# Patient Record
Sex: Male | Born: 1955 | Race: White | Hispanic: No | Smoking: Former smoker
Health system: Southern US, Community
[De-identification: ages and names within clinical notes are randomized; demographics above are authoritative.]

## PROBLEM LIST (undated history)

## (undated) DIAGNOSIS — F419 Anxiety disorder, unspecified: Secondary | ICD-10-CM

## (undated) DIAGNOSIS — R0600 Dyspnea, unspecified: Secondary | ICD-10-CM

## (undated) DIAGNOSIS — I10 Essential (primary) hypertension: Secondary | ICD-10-CM

## (undated) DIAGNOSIS — J449 Chronic obstructive pulmonary disease, unspecified: Secondary | ICD-10-CM

## (undated) DIAGNOSIS — F32A Depression, unspecified: Secondary | ICD-10-CM

## (undated) HISTORY — DX: Essential (primary) hypertension: I10

## (undated) HISTORY — DX: Chronic obstructive pulmonary disease, unspecified: J44.9

## (undated) HISTORY — PX: NO PAST SURGERIES: SHX2092

## (undated) HISTORY — PX: MRI: SHX5353

---

## 2021-05-08 DIAGNOSIS — F1020 Alcohol dependence, uncomplicated: Secondary | ICD-10-CM | POA: Insufficient documentation

## 2021-05-08 DIAGNOSIS — R0600 Dyspnea, unspecified: Secondary | ICD-10-CM | POA: Insufficient documentation

## 2022-01-06 ENCOUNTER — Other Ambulatory Visit: Payer: Self-pay

## 2022-01-06 ENCOUNTER — Emergency Department (HOSPITAL_COMMUNITY)
Admission: EM | Admit: 2022-01-06 | Discharge: 2022-01-06 | Disposition: A | Discharge status: Home / Self Care | Payer: Medicare Other | Attending: Emergency Medicine | Admitting: Emergency Medicine

## 2022-01-06 ENCOUNTER — Emergency Department (HOSPITAL_COMMUNITY): Payer: Medicare Other

## 2022-01-06 ENCOUNTER — Encounter: Payer: Self-pay | Admitting: *Deleted

## 2022-01-06 DIAGNOSIS — F17210 Nicotine dependence, cigarettes, uncomplicated: Secondary | ICD-10-CM | POA: Insufficient documentation

## 2022-01-06 DIAGNOSIS — Z7951 Long term (current) use of inhaled steroids: Secondary | ICD-10-CM | POA: Diagnosis not present

## 2022-01-06 DIAGNOSIS — Z20822 Contact with and (suspected) exposure to covid-19: Secondary | ICD-10-CM | POA: Insufficient documentation

## 2022-01-06 DIAGNOSIS — R0602 Shortness of breath: Secondary | ICD-10-CM | POA: Diagnosis present

## 2022-01-06 DIAGNOSIS — R911 Solitary pulmonary nodule: Secondary | ICD-10-CM | POA: Insufficient documentation

## 2022-01-06 DIAGNOSIS — J441 Chronic obstructive pulmonary disease with (acute) exacerbation: Secondary | ICD-10-CM | POA: Insufficient documentation

## 2022-01-06 LAB — CBC WITH DIFFERENTIAL/PLATELET
Abs Immature Granulocytes: 0.09 10*3/uL — ABNORMAL HIGH (ref 0.00–0.07)
Basophils Absolute: 0 10*3/uL (ref 0.0–0.1)
Basophils Relative: 0 %
Eosinophils Absolute: 0.1 10*3/uL (ref 0.0–0.5)
Eosinophils Relative: 1 %
HCT: 44.5 % (ref 39.0–52.0)
Hemoglobin: 14.3 g/dL (ref 13.0–17.0)
Immature Granulocytes: 1 %
Lymphocytes Relative: 19 %
Lymphs Abs: 2.1 10*3/uL (ref 0.7–4.0)
MCH: 29.7 pg (ref 26.0–34.0)
MCHC: 32.1 g/dL (ref 30.0–36.0)
MCV: 92.5 fL (ref 80.0–100.0)
Monocytes Absolute: 0.7 10*3/uL (ref 0.1–1.0)
Monocytes Relative: 7 %
Neutro Abs: 8.1 10*3/uL — ABNORMAL HIGH (ref 1.7–7.7)
Neutrophils Relative %: 72 %
Platelets: 252 10*3/uL (ref 150–400)
RBC: 4.81 MIL/uL (ref 4.22–5.81)
RDW: 19.5 % — ABNORMAL HIGH (ref 11.5–15.5)
WBC: 11.1 10*3/uL — ABNORMAL HIGH (ref 4.0–10.5)
nRBC: 0 % (ref 0.0–0.2)

## 2022-01-06 LAB — COMPREHENSIVE METABOLIC PANEL
ALT: 26 U/L (ref 0–44)
AST: 51 U/L — ABNORMAL HIGH (ref 15–41)
Albumin: 4 g/dL (ref 3.5–5.0)
Alkaline Phosphatase: 67 U/L (ref 38–126)
Anion gap: 9 (ref 5–15)
BUN: 12 mg/dL (ref 8–23)
CO2: 22 mmol/L (ref 22–32)
Calcium: 8.6 mg/dL — ABNORMAL LOW (ref 8.9–10.3)
Chloride: 101 mmol/L (ref 98–111)
Creatinine, Ser: 0.79 mg/dL (ref 0.61–1.24)
GFR, Estimated: >60 mL/min (ref >60)
Glucose, Bld: 178 mg/dL — ABNORMAL HIGH (ref 70–99)
Potassium: 4.6 mmol/L (ref 3.5–5.1)
Sodium: 132 mmol/L — ABNORMAL LOW (ref 135–145)
Total Bilirubin: 0.7 mg/dL (ref 0.3–1.2)
Total Protein: 7.5 g/dL (ref 6.5–8.1)

## 2022-01-06 LAB — RESP PANEL BY RT-PCR (FLU A&B, COVID) ARPGX2
Influenza A by PCR: NEGATIVE
Influenza B by PCR: NEGATIVE
SARS Coronavirus 2 by RT PCR: NEGATIVE

## 2022-01-06 LAB — D-DIMER, QUANTITATIVE: D-Dimer, Quant: 1.1 ug/mL-FEU — ABNORMAL HIGH (ref 0.00–0.50)

## 2022-01-06 MED ORDER — PREDNISONE 10 MG PO TABS: 40.0000 mg | ORAL_TABLET | Freq: Every day | ORAL | 0 refills | Status: AC

## 2022-01-06 MED ORDER — PREDNISONE 10 MG PO TABS: 50.0000 mg | ORAL_TABLET | Freq: Every day | ORAL | 0 refills | Status: DC

## 2022-01-06 MED ORDER — IOHEXOL 350 MG/ML SOLN
70.0000 mL | Freq: Once | INTRAVENOUS | Status: AC | PRN
  Administered 2022-01-06: 70 mL via INTRAVENOUS

## 2022-01-06 MED ORDER — IPRATROPIUM-ALBUTEROL 0.5-2.5 (3) MG/3ML IN SOLN: 3.0000 mL | Freq: Once | RESPIRATORY_TRACT | Status: DC

## 2022-01-06 NOTE — ED Triage Notes (Signed)
Reports SOB for past 2 hrs. No relief from home breathing treatments. Tachypnea 40-50/min with EMS. Improves with PTA treatment, neb, solumedrol, mag.

## 2022-01-06 NOTE — ED Notes (Signed)
Pt transitioned from bipap to 3L [place] successfully. Pt able to speak in full sentences and denies distress.   Pt accompanied by RN to CT and back to room without incident.

## 2022-01-06 NOTE — ED Notes (Signed)
Daughter type [place] [phone] would like an update

## 2022-01-06 NOTE — Discharge Instructions (Addendum)
I am prescribing you a strong steroid medication called prednisone.  Please only take this as prescribed.  Please take it early in the morning, as this medication can be stimulating and make it difficult to sleep at night.  We saw a new pulmonary nodule on your CT scan.  You need to follow-up with oncology regarding this for reevaluation and further testing.  Below is the contact information for Dr. Alvy Bimler with our oncology team.  Please give them a call immediately and schedule an appointment for reevaluation.  Please follow-up with your regular doctor regarding your visit today.  If you develop any new or worsening symptoms please come back to the emergency department immediately.

## 2022-01-06 NOTE — ED Provider Notes (Signed)
[facility] EMERGENCY DEPARTMENT Provider Note   CSN: [id number] Arrival date & time: 01/06/22  4496     History  Chief Complaint  Patient presents with   Shortness of Breath    Mark Yu is a 66 y.o. male.  HPI Patient is a 66 year old male with a history of COPD who presents to the emergency department due to shortness of breath.  Patient states that he has been experiencing increasing shortness of breath over the past 2 days.  Tonight it began to worsen so he called EMS.  Denies any chest pain or abdominal pain.  No cough, rhinorrhea, sore throat, nausea, vomiting, diarrhea.  States that he still smokes about 2 to 3 cigarettes/day.  Per EMS, patient was desaturating to about 90% on room air.  He was treated with multiple nebulizers, Solu-Medrol, as well as IV magnesium.  Symptoms have improved mildly.    Home Medications Prior to Admission medications   Medication Sig Start Date End Date Taking? Authorizing Provider  albuterol (VENTOLIN HFA) 108 (90 Base) MCG/ACT inhaler Inhale 2 puffs into the lungs every 4 (four) hours as needed for wheezing or shortness of breath.   Yes [provider]  amLODipine (NORVASC) 5 MG tablet Take 5 mg by mouth daily. 12/28/21  Yes [provider]  BREZTRI AEROSPHERE 160-9-4.8 MCG/ACT AERO Inhale 2 puffs into the lungs in the morning and at bedtime. 01/02/22  Yes [provider]  Camphor-Menthol-Methyl Sal (SALONPAS EX) Apply 1 patch topically as needed (rib pain).   Yes [provider]  cholecalciferol (VITAMIN D) 25 MCG (1000 UNIT) tablet Take 1,000 Units by mouth daily.   Yes [provider]  Dextromethorphan-guaiFENesin (MUCINEX DM PO) Take 1 tablet by mouth 2 (two) times daily as needed (cough).   Yes [provider]  fluticasone (FLONASE) 50 MCG/ACT nasal spray Place 2 sprays into both nostrils daily as needed for allergies or rhinitis.   Yes [provider]  folic  acid (FOLVITE) 759 MCG tablet Take 400 mcg by mouth daily.   Yes [provider]  ibuprofen (ADVIL) 200 MG tablet Take 600 mg by mouth every 6 (six) hours as needed for moderate pain or headache.   Yes [provider]  Magnesium 500 MG TABS Take 500 mg by mouth daily.   Yes [provider]  [place] (OYSTER CALCIUM) 500 MG TABS tablet Take 500 mg of elemental calcium by mouth daily.   Yes [provider]  vitamin B-12 (CYANOCOBALAMIN) 1000 MCG tablet Take 1,000 mcg by mouth daily.   Yes [provider]  predniSONE (DELTASONE) 10 MG tablet Take 4 tablets (40 mg total) by mouth daily with breakfast for 5 days. 01/06/22 01/11/22  Rayna Sexton, PA-C      Allergies    Amoxicillin    Review of Systems   Review of Systems  All other systems reviewed and are negative. Ten systems reviewed and are negative for acute change, except as noted in the HPI.   Physical Exam Updated Vital Signs BP 116/88    Pulse 93    Temp 97.8 F (36.6 C) (Oral)    Resp 14    Ht 5\' 11"  (1.803 m)    Wt 51.7 kg    SpO2 96%    BMI 15.90 kg/m  Physical Exam Vitals and nursing note reviewed.  Constitutional:      General: He is not in acute distress.    Appearance: Normal appearance. He is not  ill-appearing, toxic-appearing or diaphoretic.     Interventions: He is not intubated. HENT:     Head: Normocephalic and atraumatic.     Right Ear: External ear normal.     Left Ear: External ear normal.     Nose: Nose normal.     Mouth/Throat:     Mouth: Mucous membranes are moist.     Pharynx: Oropharynx is clear. No oropharyngeal exudate or posterior oropharyngeal erythema.  Eyes:     Extraocular Movements: Extraocular movements intact.  Cardiovascular:     Rate and Rhythm: Normal rate and regular rhythm.     Pulses: Normal pulses.     Heart sounds: Normal heart sounds. No murmur heard.   No friction rub. No gallop.  Pulmonary:     Effort: Pulmonary effort is normal.  Tachypnea present. No bradypnea, accessory muscle usage or respiratory distress. He is not intubated.     Breath sounds: No stridor. Examination of the right-lower field reveals decreased breath sounds and wheezing. Examination of the left-lower field reveals decreased breath sounds and wheezing. Decreased breath sounds and wheezing present. No rhonchi or rales.  Abdominal:     General: Abdomen is flat.     Palpations: Abdomen is soft.     Tenderness: There is no abdominal tenderness.     Comments: Abdomen is soft and nontender.  Musculoskeletal:        General: Normal range of motion.     Cervical back: Normal range of motion and neck supple. No tenderness.     Right lower leg: No tenderness. No edema.     Left lower leg: No tenderness. No edema.     Comments: No pedal edema.  Skin:    General: Skin is warm and dry.  Neurological:     General: No focal deficit present.     Mental Status: He is alert and oriented to person, place, and time.  Psychiatric:        Mood and Affect: Mood normal.        Behavior: Behavior normal.   ED Results / Procedures / Treatments   Labs (all labs ordered are listed, but only abnormal results are displayed) Labs Reviewed  COMPREHENSIVE METABOLIC PANEL - Abnormal; Notable for the following components:      Result Value   Sodium 132 (*)    Glucose, Bld 178 (*)    Calcium 8.6 (*)    AST 51 (*)    All other components within normal limits  CBC WITH DIFFERENTIAL/PLATELET - Abnormal; Notable for the following components:   WBC 11.1 (*)    RDW 19.5 (*)    Neutro Abs 8.1 (*)    Abs Immature Granulocytes 0.09 (*)    All other components within normal limits  D-DIMER, QUANTITATIVE - Abnormal; Notable for the following components:   D-Dimer, Quant 1.10 (*)    All other components within normal limits  RESP PANEL BY RT-PCR (FLU A&B, COVID) [id number]    EKG EKG Interpretation  Date/Time:  Tuesday January 06 2022 02:28:44 EST Ventricular Rate:  131 PR  Interval:  146 QRS Duration: 93 QT Interval:  349 QTC Calculation: 516 R Axis:   96 Text Interpretation: Sinus tachycardia LAE, consider biatrial enlargement Nonspecific T abnormalities, lateral leads ST elevation, consider anterior injury Prolonged QT interval No prior ECG before today in system. No STEMI Confirmed by Antony Blackbird [id number]) on 01/06/2022 3:15:00 AM  Radiology CT Angio Chest PE W and/or Wo Contrast  Result Date: 01/06/2022 CLINICAL  DATA:  66 year old male with clinical suspicion (high probability) of pulmonary embolism. EXAM: CT ANGIOGRAPHY CHEST WITH CONTRAST TECHNIQUE: Multidetector CT imaging of the chest was performed using the standard protocol during bolus administration of intravenous contrast. Multiplanar CT image reconstructions and MIPs were obtained to evaluate the vascular anatomy. CONTRAST:  80mL OMNIPAQUE IOHEXOL 350 MG/ML SOLN COMPARISON:  No priors. FINDINGS: Cardiovascular: There are no filling defects within the pulmonary arterial tree to suggest pulmonary embolism. Heart size is normal. There is no significant pericardial fluid, thickening or pericardial calcification. There is aortic atherosclerosis, as well as atherosclerosis of the great vessels of the mediastinum and the coronary arteries, including calcified atherosclerotic plaque in the left anterior descending, left circumflex and right coronary arteries. Mediastinum/Nodes: Mildly enlarged left hilar lymph node measuring up to 1.5 cm in short axis (axial image 73 of series 7). No other definite right hilar or mediastinal lymphadenopathy is noted. Esophagus is unremarkable in appearance. No axillary lymphadenopathy. Lungs/Pleura: Irregularly-shaped macrolobulated left upper lobe pulmonary nodule measuring up to 2.2 x 1.3 x 0.9 cm (axial image 72 of series 8 and coronal image 48 of series 10), concerning for neoplasm. A smoothly marginated ovoid shaped nodule in the medial aspect of the right upper lobe (axial image  52 of series 8) measuring 9 x 8 mm is also noted. No acute consolidative airspace disease. No pleural effusions. Diffuse bronchial wall thickening with moderate centrilobular and paraseptal emphysema. Upper Abdomen: Aortic atherosclerosis. Musculoskeletal: There are no aggressive appearing lytic or blastic lesions noted in the visualized portions of the skeleton. Review of the MIP images confirms the above findings. IMPRESSION: 1. No evidence of pulmonary embolism. 2. Pulmonary nodules in the lungs bilaterally, most concerning of which is a left upper lobe pulmonary nodule measuring 2.2 x 1.3 x 0.9 cm associated with left hilar lymphadenopathy. Findings are concerning for probable neoplasm. Further evaluation with nonemergent PET-CT is recommended in the near future to provide additional diagnostic and staging information. 3. Aortic atherosclerosis, in addition to three-vessel coronary artery disease. Please note that although the presence of coronary artery calcium documents the presence of coronary artery disease, the severity of this disease and any potential stenosis cannot be assessed on this non-gated CT examination. Assessment for potential risk factor modification, dietary therapy or pharmacologic therapy may be warranted, if clinically indicated. 4. Diffuse bronchial wall thickening with moderate centrilobular and paraseptal emphysema; imaging findings suggestive of underlying COPD. Aortic Atherosclerosis (ICD10-I70.0) and Emphysema (ICD10-J43.9). Electronically Signed   By: Vinnie Langton M.D.   On: 01/06/2022 05:14   DG Chest Portable 1 View  Result Date: 01/06/2022 CLINICAL DATA:  Shortness of breath. EXAM: PORTABLE CHEST 1 VIEW COMPARISON:  None. FINDINGS: The lungs are moderately emphysematous but clear. No pleural effusion is evident. Heart size and vasculature are normal apart from calcifications in the aortic arch. The mediastinum is normally outlined. Generalized osteopenia. IMPRESSION: No  evidence of acute chest disease.  COPD.  Aortic atherosclerosis. Electronically Signed   By: Telford Nab M.D.   On: 01/06/2022 03:06    Procedures Procedures   Medications Ordered in ED Medications  ipratropium-albuterol (DUONEB) 0.5-2.5 (3) MG/3ML nebulizer solution 3 mL (3 mLs Nebulization Not Given 01/06/22 0617)  iohexol (OMNIPAQUE) 350 MG/ML injection 70 mL (70 mLs Intravenous Contrast Given 01/06/22 0439)   ED Course/ Medical Decision Making/ A&P Clinical Course as of 01/06/22 1610  Tue Jan 06, 2022  0319 Patient reports improvement in his symptoms with BiPAP.  Oxygen saturations at 100%.  Wheezing appears to improved.  Patient getting better air movement.  Patient with persistent tachycardia.  Likely due to his recent breathing treatments but given his hypoxia as well as tachycardia, will obtain a D-dimer.  Discussed with the patient and he is amenable. [LJ]  [id number] Patient taken off BiPAP.  Oxygen saturations in the high 90s on 3 L via nasal cannula.  [LJ]    Clinical Course User Index [LJ] Rayna Sexton, PA-C                           Medical Decision Making Pt is a 66 y.o. male with a history of COPD presents to the emergency department due to shortness of breath.  Labs: CBC with a white count of 11.1, RDW 19.5, neutrophils of 8.1. CMP with sodium 132, glucose 178, calcium of 8.6, AST of 51. Respiratory panel is negative. D-dimer 1.1.  Imaging: CT of the chest obtained with findings showing  IMPRESSION: 1. No evidence of pulmonary embolism. 2. Pulmonary nodules in the lungs bilaterally, most concerning of which is a left upper lobe pulmonary nodule measuring 2.2 x 1.3 x 0.9 cm associated with left hilar lymphadenopathy. Findings are concerning for probable neoplasm. Further evaluation with nonemergent PET-CT is recommended in the near future to provide additional diagnostic and staging information. 3. Aortic atherosclerosis, in addition to three-vessel coronary artery  disease. Please note that although the presence of coronary artery calcium documents the presence of coronary artery disease, the severity of this disease and any potential stenosis cannot be assessed on this non-gated CT examination. Assessment for potential risk factor modification, dietary therapy or pharmacologic therapy may be warranted, if clinically indicated. 4. Diffuse bronchial wall thickening with moderate centrilobular and paraseptal emphysema; imaging findings suggestive of underlying COPD. Aortic Atherosclerosis (ICD10-I70.0) and Emphysema (ICD10-J43.9). Electronically Signed   By: Vinnie Langton M.D.   On: 01/06/2022 05:14  Chest x-ray shows no evidence of acute chest disease.  COPD.  Aortic atherosclerosis.  I, Rayna Sexton, PA-C, personally reviewed and evaluated these images and lab results as part of my medical decision-making.  Patient given IV magnesium, Solu-Medrol, as well as multiple duo nebs in route.  Patient had moderate improvement in his symptoms.  He was briefly transitioned to BiPAP with improvement in his breathing.  He was then transitioned off BiPAP back to nasal cannula and has since been taken off oxygen.  Patient persistently tachycardic so D-dimer was obtained which was elevated at 1.1.  I then ordered a CTA of the chest with findings as noted above.  This is negative for PE.  This is also concerning for multiple pulmonary nodules with a notable pulmonary nodule in the left upper lobe.  There is surrounding hilar lymphadenopathy and is concerning for a probable neoplasm.  This was discussed with the patient at length.  Although he has cut back on his smoking he is currently still smoking about 1 pack/week.  He notes a significant worsening in his shortness of breath over the past year.  Reports a 6 pound weight loss in the past 3 months.  Patient given a referral to oncology and I recommended that he reach out to them as soon as possible to schedule an appointment for  reevaluation.  He verbalized understanding.  Patient ambulated with a pulse ox and saturations remained above 95%.  He notes significant improvement in his symptoms.  Feel that he is stable for discharge at this time and he is  agreeable.  Will discharge on a prednisone burst.  He was given very strict return precautions and knows to return to the emergency department with any new or worsening symptoms.  His questions were answered and he was amicable at the time of discharge.  Note: Portions of this report may have been transcribed using voice recognition software. Every effort was made to ensure accuracy; however, inadvertent computerized transcription errors may be present.   Final Clinical Impression(s) / ED Diagnoses Final diagnoses:  COPD exacerbation ([place])  Pulmonary nodule   Rx / DC Orders ED Discharge Orders          Ordered    predniSONE (DELTASONE) 10 MG tablet  Daily with breakfast,   Status:  Discontinued        01/06/22 0618    predniSONE (DELTASONE) 10 MG tablet  Daily with breakfast        01/06/22 0619              Rayna Sexton, PA-C 01/06/22 4383    Tegeler, Gwenyth Allegra, MD 01/06/22 [phone]

## 2022-01-06 NOTE — ED Notes (Signed)
Ambulated patient down hallway with O2 sats averaging 96% during activity.

## 2022-01-07 ENCOUNTER — Telehealth: Payer: Self-pay | Admitting: Internal Medicine

## 2022-01-07 NOTE — Telephone Encounter (Signed)
Spoke to patient to confirm [facility] appointment for 1/12

## 2022-01-08 ENCOUNTER — Other Ambulatory Visit: Payer: Self-pay | Admitting: *Deleted

## 2022-01-08 ENCOUNTER — Encounter: Payer: Self-pay | Admitting: Internal Medicine

## 2022-01-08 ENCOUNTER — Inpatient Hospital Stay (HOSPITAL_BASED_OUTPATIENT_CLINIC_OR_DEPARTMENT_OTHER): Payer: Medicare Other | Admitting: Internal Medicine

## 2022-01-08 ENCOUNTER — Other Ambulatory Visit: Payer: Self-pay

## 2022-01-08 ENCOUNTER — Inpatient Hospital Stay: Payer: Medicare Other | Attending: Internal Medicine

## 2022-01-08 VITALS — BP 136/96 | HR 109 | Temp 97.5°F | Resp 19 | Ht 71.0 in | Wt 111.8 lb

## 2022-01-08 DIAGNOSIS — C3490 Malignant neoplasm of unspecified part of unspecified bronchus or lung: Secondary | ICD-10-CM

## 2022-01-08 DIAGNOSIS — Z79899 Other long term (current) drug therapy: Secondary | ICD-10-CM | POA: Diagnosis not present

## 2022-01-08 DIAGNOSIS — F17210 Nicotine dependence, cigarettes, uncomplicated: Secondary | ICD-10-CM

## 2022-01-08 DIAGNOSIS — R634 Abnormal weight loss: Secondary | ICD-10-CM

## 2022-01-08 DIAGNOSIS — Z7952 Long term (current) use of systemic steroids: Secondary | ICD-10-CM | POA: Insufficient documentation

## 2022-01-08 DIAGNOSIS — F419 Anxiety disorder, unspecified: Secondary | ICD-10-CM

## 2022-01-08 DIAGNOSIS — R918 Other nonspecific abnormal finding of lung field: Secondary | ICD-10-CM

## 2022-01-08 DIAGNOSIS — I10 Essential (primary) hypertension: Secondary | ICD-10-CM | POA: Diagnosis not present

## 2022-01-08 DIAGNOSIS — R911 Solitary pulmonary nodule: Secondary | ICD-10-CM

## 2022-01-08 DIAGNOSIS — C3411 Malignant neoplasm of upper lobe, right bronchus or lung: Secondary | ICD-10-CM | POA: Diagnosis present

## 2022-01-08 LAB — CBC WITH DIFFERENTIAL (CANCER CENTER ONLY)
Abs Immature Granulocytes: 0.02 10*3/uL (ref 0.00–0.07)
Basophils Absolute: 0 10*3/uL (ref 0.0–0.1)
Basophils Relative: 0 %
Eosinophils Absolute: 0.1 10*3/uL (ref 0.0–0.5)
Eosinophils Relative: 1 %
HCT: 38.8 % — ABNORMAL LOW (ref 39.0–52.0)
Hemoglobin: 12.8 g/dL — ABNORMAL LOW (ref 13.0–17.0)
Immature Granulocytes: 1 %
Lymphocytes Relative: 17 %
Lymphs Abs: 0.7 10*3/uL (ref 0.7–4.0)
MCH: 29 pg (ref 26.0–34.0)
MCHC: 33 g/dL (ref 30.0–36.0)
MCV: 88 fL (ref 80.0–100.0)
Monocytes Absolute: 0.3 10*3/uL (ref 0.1–1.0)
Monocytes Relative: 7 %
Neutro Abs: 2.9 10*3/uL (ref 1.7–7.7)
Neutrophils Relative %: 74 %
Platelet Count: 271 10*3/uL (ref 150–400)
RBC: 4.41 MIL/uL (ref 4.22–5.81)
RDW: 19.3 % — ABNORMAL HIGH (ref 11.5–15.5)
WBC Count: 3.9 10*3/uL — ABNORMAL LOW (ref 4.0–10.5)
nRBC: 0 % (ref 0.0–0.2)

## 2022-01-08 LAB — CMP (CANCER CENTER ONLY)
ALT: 21 U/L (ref 0–44)
AST: 27 U/L (ref 15–41)
Albumin: 4.4 g/dL (ref 3.5–5.0)
Alkaline Phosphatase: 56 U/L (ref 38–126)
Anion gap: 9 (ref 5–15)
BUN: 19 mg/dL (ref 8–23)
CO2: 27 mmol/L (ref 22–32)
Calcium: 9.2 mg/dL (ref 8.9–10.3)
Chloride: 98 mmol/L (ref 98–111)
Creatinine: 0.79 mg/dL (ref 0.61–1.24)
GFR, Estimated: >60 mL/min (ref >60)
Glucose, Bld: 99 mg/dL (ref 70–99)
Potassium: 4.7 mmol/L (ref 3.5–5.1)
Sodium: 134 mmol/L — ABNORMAL LOW (ref 135–145)
Total Bilirubin: 0.6 mg/dL (ref 0.3–1.2)
Total Protein: 7.6 g/dL (ref 6.5–8.1)

## 2022-01-08 MED ORDER — METHYLPREDNISOLONE 4 MG PO TBPK: ORAL_TABLET | ORAL | 0 refills | Status: DC

## 2022-01-08 MED ORDER — ALPRAZOLAM 0.25 MG PO TABS: 0.2500 mg | ORAL_TABLET | Freq: Every day | ORAL | 0 refills | Status: DC

## 2022-01-08 NOTE — Progress Notes (Signed)
[place] [phone]   [phone]  CONSULT NOTE  REFERRING PHYSICIAN: Rayna Sexton, PA-C  REASON FOR CONSULTATION:  66 years old white male with highly suspicious lung cancer  HPI Mark Yu is a 66 y.o. male with past medical history significant for COPD and hypertension.  The patient also has a long history of smoking.  He has been followed by primary care physician for management of his COPD and treated with different inhalers.  Over the last few weeks he has a problem with shortness of breath as well as inability to clear his thick mucus.  He presented to the emergency department on January 06, 2022 complaining of shortness of breath and chest tightness.  Had a chest x-ray followed by CT angiogram of the chest on January 06, 2022 and it showed irregularly shaped macrolobulated left upper lobe pulmonary nodule measuring 2.2 x 1.3 x 0.9 cm concerning for neoplasm.  There was also a smoothly marginated ovoid shaped nodule in the medial aspect of the right upper lobe measuring 0.9 x 0.8 cm the scan also showed mildly enlarged left hilar lymph node measuring up to 1.5 cm and no other definite right hilar or mediastinal lymphadenopathy. The patient was referred to me today for evaluation and recommendation regarding treatment of his condition.  He continues to complain of lack of appetite as well as lack of taste and few pounds of weight loss over the last 2 months.  He has cough productive of thick mucus as well as shortness of breath but no significant chest pain or hemoptysis.  He has no nausea, vomiting, diarrhea or constipation.  He has no headache or visual changes. Family history significant for mother with Alzheimer's and father with MS. The patient is single and has a significant other.  He works as Scientist, research (medical) at [employer].  He was accompanied today by the daughter of his girlfriend Mark Yu, a registered nurse at  [employer].  The patient has a history of smoking up to 1.5 pack/day for around 50 years.  He still smokes few cigarettes every day.  He also drinks around 4 beers every day.  He has history of marijuana use.  HPI  Past Medical History:  Diagnosis Date   COPD (chronic obstructive pulmonary disease) (HCC)    HTN (hypertension)     History reviewed. No pertinent surgical history.  Family History  Problem Relation Age of Onset   Alzheimer's disease Mother    Multiple sclerosis Father     Social History    Allergies  Allergen Reactions   Amoxicillin Rash    Current Outpatient Medications  Medication Sig Dispense Refill   albuterol (VENTOLIN HFA) 108 (90 Base) MCG/ACT inhaler Inhale 2 puffs into the lungs every 4 (four) hours as needed for wheezing or shortness of breath.     amLODipine (NORVASC) 5 MG tablet Take 5 mg by mouth daily.     BREZTRI AEROSPHERE 160-9-4.8 MCG/ACT AERO Inhale 2 puffs into the lungs in the morning and at bedtime.     Camphor-Menthol-Methyl Sal (SALONPAS EX) Apply 1 patch topically as needed (rib pain).     cholecalciferol (VITAMIN D) 25 MCG (1000 UNIT) tablet Take 1,000 Units by mouth daily.     Dextromethorphan-guaiFENesin (MUCINEX DM PO) Take 1 tablet by mouth 2 (two) times daily as needed (cough).     fluticasone (FLONASE) 50 MCG/ACT nasal spray Place 2 sprays into both nostrils daily  as needed for allergies or rhinitis.     folic acid (FOLVITE) 443 MCG tablet Take 400 mcg by mouth daily.     ibuprofen (ADVIL) 200 MG tablet Take 600 mg by mouth every 6 (six) hours as needed for moderate pain or headache.     Magnesium 500 MG TABS Take 500 mg by mouth daily.     Oyster Shell (OYSTER CALCIUM) 500 MG TABS tablet Take 500 mg of elemental calcium by mouth daily.     predniSONE (DELTASONE) 10 MG tablet Take 4 tablets (40 mg total) by mouth daily with breakfast for 5 days. 20 tablet 0   vitamin B-12 (CYANOCOBALAMIN) 1000 MCG tablet Take 1,000 mcg by  mouth daily.     No current facility-administered medications for this visit.    Review of Systems  Constitutional: positive for anorexia, fatigue, and weight loss Eyes: negative Ears, nose, mouth, throat, and face: negative Respiratory: positive for cough and dyspnea on exertion Cardiovascular: negative Gastrointestinal: negative Genitourinary:negative Integument/breast: negative Hematologic/lymphatic: negative Musculoskeletal:negative Neurological: positive for headaches Behavioral/Psych: positive for anxiety Endocrine: negative Allergic/Immunologic: negative  Physical Exam  XVQ:MGQQP, healthy, no distress, well nourished, well developed, and anxious SKIN: skin color, texture, turgor are normal, no rashes or significant lesions HEAD: Normocephalic, No masses, lesions, tenderness or abnormalities EYES: normal, PERRLA, Conjunctiva are pink and non-injected EARS: External ears normal, Canals clear OROPHARYNX:no exudate, no erythema, and lips, buccal mucosa, and tongue normal  NECK: supple, no adenopathy, no JVD LYMPH:  no palpable lymphadenopathy, no hepatosplenomegaly LUNGS: expiratory wheezes bilaterally HEART: regular rate & rhythm, no murmurs, and no gallops ABDOMEN:abdomen soft, non-tender, and normal bowel sounds BACK: Back symmetric, no curvature., No CVA tenderness EXTREMITIES:no joint deformities, effusion, or inflammation, no edema  NEURO: alert & oriented x 3 with fluent speech, no focal motor/sensory deficits  PERFORMANCE STATUS: ECOG 1  LABORATORY DATA: Lab Results  Component Value Date   WBC 3.9 (L) 01/08/2022   HGB 12.8 (L) 01/08/2022   HCT 38.8 (L) 01/08/2022   MCV 88.0 01/08/2022   PLT 271 01/08/2022      Chemistry      Component Value Date/Time   NA 132 (L) 01/06/2022 0243   K 4.6 01/06/2022 0243   CL 101 01/06/2022 0243   CO2 22 01/06/2022 0243   BUN 12 01/06/2022 0243   CREATININE 0.79 01/06/2022 0243      Component Value Date/Time    CALCIUM 8.6 (L) 01/06/2022 0243   ALKPHOS 67 01/06/2022 0243   AST 51 (H) 01/06/2022 0243   ALT 26 01/06/2022 0243   BILITOT 0.7 01/06/2022 0243       RADIOGRAPHIC STUDIES: CT Angio Chest PE W and/or Wo Contrast  Result Date: 01/06/2022 CLINICAL DATA:  66 year old male with clinical suspicion (high probability) of pulmonary embolism. EXAM: CT ANGIOGRAPHY CHEST WITH CONTRAST TECHNIQUE: Multidetector CT imaging of the chest was performed using the standard protocol during bolus administration of intravenous contrast. Multiplanar CT image reconstructions and MIPs were obtained to evaluate the vascular anatomy. CONTRAST:  49mL OMNIPAQUE IOHEXOL 350 MG/ML SOLN COMPARISON:  No priors. FINDINGS: Cardiovascular: There are no filling defects within the pulmonary arterial tree to suggest pulmonary embolism. Heart size is normal. There is no significant pericardial fluid, thickening or pericardial calcification. There is aortic atherosclerosis, as well as atherosclerosis of the great vessels of the mediastinum and the coronary arteries, including calcified atherosclerotic plaque in the left anterior descending, left circumflex and right coronary arteries. Mediastinum/Nodes: Mildly enlarged left hilar lymph node  measuring up to 1.5 cm in short axis (axial image 73 of series 7). No other definite right hilar or mediastinal lymphadenopathy is noted. Esophagus is unremarkable in appearance. No axillary lymphadenopathy. Lungs/Pleura: Irregularly-shaped macrolobulated left upper lobe pulmonary nodule measuring up to 2.2 x 1.3 x 0.9 cm (axial image 72 of series 8 and coronal image 48 of series 10), concerning for neoplasm. A smoothly marginated ovoid shaped nodule in the medial aspect of the right upper lobe (axial image 52 of series 8) measuring 9 x 8 mm is also noted. No acute consolidative airspace disease. No pleural effusions. Diffuse bronchial wall thickening with moderate centrilobular and paraseptal emphysema.  Upper Abdomen: Aortic atherosclerosis. Musculoskeletal: There are no aggressive appearing lytic or blastic lesions noted in the visualized portions of the skeleton. Review of the MIP images confirms the above findings. IMPRESSION: 1. No evidence of pulmonary embolism. 2. Pulmonary nodules in the lungs bilaterally, most concerning of which is a left upper lobe pulmonary nodule measuring 2.2 x 1.3 x 0.9 cm associated with left hilar lymphadenopathy. Findings are concerning for probable neoplasm. Further evaluation with nonemergent PET-CT is recommended in the near future to provide additional diagnostic and staging information. 3. Aortic atherosclerosis, in addition to three-vessel coronary artery disease. Please note that although the presence of coronary artery calcium documents the presence of coronary artery disease, the severity of this disease and any potential stenosis cannot be assessed on this non-gated CT examination. Assessment for potential risk factor modification, dietary therapy or pharmacologic therapy may be warranted, if clinically indicated. 4. Diffuse bronchial wall thickening with moderate centrilobular and paraseptal emphysema; imaging findings suggestive of underlying COPD. Aortic Atherosclerosis (ICD10-I70.0) and Emphysema (ICD10-J43.9). Electronically Signed   By: Vinnie Langton M.D.   On: 01/06/2022 05:14   DG Chest Portable 1 View  Result Date: 01/06/2022 CLINICAL DATA:  Shortness of breath. EXAM: PORTABLE CHEST 1 VIEW COMPARISON:  None. FINDINGS: The lungs are moderately emphysematous but clear. No pleural effusion is evident. Heart size and vasculature are normal apart from calcifications in the aortic arch. The mediastinum is normally outlined. Generalized osteopenia. IMPRESSION: No evidence of acute chest disease.  COPD.  Aortic atherosclerosis. Electronically Signed   By: Telford Nab M.D.   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   PLAN: I had a lengthy discussion with the patient and his family member today about his current condition and further investigation to confirm his diagnosis. I recommended for the patient to have a PET scan as well as MRI of the brain to rule out brain metastasis. I will refer the patient to pulmonary medicine for consideration of bronchoscopy with EBUS and biopsy of the left upper lobe as well as the right upper lobe lung nodules for confirmation of tissue diagnosis. I strongly encouraged the patient to quit smoking. I will see him back for follow-up visit in around 2-3 weeks for evaluation and more detailed discussion of his treatment options based on the final pathology and staging work-up. For the anxiety, I gave the patient 10 tablets of Xanax 0.  2 5 mg to be used only on as-needed basis. For the lack of appetite and weight loss, I gave him Medrol Dosepak. The patient was advised to call immediately if he has  any other concerning symptoms in the interval The patient voices understanding of current disease status and treatment options and is in agreement with the current care plan.  All questions were answered. The patient knows to call the clinic with any problems, questions or concerns. We can certainly see the patient much sooner if necessary.  Thank you so much for allowing me to participate in the care of Mark Yu. I will continue to follow up the patient with you and assist in his care.  The total time spent in the appointment was 60 minutes.  Disclaimer: This note was dictated with voice recognition software. Similar sounding words can inadvertently be transcribed and may not be corrected upon review.   Eilleen Kempf January 08, 2022, 2:53 PM

## 2022-01-08 NOTE — Progress Notes (Signed)
The proposed treatment discussed in conference is for discussion purpose only and is not a binding recommendation. The patient was not been physically examined, or presented with their treatment options. Therefore, final treatment plans cannot be decided.  

## 2022-01-08 NOTE — Patient Instructions (Signed)
Steps to Quit Smoking Smoking tobacco is the leading cause of preventable death. It can affect almost every organ in the body. Smoking puts you and people around you at risk for many serious, long-lasting (chronic) diseases. Quitting smoking can be hard, but it is one of the best things that you can do for your health. It is never too late to quit. How do I get ready to quit? When you decide to quit smoking, make a plan to help you succeed. Before you quit: Pick a date to quit. Set a date within the next 2 weeks to give you time to prepare. Write down the reasons why you are quitting. Keep this list in places where you will see it often. Tell your family, friends, and co-workers that you are quitting. Their support is important. Talk with your doctor about the choices that may help you quit. Find out if your health insurance will pay for these treatments. Know the people, places, things, and activities that make you want to smoke (triggers). Avoid them. What first steps can I take to quit smoking? Throw away all cigarettes at home, at work, and in your car. Throw away the things that you use when you smoke, such as ashtrays and lighters. Clean your car. Make sure to empty the ashtray. Clean your home, including curtains and carpets. What can I do to help me quit smoking? Talk with your doctor about taking medicines and seeing a counselor at the same time. You are more likely to succeed when you do both. If you are pregnant or breastfeeding, talk with your doctor about counseling or other ways to quit smoking. Do not take medicine to help you quit smoking unless your doctor tells you to do so. To quit smoking: Quit right away Quit smoking totally, instead of slowly cutting back on how much you smoke over a period of time. Go to counseling. You are more likely to quit if you go to counseling sessions regularly. Take medicine You may take medicines to help you quit. Some medicines need a  prescription, and some you can buy over-the-counter. Some medicines may contain a drug called nicotine to replace the nicotine in cigarettes. Medicines may: Help you to stop having the desire to smoke (cravings). Help to stop the problems that come when you stop smoking (withdrawal symptoms). Your doctor may ask you to use: Nicotine patches, gum, or lozenges. Nicotine inhalers or sprays. Non-nicotine medicine that is taken by mouth. Find resources Find resources and other ways to help you quit smoking and remain smoke-free after you quit. These resources are most helpful when you use them often. They include: Online chats with a counselor. Phone quitlines. Printed self-help materials. Support groups or group counseling. Text messaging programs. Mobile phone apps. Use apps on your mobile phone or tablet that can help you stick to your quit plan. There are many free apps for mobile phones and tablets as well as websites. Examples include Quit Guide from the CDC and smokefree.gov  What things can I do to make it easier to quit?  Talk to your family and friends. Ask them to support and encourage you. Call a phone quitline (1-800-QUIT-NOW), reach out to support groups, or work with a counselor. Ask people who smoke to not smoke around you. Avoid places that make you want to smoke, such as: Bars. Parties. Smoke-break areas at work. Spend time with people who do not smoke. Lower the stress in your life. Stress can make you want to   smoke. Try these things to help your stress: Getting regular exercise. Doing deep-breathing exercises. Doing yoga. Meditating. Doing a body scan. To do this, close your eyes, focus on one area of your body at a time from head to toe. Notice which parts of your body are tense. Try to relax the muscles in those areas. How will I feel when I quit smoking? Day 1 to 3 weeks Within the first 24 hours, you may start to have some problems that come from quitting tobacco.  These problems are very bad 2-3 days after you quit, but they do not often last for more than 2-3 weeks. You may get these symptoms: Mood swings. Feeling restless, nervous, angry, or annoyed. Trouble concentrating. Dizziness. Strong desire for high-sugar foods and nicotine. Weight gain. Trouble pooping (constipation). Feeling like you may vomit (nausea). Coughing or a sore throat. Changes in how the medicines that you take for other issues work in your body. Depression. Trouble sleeping (insomnia). Week 3 and afterward After the first 2-3 weeks of quitting, you may start to notice more positive results, such as: Better sense of smell and taste. Less coughing and sore throat. Slower heart rate. Lower blood pressure. Clearer skin. Better breathing. Fewer sick days. Quitting smoking can be hard. Do not give up if you fail the first time. Some people need to try a few times before they succeed. Do your best to stick to your quit plan, and talk with your doctor if you have any questions or concerns. Summary Smoking tobacco is the leading cause of preventable death. Quitting smoking can be hard, but it is one of the best things that you can do for your health. When you decide to quit smoking, make a plan to help you succeed. Quit smoking right away, not slowly over a period of time. When you start quitting, seek help from your doctor, family, or friends. This information is not intended to replace advice given to you by your health care provider. Make sure you discuss any questions you have with your health care provider. Document Revised: 08/22/2021 Document Reviewed: 03/04/2019 Elsevier Patient Education  2022 Elsevier Inc.  

## 2022-01-09 ENCOUNTER — Encounter: Payer: Self-pay | Admitting: *Deleted

## 2022-01-09 NOTE — Progress Notes (Signed)
Oncology Nurse Navigator Documentation  Oncology Nurse Navigator Flowsheets 01/09/2022 01/06/2022  Abnormal Finding Date - 01/05/2022  Diagnosis Status - Additional Work Up  Navigator Follow Up Date: 01/12/2022 01/08/2022  Navigator Follow Up Reason: Appointment Review New Patient Appointment  Navigator Location [place]  Referral Date to RadOnc/MedOnc - 01/06/2022  Navigator Encounter Type Other: Telephone  Telephone - [place] Call  [facility] Date 01/08/2022 -  [facility] Type Thoracic -  Patient Visit Type Other Initial  Treatment Phase Abnormal Scans Abnormal Scans  Barriers/Navigation Needs Coordination of Care/I followed up on  Mark Yu's insurance authorization for his scans. They are still pending at this time. I contacted authorization team to help expedite.  Coordination of Care;Education  Education - Other  Interventions Coordination of Care Coordination of Care;Education;Psycho-Social Support  Acuity Level 2-Minimal Needs (1-2 Barriers Identified) Level 2-Minimal Needs (1-2 Barriers Identified)  Coordination of Care Other Appts  Education Method - Verbal  Time Spent with Patient 15 30

## 2022-01-13 ENCOUNTER — Telehealth: Payer: Self-pay | Admitting: Pulmonary Disease

## 2022-01-13 ENCOUNTER — Encounter: Payer: Self-pay | Admitting: *Deleted

## 2022-01-13 NOTE — Progress Notes (Signed)
Oncology Nurse Navigator Documentation  Oncology Nurse Navigator Flowsheets 01/13/2022 01/09/2022 01/06/2022  Abnormal Finding Date - - 01/05/2022  Diagnosis Status - - Additional Work Up  Navigator Follow Up Date: 01/16/2022 01/12/2022 01/08/2022  Navigator Follow Up Reason: Appointment Review Appointment Review New Patient Appointment  Navigator Location [place]  Referral Date to RadOnc/MedOnc - - 01/06/2022  Navigator Encounter Type Other: Other: Telephone  Telephone - - Outgoing Call  [facility] Date - 01/08/2022 -  [facility] Type - Thoracic -  Patient Visit Type - Other Initial  Treatment Phase Abnormal Scans Abnormal Scans Abnormal Scans  Barriers/Navigation Needs Coordination of Care/I followed up on patient appt with pulmonary and did not see an appt. I reached out to Dr. Valeta Harms to get an update. He was great to respond so quickly and will get patient in this week. I am so thankful for his help.  Coordination of Care Coordination of Care;Education  Education - - Other  Interventions Coordination of Care Coordination of Care Coordination of Care;Education;Psycho-Social Support  Acuity Level 2-Minimal Needs (1-2 Barriers Identified) Level 2-Minimal Needs (1-2 Barriers Identified) Level 2-Minimal Needs (1-2 Barriers Identified)  Coordination of Care Other Other Appts  Education Method - - Verbal  Time Spent with Patient 30 15 30

## 2022-01-13 NOTE — Telephone Encounter (Signed)
Left voicemail for patient to call back to schedule with Dr. Valeta Harms on 01/16/2022 at [facility] by Dr. Valeta Harms to double-book.

## 2022-01-13 NOTE — Telephone Encounter (Signed)
-----   Message from Garner Nash, DO sent at 01/13/2022 12:01 PM EST ----- Regarding: RE: new referral  Mel Almond,   Can you please find this referral and get on my schedule or robs? [place] So, please stick him on my schedule for this Friday if the patient can come in on Friday. Ok to double book my 9AM slot   Thanks  BLI     ----- Message ----- From: Valrie Hart, RN Sent: 01/13/2022  11:53 AM EST To: Garner Nash, DO Subject: new referral                                   Hey Dr. Valeta Harms- So sorry to bother you.  Dr. Julien Nordmann made a referral last week and patient has not heard any updates. Is there someone in the office I can reach out to?  Thanks Hinton Dyer

## 2022-01-14 ENCOUNTER — Encounter: Payer: Self-pay | Admitting: *Deleted

## 2022-01-14 NOTE — Progress Notes (Signed)
Oncology Nurse Navigator Documentation  Oncology Nurse Navigator Flowsheets 01/14/2022 01/13/2022 01/09/2022 01/06/2022  Abnormal Finding Date - - - 01/05/2022  Diagnosis Status - - - Additional Work Up  Navigator Follow Up Date: 01/23/2022 01/16/2022 01/12/2022 01/08/2022  Navigator Follow Up Reason: Radiology Appointment Review Appointment Review New Patient Appointment  Navigator Location [place]  Referral Date to RadOnc/MedOnc - - - 01/06/2022  Navigator Encounter Type Other: Other: Other: Telephone  Telephone - - - [place] Call  [facility] Date - - 01/08/2022 -  [facility] Type - - Thoracic -  Patient Visit Type - - Other Initial  Treatment Phase Abnormal Scans Abnormal Scans Abnormal Scans Abnormal Scans  Barriers/Navigation Needs Coordination of Care/I followed up on Mark Yu's schedule for his treatment plan. He is set up at this time.  Coordination of Care Coordination of Care Coordination of Care;Education  Education - - - Other  Interventions Coordination of Care Coordination of Care Coordination of Care Coordination of Care;Education;Psycho-Social Support  Acuity Level 2-Minimal Needs (1-2 Barriers Identified) Level 2-Minimal Needs (1-2 Barriers Identified) Level 2-Minimal Needs (1-2 Barriers Identified) Level 2-Minimal Needs (1-2 Barriers Identified)  Coordination of Care Other Other Other Appts  Education Method - - - Verbal  Time Spent with Patient 15 30 15  30

## 2022-01-14 NOTE — Telephone Encounter (Signed)
Patient scheduled with Dr. Lamonte Sakai on 01/22/2022.

## 2022-01-20 ENCOUNTER — Ambulatory Visit (HOSPITAL_COMMUNITY)
Admission: RE | Admit: 2022-01-20 | Discharge: 2022-01-20 | Disposition: A | Discharge status: Home / Self Care | Payer: Medicare Other | Source: Ambulatory Visit | Attending: Internal Medicine | Admitting: Internal Medicine

## 2022-01-20 ENCOUNTER — Other Ambulatory Visit: Payer: Self-pay

## 2022-01-20 DIAGNOSIS — C3490 Malignant neoplasm of unspecified part of unspecified bronchus or lung: Secondary | ICD-10-CM | POA: Insufficient documentation

## 2022-01-20 DIAGNOSIS — J432 Centrilobular emphysema: Secondary | ICD-10-CM | POA: Insufficient documentation

## 2022-01-20 LAB — GLUCOSE, CAPILLARY: Glucose-Capillary: 84 mg/dL (ref 70–99)

## 2022-01-20 MED ORDER — FLUDEOXYGLUCOSE F - 18 (FDG) INJECTION
5.5000 | Freq: Once | INTRAVENOUS | Status: AC | PRN
  Administered 2022-01-20: 14:00:00 6.37 via INTRAVENOUS

## 2022-01-20 MED ORDER — GADOBUTROL 1 MMOL/ML IV SOLN
5.0000 mL | Freq: Once | INTRAVENOUS | Status: AC | PRN
  Administered 2022-01-20: 16:00:00 5 mL via INTRAVENOUS

## 2022-01-22 ENCOUNTER — Encounter: Payer: Self-pay | Admitting: Emergency Medicine

## 2022-01-22 ENCOUNTER — Ambulatory Visit: Payer: Medicare Other | Admitting: Emergency Medicine

## 2022-01-22 ENCOUNTER — Other Ambulatory Visit: Payer: Self-pay

## 2022-01-22 ENCOUNTER — Telehealth: Payer: Self-pay | Admitting: Emergency Medicine

## 2022-01-22 VITALS — BP 142/86 | HR 117 | Temp 98.2°F | Ht 71.0 in | Wt 112.0 lb

## 2022-01-22 DIAGNOSIS — F419 Anxiety disorder, unspecified: Secondary | ICD-10-CM | POA: Diagnosis not present

## 2022-01-22 DIAGNOSIS — Z720 Tobacco use: Secondary | ICD-10-CM

## 2022-01-22 DIAGNOSIS — J449 Chronic obstructive pulmonary disease, unspecified: Secondary | ICD-10-CM | POA: Insufficient documentation

## 2022-01-22 DIAGNOSIS — J441 Chronic obstructive pulmonary disease with (acute) exacerbation: Secondary | ICD-10-CM | POA: Insufficient documentation

## 2022-01-22 DIAGNOSIS — R918 Other nonspecific abnormal finding of lung field: Secondary | ICD-10-CM | POA: Diagnosis not present

## 2022-01-22 MED ORDER — ALPRAZOLAM 1 MG PO TABS: 1.0000 mg | ORAL_TABLET | Freq: Every evening | ORAL | 0 refills | Status: DC | PRN

## 2022-01-22 NOTE — Assessment & Plan Note (Addendum)
Continue your Breztri 2 puffs twice a day.  Rinse and gargle after using. Keep your albuterol available to use 2 puffs when you needed for shortness of breath, chest tightness, wheezing. Follow with Dr Lamonte Sakai in 1 month

## 2022-01-22 NOTE — Assessment & Plan Note (Signed)
Work hard on decreasing your cigarettes.  Ultimate goal will be to stop altogether.

## 2022-01-22 NOTE — Patient Instructions (Signed)
We will work on arranging bronchoscopy to evaluate your pulmonary nodules and your enlarged hilar lymph node.  This will be done under general anesthesia as an outpatient at [facility] endoscopy.  You will need a designated driver.  We will try to get this scheduled for 02/02/2022. Continue your Breztri 2 puffs twice a day.  Rinse and gargle after using. Keep your albuterol available to use 2 puffs when you needed for shortness of breath, chest tightness, wheezing. Work hard on decreasing your cigarettes.  Ultimate goal will be to stop altogether. Single prescription of Xanax written for situational anxiety.  You can use 1 mg up to every 12 hours if needed for anxiety. Follow with Dr Lamonte Sakai in 1 month

## 2022-01-22 NOTE — Progress Notes (Signed)
Subjective:    Patient ID: Mark Yu, male    DOB: [birth year], 66 y.o.   MRN: [id number]  HPI 66 year old gentleman with a history of tobacco use (50 pack years), currently smokes 1 pack daily.  He has a history of COPD, hypertension, anxiety, allergic rhinitis.  He has been experiencing progressive dyspnea and difficulty clearing thick sputum, prompting evaluation in the emergency department 01/06/2022.  Chest imaging as below.  He saw Dr. Julien Nordmann on 01/08/2022 and underwent PET, MRI brain yesterday  Managed on [place].  Uses albuterol frequently >> usually for SOB, even when still, worse when exerting Has am mucous, clear. No hemoptysis. No real dyspnea at rest.   CT-PA 01/06/2022 reviewed by me, shows left hilar lymphadenopathy, irregular macrolobulated left upper lobe pulmonary nodule 2.2 x 1.3 cm.  There is a smoothly marginated ovoid nodule in the medial right upper lobe 9 x 8 mm  PET scan 01/20/2022 reviewed by me shows intense hypermetabolism in the elongated lobular left upper lobe pulmonary nodule, intense hypermetabolism in the left hilar node, faint hypermetabolic activity in the rounded right upper lobe nodule   Review of Systems As per HPI  Past Medical History:  Diagnosis Date   COPD (chronic obstructive pulmonary disease) ([place])    HTN (hypertension)      Family History  Problem Relation Age of Onset   Alzheimer's disease Mother    Multiple sclerosis Father      Social History   Socioeconomic History   Marital status: Unknown    Spouse name: Not on file   Number of children: Not on file   Years of education: Not on file   Highest education level: Not on file  Occupational History   Not on file  Tobacco Use   Smoking status: Every Day    Packs/day: 1.00    Years: 50.00    Pack years: 50.00    Types: Cigarettes   Smokeless tobacco: Not on file   Tobacco comments:    3 -4 cigarettes smoked daily ARJ 01/22/22  Substance and Sexual Activity   Alcohol  use: Yes    Alcohol/week: 30.0 standard drinks    Types: 30 Cans of beer per week   Drug use: Yes    Types: Marijuana   Sexual activity: Not on file  Other Topics Concern   Not on file  Social History Narrative   Not on file   Social Determinants of Health   Financial Resource Strain: Not on file  Food Insecurity: Not on file  Transportation Needs: Not on file  Physical Activity: Not on file  Stress: Not on file  Social Connections: Not on file  Intimate Partner Violence: Not on file     Allergies  Allergen Reactions   Amoxicillin Rash     Outpatient Medications Prior to Visit  Medication Sig Dispense Refill   albuterol (VENTOLIN HFA) 108 (90 Base) MCG/ACT inhaler Inhale 2 puffs into the lungs every 4 (four) hours as needed for wheezing or shortness of breath.     amLODipine (NORVASC) 5 MG tablet Take 5 mg by mouth daily.     BREZTRI AEROSPHERE 160-9-4.8 MCG/ACT AERO Inhale 2 puffs into the lungs in the morning and at bedtime.     Camphor-Menthol-Methyl Sal (SALONPAS EX) Apply 1 patch topically as needed (rib pain).     cholecalciferol (VITAMIN D) 25 MCG (1000 UNIT) tablet Take 1,000 Units by mouth daily.     Dextromethorphan-guaiFENesin (MUCINEX DM PO) Take 1 tablet by  mouth 2 (two) times daily as needed (cough).     fluticasone (FLONASE) 50 MCG/ACT nasal spray Place 2 sprays into both nostrils daily as needed for allergies or rhinitis.     folic acid (FOLVITE) 809 MCG tablet Take 400 mcg by mouth daily.     ibuprofen (ADVIL) 200 MG tablet Take 600 mg by mouth every 6 (six) hours as needed for moderate pain or headache.     Magnesium 500 MG TABS Take 500 mg by mouth daily.     methylPREDNISolone (MEDROL DOSEPAK) 4 MG TBPK tablet Use as instructed. 21 tablet 0   Oyster Shell (OYSTER CALCIUM) 500 MG TABS tablet Take 500 mg of elemental calcium by mouth daily.     vitamin B-12 (CYANOCOBALAMIN) 1000 MCG tablet Take 1,000 mcg by mouth daily.     ALPRAZolam (XANAX) 0.25 MG tablet  Take 1 tablet (0.25 mg total) by mouth at bedtime. 10 tablet 0   No facility-administered medications prior to visit.         Objective:   Physical Exam Vitals:   01/22/22 1351  BP: (!) 142/86  Pulse: (!) 117  Temp: 98.2 F (36.8 C)  TempSrc: Oral  SpO2: 98%  Weight: 112 lb (50.8 kg)  Height: 5\' 11"  (1.803 m)   Gen: Pleasant, thin, in no distress,  normal affect  ENT: No lesions,  mouth clear,  oropharynx clear, no postnasal drip  Neck: No JVD, no stridor  Lungs: No use of accessory muscles, distant, no crackles or wheezing on normal respiration, no wheeze on forced expiration  Cardiovascular: RRR, heart sounds normal, no murmur or gallops, no peripheral edema  Musculoskeletal: No deformities, no cyanosis or clubbing  Neuro: alert, awake, non focal  Skin: Warm, no lesions or rash      Assessment & Plan:  COPD (chronic obstructive pulmonary disease) (HCC) Continue your Breztri 2 puffs twice a day.  Rinse and gargle after using. Keep your albuterol available to use 2 puffs when you needed for shortness of breath, chest tightness, wheezing. Follow with Dr Lamonte Sakai in 1 month  Tobacco use Work hard on decreasing your cigarettes.  Ultimate goal will be to stop altogether.  Pulmonary nodules We will work on arranging bronchoscopy to evaluate your pulmonary nodules and your enlarged hilar lymph node.  This will be done under general anesthesia as an outpatient at [facility] endoscopy.  You will need a designated driver.  We will try to get this scheduled for 02/02/2022. Follow with Dr Lamonte Sakai in 1 month  Anxiety Single prescription of Xanax written for situational anxiety.  You can use 1 mg up to every 12 hours if needed for anxiety.   Baltazar Apo, MD, PhD 01/22/2022, 2:28 PM [facility] [phone] or if no answer before 7:00PM call [phone] For any issues after 7:00PM please call eLink [phone]

## 2022-01-22 NOTE — Assessment & Plan Note (Signed)
We will work on arranging bronchoscopy to evaluate your pulmonary nodules and your enlarged hilar lymph node.  This will be done under general anesthesia as an outpatient at [facility] endoscopy.  You will need a designated driver.  We will try to get this scheduled for 02/02/2022. Follow with Dr Lamonte Sakai in 1 month

## 2022-01-22 NOTE — Telephone Encounter (Signed)
I scheduled pt for 2/6 at 11:30 on 2/6 at [facility] Endo.  Pt will go for covid test on 2/3.  Gave appt info to pt with letter.  Spoke to [place] at [facility] CT and she will have disc sent to [facility] Endo.

## 2022-01-22 NOTE — H&P (View-Only) (Signed)
Subjective:    Patient ID: Mark Yu, male    DOB: 10/05/1956, 65 y.o.   MRN: [id number]  HPI 66 year old gentleman with a history of tobacco use (50 pack years), currently smokes 1 pack daily.  He has a history of COPD, hypertension, anxiety, allergic rhinitis.  He has been experiencing progressive dyspnea and difficulty clearing thick sputum, prompting evaluation in the emergency department 01/06/2022.  Chest imaging as below.  He saw Dr. Julien Nordmann on 01/08/2022 and underwent PET, MRI brain yesterday  Managed on [place].  Uses albuterol frequently >> usually for SOB, even when still, worse when exerting Has am mucous, clear. No hemoptysis. No real dyspnea at rest.   CT-PA 01/06/2022 reviewed by me, shows left hilar lymphadenopathy, irregular macrolobulated left upper lobe pulmonary nodule 2.2 x 1.3 cm.  There is a smoothly marginated ovoid nodule in the medial right upper lobe 9 x 8 mm  PET scan 01/20/2022 reviewed by me shows intense hypermetabolism in the elongated lobular left upper lobe pulmonary nodule, intense hypermetabolism in the left hilar node, faint hypermetabolic activity in the rounded right upper lobe nodule   Review of Systems As per HPI  Past Medical History:  Diagnosis Date   COPD (chronic obstructive pulmonary disease) ([place])    HTN (hypertension)      Family History  Problem Relation Age of Onset   Alzheimer's disease Mother    Multiple sclerosis Father      Social History   Socioeconomic History   Marital status: Unknown    Spouse name: Not on file   Number of children: Not on file   Years of education: Not on file   Highest education level: Not on file  Occupational History   Not on file  Tobacco Use   Smoking status: Every Day    Packs/day: 1.00    Years: 50.00    Pack years: 50.00    Types: Cigarettes   Smokeless tobacco: Not on file   Tobacco comments:    3 -4 cigarettes smoked daily ARJ 01/22/22  Substance and Sexual Activity   Alcohol  use: Yes    Alcohol/week: 30.0 standard drinks    Types: 30 Cans of beer per week   Drug use: Yes    Types: Marijuana   Sexual activity: Not on file  Other Topics Concern   Not on file  Social History Narrative   Not on file   Social Determinants of Health   Financial Resource Strain: Not on file  Food Insecurity: Not on file  Transportation Needs: Not on file  Physical Activity: Not on file  Stress: Not on file  Social Connections: Not on file  Intimate Partner Violence: Not on file     Allergies  Allergen Reactions   Amoxicillin Rash     Outpatient Medications Prior to Visit  Medication Sig Dispense Refill   albuterol (VENTOLIN HFA) 108 (90 Base) MCG/ACT inhaler Inhale 2 puffs into the lungs every 4 (four) hours as needed for wheezing or shortness of breath.     amLODipine (NORVASC) 5 MG tablet Take 5 mg by mouth daily.     BREZTRI AEROSPHERE 160-9-4.8 MCG/ACT AERO Inhale 2 puffs into the lungs in the morning and at bedtime.     Camphor-Menthol-Methyl Sal (SALONPAS EX) Apply 1 patch topically as needed (rib pain).     cholecalciferol (VITAMIN D) 25 MCG (1000 UNIT) tablet Take 1,000 Units by mouth daily.     Dextromethorphan-guaiFENesin (MUCINEX DM PO) Take 1 tablet by  mouth 2 (two) times daily as needed (cough).     fluticasone (FLONASE) 50 MCG/ACT nasal spray Place 2 sprays into both nostrils daily as needed for allergies or rhinitis.     folic acid (FOLVITE) 177 MCG tablet Take 400 mcg by mouth daily.     ibuprofen (ADVIL) 200 MG tablet Take 600 mg by mouth every 6 (six) hours as needed for moderate pain or headache.     Magnesium 500 MG TABS Take 500 mg by mouth daily.     methylPREDNISolone (MEDROL DOSEPAK) 4 MG TBPK tablet Use as instructed. 21 tablet 0   Oyster Shell (OYSTER CALCIUM) 500 MG TABS tablet Take 500 mg of elemental calcium by mouth daily.     vitamin B-12 (CYANOCOBALAMIN) 1000 MCG tablet Take 1,000 mcg by mouth daily.     ALPRAZolam (XANAX) 0.25 MG tablet  Take 1 tablet (0.25 mg total) by mouth at bedtime. 10 tablet 0   No facility-administered medications prior to visit.         Objective:   Physical Exam Vitals:   01/22/22 1351  BP: (!) 142/86  Pulse: (!) 117  Temp: 98.2 F (36.8 C)  TempSrc: Oral  SpO2: 98%  Weight: 112 lb (50.8 kg)  Height: 5\' 11"  (1.803 m)   Gen: Pleasant, thin, in no distress,  normal affect  ENT: No lesions,  mouth clear,  oropharynx clear, no postnasal drip  Neck: No JVD, no stridor  Lungs: No use of accessory muscles, distant, no crackles or wheezing on normal respiration, no wheeze on forced expiration  Cardiovascular: RRR, heart sounds normal, no murmur or gallops, no peripheral edema  Musculoskeletal: No deformities, no cyanosis or clubbing  Neuro: alert, awake, non focal  Skin: Warm, no lesions or rash      Assessment & Plan:  COPD (chronic obstructive pulmonary disease) (HCC) Continue your Breztri 2 puffs twice a day.  Rinse and gargle after using. Keep your albuterol available to use 2 puffs when you needed for shortness of breath, chest tightness, wheezing. Follow with Dr Lamonte Sakai in 1 month  Tobacco use Work hard on decreasing your cigarettes.  Ultimate goal will be to stop altogether.  Pulmonary nodules We will work on arranging bronchoscopy to evaluate your pulmonary nodules and your enlarged hilar lymph node.  This will be done under general anesthesia as an outpatient at [facility] endoscopy.  You will need a designated driver.  We will try to get this scheduled for 02/02/2022. Follow with Dr Lamonte Sakai in 1 month  Anxiety Single prescription of Xanax written for situational anxiety.  You can use 1 mg up to every 12 hours if needed for anxiety.   Baltazar Apo, MD, PhD 01/22/2022, 2:28 PM [place] [facility] [phone] or if no answer before 7:00PM call [phone] For any issues after 7:00PM please call eLink [phone]

## 2022-01-22 NOTE — Assessment & Plan Note (Signed)
Single prescription of Xanax written for situational anxiety.  You can use 1 mg up to every 12 hours if needed for anxiety.

## 2022-01-29 ENCOUNTER — Institutional Professional Consult (permissible substitution): Payer: Medicare Other | Admitting: Internal Medicine

## 2022-01-30 ENCOUNTER — Encounter (HOSPITAL_COMMUNITY): Payer: Self-pay | Admitting: Emergency Medicine

## 2022-01-30 ENCOUNTER — Other Ambulatory Visit: Payer: Self-pay

## 2022-01-30 ENCOUNTER — Other Ambulatory Visit (HOSPITAL_COMMUNITY)
Admission: RE | Admit: 2022-01-30 | Discharge: 2022-01-30 | Disposition: A | Discharge status: Home / Self Care | Payer: Medicare Other | Source: Ambulatory Visit | Attending: Emergency Medicine | Admitting: Emergency Medicine

## 2022-01-30 DIAGNOSIS — F32A Depression, unspecified: Secondary | ICD-10-CM | POA: Diagnosis not present

## 2022-01-30 DIAGNOSIS — Z20822 Contact with and (suspected) exposure to covid-19: Secondary | ICD-10-CM | POA: Insufficient documentation

## 2022-01-30 DIAGNOSIS — I10 Essential (primary) hypertension: Secondary | ICD-10-CM | POA: Diagnosis not present

## 2022-01-30 DIAGNOSIS — Z01812 Encounter for preprocedural laboratory examination: Secondary | ICD-10-CM | POA: Insufficient documentation

## 2022-01-30 DIAGNOSIS — F17210 Nicotine dependence, cigarettes, uncomplicated: Secondary | ICD-10-CM | POA: Diagnosis not present

## 2022-01-30 DIAGNOSIS — R846 Abnormal cytological findings in specimens from respiratory organs and thorax: Secondary | ICD-10-CM | POA: Diagnosis not present

## 2022-01-30 DIAGNOSIS — Z01818 Encounter for other preprocedural examination: Secondary | ICD-10-CM

## 2022-01-30 DIAGNOSIS — R918 Other nonspecific abnormal finding of lung field: Secondary | ICD-10-CM | POA: Diagnosis present

## 2022-01-30 DIAGNOSIS — R590 Localized enlarged lymph nodes: Secondary | ICD-10-CM | POA: Diagnosis not present

## 2022-01-30 DIAGNOSIS — F419 Anxiety disorder, unspecified: Secondary | ICD-10-CM | POA: Diagnosis not present

## 2022-01-30 DIAGNOSIS — J449 Chronic obstructive pulmonary disease, unspecified: Secondary | ICD-10-CM | POA: Diagnosis not present

## 2022-01-30 LAB — SARS CORONAVIRUS 2 (TAT 6-24 HRS): SARS Coronavirus 2: NEGATIVE

## 2022-01-30 NOTE — Progress Notes (Signed)
DUE TO COVID-19 ONLY ONE VISITOR IS ALLOWED TO COME WITH YOU AND STAY IN THE WAITING ROOM ONLY DURING PRE OP AND PROCEDURE DAY OF SURGERY.   PCP - Dr Leota Sauers Cardiologist - n/a  CT Chest x-ray - 01/06/22 (1V) EKG - 01/06/22 Stress Test - n/a ECHO - n/a Cardiac Cath - n/a  ICD Pacemaker/Loop - n/a  Sleep Study -  n/a CPAP - none  STOP now taking any Aspirin (unless otherwise instructed by your surgeon), Aleve, Naproxen, Ibuprofen, Motrin, Advil, Goody's, BC's, all herbal medications, fish oil, and all vitamins.   Coronavirus Screening Covid test is scheduled on 01/30/22 Do you have any of the following symptoms:  Cough yes/no: No Fever (>100.42F)  yes/no: No Runny nose yes/no: No Sore throat yes/no: No Difficulty breathing/shortness of breath  yes/no: No  Have you traveled in the last 14 days and where? yes/no: No  Patient verbalized understanding of instructions that were given via phone.

## 2022-01-30 NOTE — Anesthesia Preprocedure Evaluation (Addendum)
Anesthesia Evaluation  Patient identified by MRN, date of birth, ID band Patient awake    Reviewed: Allergy & Precautions, NPO status , Patient's Chart, lab work & pertinent test results, reviewed documented beta blocker date and time   Airway Mallampati: I  TM Distance: >3 FB Neck ROM: Full    Dental  (+) Poor Dentition, Upper Dentures, Dental Advisory Given   Pulmonary shortness of breath and with exertion, COPD,  COPD inhaler, Current SmokerPatient did not abstain from smoking.,    Pulmonary exam normal breath sounds clear to auscultation       Cardiovascular hypertension, Pt. on medications Normal cardiovascular exam Rhythm:Regular Rate:Normal     Neuro/Psych PSYCHIATRIC DISORDERS Anxiety Depression    GI/Hepatic negative GI ROS, Neg liver ROS,   Endo/Other  negative endocrine ROS  Renal/GU negative Renal ROS  negative genitourinary   Musculoskeletal negative musculoskeletal ROS (+)   Abdominal   Peds  Hematology negative hematology ROS (+)   Anesthesia Other Findings   Reproductive/Obstetrics                            Anesthesia Physical Anesthesia Plan  ASA: 3  Anesthesia Plan: General   Post-op Pain Management:    Induction: Intravenous  PONV Risk Score and Plan: 1 and Treatment may vary due to age or medical condition and Ondansetron  Airway Management Planned: Oral ETT  Additional Equipment:   Intra-op Plan:   Post-operative Plan: Extubation in OR  Informed Consent: I have reviewed the patients History and Physical, chart, labs and discussed the procedure including the risks, benefits and alternatives for the proposed anesthesia with the patient or authorized representative who has indicated his/her understanding and acceptance.     Dental advisory given  Plan Discussed with: CRNA and Anesthesiologist  Anesthesia Plan Comments: (PAT note written 01/30/2022 by  Myra Gianotti, PA-C. )       Anesthesia Quick Evaluation

## 2022-01-30 NOTE — Progress Notes (Signed)
Anesthesia Chart Review: Mark Yu    Case: [id number] Date/Time: 02/02/22 1100   Procedures:      ROBOTIC ASSISTED NAVIGATIONAL BRONCHOSCOPY     VIDEO BRONCHOSCOPY WITH ENDOBRONCHIAL ULTRASOUND   Anesthesia type: General   Pre-op diagnosis: lung mass   Location: [facility] CARDIOLOG[facility] ROOM 3 / [facility]   Surgeons: Collene Gobble, MD       DISCUSSION: Patient is a 66 year old male scheduled for the above procedure.  History includes smoking, COPD, HTN, exertional dyspnea, lung nodules (01/06/22).  Coronary calcifications on 01/06/22 CTA.   [place] ED visit 01/06/22 after he developed progressive SOB over days but worse 2 hour prior and with no improvement after home breathing treatment. Tachypneic at 40-50/min with EMS, improved after O2, nebulizer, solumedrol, and magnesium. Initially placed on NRB, then BiPAP to 3L/ but ultimately weaned to RA. Negative for COVID and flu. D-dimer 1.10. CTA negative for PE for concerning for possible lung cancer. EKG showed ST, non-specific T wave abnormality, cannot rule out anterolateral infarct, no comparison tracing prior to that date. Symptoms though likely from COPD exacerbation and discharged on steroid taper and out-patient oncology follow-up.   [name] was evaluated by oncologist Dr Mark Yu on 01/08/22. He thought patient may have synchronous primary lung cancers.  He reported lack of taste and a few pounds of weight loss over the past couple months.  Positive productive cough with thick mucus, as well as shortness of breath but denies significant chest pain or hemoptysis.  He works as a Production manager.  His significant other is reportedly an Therapist, sports at [employer]. Smoked cigarettes up to 1.5 PPD x 50 days, now smoking a few cigs/day. Drinks ~ 4 beers/day.  + Marijuana use.  - Dr. Julien Yu ordered a PET scan, MRI Brain, and referred him to pulmonology for consideration of bronchoscopy with EBUS and biopsy of the left upper lobe as well as  the right upper lobe lung nodules for confirmation of tissue diagnosis.  Smoking cessation also encouraged.  He was evaluated by Dr. Lamonte Sakai on 01/22/22. 01/20/22 PET scan showed hypermetabolic LUL nodule with concern for left hilar metastasis. RUL nodule did not have any significant metabolic activity.  There was a segment of metabolic activity in the proximal sigmoid colon which could represent chronic inflammation however colonoscopy recommended if not current for screening.  Reviewed case with anesthesiologist Hoy Morn, MD. No reported cardiac history, although evidence of coronary atherosclerosis on recent CTA. Exertional dyspnea and productive cough seem primary symptoms per pulmonary evaluations. No hemoptysis and "no real dyspnea at rest". No chest pain. He has suspected lung cancer and needs procedure for tissue diagnosis to guide management. Staging and PFTs may also guide treatment recommendations. Should he be referred for lung resection in the future then he may need to be considered for preoperative cardiology evaluation--I sent communication to Dr. Lamonte Sakai regarding this. Anesthesia team to evaluate on the day of surgery to assess for any acute changes prior to proceeding with bronchoscopy. His last labs are within 30 days.    01/30/22 presurgical COVID-19 test is in process.    VS: Ht 5\' 11"  (1.803 m)    Wt 50.8 kg    BMI 15.62 kg/m  BP Readings from Last 3 Encounters:  01/22/22 (!) 142/86  01/08/22 (!) 136/96  01/06/22 120/89   Pulse Readings from Last 3 Encounters:  01/22/22 (!) 117  01/08/22 (!) 109  01/06/22 (!) 110  PROVIDERS: Chow, Bebe Shaggy, MD is PCP ([facility]) Curt Bears, MD is HEM-ONC   LABS: Most recent labs results include: Lab Results  Component Value Date   WBC 3.9 (L) 01/08/2022   HGB 12.8 (L) 01/08/2022   HCT 38.8 (L) 01/08/2022   PLT 271 01/08/2022   GLUCOSE 99 01/08/2022   ALT 21 01/08/2022   AST 27 01/08/2022   NA 134 (L)  01/08/2022   K 4.7 01/08/2022   CL 98 01/08/2022   CREATININE 0.79 01/08/2022   BUN 19 01/08/2022   CO2 27 01/08/2022     IMAGES: PET Scan 01/20/22: IMPRESSION: 1. 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 2. ipsilateral LEFT hilar nodal metastasis. 3. No mediastinal nodal metastasis or supraclavicular nodal metastasis. 4. No distant metastatic disease 5. Nodule in the RIGHT upper lobe does not have significant metabolic activity. Recommend attention on follow-up. 6. Centrilobular emphysema in the upper lobes. 7. A segment of intense metabolic activity associated the proximal sigmoid colon. 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 Recommend colonoscopy if patient is not current for screening.   MRI Brain 01/20/22: IMPRESSION: No acute intracranial process. No evidence of metastatic disease in the brain.   CTA Chest 01/06/22: IMPRESSION: 1. No evidence of pulmonary embolism. 2. Pulmonary nodules in the lungs bilaterally, most concerning of which is a left upper lobe pulmonary nodule measuring 2.2 x 1.3 x 0.9 cm associated with left hilar lymphadenopathy. Findings are concerning for probable neoplasm. Further evaluation with nonemergent PET-CT is recommended in the near future to provide additional diagnostic and staging information. 3. Aortic atherosclerosis, in addition to three-vessel coronary artery disease. Please note that although the presence of coronary artery calcium documents the presence of coronary artery disease, the severity of this disease and any potential stenosis cannot be assessed on this non-gated CT examination. Assessment for potential risk factor modification, dietary therapy or pharmacologic therapy may be warranted, if clinically indicated. 4. Diffuse bronchial wall thickening with moderate centrilobular and paraseptal emphysema; imaging findings suggestive of  underlying COPD.   EKG: 01/06/22: Sinus tachycardia LAE, consider biatrial enlargement Nonspecific T abnormalities, lateral leads ST elevation, consider anterior injury Prolonged QT interval [QT 349 ms/QTc 516 ms] No prior ECG before today in system. No STEMI   CV: N/A   Past Medical History:  Diagnosis Date   Anxiety    tx xanax   COPD (chronic obstructive pulmonary disease) (HCC)    tx inhalers   Depression    Dyspnea    with exertion   HTN (hypertension)    tx amlodipine    Past Surgical History:  Procedure Laterality Date   MRI     NO PAST SURGERIES      MEDICATIONS: No current facility-administered medications for this encounter.    acetaminophen (TYLENOL) 500 MG tablet   albuterol (VENTOLIN HFA) 108 (90 Base) MCG/ACT inhaler   ALPRAZolam (XANAX) 1 MG tablet   amLODipine (NORVASC) 5 MG tablet   BREZTRI AEROSPHERE 160-9-4.8 MCG/ACT AERO   Calcium Carb-Cholecalciferol 500-10 MG-MCG TABS   cholecalciferol (VITAMIN D) 25 MCG (1000 UNIT) tablet   fluticasone (FLONASE) 50 MCG/ACT nasal spray   folic acid (FOLVITE) 053 MCG tablet   guaiFENesin (MUCINEX) 600 MG 12 hr tablet   magnesium oxide (MAG-OX) 400 MG tablet   vitamin B-12 (CYANOCOBALAMIN) 1000 MCG tablet    Myra Gianotti, PA-C Surgical Short Stay/Anesthesiology [facility] Phone [phone] [facility] Phone [phone] 01/30/2022 3:22 PM

## 2022-02-02 ENCOUNTER — Encounter (HOSPITAL_COMMUNITY)
Admission: RE | Disposition: A | Discharge status: Home / Self Care | Payer: Self-pay | Source: Home / Self Care | Attending: Emergency Medicine

## 2022-02-02 ENCOUNTER — Ambulatory Visit (HOSPITAL_COMMUNITY): Payer: Medicare Other | Admitting: Vascular Surgery

## 2022-02-02 ENCOUNTER — Ambulatory Visit (HOSPITAL_COMMUNITY): Payer: Medicare Other

## 2022-02-02 ENCOUNTER — Ambulatory Visit (HOSPITAL_COMMUNITY)
Admission: RE | Admit: 2022-02-02 | Discharge: 2022-02-02 | Disposition: A | Discharge status: Home / Self Care | Payer: Medicare Other | Attending: Emergency Medicine | Admitting: Emergency Medicine

## 2022-02-02 ENCOUNTER — Other Ambulatory Visit: Payer: Self-pay

## 2022-02-02 ENCOUNTER — Encounter (HOSPITAL_COMMUNITY): Payer: Self-pay | Admitting: Emergency Medicine

## 2022-02-02 ENCOUNTER — Inpatient Hospital Stay: Payer: Medicare Other | Admitting: Internal Medicine

## 2022-02-02 DIAGNOSIS — R846 Abnormal cytological findings in specimens from respiratory organs and thorax: Secondary | ICD-10-CM | POA: Insufficient documentation

## 2022-02-02 DIAGNOSIS — F32A Depression, unspecified: Secondary | ICD-10-CM | POA: Insufficient documentation

## 2022-02-02 DIAGNOSIS — R918 Other nonspecific abnormal finding of lung field: Secondary | ICD-10-CM | POA: Diagnosis present

## 2022-02-02 DIAGNOSIS — Z20822 Contact with and (suspected) exposure to covid-19: Secondary | ICD-10-CM | POA: Diagnosis not present

## 2022-02-02 DIAGNOSIS — Z01818 Encounter for other preprocedural examination: Secondary | ICD-10-CM

## 2022-02-02 DIAGNOSIS — Z9889 Other specified postprocedural states: Secondary | ICD-10-CM

## 2022-02-02 DIAGNOSIS — R590 Localized enlarged lymph nodes: Secondary | ICD-10-CM | POA: Diagnosis not present

## 2022-02-02 DIAGNOSIS — I10 Essential (primary) hypertension: Secondary | ICD-10-CM | POA: Insufficient documentation

## 2022-02-02 DIAGNOSIS — J449 Chronic obstructive pulmonary disease, unspecified: Secondary | ICD-10-CM | POA: Insufficient documentation

## 2022-02-02 DIAGNOSIS — F17210 Nicotine dependence, cigarettes, uncomplicated: Secondary | ICD-10-CM | POA: Insufficient documentation

## 2022-02-02 DIAGNOSIS — F419 Anxiety disorder, unspecified: Secondary | ICD-10-CM | POA: Insufficient documentation

## 2022-02-02 HISTORY — PX: BRONCHIAL BIOPSY: SHX5109

## 2022-02-02 HISTORY — DX: Depression, unspecified: F32.A

## 2022-02-02 HISTORY — PX: BRONCHIAL NEEDLE ASPIRATION BIOPSY: SHX5106

## 2022-02-02 HISTORY — DX: Anxiety disorder, unspecified: F41.9

## 2022-02-02 HISTORY — PX: BRONCHIAL BRUSHINGS: SHX5108

## 2022-02-02 HISTORY — PX: VIDEO BRONCHOSCOPY WITH ENDOBRONCHIAL ULTRASOUND: SHX6177

## 2022-02-02 HISTORY — PX: VIDEO BRONCHOSCOPY WITH RADIAL ENDOBRONCHIAL ULTRASOUND: SHX6849

## 2022-02-02 HISTORY — DX: Dyspnea, unspecified: R06.00

## 2022-02-02 SURGERY — BRONCHOSCOPY, WITH BIOPSY USING ELECTROMAGNETIC NAVIGATION
Anesthesia: General

## 2022-02-02 MED ORDER — PHENYLEPHRINE 40 MCG/ML (10ML) SYRINGE FOR IV PUSH (FOR BLOOD PRESSURE SUPPORT)
PREFILLED_SYRINGE | INTRAVENOUS | Status: DC | PRN
  Administered 2022-02-02 (×9): 80 ug via INTRAVENOUS

## 2022-02-02 MED ORDER — ROCURONIUM BROMIDE 10 MG/ML (PF) SYRINGE
PREFILLED_SYRINGE | INTRAVENOUS | Status: DC | PRN
  Administered 2022-02-02: 20 mg via INTRAVENOUS
  Administered 2022-02-02: 60 mg via INTRAVENOUS
  Administered 2022-02-02: 40 mg via INTRAVENOUS

## 2022-02-02 MED ORDER — PROPOFOL 10 MG/ML IV BOLUS
INTRAVENOUS | Status: DC | PRN
  Administered 2022-02-02: 20 mg via INTRAVENOUS
  Administered 2022-02-02: 150 mg via INTRAVENOUS

## 2022-02-02 MED ORDER — FENTANYL CITRATE (PF) 100 MCG/2ML IJ SOLN
INTRAMUSCULAR | Status: AC
  Filled 2022-02-02: qty 2

## 2022-02-02 MED ORDER — DEXAMETHASONE SODIUM PHOSPHATE 4 MG/ML IJ SOLN
INTRAMUSCULAR | Status: DC | PRN
  Administered 2022-02-02: 10 mg via INTRAVENOUS

## 2022-02-02 MED ORDER — ALPRAZOLAM 1 MG PO TABS: 0.5000 mg | ORAL_TABLET | Freq: Two times a day (BID) | ORAL | Status: DC | PRN

## 2022-02-02 MED ORDER — SUGAMMADEX SODIUM 200 MG/2ML IV SOLN
INTRAVENOUS | Status: DC | PRN
  Administered 2022-02-02: 200 mg via INTRAVENOUS

## 2022-02-02 MED ORDER — ONDANSETRON HCL 4 MG/2ML IJ SOLN
INTRAMUSCULAR | Status: DC | PRN
  Administered 2022-02-02: 4 mg via INTRAVENOUS

## 2022-02-02 MED ORDER — LACTATED RINGERS IV SOLN: INTRAVENOUS | Status: DC

## 2022-02-02 MED ORDER — PHENYLEPHRINE HCL (PRESSORS) 10 MG/ML IV SOLN: INTRAVENOUS | Status: DC | PRN

## 2022-02-02 MED ORDER — CHLORHEXIDINE GLUCONATE 0.12 % MT SOLN
15.0000 mL | Freq: Once | OROMUCOSAL | Status: AC
  Administered 2022-02-02: 15 mL via OROMUCOSAL
  Filled 2022-02-02 (×2): qty 15

## 2022-02-02 MED ORDER — LIDOCAINE 2% (20 MG/ML) 5 ML SYRINGE
INTRAMUSCULAR | Status: DC | PRN
Start: 2022-02-02 — End: 2022-02-02
  Administered 2022-02-02: 10 mg via INTRAVENOUS
  Administered 2022-02-02: 60 mg via INTRAVENOUS

## 2022-02-02 MED ORDER — FENTANYL CITRATE (PF) 100 MCG/2ML IJ SOLN
INTRAMUSCULAR | Status: DC | PRN
  Administered 2022-02-02: 50 ug via INTRAVENOUS

## 2022-02-02 NOTE — Transfer of Care (Signed)
Immediate Anesthesia Transfer of Care Note  Patient: Mark Yu  Procedure(s) Performed: ROBOTIC ASSISTED NAVIGATIONAL BRONCHOSCOPY VIDEO BRONCHOSCOPY WITH ENDOBRONCHIAL ULTRASOUND BRONCHIAL NEEDLE ASPIRATION BIOPSIES BRONCHIAL BRUSHINGS BRONCHIAL BIOPSIES VIDEO BRONCHOSCOPY WITH RADIAL ENDOBRONCHIAL ULTRASOUND  Patient Location: PACU  Anesthesia Type:General  Level of Consciousness: awake, alert , oriented and patient cooperative  Airway & Oxygen Therapy: Patient Spontanous Breathing and Patient connected to face mask oxygen  Post-op Assessment: Report given to RN, Post -op Vital signs reviewed and stable and Patient moving all extremities X 4  Post vital signs: Reviewed and stable  Last Vitals:  Vitals Value Taken Time  BP 153/92 02/02/22 1316  Temp 36.8 C 02/02/22 1315  Pulse 100 02/02/22 1328  Resp 18 02/02/22 1328  SpO2 96 % 02/02/22 1328  Vitals shown include unvalidated device data.  Last Pain:  Vitals:   02/02/22 1315  TempSrc:   PainSc: 0-No pain         Complications: No notable events documented.

## 2022-02-02 NOTE — Interval H&P Note (Signed)
History and Physical Interval Note:  02/02/2022 9:07 AM  Mark Yu  has presented today for surgery, with the diagnosis of lung mass.  The various methods of treatment have been discussed with the patient and family. After consideration of risks, benefits and other options for treatment, the patient has consented to  Procedure(s): ROBOTIC ASSISTED NAVIGATIONAL BRONCHOSCOPY (N/A) VIDEO BRONCHOSCOPY WITH ENDOBRONCHIAL ULTRASOUND (N/A) as a surgical intervention.  The patient's history has been reviewed, patient examined, no change in status, stable for surgery.  I have reviewed the patient's chart and labs.  Questions were answered to the patient's satisfaction.     Collene Gobble

## 2022-02-02 NOTE — Anesthesia Procedure Notes (Signed)
Procedure Name: Intubation Date/Time: 02/02/2022 10:51 AM Performed by: Reeves Dam, CRNA Pre-anesthesia Checklist: Patient identified, Patient being monitored, Timeout performed, Emergency Drugs available and Suction available Patient Re-evaluated:Patient Re-evaluated prior to induction Oxygen Delivery Method: Circle system utilized Preoxygenation: Pre-oxygenation with 100% oxygen Induction Type: IV induction Ventilation: Mask ventilation without difficulty Laryngoscope Size: 3 and Miller Grade View: Grade I Tube type: Oral Tube size: 8.5 mm Number of attempts: 1 Airway Equipment and Method: Stylet Placement Confirmation: ETT inserted through vocal cords under direct vision, positive ETCO2 and breath sounds checked- equal and bilateral Secured at: 22 cm Tube secured with: Tape Dental Injury: Teeth and Oropharynx as per pre-operative assessment

## 2022-02-02 NOTE — Anesthesia Postprocedure Evaluation (Signed)
Anesthesia Post Note  Patient: Mark Yu  Procedure(s) Performed: ROBOTIC ASSISTED NAVIGATIONAL BRONCHOSCOPY VIDEO BRONCHOSCOPY WITH ENDOBRONCHIAL ULTRASOUND BRONCHIAL NEEDLE ASPIRATION BIOPSIES BRONCHIAL BRUSHINGS BRONCHIAL BIOPSIES VIDEO BRONCHOSCOPY WITH RADIAL ENDOBRONCHIAL ULTRASOUND     Patient location during evaluation: PACU Anesthesia Type: General Level of consciousness: awake and alert and oriented Pain management: pain level controlled Vital Signs Assessment: post-procedure vital signs reviewed and stable Respiratory status: spontaneous breathing, nonlabored ventilation and respiratory function stable Cardiovascular status: blood pressure returned to baseline and stable Postop Assessment: no apparent nausea or vomiting Anesthetic complications: no   No notable events documented.  Last Vitals:  Vitals:   02/02/22 1315 02/02/22 1345  BP: (!) 153/92 129/84  Pulse: 100 93  Resp: (!) 23 15  Temp: 36.8 C 36.8 C  SpO2: 96% 96%    Last Pain:  Vitals:   02/02/22 1315  TempSrc:   PainSc: 0-No pain                 Mattheo Swindle A.

## 2022-02-02 NOTE — Op Note (Signed)
Video Bronchoscopy with Robotic Assisted Bronchoscopic Navigation and Endobronchial Ultrasound Procedure Note  Date of Operation: 02/02/2022   Pre-op Diagnosis: Bilateral upper lobe nodules, left mediastinal adenopathy  Post-op Diagnosis: Same  Surgeon: Baltazar Apo  Assistants: None  Anesthesia: General endotracheal anesthesia  Operation: Flexible video fiberoptic bronchoscopy with robotic assistance and biopsies.  Estimated Blood Loss: Minimal  Complications: None  Indications and History: Fateh Kindle is a 66 y.o. male with history of tobacco use.  He was found to have a spiculated left upper lobe pulmonary nodule with associated left mediastinal adenopathy that was hypermetabolic on PET scan.  He also had a non-hypermetabolic rounded right upper lobe pulmonary nodule.  Recommendation was made to achieve a tissue diagnosis via robotic assisted navigational bronchoscopy, endobronchial ultrasound and biopsies. The risks, benefits, complications, treatment options and expected outcomes were discussed with the patient.  The possibilities of pneumothorax, pneumonia, reaction to medication, pulmonary aspiration, perforation of a viscus, bleeding, failure to diagnose a condition and creating a complication requiring transfusion or operation were discussed with the patient who freely signed the consent.    Description of Procedure: The patient was seen in the Preoperative Area, was examined and was deemed appropriate to proceed.  The patient was taken to [facility] endoscopy room 3, identified as Faustino Congress and the procedure verified as Flexible Video Fiberoptic Bronchoscopy.  A Time Out was held and the above information confirmed.   Prior to the date of the procedure a high-resolution CT scan of the chest was performed. Utilizing ION software program a virtual tracheobronchial tree was generated to allow the creation of distinct navigation pathways to the patient's parenchymal abnormalities.  After being taken to the operating room general anesthesia was initiated and the patient  was orally intubated. The video fiberoptic bronchoscope was introduced via the endotracheal tube and a general inspection was performed which showed normal right and left lung anatomy, aspiration of the bilateral mainstems was completed to remove any remaining secretions. Robotic catheter inserted into patient's endotracheal tube.   Target #1 left upper lobe pulmonary nodule: The distinct navigation pathways prepared prior to this procedure were then utilized to navigate to patient's lesion identified on CT scan. The robotic catheter was secured into place and the vision probe was withdrawn.  Lesion location was approximated using fluoroscopy and radial endobronchial ultrasound for peripheral targeting. Under fluoroscopic guidance transbronchial needle brushings, transbronchial needle biopsies, and transbronchial forceps biopsies were performed to be sent for cytology and pathology.   Target #2 right upper lobe pulmonary nodule: The distinct navigation pathways prepared prior to this procedure were then utilized to navigate to patient's lesion identified on CT scan.  A solid lobulated white nodule was seen using the vision probe in the airway.  The robotic catheter was secured into place and the vision probe was withdrawn.  Lesion location was approximated using fluoroscopy and radial endobronchial ultrasound for peripheral targeting.  Local registration and navigation targeting was performed using Cios three-dimensional imaging.  Under fluoroscopic guidance transbronchial needle brushings, transbronchial needle biopsies, and transbronchial forceps biopsies were performed to be sent for cytology and pathology.   The Robotic scope was then withdrawn and the endobronchial ultrasound was used to identify and characterize the peritracheal, hilar and bronchial lymph nodes. Inspection showed an enlarged 10 L node.   Unfortunately the biopsy needle could not be adequately positioned to achieve biopsies of this location.  No samples were taken. The patient tolerated the procedure well without apparent complications.    At the  end of the procedure a general airway inspection was performed and there was no evidence of active bleeding. The bronchoscope was removed.  The patient tolerated the procedure well. There was no significant blood loss and there were no obvious complications. A post-procedural chest x-ray is pending.  Samples Target #1: 1. Transbronchial needle brushings from left upper lobe pulmonary nodule 2. Transbronchial Wang needle biopsies from left upper lobe pulmonary nodule 3. Transbronchial forceps biopsies from left upper lobe pulmonary nodule  Samples Target #2: 1. Transbronchial needle brushings from right upper lobe pulmonary nodule 2. Transbronchial Wang needle biopsies from right upper lobe pulmonary nodule 3. Transbronchial forceps biopsies from right upper lobe pulmonary nodule   EBUS Samples: None   Plans:  The patient will be discharged from the PACU to home when recovered from anesthesia and after chest x-ray is reviewed. We will review the cytology, pathology and microbiology results with the patient when they become available. Outpatient followup will be with Dr Lamonte Sakai and Dr Julien Nordmann.   Baltazar Apo, MD, PhD 02/02/2022, 12:57 PM [facility] [phone] or if no answer before 7:00PM call [phone] For any issues after 7:00PM please call eLink [phone]

## 2022-02-04 ENCOUNTER — Encounter: Payer: Self-pay | Admitting: *Deleted

## 2022-02-04 ENCOUNTER — Telehealth: Payer: Self-pay | Admitting: Emergency Medicine

## 2022-02-04 LAB — CYTOLOGY - NON PAP

## 2022-02-04 NOTE — Telephone Encounter (Signed)
Discussed bronchoscopy results with the patient today.  Both the left upper lobe and right upper lobe nodules were positive for small cell lung cancer.  He has an appointment to see Dr. Julien Nordmann on 02/09/2022.  This looks like limited disease only in the chest.  I will follow-up with him regarding his shortness of breath and tobacco use going forward.

## 2022-02-04 NOTE — Progress Notes (Signed)
Oncology Nurse Navigator Documentation  Oncology Nurse Navigator Flowsheets 02/04/2022 01/14/2022 01/13/2022 01/09/2022 01/06/2022  Abnormal Finding Date - - - - 01/05/2022  Confirmed Diagnosis Date 02/02/2022 - - - -  Diagnosis Status Pathology Pending - - - Additional Work Up  Navigator Follow Up Date: 02/09/2022 01/23/2022 01/16/2022 01/12/2022 01/08/2022  Navigator Follow Up Reason: Follow-up After Biopsy Radiology Appointment Review Appointment Review New Patient Appointment  Navigator Location [place]  Referral Date to RadOnc/MedOnc - - - - 01/06/2022  Navigator Encounter Type Telephone Other: Other: Other: Telephone  Telephone Outgoing Call - - - [place] Call  [facility] Date - - - 01/08/2022 -  [place] [facility] Type - - - Thoracic -  Patient Visit Type Other - - Other Initial  Treatment Phase Pre-Tx/Tx Discussion Abnormal Scans Abnormal Scans Abnormal Scans Abnormal Scans  Barriers/Navigation Needs Coordination of Care;Education/I followed up on Mark Yu's pathology.  Results are still pending and I called him to move his appt to an earlier time.  He verbalized understanding of new appt.  Coordination of Care Coordination of Care Coordination of Care Coordination of Care;Education  Education Other - - - Other  Interventions Coordination of Care;Education;Psycho-Social Support Coordination of Care Coordination of Care Coordination of Care Coordination of Care;Education;Psycho-Social Support  Acuity Level 2-Minimal Needs (1-2 Barriers Identified) Level 2-Minimal Needs (1-2 Barriers Identified) Level 2-Minimal Needs (1-2 Barriers Identified) Level 2-Minimal Needs (1-2 Barriers Identified) Level 2-Minimal Needs (1-2 Barriers Identified)  Coordination of Care Appts Other Other Other Appts  Education Method Verbal - - - Verbal  Time Spent with Patient 30 15 30 15  30

## 2022-02-05 ENCOUNTER — Encounter (HOSPITAL_COMMUNITY): Payer: Self-pay | Admitting: Emergency Medicine

## 2022-02-06 ENCOUNTER — Other Ambulatory Visit: Payer: Self-pay

## 2022-02-06 DIAGNOSIS — C3490 Malignant neoplasm of unspecified part of unspecified bronchus or lung: Secondary | ICD-10-CM

## 2022-02-09 ENCOUNTER — Inpatient Hospital Stay: Payer: Medicare Other | Attending: Internal Medicine | Admitting: Internal Medicine

## 2022-02-09 ENCOUNTER — Inpatient Hospital Stay: Payer: Medicare Other

## 2022-02-09 ENCOUNTER — Encounter: Payer: Self-pay | Admitting: *Deleted

## 2022-02-09 ENCOUNTER — Other Ambulatory Visit: Payer: Self-pay

## 2022-02-09 DIAGNOSIS — C3411 Malignant neoplasm of upper lobe, right bronchus or lung: Secondary | ICD-10-CM | POA: Diagnosis not present

## 2022-02-09 DIAGNOSIS — Z7952 Long term (current) use of systemic steroids: Secondary | ICD-10-CM | POA: Insufficient documentation

## 2022-02-09 DIAGNOSIS — F17210 Nicotine dependence, cigarettes, uncomplicated: Secondary | ICD-10-CM | POA: Diagnosis not present

## 2022-02-09 DIAGNOSIS — C3412 Malignant neoplasm of upper lobe, left bronchus or lung: Secondary | ICD-10-CM | POA: Insufficient documentation

## 2022-02-09 DIAGNOSIS — I10 Essential (primary) hypertension: Secondary | ICD-10-CM | POA: Diagnosis not present

## 2022-02-09 DIAGNOSIS — F419 Anxiety disorder, unspecified: Secondary | ICD-10-CM | POA: Diagnosis not present

## 2022-02-09 DIAGNOSIS — Z5111 Encounter for antineoplastic chemotherapy: Secondary | ICD-10-CM | POA: Diagnosis present

## 2022-02-09 DIAGNOSIS — Z79899 Other long term (current) drug therapy: Secondary | ICD-10-CM | POA: Diagnosis not present

## 2022-02-09 DIAGNOSIS — R634 Abnormal weight loss: Secondary | ICD-10-CM | POA: Diagnosis not present

## 2022-02-09 DIAGNOSIS — R630 Anorexia: Secondary | ICD-10-CM | POA: Insufficient documentation

## 2022-02-09 DIAGNOSIS — C3490 Malignant neoplasm of unspecified part of unspecified bronchus or lung: Secondary | ICD-10-CM

## 2022-02-09 LAB — CMP (CANCER CENTER ONLY)
ALT: 12 U/L (ref 0–44)
AST: 16 U/L (ref 15–41)
Albumin: 4.3 g/dL (ref 3.5–5.0)
Alkaline Phosphatase: 55 U/L (ref 38–126)
Anion gap: 6 (ref 5–15)
BUN: 10 mg/dL (ref 8–23)
CO2: 32 mmol/L (ref 22–32)
Calcium: 9.8 mg/dL (ref 8.9–10.3)
Chloride: 103 mmol/L (ref 98–111)
Creatinine: 0.63 mg/dL (ref 0.61–1.24)
GFR, Estimated: >60 mL/min (ref >60)
Glucose, Bld: 104 mg/dL — ABNORMAL HIGH (ref 70–99)
Potassium: 4 mmol/L (ref 3.5–5.1)
Sodium: 141 mmol/L (ref 135–145)
Total Bilirubin: 0.5 mg/dL (ref 0.3–1.2)
Total Protein: 7.6 g/dL (ref 6.5–8.1)

## 2022-02-09 LAB — CBC WITH DIFFERENTIAL (CANCER CENTER ONLY)
Abs Immature Granulocytes: 0.01 10*3/uL (ref 0.00–0.07)
Basophils Absolute: 0 10*3/uL (ref 0.0–0.1)
Basophils Relative: 0 %
Eosinophils Absolute: 0 10*3/uL (ref 0.0–0.5)
Eosinophils Relative: 1 %
HCT: 38.3 % — ABNORMAL LOW (ref 39.0–52.0)
Hemoglobin: 12.8 g/dL — ABNORMAL LOW (ref 13.0–17.0)
Immature Granulocytes: 0 %
Lymphocytes Relative: 26 %
Lymphs Abs: 1.4 10*3/uL (ref 0.7–4.0)
MCH: 30.1 pg (ref 26.0–34.0)
MCHC: 33.4 g/dL (ref 30.0–36.0)
MCV: 90.1 fL (ref 80.0–100.0)
Monocytes Absolute: 0.6 10*3/uL (ref 0.1–1.0)
Monocytes Relative: 12 %
Neutro Abs: 3.3 10*3/uL (ref 1.7–7.7)
Neutrophils Relative %: 61 %
Platelet Count: 262 10*3/uL (ref 150–400)
RBC: 4.25 MIL/uL (ref 4.22–5.81)
RDW: 19.2 % — ABNORMAL HIGH (ref 11.5–15.5)
WBC Count: 5.4 10*3/uL (ref 4.0–10.5)
nRBC: 0 % (ref 0.0–0.2)

## 2022-02-09 MED ORDER — LIDOCAINE-PRILOCAINE 2.5-2.5 % EX CREA: TOPICAL_CREAM | CUTANEOUS | 0 refills | Status: AC

## 2022-02-09 MED ORDER — METHYLPREDNISOLONE 4 MG PO TBPK: ORAL_TABLET | ORAL | 0 refills | Status: DC

## 2022-02-09 MED ORDER — PROCHLORPERAZINE MALEATE 10 MG PO TABS: 10.0000 mg | ORAL_TABLET | Freq: Four times a day (QID) | ORAL | 0 refills | Status: DC | PRN

## 2022-02-09 NOTE — Progress Notes (Signed)
[place] [phone]   Fax:([phone]) [phone]  OFFICE PROGRESS NOTE  Chow, Bebe Shaggy, MD [place] [address]  DIAGNOSIS: Limited stage (T1c, N1, M1a) small cell lung cancer presented with left upper lobe lung nodule as well as left hilar lymphadenopathy as well as right upper lobe lung nodule diagnosed in February 2023  PRIOR THERAPY: None  CURRENT THERAPY: Systemic chemotherapy with cisplatin 80 Mg/M2 on day 1 and 2 etoposide 100 Mg/M2 on days 1, 2 and 3 concurrent with radiotherapy.  First dose February 17, 2022.  INTERVAL HISTORY: Mark Yu 66 y.o. male returns to the clinic today for follow-up visit accompanied by his girlfriend.  The patient is feeling fine today with no concerning complaints except for the mild cough and shortness of breath with exertion.  He also still have a lack of appetite and requested refill of the Medrol Dosepak.  He had several studies since his previous visit including a PET scan as well as MRI of the brain and bronchoscopy under the care of Dr. Lamonte Sakai.  The biopsy from the right upper lobe as well as the left upper lobe and left hilar lymphadenopathy were all consistent with small cell lung cancer.  The patient has no current nausea, vomiting, diarrhea or constipation.  He has no headache or visual changes.  He has no significant fever or chills.  He has no chest pain or hemoptysis.  He is here today for evaluation and discussion of his treatment options based on the recent studies.  MEDICAL HISTORY: Past Medical History:  Diagnosis Date   Anxiety    tx xanax   COPD (chronic obstructive pulmonary disease) ([place])    tx inhalers   Depression    Dyspnea    with exertion   HTN (hypertension)    tx amlodipine    ALLERGIES:  is allergic to amoxicillin.  MEDICATIONS:  Current Outpatient Medications  Medication Sig Dispense Refill   acetaminophen (TYLENOL) 500 MG tablet Take 500 mg by mouth every 8  (eight) hours as needed for moderate pain or headache.     albuterol (VENTOLIN HFA) 108 (90 Base) MCG/ACT inhaler Inhale 2 puffs into the lungs every 4 (four) hours as needed for wheezing or shortness of breath.     ALPRAZolam (XANAX) 1 MG tablet Take 0.5-1 tablets (0.5-1 mg total) by mouth 2 (two) times daily as needed for anxiety.     amLODipine (NORVASC) 5 MG tablet Take 5 mg by mouth daily.     BREZTRI AEROSPHERE 160-9-4.8 MCG/ACT AERO Inhale 2 puffs into the lungs in the morning and at bedtime.     Calcium Carb-Cholecalciferol 500-10 MG-MCG TABS Take 1 tablet by mouth daily.     cholecalciferol (VITAMIN D) 25 MCG (1000 UNIT) tablet Take 1,000 Units by mouth daily.     fluticasone (FLONASE) 50 MCG/ACT nasal spray Place 2 sprays into both nostrils daily as needed for allergies or rhinitis.     folic acid (FOLVITE) 532 MCG tablet Take 400 mcg by mouth daily.     guaiFENesin (MUCINEX) 600 MG 12 hr tablet Take 600 mg by mouth every evening.     magnesium oxide (MAG-OX) 400 MG tablet Take 400 mg by mouth daily.     vitamin B-12 (CYANOCOBALAMIN) 1000 MCG tablet Take 1,000 mcg by mouth daily.     No current facility-administered medications for this visit.    SURGICAL HISTORY:  Past Surgical History:  Procedure Laterality Date  BRONCHIAL BIOPSY  02/02/2022   Procedure: BRONCHIAL BIOPSIES;  Surgeon: Collene Gobble, MD;  Location: [facility] ENDOSCOPY;  Service: Pulmonary;;   BRONCHIAL BRUSHINGS  02/02/2022   Procedure: BRONCHIAL BRUSHINGS;  Surgeon: Collene Gobble, MD;  Location[facility] ENDOSCOPY;  Service: Pulmonary;;   BRONCHIAL NEEDLE ASPIRATION BIOPSY  02/02/2022   Procedure: BRONCHIAL NEEDLE ASPIRATION BIOPSIES;  Surgeon: Collene Gobble, MD;  Location: [facility] ENDOSCOPY;  Service: Pulmonary;;   MRI     NO PAST SURGERIES     VIDEO BRONCHOSCOPY WITH ENDOBRONCHIAL ULTRASOUND N/A 02/02/2022   Procedure: VIDEO BRONCHOSCOPY WITH ENDOBRONCHIAL ULTRASOUND;  Surgeon: Collene Gobble, MD;  Location: [facility] ENDOSCOPY;   Service: Pulmonary;  Laterality: N/A;   VIDEO BRONCHOSCOPY WITH RADIAL ENDOBRONCHIAL ULTRASOUND  02/02/2022   Procedure: VIDEO BRONCHOSCOPY WITH RADIAL ENDOBRONCHIAL ULTRASOUND;  Surgeon: Collene Gobble, MD;  Location: [facility] ENDOSCOPY;  Service: Pulmonary;;    REVIEW OF SYSTEMS:  Constitutional: positive for anorexia, fatigue, and weight loss Eyes: negative Ears, nose, mouth, throat, and face: negative Respiratory: positive for cough and dyspnea on exertion Cardiovascular: negative Gastrointestinal: negative Genitourinary:negative Integument/breast: negative Hematologic/lymphatic: negative Musculoskeletal:negative Neurological: negative Behavioral/Psych: negative Endocrine: negative Allergic/Immunologic: negative   PHYSICAL EXAMINATION: General appearance: alert, cooperative, appears older than stated age, fatigued, and no distress Head: Normocephalic, without obvious abnormality, atraumatic Neck: no adenopathy, no JVD, supple, symmetrical, trachea midline, and thyroid not enlarged, symmetric, no tenderness/mass/nodules Lymph nodes: Cervical, supraclavicular, and axillary nodes normal. Resp: clear to auscultation bilaterally Back: symmetric, no curvature. ROM normal. No CVA tenderness. Cardio: regular rate and rhythm, S1, S2 normal, no murmur, click, rub or gallop GI: soft, non-tender; bowel sounds normal; no masses,  no organomegaly Extremities: extremities normal, atraumatic, no cyanosis or edema Neurologic: Alert and oriented X 3, normal strength and tone. Normal symmetric reflexes. Normal coordination and gait  ECOG PERFORMANCE STATUS: 1 - Symptomatic but completely ambulatory  Blood pressure (!) 160/99, pulse (!) 104, temperature (!) 97.1 F (36.2 C), temperature source Tympanic, resp. rate 20, height 5\' 11"  (1.803 m), weight 109 lb 8 oz (49.7 kg), SpO2 96 %.  LABORATORY DATA: Lab Results  Component Value Date   WBC 5.4 02/09/2022   HGB 12.8 (L) 02/09/2022   HCT 38.3 (L)  02/09/2022   MCV 90.1 02/09/2022   PLT 262 02/09/2022      Chemistry      Component Value Date/Time   NA 134 (L) 01/08/2022 1437   K 4.7 01/08/2022 1437   CL 98 01/08/2022 1437   CO2 27 01/08/2022 1437   BUN 19 01/08/2022 1437   CREATININE 0.79 01/08/2022 1437      Component Value Date/Time   CALCIUM 9.2 01/08/2022 1437   ALKPHOS 56 01/08/2022 1437   AST 27 01/08/2022 1437   ALT 21 01/08/2022 1437   BILITOT 0.6 01/08/2022 1437       RADIOGRAPHIC STUDIES: MR BRAIN W WO CONTRAST  Result Date: 01/21/2022 CLINICAL DATA:  Non-small cell lung cancer, staging EXAM: MRI HEAD WITHOUT AND WITH CONTRAST TECHNIQUE: Multiplanar, multiecho pulse sequences of the brain and surrounding structures were obtained without and with intravenous contrast. CONTRAST:  16mL GADAVIST GADOBUTROL 1 MMOL/ML IV SOLN COMPARISON:  None. FINDINGS: Brain: No abnormal enhancement or cerebral edema. No restricted diffusion to suggest acute or subacute infarct. No acute hemorrhage, mass mass effect, or midline shift. No hydrocephalus or extra-axial collection. T2 hyperintense signal in the periventricular white matter and pons, likely the sequela of chronic small vessel ischemic disease. Remote lacunar infarcts in  the bilateral lentiform nuclei/external capsules and left caudate head, which are associated with areas of hemosiderin deposition, likely perilesional hemorrhage. Focus of hemosiderin deposition in the posterior lentiform nucleus, likely sequela of prior hypertensive microhemorrhage. Vascular: Normal flow voids. Skull and upper cervical spine: Normal marrow signal. Sinuses/Orbits: Negative. Other: The mastoids are well aerated. IMPRESSION: No acute intracranial process. No evidence of metastatic disease in the brain. Electronically Signed   By: Merilyn Baba M.D.   On: 01/21/2022 23:17   NM PET Image Initial (PI) Skull Base To Thigh (F-18 FDG)  Result Date: 01/21/2022 CLINICAL DATA:  Initial treatment strategy  for suspicious pulmonary nodule. EXAM: NUCLEAR MEDICINE PET SKULL BASE TO THIGH TECHNIQUE: 6.4 mCi F-18 FDG was injected intravenously. Full-ring PET imaging was performed from the skull base to thigh after the radiotracer. CT data was obtained and used for attenuation correction and anatomic localization. Fasting blood glucose: 84 mg/dl COMPARISON:  CT 01/06/2022 FINDINGS: Mediastinal blood pool activity: SUV max 1.38 Liver activity: SUV max NA NECK: No hypermetabolic lymph nodes in the neck. Incidental CT findings: none CHEST: Elongated lobular nodule in the LEFT upper lobe measures 1.7 x 1.1 cm (image 87/series 4) and has intense metabolic activity SUV max equal 10.1. this is the nodule of concern comparison CT. Enlarged intensely hypermetabolic LEFT hilar lymph node measures 15 mm with SUV max equal 11.1. No hypermetabolic mediastinal nodes or supraclavicular nodes Rounded nodule in the RIGHT upper lobe measures 9 mm with faint metabolic activity (SUV max equal 0.7.). Incidental CT findings: none ABDOMEN/PELVIS: No hypermetabolic activity the adrenal glands. No abnormal activity in liver. No abdominopelvic lymph nodes There is intense metabolic activity associated with a 4 cm segment of proximal sigmoid colon with SUV max equal 9.3 (image 177). There is bowel wall thickening through this region without clear mass lesion. There multiple diverticula through this region. No bowel obstruction. Incidental CT findings: none SKELETON: No focal activity to suggest skeletal metastasis. No lytic or sclerotic lesions. Incidental CT findings: none IMPRESSION: 1. 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 2. ipsilateral LEFT hilar nodal metastasis. 3. No mediastinal nodal metastasis or supraclavicular nodal metastasis. 4. No distant metastatic disease 5. Nodule in the RIGHT upper lobe does not have significant metabolic activity. Recommend attention on follow-up. 6. Centrilobular  emphysema in the upper lobes. 7. A segment of intense metabolic activity associated the proximal sigmoid colon. 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 Recommend colonoscopy if patient is not current for screening. Electronically Signed   By: Suzy Bouchard M.D.   On: 01/21/2022 14:47   DG Chest Port 1 View  Result Date: 02/02/2022 CLINICAL DATA:  Status post bronchoscopy. EXAM: PORTABLE CHEST 1 VIEW COMPARISON:  January 06, 2022. FINDINGS: The heart size and mediastinal contours are within normal limits. Hyperexpansion of the lungs is noted. No pneumothorax or pleural effusion is noted. Bilateral pulmonary nodules are again noted. The visualized skeletal structures are unremarkable. IMPRESSION: No pneumothorax is noted. Hyperexpansion of the lungs is noted. Bilateral pulmonary nodules are again noted. Electronically Signed   By: Marijo Conception M.D.   On: 02/02/2022 13:35   DG C-Arm 1-60 Min-No Report  Result Date: 02/02/2022 Fluoroscopy was utilized by the requesting physician.  No radiographic interpretation.   DG C-ARM BRONCHOSCOPY  Result Date: 02/02/2022 C-ARM BRONCHOSCOPY: Fluoroscopy was utilized by the requesting physician.  No radiographic interpretation.    ASSESSMENT AND PLAN: This is a very pleasant 66 years old white male recently  diagnosed with small cell lung cancer likely extensive stage (T1c, N1, M1 a) disease diagnosed and February 2023 with the bilateral nodules in the right upper lobe as well as the left upper lobe but this could be also treated as limited stage disease because of the proximity of the lesions. I had a lengthy discussion with the patient today about his current disease stage, prognosis and treatment options. I recommended for the patient a course of systemic chemotherapy with cisplatin 80 Mg/M2 on day 1 and etoposide 100 Mg/M2 on days 1, 2 and 3 every 3 weeks for 4 cycles.  This will be concurrent with radiotherapy. I explained to  the patient the adverse effect of this treatment including but not limited to alopecia, myelosuppression, nausea and vomiting, peripheral neuropathy, liver or renal dysfunction as well as a hearing deficit. I will refer the patient to radiation oncology for discussion of the radiotherapy option. The patient will have a chemotherapy education class before the first dose of his treatment. I will also arrange for the patient to have a Port-A-Cath placed by interventional radiology. He is expected to start the first cycle of this treatment on February 17, 2022. I will see the patient back for follow-up visit 1 week after the treatment for management of any adverse effect of his treatment. I will call his pharmacy with prescription for Compazine 10 mg p.o. every 6 hours as needed for nausea in addition to Emla cream to be applied to the Port-A-Cath site before treatment. For the lack of appetite and weight loss, I will give the patient another Medrol Dosepak. The patient was advised to call immediately if he has any other concerning symptoms in the interval. The patient voices understanding of current disease status and treatment options and is in agreement with the current care plan.  All questions were answered. The patient knows to call the clinic with any problems, questions or concerns. We can certainly see the patient much sooner if necessary.  The total time spent in the appointment was 40 minutes.  Disclaimer: This note was dictated with voice recognition software. Similar sounding words can inadvertently be transcribed and may not be corrected upon review.

## 2022-02-09 NOTE — Patient Instructions (Addendum)
Carboplatin injection What is this medication? CARBOPLATIN (KAR boe pla tin) is a chemotherapy drug. It targets fast dividing cells, like cancer cells, and causes these cells to die. This medicine is used to treat ovarian cancer and many other cancers. This medicine may be used for other purposes; ask your health care provider or pharmacist if you have questions. COMMON BRAND NAME(S): Paraplatin What should I tell my care team before I take this medication? They need to know if you have any of these conditions: blood disorders hearing problems kidney disease recent or ongoing radiation therapy an unusual or allergic reaction to carboplatin, cisplatin, other chemotherapy, other medicines, foods, dyes, or preservatives pregnant or trying to get pregnant breast-feeding How should I use this medication? This drug is usually given as an infusion into a vein. It is administered in a hospital or clinic by a specially trained health care professional. Talk to your pediatrician regarding the use of this medicine in children. Special care may be needed. Overdosage: If you think you have taken too much of this medicine contact a poison control center or emergency room at once. NOTE: This medicine is only for you. Do not share this medicine with others. What if I miss a dose? It is important not to miss a dose. Call your doctor or health care professional if you are unable to keep an appointment. What may interact with this medication? medicines for seizures medicines to increase blood counts like filgrastim, pegfilgrastim, sargramostim some antibiotics like amikacin, gentamicin, neomycin, streptomycin, tobramycin vaccines Talk to your doctor or health care professional before taking any of these medicines: acetaminophen aspirin ibuprofen ketoprofen naproxen This list may not describe all possible interactions. Give your health care provider a list of all the medicines, herbs, non-prescription  drugs, or dietary supplements you use. Also tell them if you smoke, drink alcohol, or use illegal drugs. Some items may interact with your medicine. What should I watch for while using this medication? Your condition will be monitored carefully while you are receiving this medicine. You will need important blood work done while you are taking this medicine. This drug may make you feel generally unwell. This is not uncommon, as chemotherapy can affect healthy cells as well as cancer cells. Report any side effects. Continue your course of treatment even though you feel ill unless your doctor tells you to stop. In some cases, you may be given additional medicines to help with side effects. Follow all directions for their use. Call your doctor or health care professional for advice if you get a fever, chills or sore throat, or other symptoms of a cold or flu. Do not treat yourself. This drug decreases your body's ability to fight infections. Try to avoid being around people who are sick. This medicine may increase your risk to bruise or bleed. Call your doctor or health care professional if you notice any unusual bleeding. Be careful brushing and flossing your teeth or using a toothpick because you may get an infection or bleed more easily. If you have any dental work done, tell your dentist you are receiving this medicine. Avoid taking products that contain aspirin, acetaminophen, ibuprofen, naproxen, or ketoprofen unless instructed by your doctor. These medicines may hide a fever. Do not become pregnant while taking this medicine. Women should inform their doctor if they wish to become pregnant or think they might be pregnant. There is a potential for serious side effects to an unborn child. Talk to your health care professional or pharmacist  for more information. Do not breast-feed an infant while taking this medicine. What side effects may I notice from receiving this medication? Side effects that you  should report to your doctor or health care professional as soon as possible: allergic reactions like skin rash, itching or hives, swelling of the face, lips, or tongue signs of infection - fever or chills, cough, sore throat, pain or difficulty passing urine signs of decreased platelets or bleeding - bruising, pinpoint red spots on the skin, black, tarry stools, nosebleeds signs of decreased red blood cells - unusually weak or tired, fainting spells, lightheadedness breathing problems changes in hearing changes in vision chest pain high blood pressure low blood counts - This drug may decrease the number of white blood cells, red blood cells and platelets. You may be at increased risk for infections and bleeding. nausea and vomiting pain, swelling, redness or irritation at the injection site pain, tingling, numbness in the hands or feet problems with balance, talking, walking trouble passing urine or change in the amount of urine Side effects that usually do not require medical attention (report to your doctor or health care professional if they continue or are bothersome): hair loss loss of appetite metallic taste in the mouth or changes in taste This list may not describe all possible side effects. Call your doctor for medical advice about side effects. You may report side effects to FDA at 1-800-FDA-1088. Where should I keep my medication? This drug is given in a hospital or clinic and will not be stored at home. NOTE: This sheet is a summary. It may not cover all possible information. If you have questions about this medicine, talk to your doctor, pharmacist, or health care provider.  2022 Elsevier/Gold Standard (2008-05-23 00:00:00) Etoposide, VP-16 injection What is this medication? ETOPOSIDE, VP-16 (e toe POE side) is a chemotherapy drug. It is used to treat testicular cancer, lung cancer, and other cancers. This medicine may be used for other purposes; ask your health care  provider or pharmacist if you have questions. COMMON BRAND NAME(S): Etopophos, Toposar, VePesid What should I tell my care team before I take this medication? They need to know if you have any of these conditions: infection kidney disease liver disease low blood counts, like low white cell, platelet, or red cell counts an unusual or allergic reaction to etoposide, other medicines, foods, dyes, or preservatives pregnant or trying to get pregnant breast-feeding How should I use this medication? This medicine is for infusion into a vein. It is administered in a hospital or clinic by a specially trained health care professional. Talk to your pediatrician regarding the use of this medicine in children. Special care may be needed. Overdosage: If you think you have taken too much of this medicine contact a poison control center or emergency room at once. NOTE: This medicine is only for you. Do not share this medicine with others. What if I miss a dose? It is important not to miss your dose. Call your doctor or health care professional if you are unable to keep an appointment. What may interact with this medication? This medicine may interact with the following medications: warfarin This list may not describe all possible interactions. Give your health care provider a list of all the medicines, herbs, non-prescription drugs, or dietary supplements you use. Also tell them if you smoke, drink alcohol, or use illegal drugs. Some items may interact with your medicine. What should I watch for while using this medication? Visit your doctor for  checks on your progress. This drug may make you feel generally unwell. This is not uncommon, as chemotherapy can affect healthy cells as well as cancer cells. Report any side effects. Continue your course of treatment even though you feel ill unless your doctor tells you to stop. In some cases, you may be given additional medicines to help with side effects. Follow  all directions for their use. Call your doctor or health care professional for advice if you get a fever, chills or sore throat, or other symptoms of a cold or flu. Do not treat yourself. This drug decreases your body's ability to fight infections. Try to avoid being around people who are sick. This medicine may increase your risk to bruise or bleed. Call your doctor or health care professional if you notice any unusual bleeding. Talk to your doctor about your risk of cancer. You may be more at risk for certain types of cancers if you take this medicine. Do not become pregnant while taking this medicine or for at least 6 months after stopping it. Women should inform their doctor if they wish to become pregnant or think they might be pregnant. Women of child-bearing potential will need to have a negative pregnancy test before starting this medicine. There is a potential for serious side effects to an unborn child. Talk to your health care professional or pharmacist for more information. Do not breast-feed an infant while taking this medicine. Men must use a latex condom during sexual contact with a woman while taking this medicine and for at least 4 months after stopping it. A latex condom is needed even if you have had a vasectomy. Contact your doctor right away if your partner becomes pregnant. Do not donate sperm while taking this medicine and for at least 4 months after you stop taking this medicine. Men should inform their doctors if they wish to father a child. This medicine may lower sperm counts. What side effects may I notice from receiving this medication? Side effects that you should report to your doctor or health care professional as soon as possible: allergic reactions like skin rash, itching or hives, swelling of the face, lips, or tongue low blood counts - this medicine may decrease the number of white blood cells, red blood cells, and platelets. You may be at increased risk for infections  and bleeding nausea, vomiting redness, blistering, peeling or loosening of the skin, including inside the mouth signs and symptoms of infection like fever; chills; cough; sore throat; pain or trouble passing urine signs and symptoms of low red blood cells or anemia such as unusually weak or tired; feeling faint or lightheaded; falls; breathing problems unusual bruising or bleeding Side effects that usually do not require medical attention (report to your doctor or health care professional if they continue or are bothersome): changes in taste diarrhea hair loss loss of appetite mouth sores This list may not describe all possible side effects. Call your doctor for medical advice about side effects. You may report side effects to FDA at 1-800-FDA-1088. Where should I keep my medication? This drug is given in a hospital or clinic and will not be stored at home. NOTE: This sheet is a summary. It may not cover all possible information. If you have questions about this medicine, talk to your doctor, pharmacist, or health care provider.  2022 Elsevier/Gold Standard (2021-09-02 00:00:00) Chemotherapy Chemotherapy is a cancer treatment. It uses medicines to slow down or stop the growth of cancer. You may have  chemotherapy to: Cure your cancer. Prevent the cancer from growing or spreading (metastasizing). Ease symptoms and improve your quality of life (palliative care). Improve the effects of radiation treatment. Shrink a tumor before surgery. Rid the body of cancer cells that remain after having a tumor surgically removed. The length of chemotherapy treatment depends on many factors, including: The type and stage of your cancer. How you respond to the chemotherapy. Your side effects. What are the risks? Generally, this is a safe treatment. However, problems may occur, including: Infection. Bleeding at the IV site. Allergic reactions to medicines. You may have side effects from chemotherapy.  What side effects you have depends on a variety of factors, including: The type of chemotherapy medicine used. Your dosage. How long the medicine is used for. Your overall health. What happens before treatment? You will meet with your cancer care team to discuss: How your chemotherapy medicine will be given. Common side effects and how to manage them. Your treatment schedule. You may have blood tests. You may be given medicine to help prevent common side effects. What happens during treatment? Chemotherapy may be given continuously over time, or it may be given in cycles. Some common ways chemotherapy may be given include: As a pill or capsule. As an injection. As a skin (topical) cream. As a special wafer that is put in your body where the cancer is. As an injection into the cerebrospinal fluid (CSF) in the brain or spinal cord (intraventricular or intrathecal chemotherapy). Through a small, thin tube (catheter). There are different kinds of catheters. You might have one that: Goes into a vein (intravenous catheter). Connects to a device (port) that is inserted under the skin of your chest (port catheter). A port catheter connects the port to a large vein in your chest or upper arm. The port may stay in place for many weeks or months. Goes into a vein near your elbow (PICC line). This may be used for weeks or months. Goes into a vein in your neck that leads to your heart (non-tunneled catheter). This catheter has a risk of infection, so it is used for only a short time. Goes through the skin of your chest and into a large vein that leads to your heart (tunneled catheter). This catheter can stay in your body for months or years. While you are receiving your medicine, your cancer care team will monitor your blood pressure, heart rate, breathing rate, and blood oxygen level (vital signs) and watch for any problems. Some types of chemotherapy medicine are given only one time. Others are given  for months, years, or for life. What can I expect after treatment? After chemotherapy, you may have side effects, such as: Nausea and vomiting. Appetite loss. Constipation or diarrhea. Fatigue. Increased risk of infections, bruising, or bleeding. Hair loss. Mouth or throat sores. Tingling, pain, or numbness in the hands and feet. Dry, sensitive, itchy, or sore skin. Memory changes. Follow these instructions at home: General instructions  If you get chemotherapy through an IV, PICC line, or port, check the site every day for signs of infection. Check for redness, swelling, pain, fluid, or warmth. Wash your hands frequently with soap and water. If soap and water are not available, use hand sanitizer. Have other members of your household wash their hands often. Chemotherapy medicines leave the body through urine or stool (feces), but they can also be present in other body fluids including vomit, tears, vaginal fluids, and semen. You must carefully follow some safety  precautions to prevent harm to others while you are taking these medicines: Wash any clothes, towels, and linens that may have your bodily fluids on them twice in a washing machine. Use a condom during vaginal, anal, and oral sex while you are taking chemotherapy medicines and for 48 hours after your last dose. Practice good bathroom hygiene: Always sit when using the toilet. Close the toilet seat lid before you flush. Wash your hands thoroughly with soap and water after each time you use the toilet. Keep all follow-up visits as told by your cancer care team. This is important. Eating and drinking Talk with a dietitian about what you should eat and drink during cancer treatment. Always wash fresh fruits and vegetables well before eating them. Drink enough fluid to keep urine pale yellow. Medicines Take over-the-counter and prescription medicines only as told by your health care provider. Talk with your health care provider  before taking vitamins and supplements. Some can interfere with chemotherapy. Activity Get plenty of rest. Get regular exercise such as walking, gentle yoga, or tai chi. Return to your normal activities as told by your health care provider. Ask your health care provider what activities are safe for you. Contact a health care provider if: You have: A skin rash that does not go away. A headache. A stiff neck. A cough. Cold or flu symptoms. A burning feeling when urinating. Urine that smells bad. Diarrhea. Nausea. Vomiting. Blood in your urine or stool. You bleed or bruise often. You urinate more frequently than usual. You cannot eat because of mouth or throat pain. Get help right away if: You have: A fever. Redness, swelling, pain, fluid, or warmth near your IV site. Bleeding that does not stop. A seizure. Chest pain. Difficulty breathing. You cannot swallow. Summary Chemotherapy is a way to treat cancer. It uses medicines to slow down or stop the growth of cancer. Before treatment, you and your cancer care team will discuss common side effects and how to manage them. The way that you will get chemotherapy medicines depends on your condition. Take over-the-counter and prescription medicines only as told by your health care provider. This information is not intended to replace advice given to you by your health care provider. Make sure you discuss any questions you have with your health care provider. Document Revised: 05/22/2020 Document Reviewed: 07/05/2020 Elsevier Patient Education  2022 [place] Lung cancer is an abnormal growth of cancerous cells that forms a mass (malignant tumor) in a lung. There are several types of lung cancer. The types are based on the appearance of the tumor cells. The two most common types are: Non-small cell lung cancer. This type of lung cancer is the most common type. Non-small cell lung cancers include squamous cell carcinoma,  adenocarcinoma, and large cell carcinoma. Small cell lung cancer. In this type of lung cancer, abnormal cells are smaller than those of non-small cell lung cancer. Small cell lung cancer gets worse (progresses) faster than non-small cell lung cancer. What are the causes? The most common cause of lung cancer is smoking tobacco. The second most common cause is exposure to a chemical called radon. What increases the risk? You are more likely to develop this condition if: You smoke tobacco. You have been exposed to: Secondhand tobacco smoke. Radon gas. Uranium. Asbestos. Arsenic in drinking water. Air pollution and diesel exhaust. You have a family or personal history of lung cancer. You have had lung radiation therapy in the past. You are older  than age 40. What are the signs or symptoms? In the early stages, you may not have any symptoms. As the cancer progresses, symptoms may include: A lasting cough, possibly with blood. Fatigue. Unexplained weight loss. Shortness of breath. High-pitched whistling sounds when you breathe, most often when you breathe out (wheezing). Chest pain. Loss of appetite. Symptoms of advanced lung cancer include: Hoarseness. Bone or joint pain. Weakness. Change in the structure of the fingernails (clubbing), so that the nail looks like an upside-down spoon. Swelling of the face or arms. Inability to move the face (paralysis). Drooping eyelids. How is this diagnosed? This condition may be diagnosed based on: Your symptoms and medical history. A physical exam. A chest X-ray. A CT scan. Blood tests. Sputum tests. Removal of a sample of lung tissue (lung biopsy) for testing. Your cancer will be assessed (staged) to determine how severe it is and how much it has spread (metastasized). How is this treated? Treatment depends on the type and stage of your cancer. Treatment may include one or more of the following: Surgery to remove as much of the cancer as  possible. Lymph nodes in the area may be removed and tested for cancer as well. Medicines that kill cancer cells (chemotherapy). High-energy rays that kill cancer cells (radiation therapy). Targeted therapy. This targets specific parts of cancer cells and the area around them to block the growth and spread of the cancer. Targeted therapy can help limit the damage to healthy cells. Immunotherapy. This treatment uses a person's own immune system to fight cancer by either boosting the immune system or changing how the immune system works. Follow these instructions at home:  Do not use any products that contain nicotine or tobacco. These products include cigarettes, chewing tobacco, and vaping devices, such as e-cigarettes. If you need help quitting, ask your health care provider. Do not drink alcohol. If you are admitted to the hospital, make sure your cancer specialist (oncologist) is aware. Your cancer may affect your treatment for other conditions. Take over-the-counter and prescription medicines only as told by your health care provider. Work with your health care provider to manage any side effects of treatment. Keep all follow-up visits. This is important. Where to find support Consider joining a local support group for people who have been diagnosed with lung cancer. Where to find more information American Cancer Society: www.cancer.[place]): www.cancer.gov Contact a health care provider if you: Lose weight without trying. Have a persistent cough and wheezing. Feel short of breath. Get tired easily. Have bone or joint pain. Have difficulty swallowing. Notice that your voice is changing or getting hoarse. Have pain that does not get better with medicine. Get help right away if you: Cough up blood. Have chest pain or new breathing problems. Have a fever. Have swelling in an ankle, leg, or arm, or the face or neck. Have paralysis in your face. Are very  confused. Have a drooping eyelid. These symptoms may represent a serious problem that is an emergency. Do not wait to see if the symptoms will go away. Get medical help right away. Call your local emergency services (911 in the [place] Do not drive yourself to the hospital. Summary Lung cancer is an abnormal growth of cancerous cells that forms a mass (malignant tumor) in a lung. There are several types of lung cancer. The types are based on the appearance of the tumor cells. The two most common types are non-small cell and small cell. The most common  cause of lung cancer is smoking tobacco. Early symptoms include a lasting cough, possibly with blood, and fatigue, unexplained weight loss, and shortness of breath. After diagnosis, treatment depends on the type and stage of your cancer. This information is not intended to replace advice given to you by your health care provider. Make sure you discuss any questions you have with your health care provider. Document Revised: 06/04/2021 Document Reviewed: 06/04/2021 Elsevier Patient Education  [place]. Cisplatin injection What is this medication? CISPLATIN (SIS pla tin) is a chemotherapy drug. It targets fast dividing cells, like cancer cells, and causes these cells to die. This medicine is used to treat many types of cancer like bladder, ovarian, and testicular cancers. This medicine may be used for other purposes; ask your health care provider or pharmacist if you have questions. COMMON BRAND NAME(S): Platinol, Platinol -AQ What should I tell my care team before I take this medication? They need to know if you have any of these conditions: eye disease, vision problems hearing problems kidney disease low blood counts, like white cells, platelets, or red blood cells tingling of the fingers or toes, or other nerve disorder an unusual or allergic reaction to cisplatin, carboplatin, oxaliplatin, other medicines, foods, dyes, or  preservatives pregnant or trying to get pregnant breast-feeding How should I use this medication? This drug is given as an infusion into a vein. It is administered in a hospital or clinic by a specially trained health care professional. Talk to your pediatrician regarding the use of this medicine in children. Special care may be needed. Overdosage: If you think you have taken too much of this medicine contact a poison control center or emergency room at once. NOTE: This medicine is only for you. Do not share this medicine with others. What if I miss a dose? It is important not to miss a dose. Call your doctor or health care professional if you are unable to keep an appointment. What may interact with this medication? This medicine may interact with the following medications: foscarnet certain antibiotics like amikacin, gentamicin, neomycin, polymyxin B, streptomycin, tobramycin, vancomycin This list may not describe all possible interactions. Give your health care provider a list of all the medicines, herbs, non-prescription drugs, or dietary supplements you use. Also tell them if you smoke, drink alcohol, or use illegal drugs. Some items may interact with your medicine. What should I watch for while using this medication? Your condition will be monitored carefully while you are receiving this medicine. You will need important blood work done while you are taking this medicine. This drug may make you feel generally unwell. This is not uncommon, as chemotherapy can affect healthy cells as well as cancer cells. Report any side effects. Continue your course of treatment even though you feel ill unless your doctor tells you to stop. This medicine may increase your risk of getting an infection. Call your healthcare professional for advice if you get a fever, chills, or sore throat, or other symptoms of a cold or flu. Do not treat yourself. Try to avoid being around people who are sick. Avoid taking  medicines that contain aspirin, acetaminophen, ibuprofen, naproxen, or ketoprofen unless instructed by your healthcare professional. These medicines may hide a fever. This medicine may increase your risk to bruise or bleed. Call your doctor or health care professional if you notice any unusual bleeding. Be careful brushing and flossing your teeth or using a toothpick because you may get an infection or bleed more easily.  If you have any dental work done, tell your dentist you are receiving this medicine. Do not become pregnant while taking this medicine or for 14 months after stopping it. Women should inform their healthcare professional if they wish to become pregnant or think they might be pregnant. Men should not father a child while taking this medicine and for 11 months after stopping it. There is potential for serious side effects to an unborn child. Talk to your healthcare professional for more information. Do not breast-feed an infant while taking this medicine. This medicine has caused ovarian failure in some women. This medicine may make it more difficult to get pregnant. Talk to your healthcare professional if you are concerned about your fertility. This medicine has caused decreased sperm counts in some men. This may make it more difficult to father a child. Talk to your healthcare professional if you are concerned about your fertility. Drink fluids as directed while you are taking this medicine. This will help protect your kidneys. Call your doctor or health care professional if you get diarrhea. Do not treat yourself. What side effects may I notice from receiving this medication? Side effects that you should report to your doctor or health care professional as soon as possible: allergic reactions like skin rash, itching or hives, swelling of the face, lips, or tongue blurred vision changes in vision decreased hearing or ringing of the ears nausea, vomiting pain, redness, or irritation  at site where injected pain, tingling, numbness in the hands or feet signs and symptoms of bleeding such as bloody or black, tarry stools; red or dark brown urine; spitting up blood or brown material that looks like coffee grounds; red spots on the skin; unusual bruising or bleeding from the eyes, gums, or nose signs and symptoms of infection like fever; chills; cough; sore throat; pain or trouble passing urine signs and symptoms of kidney injury like trouble passing urine or change in the amount of urine signs and symptoms of low red blood cells or anemia such as unusually weak or tired; feeling faint or lightheaded; falls; breathing problems Side effects that usually do not require medical attention (report to your doctor or health care professional if they continue or are bothersome): loss of appetite mouth sores muscle cramps This list may not describe all possible side effects. Call your doctor for medical advice about side effects. You may report side effects to FDA at 1-800-FDA-1088. Where should I keep my medication? This drug is given in a hospital or clinic and will not be stored at home. NOTE: This sheet is a summary. It may not cover all possible information. If you have questions about this medicine, talk to your doctor, pharmacist, or health care provider.  2022 [employer] Standard (2021-09-02 00:00:00)

## 2022-02-09 NOTE — Progress Notes (Signed)
Oncology Nurse Navigator Documentation  Oncology Nurse Navigator Flowsheets 02/09/2022 02/04/2022 01/14/2022 01/13/2022 01/09/2022 01/06/2022  Abnormal Finding Date - - - - - 01/05/2022  Confirmed Diagnosis Date - 02/02/2022 - - - -  Diagnosis Status Confirmed Diagnosis Complete Pathology Pending - - - Additional Work Up  Navigator Follow Up Date: 02/12/2022 02/09/2022 01/23/2022 01/16/2022 01/12/2022 01/08/2022  Navigator Follow Up Reason: Appointment Review Follow-up After Biopsy Radiology Appointment Review Appointment Review New Patient Appointment  Navigator Location [facility]  Referral Date to RadOnc/MedOnc - - - - - 01/06/2022  Navigator Encounter Type Clinic/MDC Telephone Other: Other: Other: Telephone  Telephone - Outgoing Call - - - Outgoing Call  [facility] Date - - - - 01/08/2022 -  [facility] Type - - - - Thoracic -  Patient Visit Type MedOnc Other - - Other Initial  Treatment Phase Pre-Tx/Tx Discussion Pre-Tx/Tx Discussion Abnormal Scans Abnormal Scans Abnormal Scans Abnormal Scans  Barriers/Navigation Needs Education/I spoke with patient today at his follow up appt to see Dr. Julien Nordmann. He is newly dx small cell lung cancer. His plan of care is concurrent chemo rad. I helped educate on treatment and medications he will receive. I also added information about medication and dx on AVS.  Coordination of Care;Education Coordination of Care Coordination of Care Coordination of Care Coordination of Care;Education  Education Newly Diagnosed Cancer Education;Other Other - - - Other  Interventions Coordination of Care;Psycho-Social Support Coordination of Care;Education;Psycho-Social Support Coordination of Care Coordination of Care Coordination of Care Coordination of Care;Education;Psycho-Social Support  Acuity Level 3-Moderate Needs (3-4 Barriers Identified) Level 2-Minimal Needs (1-2 Barriers  Identified) Level 2-Minimal Needs (1-2 Barriers Identified) Level 2-Minimal Needs (1-2 Barriers Identified) Level 2-Minimal Needs (1-2 Barriers Identified) Level 2-Minimal Needs (1-2 Barriers Identified)  Coordination of Care - Appts Other Other Other Appts  Education Method Verbal;Other Verbal - - - Verbal  Time Spent with Patient 30 30 15 30 15  30

## 2022-02-09 NOTE — Progress Notes (Signed)
START ON PATHWAY REGIMEN - Small Cell Lung     A cycle is every 21 days:     Etoposide      Cisplatin   **Always confirm dose/schedule in your pharmacy ordering system**  Patient Characteristics: Newly Diagnosed, Preoperative or Nonsurgical Candidate (Clinical Staging), First Line, Limited Stage, Nonsurgical Candidate Therapeutic Status: Newly Diagnosed, Preoperative or Nonsurgical Candidate (Clinical Staging) AJCC T Category: cT1c AJCC N Category: cN1 AJCC M Category: cM1a AJCC 8 Stage Grouping: IVA Stage Classification: Limited Surgical Candidacy: Nonsurgical Candidate Intent of Therapy: Curative Intent, Discussed with Patient

## 2022-02-10 NOTE — Progress Notes (Signed)
Thoracic Location of Tumor / Histology: Left Upper Lobe Lung, Left Hilar lymphadenopathy, Right Upper Lobe Lung- All Small Cell   PET 07/16/9469: Hypermetabolic LEFT upper lobe pulmonary nodule consistent with bronchogenic carcinoma.  ipsilateral LEFT hilar nodal metastasis.  No mediastinal nodal metastasis or supraclavicular nodal metastasis.  No distant metastatic disease.  Nodule in the RIGHT upper lobe does not have significant metabolic activity. Recommend attention on follow-up  MRI Brain 01/20/2022: No acute intracranial process.  No evidence of metastatic disease in the brain.  CTA Chest 01/06/2022: Pulmonary nodules in the lungs bilaterally, most concerning of which is a left upper lobe pulmonary nodule measuring 2.2 x 1.3 x 0.9 cm associated with left hilar lymphadenopathy. Findings are concerning for probable neoplasm.     Biopsies of RUL and LUL 02/02/2022     Tobacco/Marijuana/Snuff/ETOH use: Current Smoker, more or less 5 cigarettes per day.  Past/Anticipated interventions by cardiothoracic surgery, if any:   Past/Anticipated interventions by medical oncology, if any:  Dr. Julien Nordmann 02/09/2022 -I recommended for the patient a course of systemic chemotherapy with cisplatin 80 Mg/M2 on day 1 and etoposide 100 Mg/M2 on days 1, 2 and 3 every 3 weeks for 4 cycles.  This will be concurrent with radiotherapy. -I will refer the patient to radiation oncology for discussion of the radiotherapy option. -I will also arrange for the patient to have a Port-A-Cath placed by interventional radiology. -He is expected to start the first cycle of this treatment on February 17, 2022.   Signs/Symptoms Weight changes, if any: Has lost about 20 pounds in the last 6 months.  Supplementing with Boost shakes daily. Respiratory complaints, if any: SOB and labored breathing with exertion.   Resting often.  Using Albuterol inhaler. Hemoptysis, if any: Mild cough, he states he has some trouble getting the  mucus out.  Clear in color.  Denies hemoptysis. Pain issues, if any:  No  SAFETY ISSUES: Prior radiation? No Pacemaker/ICD? No  Possible current pregnancy? N/a Is the patient on methotrexate? No  Current Complaints / other details:   Port insertion

## 2022-02-11 ENCOUNTER — Other Ambulatory Visit: Payer: Self-pay

## 2022-02-11 ENCOUNTER — Ambulatory Visit
Admission: RE | Admit: 2022-02-11 | Discharge: 2022-02-11 | Disposition: A | Discharge status: Home / Self Care | Payer: Medicare Other | Source: Ambulatory Visit | Attending: Radiation Oncology | Admitting: Radiation Oncology

## 2022-02-11 ENCOUNTER — Encounter: Payer: Self-pay | Admitting: Radiation Oncology

## 2022-02-11 VITALS — Ht 71.0 in | Wt 109.0 lb

## 2022-02-11 DIAGNOSIS — C3412 Malignant neoplasm of upper lobe, left bronchus or lung: Secondary | ICD-10-CM

## 2022-02-11 DIAGNOSIS — C3411 Malignant neoplasm of upper lobe, right bronchus or lung: Secondary | ICD-10-CM

## 2022-02-11 NOTE — Progress Notes (Signed)
Radiation Oncology         [phone] ________________________________  Initial Outpatient Consultation - Conducted via telephone due to current COVID-19 concerns for limiting patient exposure  I spoke with the patient to conduct this consult visit via telephone to spare the patient unnecessary potential exposure in the healthcare setting during the current COVID-19 pandemic. The patient was notified in advance and was offered a [place] meeting to allow for face to face communication but unfortunately reported that they did not have the appropriate resources/technology to support such a visit and instead preferred to proceed with a telephone consult.   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 A diagnosis of Small cell carcinoma of upper lobe of right lung [facility]) was also pertinent to this visit.   HISTORY OF PRESENT ILLNESS: Mark Yu is a 66 y.o. male seen at the request of Dr. Julien Nordmann for a new diagnosis of small cell lung cancer.  The patient was recently found by CT in January 2023 to have pulmonary nodules in the lungs bilaterally with a dominant left upper lobe nodule measuring up to 2.2 cm with left hilar adenopathy.  Right upper lobe nodule was also noted measuring 9 mm in the right upper lobe.  He underwent a PET scan on 01/20/2022 that showed hypermetabolic changes in the left upper lobe and left hilar lymph node, SUVs ranged from 10.1-11.1.  He also had an SUV of 3.7 in the right upper lobe nodule, there was intense hypermetabolic activity with a 4 cm segment of proximal sigmoid colon with an SUV of 9.3.  The patient has never had a colonoscopy.  He underwent MRI of the brain on 01/20/2022 which was negative for metastatic disease.   Bronchoscopy on 02/02/2022 showed small cell carcinoma in the left upper lobe fine-needle aspirate and brushing and small cell carcinoma in the right upper lobe fine-needle aspirate and brushings.  Despite extensive stage disease, the patient does not have any bulky disease in the chest and is seen to consider more aggressive chemoradiation.  He met with Dr. Julien Nordmann and plans to start systemic chemotherapy on 02/17/2022, and is seen to discuss radiotherapy.    PREVIOUS RADIATION THERAPY: No   PAST MEDICAL HISTORY:  Past Medical History:  Diagnosis Date   Anxiety    tx xanax   COPD (chronic obstructive pulmonary disease) (HCC)    tx inhalers   Depression    Dyspnea    with exertion   HTN (hypertension)    tx amlodipine       PAST SURGICAL HISTORY: Past Surgical History:  Procedure Laterality Date   BRONCHIAL BIOPSY  02/02/2022   Procedure: BRONCHIAL BIOPSIES;  Surgeon: Collene Gobble, MD;  Location: [facility] ENDOSCOPY;  Service: Pulmonary;;   BRONCHIAL BRUSHINGS  02/02/2022   Procedure: BRONCHIAL BRUSHINGS;  Surgeon: Collene Gobble, MD;  Location: [facility] ENDOSCOPY;  Service: Pulmonary;;   BRONCHIAL NEEDLE ASPIRATION BIOPSY  02/02/2022   Procedure: BRONCHIAL NEEDLE ASPIRATION BIOPSIES;  Surgeon: Collene Gobble, MD;  Location: [facility] ENDOSCOPY;  Service: Pulmonary;;   MRI     NO PAST SURGERIES     VIDEO BRONCHOSCOPY WITH ENDOBRONCHIAL ULTRASOUND N/A 02/02/2022   Procedure: VIDEO BRONCHOSCOPY WITH ENDOBRONCHIAL ULTRASOUND;  Surgeon: Collene Gobble, MD;  Location: [facility] ENDOSCOPY;  Service: Pulmonary;  Laterality: N/A;   VIDEO BRONCHOSCOPY WITH RADIAL ENDOBRONCHIAL ULTRASOUND  02/02/2022   Procedure: VIDEO BRONCHOSCOPY WITH RADIAL ENDOBRONCHIAL ULTRASOUND;  Surgeon: Collene Gobble, MD;  Location: [facility] ENDOSCOPY;  Service: Pulmonary;;     FAMILY HISTORY:  Family History  Problem Relation Age of Onset   Alzheimer's disease Mother    Multiple sclerosis Father      SOCIAL HISTORY:  reports that he has  been smoking cigarettes. He has a 50.00 pack-year smoking history. He does not have any smokeless tobacco history on file. He reports current alcohol use of about 30.0 standard drinks per week. He reports current drug use. Drug: Marijuana.  The patient is single but in a relationship with his girlfriend of many years.  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  He lives in [place].   ALLERGIES: Amoxicillin   MEDICATIONS:  Current Outpatient Medications  Medication Sig Dispense Refill   acetaminophen (TYLENOL) 500 MG tablet Take 500 mg by mouth every 8 (eight) hours as needed for moderate pain or headache.     albuterol (VENTOLIN HFA) 108 (90 Base) MCG/ACT inhaler Inhale 2 puffs into the lungs every 4 (four) hours as needed for wheezing or shortness of breath.     ALPRAZolam (XANAX) 1 MG tablet Take 0.5-1 tablets (0.5-1 mg total) by mouth 2 (two) times daily as needed for anxiety.     amLODipine (NORVASC) 5 MG tablet Take 5 mg by mouth daily.     BREZTRI AEROSPHERE 160-9-4.8 MCG/ACT AERO Inhale 2 puffs into the lungs in the morning and at bedtime.     Calcium Carb-Cholecalciferol 500-10 MG-MCG TABS Take 1 tablet by mouth daily.     cholecalciferol (VITAMIN D) 25 MCG (1000 UNIT) tablet Take 1,000 Units by mouth daily.     fluticasone (FLONASE) 50 MCG/ACT nasal spray Place 2 sprays into both nostrils daily as needed for allergies or rhinitis.     folic acid (FOLVITE) 264 MCG tablet Take 400 mcg by mouth daily.     guaiFENesin (MUCINEX) 600 MG 12 hr tablet Take 600 mg by mouth every evening.     lidocaine-prilocaine (EMLA) cream Apply to the Port-A-Cath site 30-60 minutes before chemotherapy 30 g 0   magnesium oxide (MAG-OX) 400 MG tablet Take 400 mg by mouth daily.     methylPREDNISolone (MEDROL DOSEPAK) 4 MG TBPK tablet Use as instructed 21 tablet 0   prochlorperazine (COMPAZINE) 10 MG tablet Take 1 tablet (10 mg total) by mouth every 6 (six) hours as needed for nausea or vomiting.  30 tablet 0   vitamin B-12 (CYANOCOBALAMIN) 1000 MCG tablet Take 1,000 mcg by mouth daily.     No current facility-administered medications for this encounter.     REVIEW OF SYSTEMS: On review of systems, the patient reports he has recently been noticing orthopnea and shortness of breath with exertion. He has lost 20 pounds in the last 6 months unintentionally and is starting to supplement with daily boost shakes.  He has had a productive cough that is clear but sometimes has a hard time clearing his mucus.  He denies any symptoms of hemoptysis.  PHYSICAL EXAM:  Unable to assess given encounter type.  ECOG = 1  0 - Asymptomatic (Fully active, able to carry on all predisease activities without restriction)  1 - Symptomatic but completely ambulatory (Restricted in physically strenuous activity but ambulatory and able to carry  out work of a light or sedentary nature. For example, light housework, office work)  2 - Symptomatic, <50% in bed during the day (Ambulatory and capable of all self care but unable to carry out any work activities. Up and about more than 50% of waking hours)  3 - Symptomatic, >50% in bed, but not bedbound (Capable of only limited self-care, confined to bed or chair 50% or more of waking hours)  4 - Bedbound (Completely disabled. Cannot carry on any self-care. Totally confined to bed or chair)  5 - Death   Eustace Pen MM, Creech RH, Tormey DC, et al. [phone]). "Toxicity and response criteria of the [facility]". [place] Oncol. 5 (6): 649-55    LABORATORY DATA:  Lab Results  Component Value Date   WBC 5.4 02/09/2022   HGB 12.8 (L) 02/09/2022   HCT 38.3 (L) 02/09/2022   MCV 90.1 02/09/2022   PLT 262 02/09/2022   Lab Results  Component Value Date   NA 141 02/09/2022   K 4.0 02/09/2022   CL 103 02/09/2022   CO2 32 02/09/2022   Lab Results  Component Value Date   ALT 12 02/09/2022   AST 16 02/09/2022   ALKPHOS 55 02/09/2022   BILITOT  0.5 02/09/2022      RADIOGRAPHY: MR BRAIN W WO CONTRAST  Result Date: 01/21/2022 CLINICAL DATA:  Non-small cell lung cancer, staging EXAM: MRI HEAD WITHOUT AND WITH CONTRAST TECHNIQUE: Multiplanar, multiecho pulse sequences of the brain and surrounding structures were obtained without and with intravenous contrast. CONTRAST:  52m GADAVIST GADOBUTROL 1 MMOL/ML IV SOLN COMPARISON:  None. FINDINGS: Brain: No abnormal enhancement or cerebral edema. No restricted diffusion to suggest acute or subacute infarct. No acute hemorrhage, mass mass effect, or midline shift. No hydrocephalus or extra-axial collection. T2 hyperintense signal in the periventricular white matter and pons, likely the sequela of chronic small vessel ischemic disease. Remote lacunar infarcts in the bilateral lentiform nuclei/external capsules and left caudate head, which are associated with areas of hemosiderin deposition, likely perilesional hemorrhage. Focus of hemosiderin deposition in the posterior lentiform nucleus, likely sequela of prior hypertensive microhemorrhage. Vascular: Normal flow voids. Skull and upper cervical spine: Normal marrow signal. Sinuses/Orbits: Negative. Other: The mastoids are well aerated. IMPRESSION: No acute intracranial process. No evidence of metastatic disease in the brain. Electronically Signed   By: AMerilyn BabaM.D.   On: 01/21/2022 23:17   NM PET Image Initial (PI) Skull Base To Thigh (F-18 FDG)  Result Date: 01/21/2022 CLINICAL DATA:  Initial treatment strategy for suspicious pulmonary nodule. EXAM: NUCLEAR MEDICINE PET SKULL BASE TO THIGH TECHNIQUE: 6.4 mCi F-18 FDG was injected intravenously. Full-ring PET imaging was performed from the skull base to thigh after the radiotracer. CT data was obtained and used for attenuation correction and anatomic localization. Fasting blood glucose: 84 mg/dl COMPARISON:  CT 01/06/2022 FINDINGS: Mediastinal blood pool activity: SUV max 1.38 Liver activity: SUV max NA  NECK: No hypermetabolic lymph nodes in the neck. Incidental CT findings: none CHEST: Elongated lobular nodule in the LEFT upper lobe measures 1.7 x 1.1 cm (image 87/series 4) and has intense metabolic activity SUV max equal 10.1. this is the nodule of concern comparison CT. Enlarged intensely hypermetabolic LEFT hilar lymph node measures 15 mm with SUV max equal 11.1. No hypermetabolic mediastinal nodes or supraclavicular nodes Rounded nodule in the RIGHT upper lobe measures 9 mm with faint metabolic activity (SUV max equal 0.7.). Incidental CT findings: none ABDOMEN/PELVIS: No hypermetabolic  activity the adrenal glands. No abnormal activity in liver. No abdominopelvic lymph nodes There is intense metabolic activity associated with a 4 cm segment of proximal sigmoid colon with SUV max equal 9.3 (image 177). There is bowel wall thickening through this region without clear mass lesion. There multiple diverticula through this region. No bowel obstruction. Incidental CT findings: none SKELETON: No focal activity to suggest skeletal metastasis. No lytic or sclerotic lesions. Incidental CT findings: none IMPRESSION: 1. 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 2. ipsilateral LEFT hilar nodal metastasis. 3. No mediastinal nodal metastasis or supraclavicular nodal metastasis. 4. No distant metastatic disease 5. Nodule in the RIGHT upper lobe does not have significant metabolic activity. Recommend attention on follow-up. 6. Centrilobular emphysema in the upper lobes. 7. A segment of intense metabolic activity associated the proximal sigmoid colon. 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 Recommend colonoscopy if patient is not current for screening. Electronically Signed   By: Suzy Bouchard M.D.   On: 01/21/2022 14:47   DG Chest Port 1 View  Result Date: 02/02/2022 CLINICAL DATA:  Status post bronchoscopy. EXAM: PORTABLE CHEST 1 VIEW  COMPARISON:  January 06, 2022. FINDINGS: The heart size and mediastinal contours are within normal limits. Hyperexpansion of the lungs is noted. No pneumothorax or pleural effusion is noted. Bilateral pulmonary nodules are again noted. The visualized skeletal structures are unremarkable. IMPRESSION: No pneumothorax is noted. Hyperexpansion of the lungs is noted. Bilateral pulmonary nodules are again noted. Electronically Signed   By: Marijo Conception M.D.   On: 02/02/2022 13:35   DG C-Arm 1-60 Min-No Report  Result Date: 02/02/2022 Fluoroscopy was utilized by the requesting physician.  No radiographic interpretation.   DG C-ARM BRONCHOSCOPY  Result Date: 02/02/2022 C-ARM BRONCHOSCOPY: Fluoroscopy was utilized by the requesting physician.  No radiographic interpretation.       IMPRESSION/PLAN: 1. 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  Dr. Lisbeth Renshaw discusses the findings from his PET scan and work-up thus far.  While he has extensive stage disease, Dr. Lisbeth Renshaw and Dr. Julien Nordmann are both in favor of being aggressive locally for him given the fact that his disease is not bulky.  Dr. Lisbeth Renshaw offers a course of conventional radiotherapy along with his plans for chemosensitization for 6-1/2 weeks to include both lung targets and the regional lymph nodes of the mediastinum.  We discussed the risks, benefits, short and long-term effects of radiotherapy, and the patient is interested in proceeding.  We will coordinate with our simulation department for planning of his therapy as the start date of his treatment is closely approaching.  The patient is in agreement and will sign written consent to proceed when he comes for simulation. 2.    PCI.  I discussed with the patient today the role for prophylactic cranial irradiation.  This would be at the conclusion of systemic chemotherapy.  We briefly discussed the rationale for this and the logistics of the  style of treatment.  The patient is in agreement and we will revisit this at the appropriate time. 3. PET positive finding in the sigmoid colon.  The patient reports he has never had a colonoscopy.  I encouraged him to consider meeting with gastroenterology.  At this time he desires to hold off on this at this time so he can pursue treatment for #1.   Given current concerns for patient exposure during the COVID-19 pandemic, this encounter  was conducted via telephone.  The patient has provided two factor identification and has given verbal consent for this type of encounter and has been advised to only accept a meeting of this type in a secure network environment. The time spent during this encounter was 60 minutes including preparation, discussion, and coordination of the patient's care. The attendants for this meeting include Blenda Nicely, RN, Dr. Lisbeth Renshaw, Hayden Pedro  and Faustino Congress.  During the encounter,  Blenda Nicely, RN, Dr. Lisbeth Renshaw, and Hayden Pedro were located at [facility] Radiation Oncology Department.  Mark Yu was located at home.      Carola Rhine, [facility]   **Disclaimer: This note was dictated with voice recognition software. Similar sounding words can inadvertently be transcribed and this note may contain transcription errors which may not have been corrected upon publication of note.**

## 2022-02-12 ENCOUNTER — Other Ambulatory Visit: Payer: Self-pay

## 2022-02-12 ENCOUNTER — Ambulatory Visit
Admission: RE | Admit: 2022-02-12 | Discharge: 2022-02-12 | Disposition: A | Discharge status: Home / Self Care | Payer: Medicare Other | Source: Ambulatory Visit | Attending: Radiation Oncology | Admitting: Radiation Oncology

## 2022-02-12 DIAGNOSIS — Z510 Encounter for antineoplastic radiation therapy: Secondary | ICD-10-CM | POA: Insufficient documentation

## 2022-02-12 DIAGNOSIS — C3412 Malignant neoplasm of upper lobe, left bronchus or lung: Secondary | ICD-10-CM | POA: Diagnosis present

## 2022-02-13 ENCOUNTER — Other Ambulatory Visit: Payer: Self-pay

## 2022-02-13 DIAGNOSIS — C3412 Malignant neoplasm of upper lobe, left bronchus or lung: Secondary | ICD-10-CM

## 2022-02-15 DIAGNOSIS — Z510 Encounter for antineoplastic radiation therapy: Secondary | ICD-10-CM | POA: Diagnosis not present

## 2022-02-16 ENCOUNTER — Other Ambulatory Visit: Payer: Self-pay

## 2022-02-16 ENCOUNTER — Other Ambulatory Visit: Payer: Self-pay | Admitting: Physician Assistant

## 2022-02-16 ENCOUNTER — Inpatient Hospital Stay: Payer: Medicare Other

## 2022-02-16 DIAGNOSIS — C3412 Malignant neoplasm of upper lobe, left bronchus or lung: Secondary | ICD-10-CM

## 2022-02-16 DIAGNOSIS — Z5111 Encounter for antineoplastic chemotherapy: Secondary | ICD-10-CM | POA: Diagnosis not present

## 2022-02-16 LAB — CBC WITH DIFFERENTIAL (CANCER CENTER ONLY)
Abs Immature Granulocytes: 0.05 10*3/uL (ref 0.00–0.07)
Basophils Absolute: 0 10*3/uL (ref 0.0–0.1)
Basophils Relative: 0 %
Eosinophils Absolute: 0 10*3/uL (ref 0.0–0.5)
Eosinophils Relative: 0 %
HCT: 37.6 % — ABNORMAL LOW (ref 39.0–52.0)
Hemoglobin: 12.4 g/dL — ABNORMAL LOW (ref 13.0–17.0)
Immature Granulocytes: 1 %
Lymphocytes Relative: 28 %
Lymphs Abs: 2.5 10*3/uL (ref 0.7–4.0)
MCH: 29.7 pg (ref 26.0–34.0)
MCHC: 33 g/dL (ref 30.0–36.0)
MCV: 90 fL (ref 80.0–100.0)
Monocytes Absolute: 1 10*3/uL (ref 0.1–1.0)
Monocytes Relative: 11 %
Neutro Abs: 5.3 10*3/uL (ref 1.7–7.7)
Neutrophils Relative %: 60 %
Platelet Count: 262 10*3/uL (ref 150–400)
RBC: 4.18 MIL/uL — ABNORMAL LOW (ref 4.22–5.81)
RDW: 19.1 % — ABNORMAL HIGH (ref 11.5–15.5)
WBC Count: 8.9 10*3/uL (ref 4.0–10.5)
nRBC: 0 % (ref 0.0–0.2)

## 2022-02-16 LAB — CMP (CANCER CENTER ONLY)
ALT: 11 U/L (ref 0–44)
AST: 15 U/L (ref 15–41)
Albumin: 4.3 g/dL (ref 3.5–5.0)
Alkaline Phosphatase: 60 U/L (ref 38–126)
Anion gap: 8 (ref 5–15)
BUN: 18 mg/dL (ref 8–23)
CO2: 30 mmol/L (ref 22–32)
Calcium: 9.6 mg/dL (ref 8.9–10.3)
Chloride: 102 mmol/L (ref 98–111)
Creatinine: 0.93 mg/dL (ref 0.61–1.24)
GFR, Estimated: >60 mL/min (ref >60)
Glucose, Bld: 102 mg/dL — ABNORMAL HIGH (ref 70–99)
Potassium: 3.7 mmol/L (ref 3.5–5.1)
Sodium: 140 mmol/L (ref 135–145)
Total Bilirubin: 0.3 mg/dL (ref 0.3–1.2)
Total Protein: 7.2 g/dL (ref 6.5–8.1)

## 2022-02-16 MED FILL — Dexamethasone Sodium Phosphate Inj 100 MG/10ML: INTRAMUSCULAR | Qty: 1 | Status: AC

## 2022-02-16 MED FILL — Fosaprepitant Dimeglumine For IV Infusion 150 MG (Base Eq): INTRAVENOUS | Qty: 5 | Status: AC

## 2022-02-16 NOTE — Progress Notes (Signed)
I was asked to see the patient in the lobby. He was reporting shortness of breath and anxiety. He stated being in the clinic reminded him he has cancer. He is scheduled for patient education today and is expected to start his first treatment tomorrow. He was placed on oxygen. By the time I evaluated him, he was feeling better. He took 0.5 mg of xanax prior to his appointment today. He denies chest pain or leg swelling. He reports baseline dyspnea on exertion and recovers with resting. He states he is interested in getting a nebulizer at home. For his COPD, he is prescribed breztri and albuterol. He was supposed to see pulmonology tomorrow but is unable to make it due to conflict with his first chemotherapy. This has been rescheduled for 03/05/22. Discussed we will reach out to them to see if his visit can be moved up. (Update, we have rescheduled his appointment for 02/20/22). Discussed it is ok to bring his xanax to the clinic tomorrow if needed for anxiety. Offered to give him another 0.5 mg while in the clinic today. He declined. All questions answered.

## 2022-02-17 ENCOUNTER — Ambulatory Visit: Payer: Medicare Other | Admitting: Internal Medicine

## 2022-02-17 ENCOUNTER — Ambulatory Visit
Admission: RE | Admit: 2022-02-17 | Discharge: 2022-02-17 | Disposition: A | Discharge status: Home / Self Care | Payer: Medicare Other | Source: Ambulatory Visit | Attending: Radiation Oncology | Admitting: Radiation Oncology

## 2022-02-17 ENCOUNTER — Inpatient Hospital Stay: Payer: Medicare Other

## 2022-02-17 VITALS — BP 142/85 | HR 83 | Temp 98.2°F | Resp 18 | Wt 108.5 lb

## 2022-02-17 DIAGNOSIS — C3412 Malignant neoplasm of upper lobe, left bronchus or lung: Secondary | ICD-10-CM

## 2022-02-17 DIAGNOSIS — Z510 Encounter for antineoplastic radiation therapy: Secondary | ICD-10-CM | POA: Diagnosis not present

## 2022-02-17 DIAGNOSIS — Z5111 Encounter for antineoplastic chemotherapy: Secondary | ICD-10-CM | POA: Diagnosis not present

## 2022-02-17 MED ORDER — POTASSIUM CHLORIDE IN NACL 20-0.9 MEQ/L-% IV SOLN
Freq: Once | INTRAVENOUS | Status: AC
  Filled 2022-02-17: qty 1000

## 2022-02-17 MED ORDER — SODIUM CHLORIDE 0.9 % IV SOLN
100.0000 mg/m2 | Freq: Once | INTRAVENOUS | Status: AC
  Administered 2022-02-17: 160 mg via INTRAVENOUS
  Filled 2022-02-17: qty 8

## 2022-02-17 MED ORDER — SODIUM CHLORIDE 0.9 % IV SOLN
80.0000 mg/m2 | Freq: Once | INTRAVENOUS | Status: AC
  Administered 2022-02-17: 126 mg via INTRAVENOUS
  Filled 2022-02-17: qty 126

## 2022-02-17 MED ORDER — MAGNESIUM SULFATE 2 GM/50ML IV SOLN
2.0000 g | Freq: Once | INTRAVENOUS | Status: AC
  Administered 2022-02-17: 2 g via INTRAVENOUS
  Filled 2022-02-17: qty 50

## 2022-02-17 MED ORDER — PALONOSETRON HCL INJECTION 0.25 MG/5ML
0.2500 mg | Freq: Once | INTRAVENOUS | Status: AC
  Administered 2022-02-17: 0.25 mg via INTRAVENOUS
  Filled 2022-02-17: qty 5

## 2022-02-17 MED ORDER — SODIUM CHLORIDE 0.9 % IV SOLN
10.0000 mg | Freq: Once | INTRAVENOUS | Status: AC
  Administered 2022-02-17: 10 mg via INTRAVENOUS
  Filled 2022-02-17: qty 10

## 2022-02-17 MED ORDER — SODIUM CHLORIDE 0.9 % IV SOLN
150.0000 mg | Freq: Once | INTRAVENOUS | Status: AC
  Administered 2022-02-17: 150 mg via INTRAVENOUS
  Filled 2022-02-17: qty 150

## 2022-02-17 MED ORDER — SODIUM CHLORIDE 0.9 % IV SOLN: Freq: Once | INTRAVENOUS | Status: AC

## 2022-02-17 MED FILL — Dexamethasone Sodium Phosphate Inj 100 MG/10ML: INTRAMUSCULAR | Qty: 1 | Status: AC

## 2022-02-17 NOTE — Patient Instructions (Signed)
[facility]  Discharge Instructions: Thank you for choosing [facility] to provide your oncology and hematology care.   If you have a lab appointment with the [facility], please go directly to the [facility] and check in at the registration area.   Wear comfortable clothing and clothing appropriate for easy access to any Portacath or PICC line.   We strive to give you quality time with your provider. You may need to reschedule your appointment if you arrive late (15 or more minutes).  Arriving late affects you and other patients whose appointments are after yours.  Also, if you miss three or more appointments without notifying the office, you may be dismissed from the clinic at the providers discretion.      For prescription refill requests, have your pharmacy contact our office and allow 72 hours for refills to be completed.    Today you received the following chemotherapy and/or immunotherapy agents: cisplatin, etoposide      To help prevent nausea and vomiting after your treatment, we encourage you to take your nausea medication as directed.  BELOW ARE SYMPTOMS THAT SHOULD BE REPORTED IMMEDIATELY: *FEVER GREATER THAN 100.4 F (38 C) OR HIGHER *CHILLS OR SWEATING *NAUSEA AND VOMITING THAT IS NOT CONTROLLED WITH YOUR NAUSEA MEDICATION *UNUSUAL SHORTNESS OF BREATH *UNUSUAL BRUISING OR BLEEDING *URINARY PROBLEMS (pain or burning when urinating, or frequent urination) *BOWEL PROBLEMS (unusual diarrhea, constipation, pain near the anus) TENDERNESS IN MOUTH AND THROAT WITH OR WITHOUT PRESENCE OF ULCERS (sore throat, sores in mouth, or a toothache) UNUSUAL RASH, SWELLING OR PAIN  UNUSUAL VAGINAL DISCHARGE OR ITCHING   Items with * indicate a potential emergency and should be followed up as soon as possible or go to the Emergency Department if any problems should occur.  Please show the CHEMOTHERAPY ALERT CARD or IMMUNOTHERAPY ALERT CARD at  check-in to the Emergency Department and triage nurse.  Should you have questions after your visit or need to cancel or reschedule your appointment, please contact [facility]  Dept: [phone]  and follow the prompts.  Office hours are 8:00 a.m. to 4:30 p.m. Monday - Friday. Please note that voicemails left after 4:00 p.m. may not be returned until the following business day.  We are closed weekends and major holidays. You have access to a nurse at all times for urgent questions. Please call the main number to the clinic Dept: [phone] and follow the prompts.   For any non-urgent questions, you may also contact your provider using MyChart. We now offer e-Visits for anyone 71 and older to request care online for non-urgent symptoms. For details visit [email or web address].   Also download the MyChart app! Go to the app store, search "MyChart", open the app, select [facility], and log in with your MyChart username and password.  Due to Covid, a mask is required upon entering the hospital/clinic. If you do not have a mask, one will be given to you upon arrival. For doctor visits, patients may have 1 support person aged 47 or older with them. For treatment visits, patients cannot have anyone with them due to current Covid guidelines and our immunocompromised population.   Cisplatin injection What is this medication? CISPLATIN (SIS pla tin) is a chemotherapy drug. It targets fast dividing cells, like cancer cells, and causes these cells to die. This medicine is used to treat many types of cancer like bladder, ovarian, and testicular cancers. This medicine may be  used for other purposes; ask your health care provider or pharmacist if you have questions. COMMON BRAND NAME(S): Platinol, Platinol -AQ What should I tell my care team before I take this medication? They need to know if you have any of these conditions: eye disease, vision problems hearing  problems kidney disease low blood counts, like white cells, platelets, or red blood cells tingling of the fingers or toes, or other nerve disorder an unusual or allergic reaction to cisplatin, carboplatin, oxaliplatin, other medicines, foods, dyes, or preservatives pregnant or trying to get pregnant breast-feeding How should I use this medication? This drug is given as an infusion into a vein. It is administered in a hospital or clinic by a specially trained health care professional. Talk to your pediatrician regarding the use of this medicine in children. Special care may be needed. Overdosage: If you think you have taken too much of this medicine contact a poison control center or emergency room at once. NOTE: This medicine is only for you. Do not share this medicine with others. What if I miss a dose? It is important not to miss a dose. Call your doctor or health care professional if you are unable to keep an appointment. What may interact with this medication? This medicine may interact with the following medications: foscarnet certain antibiotics like amikacin, gentamicin, neomycin, polymyxin B, streptomycin, tobramycin, vancomycin This list may not describe all possible interactions. Give your health care provider a list of all the medicines, herbs, non-prescription drugs, or dietary supplements you use. Also tell them if you smoke, drink alcohol, or use illegal drugs. Some items may interact with your medicine. What should I watch for while using this medication? Your condition will be monitored carefully while you are receiving this medicine. You will need important blood work done while you are taking this medicine. This drug may make you feel generally unwell. This is not uncommon, as chemotherapy can affect healthy cells as well as cancer cells. Report any side effects. Continue your course of treatment even though you feel ill unless your doctor tells you to stop. This medicine may  increase your risk of getting an infection. Call your healthcare professional for advice if you get a fever, chills, or sore throat, or other symptoms of a cold or flu. Do not treat yourself. Try to avoid being around people who are sick. Avoid taking medicines that contain aspirin, acetaminophen, ibuprofen, naproxen, or ketoprofen unless instructed by your healthcare professional. These medicines may hide a fever. This medicine may increase your risk to bruise or bleed. Call your doctor or health care professional if you notice any unusual bleeding. Be careful brushing and flossing your teeth or using a toothpick because you may get an infection or bleed more easily. If you have any dental work done, tell your dentist you are receiving this medicine. Do not become pregnant while taking this medicine or for 14 months after stopping it. Women should inform their healthcare professional if they wish to become pregnant or think they might be pregnant. Men should not father a child while taking this medicine and for 11 months after stopping it. There is potential for serious side effects to an unborn child. Talk to your healthcare professional for more information. Do not breast-feed an infant while taking this medicine. This medicine has caused ovarian failure in some women. This medicine may make it more difficult to get pregnant. Talk to your healthcare professional if you are concerned about your fertility. This medicine has  caused decreased sperm counts in some men. This may make it more difficult to father a child. Talk to your healthcare professional if you are concerned about your fertility. Drink fluids as directed while you are taking this medicine. This will help protect your kidneys. Call your doctor or health care professional if you get diarrhea. Do not treat yourself. What side effects may I notice from receiving this medication? Side effects that you should report to your doctor or health care  professional as soon as possible: allergic reactions like skin rash, itching or hives, swelling of the face, lips, or tongue blurred vision changes in vision decreased hearing or ringing of the ears nausea, vomiting pain, redness, or irritation at site where injected pain, tingling, numbness in the hands or feet signs and symptoms of bleeding such as bloody or black, tarry stools; red or dark brown urine; spitting up blood or brown material that looks like coffee grounds; red spots on the skin; unusual bruising or bleeding from the eyes, gums, or nose signs and symptoms of infection like fever; chills; cough; sore throat; pain or trouble passing urine signs and symptoms of kidney injury like trouble passing urine or change in the amount of urine signs and symptoms of low red blood cells or anemia such as unusually weak or tired; feeling faint or lightheaded; falls; breathing problems Side effects that usually do not require medical attention (report to your doctor or health care professional if they continue or are bothersome): loss of appetite mouth sores muscle cramps This list may not describe all possible side effects. Call your doctor for medical advice about side effects. You may report side effects to FDA at 1-800-FDA-1088. Where should I keep my medication? This drug is given in a hospital or clinic and will not be stored at home. NOTE: This sheet is a summary. It may not cover all possible information. If you have questions about this medicine, talk to your doctor, pharmacist, or health care provider.  2022 Elsevier/Gold Standard (2021-09-02 00:00:00)  Etoposide, VP-16 injection What is this medication? ETOPOSIDE, VP-16 (e toe POE side) is a chemotherapy drug. It is used to treat testicular cancer, lung cancer, and other cancers. This medicine may be used for other purposes; ask your health care provider or pharmacist if you have questions. COMMON BRAND NAME(S): Etopophos,  Toposar, VePesid What should I tell my care team before I take this medication? They need to know if you have any of these conditions: infection kidney disease liver disease low blood counts, like low white cell, platelet, or red cell counts an unusual or allergic reaction to etoposide, other medicines, foods, dyes, or preservatives pregnant or trying to get pregnant breast-feeding How should I use this medication? This medicine is for infusion into a vein. It is administered in a hospital or clinic by a specially trained health care professional. Talk to your pediatrician regarding the use of this medicine in children. Special care may be needed. Overdosage: If you think you have taken too much of this medicine contact a poison control center or emergency room at once. NOTE: This medicine is only for you. Do not share this medicine with others. What if I miss a dose? It is important not to miss your dose. Call your doctor or health care professional if you are unable to keep an appointment. What may interact with this medication? This medicine may interact with the following medications: warfarin This list may not describe all possible interactions. Give your health care  provider a list of all the medicines, herbs, non-prescription drugs, or dietary supplements you use. Also tell them if you smoke, drink alcohol, or use illegal drugs. Some items may interact with your medicine. What should I watch for while using this medication? Visit your doctor for checks on your progress. This drug may make you feel generally unwell. This is not uncommon, as chemotherapy can affect healthy cells as well as cancer cells. Report any side effects. Continue your course of treatment even though you feel ill unless your doctor tells you to stop. In some cases, you may be given additional medicines to help with side effects. Follow all directions for their use. Call your doctor or health care professional for  advice if you get a fever, chills or sore throat, or other symptoms of a cold or flu. Do not treat yourself. This drug decreases your body's ability to fight infections. Try to avoid being around people who are sick. This medicine may increase your risk to bruise or bleed. Call your doctor or health care professional if you notice any unusual bleeding. Talk to your doctor about your risk of cancer. You may be more at risk for certain types of cancers if you take this medicine. Do not become pregnant while taking this medicine or for at least 6 months after stopping it. Women should inform their doctor if they wish to become pregnant or think they might be pregnant. Women of child-bearing potential will need to have a negative pregnancy test before starting this medicine. There is a potential for serious side effects to an unborn child. Talk to your health care professional or pharmacist for more information. Do not breast-feed an infant while taking this medicine. Men must use a latex condom during sexual contact with a woman while taking this medicine and for at least 4 months after stopping it. A latex condom is needed even if you have had a vasectomy. Contact your doctor right away if your partner becomes pregnant. Do not donate sperm while taking this medicine and for at least 4 months after you stop taking this medicine. Men should inform their doctors if they wish to father a child. This medicine may lower sperm counts. What side effects may I notice from receiving this medication? Side effects that you should report to your doctor or health care professional as soon as possible: allergic reactions like skin rash, itching or hives, swelling of the face, lips, or tongue low blood counts - this medicine may decrease the number of white blood cells, red blood cells, and platelets. You may be at increased risk for infections and bleeding nausea, vomiting redness, blistering, peeling or loosening of the  skin, including inside the mouth signs and symptoms of infection like fever; chills; cough; sore throat; pain or trouble passing urine signs and symptoms of low red blood cells or anemia such as unusually weak or tired; feeling faint or lightheaded; falls; breathing problems unusual bruising or bleeding Side effects that usually do not require medical attention (report to your doctor or health care professional if they continue or are bothersome): changes in taste diarrhea hair loss loss of appetite mouth sores This list may not describe all possible side effects. Call your doctor for medical advice about side effects. You may report side effects to FDA at 1-800-FDA-1088. Where should I keep my medication? This drug is given in a hospital or clinic and will not be stored at home. NOTE: This sheet is a summary. It may not  cover all possible information. If you have questions about this medicine, talk to your doctor, pharmacist, or health care provider.  2022 Elsevier/Gold Standard (2021-09-02 00:00:00)

## 2022-02-18 ENCOUNTER — Inpatient Hospital Stay: Payer: Medicare Other

## 2022-02-18 ENCOUNTER — Other Ambulatory Visit: Payer: Self-pay

## 2022-02-18 ENCOUNTER — Ambulatory Visit
Admission: RE | Admit: 2022-02-18 | Discharge: 2022-02-18 | Disposition: A | Discharge status: Home / Self Care | Payer: Medicare Other | Source: Ambulatory Visit | Attending: Radiation Oncology | Admitting: Radiation Oncology

## 2022-02-18 VITALS — BP 145/90 | HR 90 | Temp 98.6°F | Resp 18

## 2022-02-18 DIAGNOSIS — Z510 Encounter for antineoplastic radiation therapy: Secondary | ICD-10-CM | POA: Diagnosis not present

## 2022-02-18 DIAGNOSIS — C3412 Malignant neoplasm of upper lobe, left bronchus or lung: Secondary | ICD-10-CM

## 2022-02-18 DIAGNOSIS — Z5111 Encounter for antineoplastic chemotherapy: Secondary | ICD-10-CM | POA: Diagnosis not present

## 2022-02-18 MED ORDER — SODIUM CHLORIDE 0.9 % IV SOLN
100.0000 mg/m2 | Freq: Once | INTRAVENOUS | Status: AC
  Administered 2022-02-18: 160 mg via INTRAVENOUS
  Filled 2022-02-18: qty 8

## 2022-02-18 MED ORDER — SODIUM CHLORIDE 0.9 % IV SOLN: Freq: Once | INTRAVENOUS | Status: AC

## 2022-02-18 MED ORDER — SODIUM CHLORIDE 0.9 % IV SOLN
10.0000 mg | Freq: Once | INTRAVENOUS | Status: AC
  Administered 2022-02-18: 10 mg via INTRAVENOUS
  Filled 2022-02-18: qty 10

## 2022-02-18 MED FILL — Dexamethasone Sodium Phosphate Inj 100 MG/10ML: INTRAMUSCULAR | Qty: 1 | Status: AC

## 2022-02-18 NOTE — Patient Instructions (Signed)
[facility]  Discharge Instructions: Thank you for choosing [facility] to provide your oncology and hematology care.   If you have a lab appointment with the Cancer Center, please go directly to the Cancer Center and check in at the registration area.   Wear comfortable clothing and clothing appropriate for easy access to any Portacath or PICC line.   We strive to give you quality time with your provider. You may need to reschedule your appointment if you arrive late (15 or more minutes).  Arriving late affects you and other patients whose appointments are after yours.  Also, if you miss three or more appointments without notifying the office, you may be dismissed from the clinic at the provider's discretion.      For prescription refill requests, have your pharmacy contact our office and allow 72 hours for refills to be completed.    Today you received the following chemotherapy and/or immunotherapy agents: etoposide.     To help prevent nausea and vomiting after your treatment, we encourage you to take your nausea medication as directed.  BELOW ARE SYMPTOMS THAT SHOULD BE REPORTED IMMEDIATELY: . *FEVER GREATER THAN 100.4 F (38 C) OR HIGHER . *CHILLS OR SWEATING . *NAUSEA AND VOMITING THAT IS NOT CONTROLLED WITH YOUR NAUSEA MEDICATION . *UNUSUAL SHORTNESS OF BREATH . *UNUSUAL BRUISING OR BLEEDING . *URINARY PROBLEMS (pain or burning when urinating, or frequent urination) . *BOWEL PROBLEMS (unusual diarrhea, constipation, pain near the anus) . TENDERNESS IN MOUTH AND THROAT WITH OR WITHOUT PRESENCE OF ULCERS (sore throat, sores in mouth, or a toothache) . UNUSUAL RASH, SWELLING OR PAIN  . UNUSUAL VAGINAL DISCHARGE OR ITCHING   Items with * indicate a potential emergency and should be followed up as soon as possible or go to the Emergency Department if any problems should occur.  Please show the CHEMOTHERAPY ALERT CARD or IMMUNOTHERAPY ALERT  CARD at check-in to the Emergency Department and triage nurse.  Should you have questions after your visit or need to cancel or reschedule your appointment, please contact [facility]  Dept: [phone]  and follow the prompts.  Office hours are 8:00 a.m. to 4:30 p.m. Monday - Friday. Please note that voicemails left after 4:00 p.m. may not be returned until the following business day.  We are closed weekends and major holidays. You have access to a nurse at all times for urgent questions. Please call the main number to the clinic Dept: [phone] and follow the prompts.   For any non-urgent questions, you may also contact your provider using MyChart. We now offer e-Visits for anyone 18 and older to request care online for non-urgent symptoms. For details visit [email or web address].   Also download the MyChart app! Go to the app store, search "MyChart", open the app, select , and log in with your MyChart username and password.  Due to Covid, a mask is required upon entering the hospital/clinic. If you do not have a mask, one will be given to you upon arrival. For doctor visits, patients may have 1 support person aged 18 or older with them. For treatment visits, patients cannot have anyone with them due to current Covid guidelines and our immunocompromised population.   

## 2022-02-19 ENCOUNTER — Inpatient Hospital Stay: Payer: Medicare Other | Admitting: Dietician

## 2022-02-19 ENCOUNTER — Ambulatory Visit
Admission: RE | Admit: 2022-02-19 | Discharge: 2022-02-19 | Disposition: A | Discharge status: Home / Self Care | Payer: Medicare Other | Source: Ambulatory Visit | Attending: Radiation Oncology | Admitting: Radiation Oncology

## 2022-02-19 ENCOUNTER — Inpatient Hospital Stay: Payer: Medicare Other

## 2022-02-19 VITALS — BP 134/87 | HR 92 | Temp 98.0°F | Resp 18

## 2022-02-19 DIAGNOSIS — Z510 Encounter for antineoplastic radiation therapy: Secondary | ICD-10-CM | POA: Diagnosis not present

## 2022-02-19 DIAGNOSIS — Z5111 Encounter for antineoplastic chemotherapy: Secondary | ICD-10-CM | POA: Diagnosis not present

## 2022-02-19 DIAGNOSIS — C3412 Malignant neoplasm of upper lobe, left bronchus or lung: Secondary | ICD-10-CM

## 2022-02-19 MED ORDER — SODIUM CHLORIDE 0.9 % IV SOLN
100.0000 mg/m2 | Freq: Once | INTRAVENOUS | Status: AC
  Administered 2022-02-19: 160 mg via INTRAVENOUS
  Filled 2022-02-19: qty 8

## 2022-02-19 MED ORDER — SODIUM CHLORIDE 0.9 % IV SOLN
10.0000 mg | Freq: Once | INTRAVENOUS | Status: AC
  Administered 2022-02-19: 10 mg via INTRAVENOUS
  Filled 2022-02-19: qty 10

## 2022-02-19 MED ORDER — SODIUM CHLORIDE 0.9 % IV SOLN: Freq: Once | INTRAVENOUS | Status: AC

## 2022-02-19 NOTE — Progress Notes (Signed)
Nutrition Assessment  Reason for Assessment: MST (+wt loss)   ASSESSMENT: 66 year old male with newly diagnosed small cell lung cancer, likely extensive stage. He is receiving concurrent chemoradiation with weekly cisplatin + etoposide. Patient is under the care of Dr. Julien Nordmann.   Past medical history includes COPD, HTN, anxiety.   Met with patient during infusion. He reports nervous energy today with first treatment despite taking xanax. 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 Patient is no longer drinking alcohol and working to quit smoking cigarettes. Patient reports new diagnosis of COPD last year. He is chronically short of breath. Typically inhalers work well for him. Patient does not use supplemental oxygen at this time. He has a follow-up with pulmonologist soon and plans to discuss this with him as he feels worsening baseline dyspnea. Patient reports his girlfriend is a [employer] officer a variety of foods. Patient typically eats 1-2 meals/day. Patient has never cared much for breakfast. He drinks coffee on his way to work at 4 AM. Patient states he "might get in 1500 calories on a good day." Patient has been drinking 1-2 Boost Original. He denies nausea, vomiting, diarrhea, constipation.   Nutrition Focused Physical Exam Severe fat depletion - orbital, buccal Severe muscle depletion - temple, clavicle, clavicle and acromion, dorsal hand  Labs: 2/13 - Glucose 104  Anthropometrics: Patient is underweight and 23% (~32 lbs) under reported usual weight; significant  Height: 5'11" Weight: 108 lb 8 oz  UBW: 135-145 lb (last summer per pt prior to COPD diagnosis) BMI: 15.13  Estimated Energy Needs: Based on Ideal Wt (78.2 kg) to support weight gain  Kcals: 6270-3500 Protein: 94-109 Fluid: 2 L  MALNUTRITION DIAGNOSIS: Patient meets criteria for severe malnutrition in the context of chronic illness related to small cell lung cancer,  COPD as evidenced by moderate/severe fat and muscle depletion on exam, intake meeting less than 75% of estimated needs per pt recall of ~1500 kcal/day, 23% decrease from reported  usual weight in the last year per   INTERVENTION:  Educated on small frequent meals and snacks with adequate calories and protein - snack ideas and shake recipes provided Discussed soft moist high protein foods for ease of intake secondary to increased work of breathing- handout with ideas provided Educated on ways to alter texture of foods for easy to chew and swallow foods Continue drinking Boost, suggested pt switch to Boost Plus/equivalent for added calories and recommend drinking 3 daily - coupons provided as well as sample of Ensure Plus given for pt to try during infusion Contact information given  MONITORING, EVALUATION, GOAL: Patient will tolerate increased calories and protein to promote weight gain  Next Visit: Monday March 13 during infusion with Mark Yu

## 2022-02-19 NOTE — Patient Instructions (Signed)
[facility]  Discharge Instructions: Thank you for choosing [facility] to provide your oncology and hematology care.   If you have a lab appointment with the Cancer Center, please go directly to the Cancer Center and check in at the registration area.   Wear comfortable clothing and clothing appropriate for easy access to any Portacath or PICC line.   We strive to give you quality time with your provider. You may need to reschedule your appointment if you arrive late (15 or more minutes).  Arriving late affects you and other patients whose appointments are after yours.  Also, if you miss three or more appointments without notifying the office, you may be dismissed from the clinic at the provider's discretion.      For prescription refill requests, have your pharmacy contact our office and allow 72 hours for refills to be completed.    Today you received the following chemotherapy and/or immunotherapy agents: etoposide.     To help prevent nausea and vomiting after your treatment, we encourage you to take your nausea medication as directed.  BELOW ARE SYMPTOMS THAT SHOULD BE REPORTED IMMEDIATELY: . *FEVER GREATER THAN 100.4 F (38 C) OR HIGHER . *CHILLS OR SWEATING . *NAUSEA AND VOMITING THAT IS NOT CONTROLLED WITH YOUR NAUSEA MEDICATION . *UNUSUAL SHORTNESS OF BREATH . *UNUSUAL BRUISING OR BLEEDING . *URINARY PROBLEMS (pain or burning when urinating, or frequent urination) . *BOWEL PROBLEMS (unusual diarrhea, constipation, pain near the anus) . TENDERNESS IN MOUTH AND THROAT WITH OR WITHOUT PRESENCE OF ULCERS (sore throat, sores in mouth, or a toothache) . UNUSUAL RASH, SWELLING OR PAIN  . UNUSUAL VAGINAL DISCHARGE OR ITCHING   Items with * indicate a potential emergency and should be followed up as soon as possible or go to the Emergency Department if any problems should occur.  Please show the CHEMOTHERAPY ALERT CARD or IMMUNOTHERAPY ALERT  CARD at check-in to the Emergency Department and triage nurse.  Should you have questions after your visit or need to cancel or reschedule your appointment, please contact [facility]  Dept: [phone]  and follow the prompts.  Office hours are 8:00 a.m. to 4:30 p.m. Monday - Friday. Please note that voicemails left after 4:00 p.m. may not be returned until the following business day.  We are closed weekends and major holidays. You have access to a nurse at all times for urgent questions. Please call the main number to the clinic Dept: [phone] and follow the prompts.   For any non-urgent questions, you may also contact your provider using MyChart. We now offer e-Visits for anyone 18 and older to request care online for non-urgent symptoms. For details visit [email or web address].   Also download the MyChart app! Go to the app store, search "MyChart", open the app, select [facility], and log in with your MyChart username and password.  Due to Covid, a mask is required upon entering the hospital/clinic. If you do not have a mask, one will be given to you upon arrival. For doctor visits, patients may have 1 support person aged 18 or older with them. For treatment visits, patients cannot have anyone with them due to current Covid guidelines and our immunocompromised population.   

## 2022-02-20 ENCOUNTER — Ambulatory Visit: Payer: Medicare Other | Admitting: Primary Care

## 2022-02-20 ENCOUNTER — Other Ambulatory Visit: Payer: Self-pay

## 2022-02-20 ENCOUNTER — Encounter: Payer: Self-pay | Admitting: Primary Care

## 2022-02-20 ENCOUNTER — Ambulatory Visit
Admission: RE | Admit: 2022-02-20 | Discharge: 2022-02-20 | Disposition: A | Discharge status: Home / Self Care | Payer: Medicare Other | Source: Ambulatory Visit | Attending: Radiation Oncology | Admitting: Radiation Oncology

## 2022-02-20 VITALS — BP 140/80 | HR 105 | Temp 97.7°F | Ht 71.0 in | Wt 108.0 lb

## 2022-02-20 DIAGNOSIS — C3412 Malignant neoplasm of upper lobe, left bronchus or lung: Secondary | ICD-10-CM | POA: Diagnosis not present

## 2022-02-20 DIAGNOSIS — F17210 Nicotine dependence, cigarettes, uncomplicated: Secondary | ICD-10-CM

## 2022-02-20 DIAGNOSIS — J449 Chronic obstructive pulmonary disease, unspecified: Secondary | ICD-10-CM | POA: Diagnosis not present

## 2022-02-20 DIAGNOSIS — Z720 Tobacco use: Secondary | ICD-10-CM

## 2022-02-20 DIAGNOSIS — Z510 Encounter for antineoplastic radiation therapy: Secondary | ICD-10-CM | POA: Diagnosis not present

## 2022-02-20 MED ORDER — ALPRAZOLAM 1 MG PO TABS: 0.5000 mg | ORAL_TABLET | Freq: Two times a day (BID) | ORAL | Status: DC | PRN

## 2022-02-20 MED ORDER — ALPRAZOLAM 1 MG PO TABS: 0.5000 mg | ORAL_TABLET | Freq: Two times a day (BID) | ORAL | 0 refills | Status: DC | PRN

## 2022-02-20 MED ORDER — SONAFINE EX EMUL
1.0000 "application " | Freq: Once | CUTANEOUS | Status: AC
  Administered 2022-02-20: 1 via TOPICAL

## 2022-02-20 MED ORDER — ALBUTEROL SULFATE (2.5 MG/3ML) 0.083% IN NEBU: 2.5000 mg | INHALATION_SOLUTION | Freq: Four times a day (QID) | RESPIRATORY_TRACT | 3 refills | Status: DC | PRN

## 2022-02-20 NOTE — Patient Instructions (Addendum)
Recommendations: - Continues Breztri two puffs morning and evening  (Caution with Ocular disease: Use with caution in patients with increased intraocular pressure, cataracts, and/or glaucoma) - Take mucinex twice a day   Orders: - New nebulizer machine - Flutter valve   Rx: - Albuterol every 6 hours for breakthrough shortness of breath/chest tightness  - Refill xanax   Follow-up: - 3 month with Dr. Lamonte Yu

## 2022-02-20 NOTE — Assessment & Plan Note (Signed)
-   Actively trying to quit. He is down to 1 cigarette/day. Complete smoking cessation encouraged, advised he pick quit date when ready.

## 2022-02-20 NOTE — Assessment & Plan Note (Signed)
-   Patient has noticed improve in breathing with addition of [employer]. He still experiences dyspnea symptoms with exertion. Using Albuterol HFA without much relief. He has a chronic cough, difficulty expectoranting mucus. Recommend starting mucinex twice daily and using flutter valve TID. Sending in Albuterol nebulizer to use every 6 hours for sob/wheezing.

## 2022-02-20 NOTE — Assessment & Plan Note (Addendum)
-   Current receiving concurrent chemo and radiation  - Following with Dr. Julien Nordmann and Dr. Lisbeth Renshaw

## 2022-02-20 NOTE — Progress Notes (Signed)
Pt here for patient teaching.  Pt given Radiation and You booklet, skin care instructions, and Sonafine.  Reviewed areas of pertinence such as fatigue, hair loss, skin changes, throat changes, cough, and shortness of breath . Pt able to give teach back of to pat skin and use unscented/gentle soap,apply Sonafine bid and avoid applying anything to skin within 4 hours of treatment. Pt verbalizes understanding of information given and will contact nursing with any questions or concerns.    Gloriajean Dell. Leonie Green, BSN

## 2022-02-20 NOTE — Progress Notes (Signed)
@Patient  ID: Mark Yu, male    DOB: 02/25/1956, 66 y.o.   MRN: [id number]  Chief Complaint  Patient presents with   Follow-up    Referring provider: [name], MD  HPI: 66 year old male, current every day smoker. 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   02/20/2022 Patient had robotic EBUS bronchoscopy with Dr. Lamonte Sakai on 02/02/22. Dr. Lamonte Sakai called patient and reviewed bronchoscopy results with patient by phone on 02/04/22. Both left upper and right upper lobes were positive for small cell lung cancer. Patient saw Dr. Julien Nordmann on 02/09/22, recommended patient start systemic chemotherapy with concurrent radiotherapy. He saw Dr. Lisbeth Renshaw with rad/onc on 02/11/22 for consult. He started chemo this week receiving T,W,Th and radiation M-F. He still gets winded with activity. Using [place] twice daily as prescribed. Feels this is working well. He does not get much relief from albuterol rescue inhaler. He is actively cutting back on his smoking, smoking <1 cigarette/day. He has been taking xanax as needed for anxiety, needing refill. He takes xanax before chemo in the morning.    PET 5/64/3329: Hypermetabolic LEFT upper lobe pulmonary nodule consistent with bronchogenic carcinoma.  ipsilateral LEFT hilar nodal metastasis.  No mediastinal nodal metastasis or supraclavicular nodal metastasis.  No distant metastatic disease.  Nodule in the RIGHT upper lobe does not have significant metabolic activity. Recommend attention on follow-up  MRI Brain 01/20/2022: No acute intracranial process.  No evidence of metastatic disease in the brain.   CTA Chest 01/06/2022: Pulmonary nodules in the lungs bilaterally, most concerning of which is a left upper lobe pulmonary nodule measuring 2.2 x 1.3 x 0.9 cm associated with left hilar lymphadenopathy. Findings are concerning for probable neoplasm.     Biopsies of RUL and LUL 02/02/2022    Allergies  Allergen Reactions    Amoxicillin Rash     There is no immunization history on file for this patient.  Past Medical History:  Diagnosis Date   Anxiety    tx xanax   COPD (chronic obstructive pulmonary disease) (HCC)    tx inhalers   Depression    Dyspnea    with exertion   HTN (hypertension)    tx amlodipine    Tobacco History: Social History   Tobacco Use  Smoking Status Every Day   Packs/day: 1.00   Years: 50.00   Pack years: 50.00   Types: Cigarettes  Smokeless Tobacco Not on file  Tobacco Comments   3 cigarettes from Sunday to Wednesday.  1 whole cigarettes today 02/20/2022 hfb   Ready to quit: Not Answered Counseling given: Not Answered Tobacco comments: 3 cigarettes from Sunday to Wednesday.  1 whole cigarettes today 02/20/2022 hfb   Outpatient Medications Prior to Visit  Medication Sig Dispense Refill   acetaminophen (TYLENOL) 500 MG tablet Take 500 mg by mouth every 8 (eight) hours as needed for moderate pain or headache.     albuterol (VENTOLIN HFA) 108 (90 Base) MCG/ACT inhaler Inhale 2 puffs into the lungs every 4 (four) hours as needed for wheezing or shortness of breath.     ALPRAZolam (XANAX) 1 MG tablet Take 0.5-1 tablets (0.5-1 mg total) by mouth 2 (two) times daily as needed for anxiety.     amLODipine (NORVASC) 5 MG tablet Take 5 mg by mouth daily.     BREZTRI AEROSPHERE 160-9-4.8 MCG/ACT AERO Inhale 2 puffs into the lungs in the morning and at bedtime.     Calcium Carb-Cholecalciferol  500-10 MG-MCG TABS Take 1 tablet by mouth daily.     cholecalciferol (VITAMIN D) 25 MCG (1000 UNIT) tablet Take 1,000 Units by mouth daily.     fluticasone (FLONASE) 50 MCG/ACT nasal spray Place 2 sprays into both nostrils daily as needed for allergies or rhinitis.     folic acid (FOLVITE) 147 MCG tablet Take 400 mcg by mouth daily.     guaiFENesin (MUCINEX) 600 MG 12 hr tablet Take 600 mg by mouth every evening.     lidocaine-prilocaine (EMLA) cream Apply to the Port-A-Cath site 30-60  minutes before chemotherapy 30 g 0   magnesium oxide (MAG-OX) 400 MG tablet Take 400 mg by mouth daily.     prochlorperazine (COMPAZINE) 10 MG tablet Take 1 tablet (10 mg total) by mouth every 6 (six) hours as needed for nausea or vomiting. 30 tablet 0   vitamin B-12 (CYANOCOBALAMIN) 1000 MCG tablet Take 1,000 mcg by mouth daily.     methylPREDNISolone (MEDROL DOSEPAK) 4 MG TBPK tablet Use as instructed (Patient not taking: Reported on 02/20/2022) 21 tablet 0   No facility-administered medications prior to visit.    Review of Systems  Review of Systems  Constitutional: Negative.   HENT: Negative.    Respiratory:  Positive for cough and shortness of breath. Negative for chest tightness and wheezing.     Physical Exam  BP 140/80 (BP Location: Left Arm, Patient Position: Sitting, Cuff Size: Normal)    Pulse (!) 105    Temp 97.7 F (36.5 C) (Oral)    Ht 5\' 11"  (1.803 m)    Wt 108 lb (49 kg)    SpO2 100%    BMI 15.06 kg/m  Physical Exam Constitutional:      General: He is not in acute distress.    Appearance: Normal appearance.     Comments: Underweight  Cardiovascular:     Rate and Rhythm: Normal rate and regular rhythm.  Pulmonary:     Effort: Pulmonary effort is normal.     Breath sounds: Normal breath sounds. No wheezing, rhonchi or rales.     Comments: Diminished  Skin:    General: Skin is warm and dry.  Neurological:     General: No focal deficit present.     Mental Status: He is alert and oriented to person, place, and time. Mental status is at baseline.  Psychiatric:        Mood and Affect: Mood normal.        Behavior: Behavior normal.        Thought Content: Thought content normal.        Judgment: Judgment normal.     Lab Results:  CBC    Component Value Date/Time   WBC 8.9 02/16/2022 1534   WBC 11.1 (H) 01/06/2022 0243   RBC 4.18 (L) 02/16/2022 1534   HGB 12.4 (L) 02/16/2022 1534   HCT 37.6 (L) 02/16/2022 1534   PLT 262 02/16/2022 1534   MCV 90.0  02/16/2022 1534   MCH 29.7 02/16/2022 1534   MCHC 33.0 02/16/2022 1534   RDW 19.1 (H) 02/16/2022 1534   LYMPHSABS 2.5 02/16/2022 1534   MONOABS 1.0 02/16/2022 1534   EOSABS 0.0 02/16/2022 1534   BASOSABS 0.0 02/16/2022 1534    BMET    Component Value Date/Time   NA 140 02/16/2022 1534   K 3.7 02/16/2022 1534   CL 102 02/16/2022 1534   CO2 30 02/16/2022 1534   GLUCOSE 102 (H) 02/16/2022 1534   BUN 18  02/16/2022 1534   CREATININE 0.93 02/16/2022 1534   CALCIUM 9.6 02/16/2022 1534   GFRNONAA >60 02/16/2022 1534    BNP No results found for: BNP  ProBNP No results found for: PROBNP  Imaging: DG Chest Port 1 View  Result Date: 02/02/2022 CLINICAL DATA:  Status post bronchoscopy. EXAM: PORTABLE CHEST 1 VIEW COMPARISON:  January 06, 2022. FINDINGS: The heart size and mediastinal contours are within normal limits. Hyperexpansion of the lungs is noted. No pneumothorax or pleural effusion is noted. Bilateral pulmonary nodules are again noted. The visualized skeletal structures are unremarkable. IMPRESSION: No pneumothorax is noted. Hyperexpansion of the lungs is noted. Bilateral pulmonary nodules are again noted. Electronically Signed   By: Marijo Conception M.D.   On: 02/02/2022 13:35   DG C-Arm 1-60 Min-No Report  Result Date: 02/02/2022 Fluoroscopy was utilized by the requesting physician.  No radiographic interpretation.   DG C-ARM BRONCHOSCOPY  Result Date: 02/02/2022 C-ARM BRONCHOSCOPY: Fluoroscopy was utilized by the requesting physician.  No radiographic interpretation.     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 Julien Nordmann and Dr. Lisbeth Renshaw   COPD (chronic obstructive pulmonary disease) [facility]) - Patient has noticed improve in breathing with addition of Breztri Aerosphere. He still experiences dyspnea symptoms with exertion. Using Albuterol HFA without much relief. He has a  chronic cough, difficulty expectoranting mucus. Recommend starting mucinex twice daily and using flutter valve TID. Sending in Albuterol nebulizer to use every 6 hours for sob/wheezing.   Tobacco use - Actively trying to quit. He is down to 1 cigarette/day. Complete smoking cessation encouraged, advised he pick quit date when ready.   40 mins spent on case: > 50% face to face  Martyn Ehrich, NP 02/20/2022

## 2022-02-23 ENCOUNTER — Ambulatory Visit
Admission: RE | Admit: 2022-02-23 | Discharge: 2022-02-23 | Disposition: A | Discharge status: Home / Self Care | Payer: Medicare Other | Source: Ambulatory Visit | Attending: Radiation Oncology | Admitting: Radiation Oncology

## 2022-02-23 ENCOUNTER — Other Ambulatory Visit: Payer: Self-pay | Admitting: Radiology

## 2022-02-23 ENCOUNTER — Other Ambulatory Visit: Payer: Self-pay

## 2022-02-23 DIAGNOSIS — Z510 Encounter for antineoplastic radiation therapy: Secondary | ICD-10-CM | POA: Diagnosis not present

## 2022-02-24 ENCOUNTER — Ambulatory Visit (HOSPITAL_COMMUNITY)
Admission: RE | Admit: 2022-02-24 | Discharge: 2022-02-24 | Disposition: A | Discharge status: Home / Self Care | Payer: Medicare Other | Source: Ambulatory Visit | Attending: Internal Medicine | Admitting: Internal Medicine

## 2022-02-24 ENCOUNTER — Ambulatory Visit: Payer: Medicare Other | Admitting: Emergency Medicine

## 2022-02-24 ENCOUNTER — Other Ambulatory Visit: Payer: Self-pay | Admitting: Internal Medicine

## 2022-02-24 ENCOUNTER — Encounter (HOSPITAL_COMMUNITY): Payer: Self-pay

## 2022-02-24 ENCOUNTER — Ambulatory Visit
Admission: RE | Admit: 2022-02-24 | Discharge: 2022-02-24 | Disposition: A | Discharge status: Home / Self Care | Payer: Medicare Other | Source: Ambulatory Visit | Attending: Radiation Oncology | Admitting: Radiation Oncology

## 2022-02-24 ENCOUNTER — Telehealth: Payer: Self-pay

## 2022-02-24 DIAGNOSIS — F17210 Nicotine dependence, cigarettes, uncomplicated: Secondary | ICD-10-CM | POA: Insufficient documentation

## 2022-02-24 DIAGNOSIS — J449 Chronic obstructive pulmonary disease, unspecified: Secondary | ICD-10-CM | POA: Insufficient documentation

## 2022-02-24 DIAGNOSIS — F32A Depression, unspecified: Secondary | ICD-10-CM | POA: Insufficient documentation

## 2022-02-24 DIAGNOSIS — F419 Anxiety disorder, unspecified: Secondary | ICD-10-CM | POA: Insufficient documentation

## 2022-02-24 DIAGNOSIS — C3412 Malignant neoplasm of upper lobe, left bronchus or lung: Secondary | ICD-10-CM

## 2022-02-24 DIAGNOSIS — I10 Essential (primary) hypertension: Secondary | ICD-10-CM | POA: Insufficient documentation

## 2022-02-24 DIAGNOSIS — Z510 Encounter for antineoplastic radiation therapy: Secondary | ICD-10-CM | POA: Diagnosis not present

## 2022-02-24 HISTORY — PX: IR IMAGING GUIDED PORT INSERTION: IMG5740

## 2022-02-24 MED ORDER — LIDOCAINE-EPINEPHRINE 1 %-1:100000 IJ SOLN
INTRAMUSCULAR | Status: AC | PRN
  Administered 2022-02-24: 20 mL

## 2022-02-24 MED ORDER — FENTANYL CITRATE (PF) 100 MCG/2ML IJ SOLN
INTRAMUSCULAR | Status: AC
  Filled 2022-02-24: qty 2

## 2022-02-24 MED ORDER — LIDOCAINE-EPINEPHRINE 1 %-1:100000 IJ SOLN
INTRAMUSCULAR | Status: AC
  Filled 2022-02-24: qty 1

## 2022-02-24 MED ORDER — FENTANYL CITRATE (PF) 100 MCG/2ML IJ SOLN
INTRAMUSCULAR | Status: AC | PRN
  Administered 2022-02-24 (×2): 50 ug via INTRAVENOUS

## 2022-02-24 MED ORDER — SODIUM CHLORIDE 0.9 % IV SOLN: INTRAVENOUS | Status: DC

## 2022-02-24 MED ORDER — MIDAZOLAM HCL 2 MG/2ML IJ SOLN
INTRAMUSCULAR | Status: AC
  Filled 2022-02-24: qty 2

## 2022-02-24 MED ORDER — MIDAZOLAM HCL 2 MG/2ML IJ SOLN
INTRAMUSCULAR | Status: AC | PRN
Start: 2022-02-24 — End: 2022-02-24
  Administered 2022-02-24 (×2): 1 mg via INTRAVENOUS

## 2022-02-24 MED ORDER — HEPARIN SOD (PORK) LOCK FLUSH 100 UNIT/ML IV SOLN
INTRAVENOUS | Status: AC
  Filled 2022-02-24: qty 5

## 2022-02-24 NOTE — Telephone Encounter (Signed)
Patient notified of prior authorization approval for Lidocaine-Prilocaine 2.5% Cream (EMLA). Medication is approved from 02/24/22 through 02/24/23

## 2022-02-24 NOTE — Procedures (Signed)
Vascular and Interventional Radiology Procedure Note  Patient: Mark Yu DOB: [birth year] Medical Record Number: [id number] Note Date/Time: 02/24/22 4:13 PM   Performing Physician: Michaelle Birks, MD Assistant(s): None  Diagnosis: Lung cancer  Procedure: PORT PLACEMENT  Anesthesia: Conscious Sedation Complications: None Estimated Blood Loss: Minimal  Findings:  Successful right-sided port placement, with the tip of the catheter in the proximal right atrium.  Plan: Catheter ready for use.  See detailed procedure note with images in PACS. The patient tolerated the procedure well without incident or complication and was returned to Recovery in stable condition.    Michaelle Birks, MD [facility]   Pager. [place]

## 2022-02-24 NOTE — Consult Note (Signed)
Chief Complaint: Patient was seen in consultation today for Port-A-Cath placement  Referring Physician(s): Mohamed,Mohamed  Supervising Physician: Michaelle Birks  Patient Status: [facility] - Out-pt  History of Present Illness: Mark Yu is a 66 y.o. male smoker with past medical history of anxiety, depression, COPD, hypertension and recently diagnosed small cell lung cancer.  He is currently undergoing chemoradiation.  He presents today for Port-A-Cath placement to assist with treatment.  Past Medical History:  Diagnosis Date   Anxiety    tx xanax   COPD (chronic obstructive pulmonary disease) (HCC)    tx inhalers   Depression    Dyspnea    with exertion   HTN (hypertension)    tx amlodipine    Past Surgical History:  Procedure Laterality Date   BRONCHIAL BIOPSY  02/02/2022   Procedure: BRONCHIAL BIOPSIES;  Surgeon: Collene Gobble, MD;  Location: [facility] ENDOSCOPY;  Service: Pulmonary;;   BRONCHIAL BRUSHINGS  02/02/2022   Procedure: BRONCHIAL BRUSHINGS;  Surgeon: Collene Gobble, MD;  Location: [facility] ENDOSCOPY;  Service: Pulmonary;;   BRONCHIAL NEEDLE ASPIRATION BIOPSY  02/02/2022   Procedure: BRONCHIAL NEEDLE ASPIRATION BIOPSIES;  Surgeon: Collene Gobble, MD;  Location: [facility] ENDOSCOPY;  Service: Pulmonary;;   MRI     NO PAST SURGERIES     VIDEO BRONCHOSCOPY WITH ENDOBRONCHIAL ULTRASOUND N/A 02/02/2022   Procedure: VIDEO BRONCHOSCOPY WITH ENDOBRONCHIAL ULTRASOUND;  Surgeon: Collene Gobble, MD;  Location: [facility] ENDOSCOPY;  Service: Pulmonary;  Laterality: N/A;   VIDEO BRONCHOSCOPY WITH RADIAL ENDOBRONCHIAL ULTRASOUND  02/02/2022   Procedure: VIDEO BRONCHOSCOPY WITH RADIAL ENDOBRONCHIAL ULTRASOUND;  Surgeon: Collene Gobble, MD;  Location: [facility] ENDOSCOPY;  Service: Pulmonary;;    Allergies: Amoxicillin  Medications: Prior to Admission medications   Medication Sig Start Date End Date Taking? Authorizing Provider  acetaminophen (TYLENOL) 500 MG tablet Take 500 mg by mouth every 8 (eight)  hours as needed for moderate pain or headache.    [provider]  albuterol (PROVENTIL) (2.5 MG/3ML) 0.083% nebulizer solution Take 3 mLs (2.5 mg total) by nebulization every 6 (six) hours as needed for wheezing or shortness of breath. 02/20/22   Martyn Ehrich, NP  albuterol (VENTOLIN HFA) 108 (90 Base) MCG/ACT inhaler Inhale 2 puffs into the lungs every 4 (four) hours as needed for wheezing or shortness of breath.    [provider]  ALPRAZolam Duanne Moron) 1 MG tablet Take 0.5-1 tablets (0.5-1 mg total) by mouth 2 (two) times daily as needed for anxiety. 02/20/22   Martyn Ehrich, NP  amLODipine (NORVASC) 5 MG tablet Take 5 mg by mouth daily. 12/28/21   [provider]  BREZTRI AEROSPHERE 160-9-4.8 MCG/ACT AERO Inhale 2 puffs into the lungs in the morning and at bedtime. 01/02/22   [provider]  Calcium Carb-Cholecalciferol 500-10 MG-MCG TABS Take 1 tablet by mouth daily.    [provider]  cholecalciferol (VITAMIN D) 25 MCG (1000 UNIT) tablet Take 1,000 Units by mouth daily.    [provider]  fluticasone (FLONASE) 50 MCG/ACT nasal spray Place 2 sprays into both nostrils daily as needed for allergies or rhinitis.    [provider]  folic acid (FOLVITE) 132 MCG tablet Take 400 mcg by mouth daily.    [provider]  guaiFENesin (MUCINEX) 600 MG 12 hr tablet Take 600 mg by mouth every evening.    [provider]  lidocaine-prilocaine (EMLA) cream Apply to the Port-A-Cath site 30-60 minutes before chemotherapy 02/09/22   Curt Bears, MD  magnesium oxide (MAG-OX) 400 MG tablet Take 400 mg by mouth daily.    [provider]  prochlorperazine (COMPAZINE) 10 MG tablet Take 1 tablet (10 mg total) by mouth every 6 (six) hours as needed for nausea or vomiting. 02/09/22   Curt Bears, MD  vitamin B-12 (CYANOCOBALAMIN) 1000 MCG tablet Take 1,000 mcg by mouth daily.    [provider]     Family  History  Problem Relation Age of Onset   Alzheimer's disease Mother    Multiple sclerosis Father     Social History   Socioeconomic History   Marital status: Significant Other    Spouse name: Not on file   Number of children: Not on file   Years of education: Not on file   Highest education level: Not on file  Occupational History   Not on file  Tobacco Use   Smoking status: Every Day    Packs/day: 1.00    Years: 50.00    Pack years: 50.00    Types: Cigarettes   Smokeless tobacco: Not on file   Tobacco comments:    3 cigarettes from Sunday to Wednesday.  1 whole cigarettes today 02/20/2022 hfb  Vaping Use   Vaping Use: Never used  Substance and Sexual Activity   Alcohol use: Yes    Alcohol/week: 30.0 standard drinks    Types: 30 Cans of beer per week   Drug use: Yes    Types: Marijuana    Comment: Last use in 03/2021   Sexual activity: Yes  Other Topics Concern   Not on file  Social History Narrative   Not on file   Social Determinants of Health   Financial Resource Strain: Not on file  Food Insecurity: Not on file  Transportation Needs: Not on file  Physical Activity: Not on file  Stress: Not on file  Social Connections: Not on file       Review of Systems denies fever, chest pain, abdominal pain, back pain, nausea, vomiting or bleeding.  Does have occasional headaches, dyspnea with exertion, occasional cough, weight loss, and easy bruising.  Vital Signs: BP 132/81    Pulse 73    Temp 97.7 F (36.5 C) (Oral)    Resp 18    SpO2 98%   Physical Exam awake, alert.  Thin white male in no acute distress.  Chest with few scattered inspiratory wheezes /distant breath sounds.  Heart with regular rate and rhythm.  Abdomen soft, positive bowel sounds, nontender.  No lower extremity edema.  Imaging: DG Chest Port 1 View  Result Date: 02/02/2022 CLINICAL DATA:  Status post bronchoscopy. EXAM: PORTABLE CHEST 1 VIEW COMPARISON:  January 06, 2022. FINDINGS: The heart  size and mediastinal contours are within normal limits. Hyperexpansion of the lungs is noted. No pneumothorax or pleural effusion is noted. Bilateral pulmonary nodules are again noted. The visualized skeletal structures are unremarkable. IMPRESSION: No pneumothorax is noted. Hyperexpansion of the lungs is noted. Bilateral pulmonary nodules are again noted. Electronically Signed   By: Marijo Conception M.D.   On: 02/02/2022 13:35   DG C-Arm 1-60 Min-No Report  Result Date: 02/02/2022 Fluoroscopy was utilized by the requesting physician.  No radiographic interpretation.   DG C-ARM BRONCHOSCOPY  Result Date: 02/02/2022 C-ARM BRONCHOSCOPY: Fluoroscopy was utilized by the requesting physician.  No radiographic interpretation.    Labs:  CBC: Recent Labs    01/06/22 0243 01/08/22 1437 02/09/22 1504 02/16/22 1534  WBC 11.1* 3.9* 5.4 8.9  HGB 14.3  12.8* 12.8* 12.4*  HCT 44.5 38.8* 38.3* 37.6*  PLT 252 271 262 262    COAGS: No results for input(s): INR, APTT in the last 8760 hours.  BMP: Recent Labs    01/06/22 0243 01/08/22 1437 02/09/22 1504 02/16/22 1534  NA 132* 134* 141 140  K 4.6 4.7 4.0 3.7  CL 101 98 103 102  CO2 22 27 32 30  GLUCOSE 178* 99 104* 102*  BUN 12 19 10 18   CALCIUM 8.6* 9.2 9.8 9.6  CREATININE 0.79 0.79 0.63 0.93  GFRNONAA >60 >60 >60 >60    LIVER FUNCTION TESTS: Recent Labs    01/06/22 0243 01/08/22 1437 02/09/22 1504 02/16/22 1534  BILITOT 0.7 0.6 0.5 0.3  AST 51* 27 16 15   ALT 26 21 12 11   ALKPHOS 67 56 55 60  PROT 7.5 7.6 7.6 7.2  ALBUMIN 4.0 4.4 4.3 4.3    TUMOR MARKERS: No results for input(s): AFPTM, CEA, CA199, CHROMGRNA in the last 8760 hours.  Assessment and Plan: 66 y.o. male smoker with past medical history of anxiety, depression, COPD, hypertension and recently diagnosed small cell lung cancer.  He is currently undergoing chemoradiation.  He presents today for Port-A-Cath placement to assist with treatment.Risks and benefits of image  guided port-a-catheter placement was discussed with the patient including, but not limited to bleeding, infection, pneumothorax, or fibrin sheath development and need for additional procedures.  All of the patient's questions were answered, patient is agreeable to proceed. Consent signed and in chart.    Thank you for this interesting consult.  I greatly enjoyed meeting Mark Yu and look forward to participating in their care.  A copy of this report was sent to the requesting provider on this date.  Electronically Signed: D. Rowe Robert, PA-C 02/24/2022, 1:11 PM   I spent a total of  25 minutes   in face to face in clinical consultation, greater than 50% of which was counseling/coordinating care for Port-A-Cath placement

## 2022-02-25 ENCOUNTER — Ambulatory Visit
Admission: RE | Admit: 2022-02-25 | Discharge: 2022-02-25 | Disposition: A | Discharge status: Home / Self Care | Payer: Medicare Other | Source: Ambulatory Visit | Attending: Radiation Oncology | Admitting: Radiation Oncology

## 2022-02-25 ENCOUNTER — Other Ambulatory Visit: Payer: Self-pay

## 2022-02-25 ENCOUNTER — Inpatient Hospital Stay (HOSPITAL_BASED_OUTPATIENT_CLINIC_OR_DEPARTMENT_OTHER): Payer: Medicare Other | Admitting: Internal Medicine

## 2022-02-25 ENCOUNTER — Encounter: Payer: Self-pay | Admitting: Internal Medicine

## 2022-02-25 ENCOUNTER — Inpatient Hospital Stay: Payer: Medicare Other | Attending: Internal Medicine

## 2022-02-25 VITALS — BP 132/76 | HR 102 | Temp 97.9°F | Resp 19 | Ht 71.0 in | Wt 108.3 lb

## 2022-02-25 DIAGNOSIS — C3411 Malignant neoplasm of upper lobe, right bronchus or lung: Secondary | ICD-10-CM | POA: Insufficient documentation

## 2022-02-25 DIAGNOSIS — F419 Anxiety disorder, unspecified: Secondary | ICD-10-CM | POA: Insufficient documentation

## 2022-02-25 DIAGNOSIS — Z510 Encounter for antineoplastic radiation therapy: Secondary | ICD-10-CM | POA: Diagnosis present

## 2022-02-25 DIAGNOSIS — Z7952 Long term (current) use of systemic steroids: Secondary | ICD-10-CM | POA: Insufficient documentation

## 2022-02-25 DIAGNOSIS — C3412 Malignant neoplasm of upper lobe, left bronchus or lung: Secondary | ICD-10-CM

## 2022-02-25 DIAGNOSIS — F17210 Nicotine dependence, cigarettes, uncomplicated: Secondary | ICD-10-CM | POA: Diagnosis not present

## 2022-02-25 DIAGNOSIS — Z95828 Presence of other vascular implants and grafts: Secondary | ICD-10-CM

## 2022-02-25 DIAGNOSIS — I10 Essential (primary) hypertension: Secondary | ICD-10-CM | POA: Diagnosis not present

## 2022-02-25 DIAGNOSIS — R634 Abnormal weight loss: Secondary | ICD-10-CM

## 2022-02-25 DIAGNOSIS — R630 Anorexia: Secondary | ICD-10-CM | POA: Insufficient documentation

## 2022-02-25 DIAGNOSIS — Z79899 Other long term (current) drug therapy: Secondary | ICD-10-CM | POA: Diagnosis not present

## 2022-02-25 DIAGNOSIS — Z5111 Encounter for antineoplastic chemotherapy: Secondary | ICD-10-CM

## 2022-02-25 LAB — CMP (CANCER CENTER ONLY)
ALT: 15 U/L (ref 0–44)
AST: 17 U/L (ref 15–41)
Albumin: 3.9 g/dL (ref 3.5–5.0)
Alkaline Phosphatase: 53 U/L (ref 38–126)
Anion gap: 4 — ABNORMAL LOW (ref 5–15)
BUN: 13 mg/dL (ref 8–23)
CO2: 30 mmol/L (ref 22–32)
Calcium: 9.3 mg/dL (ref 8.9–10.3)
Chloride: 102 mmol/L (ref 98–111)
Creatinine: 0.53 mg/dL — ABNORMAL LOW (ref 0.61–1.24)
GFR, Estimated: >60 mL/min (ref >60)
Glucose, Bld: 118 mg/dL — ABNORMAL HIGH (ref 70–99)
Potassium: 3.8 mmol/L (ref 3.5–5.1)
Sodium: 136 mmol/L (ref 135–145)
Total Bilirubin: 0.5 mg/dL (ref 0.3–1.2)
Total Protein: 6.6 g/dL (ref 6.5–8.1)

## 2022-02-25 LAB — CBC WITH DIFFERENTIAL (CANCER CENTER ONLY)
Abs Immature Granulocytes: 0.05 10*3/uL (ref 0.00–0.07)
Basophils Absolute: 0 10*3/uL (ref 0.0–0.1)
Basophils Relative: 0 %
Eosinophils Absolute: 0 10*3/uL (ref 0.0–0.5)
Eosinophils Relative: 1 %
HCT: 29.2 % — ABNORMAL LOW (ref 39.0–52.0)
Hemoglobin: 9.9 g/dL — ABNORMAL LOW (ref 13.0–17.0)
Immature Granulocytes: 1 %
Lymphocytes Relative: 15 %
Lymphs Abs: 0.6 10*3/uL — ABNORMAL LOW (ref 0.7–4.0)
MCH: 30.3 pg (ref 26.0–34.0)
MCHC: 33.9 g/dL (ref 30.0–36.0)
MCV: 89.3 fL (ref 80.0–100.0)
Monocytes Absolute: 0.1 10*3/uL (ref 0.1–1.0)
Monocytes Relative: 2 %
Neutro Abs: 3.3 10*3/uL (ref 1.7–7.7)
Neutrophils Relative %: 81 %
Platelet Count: 96 10*3/uL — ABNORMAL LOW (ref 150–400)
RBC: 3.27 MIL/uL — ABNORMAL LOW (ref 4.22–5.81)
RDW: 17.9 % — ABNORMAL HIGH (ref 11.5–15.5)
WBC Count: 4.2 10*3/uL (ref 4.0–10.5)
nRBC: 0 % (ref 0.0–0.2)

## 2022-02-25 MED ORDER — SODIUM CHLORIDE 0.9% FLUSH
10.0000 mL | Freq: Once | INTRAVENOUS | Status: AC
  Administered 2022-02-25: 10 mL

## 2022-02-25 MED ORDER — METHYLPREDNISOLONE 4 MG PO TBPK: ORAL_TABLET | ORAL | 0 refills | Status: DC

## 2022-02-25 MED ORDER — HEPARIN SOD (PORK) LOCK FLUSH 100 UNIT/ML IV SOLN
500.0000 [IU] | Freq: Once | INTRAVENOUS | Status: AC
  Administered 2022-02-25: 500 [IU]

## 2022-02-25 NOTE — Patient Instructions (Signed)
Steps to Quit Smoking Smoking tobacco is the leading cause of preventable death. It can affect almost every organ in the body. Smoking puts you and people around you at risk for many serious, long-lasting (chronic) diseases. Quitting smoking can be hard, but it is one of the best things that you can do for your health. It is never too late to quit. How do I get ready to quit? When you decide to quit smoking, make a plan to help you succeed. Before you quit: Pick a date to quit. Set a date within the next 2 weeks to give you time to prepare. Write down the reasons why you are quitting. Keep this list in places where you will see it often. Tell your family, friends, and co-workers that you are quitting. Their support is important. Talk with your doctor about the choices that may help you quit. Find out if your health insurance will pay for these treatments. Know the people, places, things, and activities that make you want to smoke (triggers). Avoid them. What first steps can I take to quit smoking? Throw away all cigarettes at home, at work, and in your car. Throw away the things that you use when you smoke, such as ashtrays and lighters. Clean your car. Make sure to empty the ashtray. Clean your home, including curtains and carpets. What can I do to help me quit smoking? Talk with your doctor about taking medicines and seeing a counselor at the same time. You are more likely to succeed when you do both. If you are pregnant or breastfeeding, talk with your doctor about counseling or other ways to quit smoking. Do not take medicine to help you quit smoking unless your doctor tells you to do so. To quit smoking: Quit right away Quit smoking totally, instead of slowly cutting back on how much you smoke over a period of time. Go to counseling. You are more likely to quit if you go to counseling sessions regularly. Take medicine You may take medicines to help you quit. Some medicines need a  prescription, and some you can buy over-the-counter. Some medicines may contain a drug called nicotine to replace the nicotine in cigarettes. Medicines may: Help you to stop having the desire to smoke (cravings). Help to stop the problems that come when you stop smoking (withdrawal symptoms). Your doctor may ask you to use: Nicotine patches, gum, or lozenges. Nicotine inhalers or sprays. Non-nicotine medicine that is taken by mouth. Find resources Find resources and other ways to help you quit smoking and remain smoke-free after you quit. These resources are most helpful when you use them often. They include: Online chats with a counselor. Phone quitlines. Printed self-help materials. Support groups or group counseling. Text messaging programs. Mobile phone apps. Use apps on your mobile phone or tablet that can help you stick to your quit plan. There are many free apps for mobile phones and tablets as well as websites. Examples include Quit Guide from the CDC and smokefree.gov  What things can I do to make it easier to quit?  Talk to your family and friends. Ask them to support and encourage you. Call a phone quitline (1-800-QUIT-NOW), reach out to support groups, or work with a counselor. Ask people who smoke to not smoke around you. Avoid places that make you want to smoke, such as: Bars. Parties. Smoke-break areas at work. Spend time with people who do not smoke. Lower the stress in your life. Stress can make you want to   smoke. Try these things to help your stress: Getting regular exercise. Doing deep-breathing exercises. Doing yoga. Meditating. Doing a body scan. To do this, close your eyes, focus on one area of your body at a time from head to toe. Notice which parts of your body are tense. Try to relax the muscles in those areas. How will I feel when I quit smoking? Day 1 to 3 weeks Within the first 24 hours, you may start to have some problems that come from quitting tobacco.  These problems are very bad 2-3 days after you quit, but they do not often last for more than 2-3 weeks. You may get these symptoms: Mood swings. Feeling restless, nervous, angry, or annoyed. Trouble concentrating. Dizziness. Strong desire for high-sugar foods and nicotine. Weight gain. Trouble pooping (constipation). Feeling like you may vomit (nausea). Coughing or a sore throat. Changes in how the medicines that you take for other issues work in your body. Depression. Trouble sleeping (insomnia). Week 3 and afterward After the first 2-3 weeks of quitting, you may start to notice more positive results, such as: Better sense of smell and taste. Less coughing and sore throat. Slower heart rate. Lower blood pressure. Clearer skin. Better breathing. Fewer sick days. Quitting smoking can be hard. Do not give up if you fail the first time. Some people need to try a few times before they succeed. Do your best to stick to your quit plan, and talk with your doctor if you have any questions or concerns. Summary Smoking tobacco is the leading cause of preventable death. Quitting smoking can be hard, but it is one of the best things that you can do for your health. When you decide to quit smoking, make a plan to help you succeed. Quit smoking right away, not slowly over a period of time. When you start quitting, seek help from your doctor, family, or friends. This information is not intended to replace advice given to you by your health care provider. Make sure you discuss any questions you have with your health care provider. Document Revised: 08/22/2021 Document Reviewed: 03/04/2019 Elsevier Patient Education  2022 Elsevier Inc.  

## 2022-02-25 NOTE — Progress Notes (Signed)
[place] Telephone:([phone]) [phone]   Fax:([phone]) [phone]  OFFICE PROGRESS NOTE  Chow, Bebe Shaggy, MD [place] [address]  DIAGNOSIS: Limited stage (T1c, N1, M1a) small cell lung cancer presented with left upper lobe lung nodule as well as left hilar lymphadenopathy as well as right upper lobe lung nodule diagnosed in February 2023  PRIOR THERAPY: None  CURRENT THERAPY: Systemic chemotherapy with cisplatin 80 Mg/M2 on day 1 and 2 etoposide 100 Mg/M2 on days 1, 2 and 3 concurrent with radiotherapy.  First dose February 17, 2022.  Status post 1 cycle.   INTERVAL HISTORY: Mark Yu 66 y.o. male returns to the clinic today for follow-up visit.  The patient is feeling fine today with no concerning complaints.  He tolerated the first week of his treatment fairly well with no concerning adverse effects.  He denied having any current nausea, vomiting, diarrhea or constipation.  He has no chest pain but continues to have mild shortness of breath and cough with no hemoptysis.  He denied having any fever or chills.  He has no headache or visual changes.  He has some improvement of his appetite with a Medrol Dosepak and he would like to have another prescription.  He is here today for evaluation and repeat blood work.   MEDICAL HISTORY: Past Medical History:  Diagnosis Date   Anxiety    tx xanax   COPD (chronic obstructive pulmonary disease) ([place])    tx inhalers   Depression    Dyspnea    with exertion   HTN (hypertension)    tx amlodipine    ALLERGIES:  is allergic to amoxicillin.  MEDICATIONS:  Current Outpatient Medications  Medication Sig Dispense Refill   acetaminophen (TYLENOL) 500 MG tablet Take 500 mg by mouth every 8 (eight) hours as needed for moderate pain or headache.     albuterol (PROVENTIL) (2.5 MG/3ML) 0.083% nebulizer solution Take 3 mLs (2.5 mg total) by nebulization every 6 (six) hours as needed for wheezing or shortness of  breath. 150 mL 3   albuterol (VENTOLIN HFA) 108 (90 Base) MCG/ACT inhaler Inhale 2 puffs into the lungs every 4 (four) hours as needed for wheezing or shortness of breath.     ALPRAZolam (XANAX) 1 MG tablet Take 0.5-1 tablets (0.5-1 mg total) by mouth 2 (two) times daily as needed for anxiety. 30 tablet 0   amLODipine (NORVASC) 5 MG tablet Take 5 mg by mouth daily.     BREZTRI AEROSPHERE 160-9-4.8 MCG/ACT AERO Inhale 2 puffs into the lungs in the morning and at bedtime.     Calcium Carb-Cholecalciferol 500-10 MG-MCG TABS Take 1 tablet by mouth daily.     cholecalciferol (VITAMIN D) 25 MCG (1000 UNIT) tablet Take 1,000 Units by mouth daily.     fluticasone (FLONASE) 50 MCG/ACT nasal spray Place 2 sprays into both nostrils daily as needed for allergies or rhinitis.     folic acid (FOLVITE) 572 MCG tablet Take 400 mcg by mouth daily.     guaiFENesin (MUCINEX) 600 MG 12 hr tablet Take 600 mg by mouth every evening.     lidocaine-prilocaine (EMLA) cream Apply to the Port-A-Cath site 30-60 minutes before chemotherapy 30 g 0   magnesium oxide (MAG-OX) 400 MG tablet Take 400 mg by mouth daily.     prochlorperazine (COMPAZINE) 10 MG tablet Take 1 tablet (10 mg total) by mouth every 6 (six) hours as needed for nausea or vomiting. 30 tablet  0   vitamin B-12 (CYANOCOBALAMIN) 1000 MCG tablet Take 1,000 mcg by mouth daily.     No current facility-administered medications for this visit.    SURGICAL HISTORY:  Past Surgical History:  Procedure Laterality Date   BRONCHIAL BIOPSY  02/02/2022   Procedure: BRONCHIAL BIOPSIES;  Surgeon: Collene Gobble, MD;  Location: [facility] ENDOSCOPY;  Service: Pulmonary;;   BRONCHIAL BRUSHINGS  02/02/2022   Procedure: BRONCHIAL BRUSHINGS;  Surgeon: Collene Gobble, MD;  Location: [facility] ENDOSCOPY;  Service: Pulmonary;;   BRONCHIAL NEEDLE ASPIRATION BIOPSY  02/02/2022   Procedure: BRONCHIAL NEEDLE ASPIRATION BIOPSIES;  Surgeon: Collene Gobble, MD;  Location: [facility] ENDOSCOPY;  Service:  Pulmonary;;   IR IMAGING GUIDED PORT INSERTION  02/24/2022   MRI     NO PAST SURGERIES     VIDEO BRONCHOSCOPY WITH ENDOBRONCHIAL ULTRASOUND N/A 02/02/2022   Procedure: VIDEO BRONCHOSCOPY WITH ENDOBRONCHIAL ULTRASOUND;  Surgeon: Collene Gobble, MD;  Location: [facility] ENDOSCOPY;  Service: Pulmonary;  Laterality: N/A;   VIDEO BRONCHOSCOPY WITH RADIAL ENDOBRONCHIAL ULTRASOUND  02/02/2022   Procedure: VIDEO BRONCHOSCOPY WITH RADIAL ENDOBRONCHIAL ULTRASOUND;  Surgeon: Collene Gobble, MD;  Location: [facility] ENDOSCOPY;  Service: Pulmonary;;    REVIEW OF SYSTEMS:  A comprehensive review of systems was negative except for: Constitutional: positive for fatigue Respiratory: positive for cough and dyspnea on exertion   PHYSICAL EXAMINATION: General appearance: alert, cooperative, appears older than stated age, fatigued, and no distress Head: Normocephalic, without obvious abnormality, atraumatic Neck: no adenopathy, no JVD, supple, symmetrical, trachea midline, and thyroid not enlarged, symmetric, no tenderness/mass/nodules Lymph nodes: Cervical, supraclavicular, and axillary nodes normal. Resp: clear to auscultation bilaterally Back: symmetric, no curvature. ROM normal. No CVA tenderness. Cardio: regular rate and rhythm, S1, S2 normal, no murmur, click, rub or gallop GI: soft, non-tender; bowel sounds normal; no masses,  no organomegaly Extremities: extremities normal, atraumatic, no cyanosis or edema  ECOG PERFORMANCE STATUS: 1 - Symptomatic but completely ambulatory  Blood pressure 132/76, pulse (!) 102, temperature 97.9 F (36.6 C), temperature source Tympanic, resp. rate 19, height _0  (1.803 m), weight 108 lb 4.8 oz (49.1 kg), SpO2 100 %.  LABORATORY DATA: Lab Results  Component Value Date   WBC 4.2 02/25/2022   HGB 9.9 (L) 02/25/2022   HCT 29.2 (L) 02/25/2022   MCV 89.3 02/25/2022   PLT 96 (L) 02/25/2022      Chemistry      Component Value Date/Time   NA 140 02/16/2022 1534   K 3.7  02/16/2022 1534   CL 102 02/16/2022 1534   CO2 30 02/16/2022 1534   BUN 18 02/16/2022 1534   CREATININE 0.93 02/16/2022 1534      Component Value Date/Time   CALCIUM 9.6 02/16/2022 1534   ALKPHOS 60 02/16/2022 1534   AST 15 02/16/2022 1534   ALT 11 02/16/2022 1534   BILITOT 0.3 02/16/2022 1534       RADIOGRAPHIC STUDIES: DG Chest Port 1 View  Result Date: 02/02/2022 CLINICAL DATA:  Status post bronchoscopy. EXAM: PORTABLE CHEST 1 VIEW COMPARISON:  January 06, 2022. FINDINGS: The heart size and mediastinal contours are within normal limits. Hyperexpansion of the lungs is noted. No pneumothorax or pleural effusion is noted. Bilateral pulmonary nodules are again noted. The visualized skeletal structures are unremarkable. IMPRESSION: No pneumothorax is noted. Hyperexpansion of the lungs is noted. Bilateral pulmonary nodules are again noted. Electronically Signed   By: Marijo Conception M.D.   On: 02/02/2022 13:35   DG C-Arm 1-60 Min-No  Report  Result Date: 02/02/2022 Fluoroscopy was utilized by the requesting physician.  No radiographic interpretation.   IR IMAGING GUIDED PORT INSERTION  Result Date: 02/24/2022 INDICATION: LEFT lung cancer. EXAM: IMPLANTED PORT A CATH PLACEMENT WITH ULTRASOUND AND FLUOROSCOPIC GUIDANCE MEDICATIONS: None ANESTHESIA/SEDATION: Moderate (conscious) sedation was employed during this procedure. A total of Versed 2 mg and Fentanyl 100 mcg was administered intravenously. Moderate Sedation Time: 28 minutes. The patient's level of consciousness and vital signs were monitored continuously by radiology nursing throughout the procedure under my direct supervision. FLUOROSCOPY TIME:  0 minutes, 3 seconds (0 mGy) COMPLICATIONS: None immediate. PROCEDURE: The procedure, risks, benefits, and alternatives were explained to the patient. Questions regarding the procedure were encouraged and answered. The patient understands and consents to the procedure. The RIGHT neck and chest were  prepped with chlorhexidine in a sterile fashion, and a sterile drape was applied covering the operative field. Maximum barrier sterile technique with sterile gowns and gloves were used for the procedure. A timeout was performed prior to the initiation of the procedure. Local anesthesia was provided with 1% lidocaine with epinephrine. After creating a small venotomy incision, a micropuncture kit was utilized to access the internal jugular vein under direct, real-time ultrasound guidance. Ultrasound image documentation was performed. The microwire was kinked to measure appropriate catheter length. A subcutaneous port pocket was then created along the upper chest wall utilizing a combination of sharp and blunt dissection. The pocket was irrigated with sterile saline. A single lumen Non-ISP/slim power injectable port was chosen for placement. The 8 Fr catheter was tunneled from the port pocket site to the venotomy incision. The port was placed in the pocket. The external catheter was trimmed to appropriate length. At the venotomy, an 8 Fr peel-away sheath was placed over a guidewire under fluoroscopic guidance. The catheter was then placed through the sheath and the sheath was removed. Final catheter positioning was confirmed and documented with a fluoroscopic spot radiograph. The port was accessed with a Huber needle, aspirated and flushed with heparinized saline. The port pocket incision was closed with interrupted 3-0 Vicryl suture then Dermabond was applied, including at the venotomy incision. Dressings were placed. The patient tolerated the procedure well without immediate post procedural complication. IMPRESSION: Successful placement of a RIGHT internal jugular approach power injectable Port-A-Cath. The tip of the catheter is positioned within the superior cavoatrial junction. The catheter is ready for immediate use. Michaelle Birks, MD [facility]  Electronically Signed   By: Michaelle Birks M.D.   On: 02/24/2022 21:53   DG C-ARM BRONCHOSCOPY  Result Date: 02/02/2022 C-ARM BRONCHOSCOPY: Fluoroscopy was utilized by the requesting physician.  No radiographic interpretation.    ASSESSMENT AND PLAN: This is a very pleasant 66 years old white male recently diagnosed with small cell lung cancer likely extensive stage (T1c, N1, M1 a) disease diagnosed and February 2023 with the bilateral nodules in the right upper lobe as well as the left upper lobe but this could be also treated as limited stage disease because of the proximity of the lesions. The patient is currently undergoing a course of systemic chemotherapy with cisplatin 80 Mg/M2 on day 1 and etoposide 100 Mg/M2 on days 1, 2 and 3 every 3.  Status post 1 cycle.  He tolerated the first week of his treatment fairly well.  This will be concurrent with radiotherapy. I recommended for him to continue his treatment as planned and he is expected to start cycle #2 in  2 weeks. For the lack of appetite and weight loss, I will give him another prescription for Medrol Dosepak.  I discussed with him the option of treatment with [facility] but he would like to hold on it for now. He was advised to call immediately if he has any other concerning symptoms in the interval.  The patient voices understanding of current disease status and treatment options and is in agreement with the current care plan.  All questions were answered. The patient knows to call the clinic with any problems, questions or concerns. We can certainly see the patient much sooner if necessary.   Disclaimer: This note was dictated with voice recognition software. Similar sounding words can inadvertently be transcribed and may not be corrected upon review.

## 2022-02-26 ENCOUNTER — Ambulatory Visit
Admission: RE | Admit: 2022-02-26 | Discharge: 2022-02-26 | Disposition: A | Discharge status: Home / Self Care | Payer: Medicare Other | Source: Ambulatory Visit | Attending: Radiation Oncology | Admitting: Radiation Oncology

## 2022-02-26 DIAGNOSIS — Z510 Encounter for antineoplastic radiation therapy: Secondary | ICD-10-CM | POA: Diagnosis not present

## 2022-02-27 ENCOUNTER — Encounter: Payer: Self-pay | Admitting: Internal Medicine

## 2022-02-27 ENCOUNTER — Ambulatory Visit
Admission: RE | Admit: 2022-02-27 | Discharge: 2022-02-27 | Disposition: A | Discharge status: Home / Self Care | Payer: Medicare Other | Source: Ambulatory Visit | Attending: Radiation Oncology | Admitting: Radiation Oncology

## 2022-02-27 ENCOUNTER — Other Ambulatory Visit: Payer: Self-pay

## 2022-02-27 DIAGNOSIS — Z510 Encounter for antineoplastic radiation therapy: Secondary | ICD-10-CM | POA: Diagnosis not present

## 2022-03-02 ENCOUNTER — Inpatient Hospital Stay: Payer: Medicare Other

## 2022-03-02 ENCOUNTER — Ambulatory Visit
Admission: RE | Admit: 2022-03-02 | Discharge: 2022-03-02 | Disposition: A | Discharge status: Home / Self Care | Payer: Medicare Other | Source: Ambulatory Visit | Attending: Radiation Oncology | Admitting: Radiation Oncology

## 2022-03-02 ENCOUNTER — Other Ambulatory Visit: Payer: Self-pay

## 2022-03-02 DIAGNOSIS — Z95828 Presence of other vascular implants and grafts: Secondary | ICD-10-CM

## 2022-03-02 DIAGNOSIS — C3412 Malignant neoplasm of upper lobe, left bronchus or lung: Secondary | ICD-10-CM

## 2022-03-02 DIAGNOSIS — Z510 Encounter for antineoplastic radiation therapy: Secondary | ICD-10-CM | POA: Diagnosis not present

## 2022-03-02 LAB — CMP (CANCER CENTER ONLY)
ALT: 13 U/L (ref 0–44)
AST: 12 U/L — ABNORMAL LOW (ref 15–41)
Albumin: 4.1 g/dL (ref 3.5–5.0)
Alkaline Phosphatase: 41 U/L (ref 38–126)
Anion gap: 8 (ref 5–15)
BUN: 9 mg/dL (ref 8–23)
CO2: 28 mmol/L (ref 22–32)
Calcium: 9.1 mg/dL (ref 8.9–10.3)
Chloride: 103 mmol/L (ref 98–111)
Creatinine: 0.53 mg/dL — ABNORMAL LOW (ref 0.61–1.24)
GFR, Estimated: >60 mL/min (ref >60)
Glucose, Bld: 87 mg/dL (ref 70–99)
Potassium: 3.5 mmol/L (ref 3.5–5.1)
Sodium: 139 mmol/L (ref 135–145)
Total Bilirubin: 0.4 mg/dL (ref 0.3–1.2)
Total Protein: 6.6 g/dL (ref 6.5–8.1)

## 2022-03-02 LAB — CBC WITH DIFFERENTIAL (CANCER CENTER ONLY)
Abs Immature Granulocytes: 0.01 10*3/uL (ref 0.00–0.07)
Basophils Absolute: 0 10*3/uL (ref 0.0–0.1)
Basophils Relative: 1 %
Eosinophils Absolute: 0 10*3/uL (ref 0.0–0.5)
Eosinophils Relative: 1 %
HCT: 26.9 % — ABNORMAL LOW (ref 39.0–52.0)
Hemoglobin: 9.2 g/dL — ABNORMAL LOW (ref 13.0–17.0)
Immature Granulocytes: 1 %
Lymphocytes Relative: 39 %
Lymphs Abs: 0.6 10*3/uL — ABNORMAL LOW (ref 0.7–4.0)
MCH: 30.5 pg (ref 26.0–34.0)
MCHC: 34.2 g/dL (ref 30.0–36.0)
MCV: 89.1 fL (ref 80.0–100.0)
Monocytes Absolute: 0.2 10*3/uL (ref 0.1–1.0)
Monocytes Relative: 13 %
Neutro Abs: 0.7 10*3/uL — ABNORMAL LOW (ref 1.7–7.7)
Neutrophils Relative %: 45 %
Platelet Count: 59 10*3/uL — ABNORMAL LOW (ref 150–400)
RBC: 3.02 MIL/uL — ABNORMAL LOW (ref 4.22–5.81)
RDW: 17.6 % — ABNORMAL HIGH (ref 11.5–15.5)
WBC Count: 1.6 10*3/uL — ABNORMAL LOW (ref 4.0–10.5)
nRBC: 0 % (ref 0.0–0.2)

## 2022-03-02 LAB — MAGNESIUM: Magnesium: 2 mg/dL (ref 1.7–2.4)

## 2022-03-02 MED ORDER — HEPARIN SOD (PORK) LOCK FLUSH 100 UNIT/ML IV SOLN
500.0000 [IU] | Freq: Once | INTRAVENOUS | Status: AC
  Administered 2022-03-02: 500 [IU]

## 2022-03-02 MED ORDER — SODIUM CHLORIDE 0.9% FLUSH
10.0000 mL | Freq: Once | INTRAVENOUS | Status: AC
  Administered 2022-03-02: 10 mL

## 2022-03-03 ENCOUNTER — Ambulatory Visit
Admission: RE | Admit: 2022-03-03 | Discharge: 2022-03-03 | Disposition: A | Discharge status: Home / Self Care | Payer: Medicare Other | Source: Ambulatory Visit | Attending: Radiation Oncology | Admitting: Radiation Oncology

## 2022-03-03 DIAGNOSIS — Z510 Encounter for antineoplastic radiation therapy: Secondary | ICD-10-CM | POA: Diagnosis not present

## 2022-03-04 ENCOUNTER — Other Ambulatory Visit: Payer: Self-pay

## 2022-03-04 ENCOUNTER — Ambulatory Visit
Admission: RE | Admit: 2022-03-04 | Discharge: 2022-03-04 | Disposition: A | Discharge status: Home / Self Care | Payer: Medicare Other | Source: Ambulatory Visit | Attending: Radiation Oncology | Admitting: Radiation Oncology

## 2022-03-04 DIAGNOSIS — Z510 Encounter for antineoplastic radiation therapy: Secondary | ICD-10-CM | POA: Diagnosis not present

## 2022-03-04 NOTE — Progress Notes (Unsigned)
[facility] OFFICE PROGRESS NOTE  Chow, Bebe Shaggy, MD [place] [address]  DIAGNOSIS: Limited stage (T1c, N1, M1a) small cell lung cancer presented with left upper lobe lung nodule as well as left hilar lymphadenopathy as well as right upper lobe lung nodule diagnosed in February 2023  PRIOR THERAPY: None  CURRENT THERAPY: Systemic chemotherapy with cisplatin 80 Mg/M2 on day 1 and 2 etoposide 100 Mg/M2 on days 1, 2 and 3 concurrent with radiotherapy.  First dose February 17, 2022.  Status post 1 cycle.   INTERVAL HISTORY: Jiovani Mccammon 66 y.o. male returns  to the clinic today for follow-up visit. Returns to the clinic for a follow up visit. The patient is feeling well today without any concerning complaints. The patient continues to tolerate treatment  well without any adverse effects except he had some neutropenia which resolved without any intervention. His last day is radiation is scheduled for 04/02/22.  Denies any fever, chills, night sweats, or weight loss. He has stable mild dyspnea on exertion and mild cough. Denies any chest pain or hemoptysis. Denies any nausea, vomiting, diarrhea, or constipation. Denies any headache or visual changes. Denies any rashes or skin changes. The patient is here today for evaluation prior to starting cycle # 2  MEDICAL HISTORY: Past Medical History:  Diagnosis Date   Anxiety    tx xanax   COPD (chronic obstructive pulmonary disease) ([facility])    tx inhalers   Depression    Dyspnea    with exertion   HTN (hypertension)    tx amlodipine    ALLERGIES:  is allergic to amoxicillin.  MEDICATIONS:  Current Outpatient Medications  Medication Sig Dispense Refill   acetaminophen (TYLENOL) 500 MG tablet Take 500 mg by mouth every 8 (eight) hours as needed for moderate pain or headache.     albuterol (PROVENTIL) (2.5 MG/3ML) 0.083% nebulizer solution Take 3 mLs (2.5 mg total) by nebulization every 6 (six) hours as needed  for wheezing or shortness of breath. 150 mL 3   albuterol (VENTOLIN HFA) 108 (90 Base) MCG/ACT inhaler Inhale 2 puffs into the lungs every 4 (four) hours as needed for wheezing or shortness of breath.     ALPRAZolam (XANAX) 1 MG tablet Take 0.5-1 tablets (0.5-1 mg total) by mouth 2 (two) times daily as needed for anxiety. 30 tablet 0   amLODipine (NORVASC) 5 MG tablet Take 5 mg by mouth daily.     BREZTRI AEROSPHERE 160-9-4.8 MCG/ACT AERO Inhale 2 puffs into the lungs in the morning and at bedtime.     Calcium Carb-Cholecalciferol 500-10 MG-MCG TABS Take 1 tablet by mouth daily.     cholecalciferol (VITAMIN D) 25 MCG (1000 UNIT) tablet Take 1,000 Units by mouth daily.     fluticasone (FLONASE) 50 MCG/ACT nasal spray Place 2 sprays into both nostrils daily as needed for allergies or rhinitis.     folic acid (FOLVITE) 149 MCG tablet Take 400 mcg by mouth daily.     guaiFENesin (MUCINEX) 600 MG 12 hr tablet Take 600 mg by mouth every evening.     lidocaine-prilocaine (EMLA) cream Apply to the Port-A-Cath site 30-60 minutes before chemotherapy 30 g 0   magnesium oxide (MAG-OX) 400 MG tablet Take 400 mg by mouth daily.     methylPREDNISolone (MEDROL DOSEPAK) 4 MG TBPK tablet Use as instructed. 21 tablet 0   prochlorperazine (COMPAZINE) 10 MG tablet Take 1 tablet (10 mg total) by mouth every 6 (six) hours  as needed for nausea or vomiting. 30 tablet 0   vitamin B-12 (CYANOCOBALAMIN) 1000 MCG tablet Take 1,000 mcg by mouth daily.     No current facility-administered medications for this visit.    SURGICAL HISTORY:  Past Surgical History:  Procedure Laterality Date   BRONCHIAL BIOPSY  02/02/2022   Procedure: BRONCHIAL BIOPSIES;  Surgeon: Collene Gobble, MD;  Location: [facility] ENDOSCOPY;  Service: Pulmonary;;   BRONCHIAL BRUSHINGS  02/02/2022   Procedure: BRONCHIAL BRUSHINGS;  Surgeon: Collene Gobble, MD;  Location: [facility] ENDOSCOPY;  Service: Pulmonary;;   BRONCHIAL NEEDLE ASPIRATION BIOPSY  02/02/2022    Procedure: BRONCHIAL NEEDLE ASPIRATION BIOPSIES;  Surgeon: Collene Gobble, MD;  Location: [facility] ENDOSCOPY;  Service: Pulmonary;;   IR IMAGING GUIDED PORT INSERTION  02/24/2022   MRI     NO PAST SURGERIES     VIDEO BRONCHOSCOPY WITH ENDOBRONCHIAL ULTRASOUND N/A 02/02/2022   Procedure: VIDEO BRONCHOSCOPY WITH ENDOBRONCHIAL ULTRASOUND;  Surgeon: Collene Gobble, MD;  Location: [facility] ENDOSCOPY;  Service: Pulmonary;  Laterality: N/A;   VIDEO BRONCHOSCOPY WITH RADIAL ENDOBRONCHIAL ULTRASOUND  02/02/2022   Procedure: VIDEO BRONCHOSCOPY WITH RADIAL ENDOBRONCHIAL ULTRASOUND;  Surgeon: Collene Gobble, MD;  Location: [facility] ENDOSCOPY;  Service: Pulmonary;;    REVIEW OF SYSTEMS:   Review of Systems  Constitutional: Negative for appetite change, chills, fatigue, fever and unexpected weight change.  HENT:   Negative for mouth sores, nosebleeds, sore throat and trouble swallowing.   Eyes: Negative for eye problems and icterus.  Respiratory: Negative for cough, hemoptysis, shortness of breath and wheezing.   Cardiovascular: Negative for chest pain and leg swelling.  Gastrointestinal: Negative for abdominal pain, constipation, diarrhea, nausea and vomiting.  Genitourinary: Negative for bladder incontinence, difficulty urinating, dysuria, frequency and hematuria.   Musculoskeletal: Negative for back pain, gait problem, neck pain and neck stiffness.  Skin: Negative for itching and rash.  Neurological: Negative for dizziness, extremity weakness, gait problem, headaches, light-headedness and seizures.  Hematological: Negative for adenopathy. Does not bruise/bleed easily.  Psychiatric/Behavioral: Negative for confusion, depression and sleep disturbance. The patient is not nervous/anxious.     PHYSICAL EXAMINATION:  There were no vitals taken for this visit.  ECOG PERFORMANCE STATUS: {CHL ONC ECOG [id number]  Physical Exam  Constitutional: Oriented to person, place, and time and well-developed, well-nourished, and  in no distress. No distress.  HENT:  Head: Normocephalic and atraumatic.  Mouth/Throat: Oropharynx is clear and moist. No oropharyngeal exudate.  Eyes: Conjunctivae are normal. Right eye exhibits no discharge. Left eye exhibits no discharge. No scleral icterus.  Neck: Normal range of motion. Neck supple.  Cardiovascular: Normal rate, regular rhythm, normal heart sounds and intact distal pulses.   Pulmonary/Chest: Effort normal and breath sounds normal. No respiratory distress. No wheezes. No rales.  Abdominal: Soft. Bowel sounds are normal. Exhibits no distension and no mass. There is no tenderness.  Musculoskeletal: Normal range of motion. Exhibits no edema.  Lymphadenopathy:    No cervical adenopathy.  Neurological: Alert and oriented to person, place, and time. Exhibits normal muscle tone. Gait normal. Coordination normal.  Skin: Skin is warm and dry. No rash noted. Not diaphoretic. No erythema. No pallor.  Psychiatric: Mood, memory and judgment normal.  Vitals reviewed.  LABORATORY DATA: Lab Results  Component Value Date   WBC 1.6 (L) 03/02/2022   HGB 9.2 (L) 03/02/2022   HCT 26.9 (L) 03/02/2022   MCV 89.1 03/02/2022   PLT 59 (L) 03/02/2022      Chemistry  Component Value Date/Time   NA 139 03/02/2022 1309   K 3.5 03/02/2022 1309   CL 103 03/02/2022 1309   CO2 28 03/02/2022 1309   BUN 9 03/02/2022 1309   CREATININE 0.53 (L) 03/02/2022 1309      Component Value Date/Time   CALCIUM 9.1 03/02/2022 1309   ALKPHOS 41 03/02/2022 1309   AST 12 (L) 03/02/2022 1309   ALT 13 03/02/2022 1309   BILITOT 0.4 03/02/2022 1309       RADIOGRAPHIC STUDIES:  IR IMAGING GUIDED PORT INSERTION  Result Date: 02/24/2022 INDICATION: LEFT lung cancer. EXAM: IMPLANTED PORT A CATH PLACEMENT WITH ULTRASOUND AND FLUOROSCOPIC GUIDANCE MEDICATIONS: None ANESTHESIA/SEDATION: Moderate (conscious) sedation was employed during this procedure. A total of Versed 2 mg and Fentanyl 100 mcg was  administered intravenously. Moderate Sedation Time: 28 minutes. The patient's level of consciousness and vital signs were monitored continuously by radiology nursing throughout the procedure under my direct supervision. FLUOROSCOPY TIME:  0 minutes, 3 seconds (0 mGy) COMPLICATIONS: None immediate. PROCEDURE: The procedure, risks, benefits, and alternatives were explained to the patient. Questions regarding the procedure were encouraged and answered. The patient understands and consents to the procedure. The RIGHT neck and chest were prepped with chlorhexidine in a sterile fashion, and a sterile drape was applied covering the operative field. Maximum barrier sterile technique with sterile gowns and gloves were used for the procedure. A timeout was performed prior to the initiation of the procedure. Local anesthesia was provided with 1% lidocaine with epinephrine. After creating a small venotomy incision, a micropuncture kit was utilized to access the internal jugular vein under direct, real-time ultrasound guidance. Ultrasound image documentation was performed. The microwire was kinked to measure appropriate catheter length. A subcutaneous port pocket was then created along the upper chest wall utilizing a combination of sharp and blunt dissection. The pocket was irrigated with sterile saline. A single lumen Non-ISP/slim power injectable port was chosen for placement. The 8 Fr catheter was tunneled from the port pocket site to the venotomy incision. The port was placed in the pocket. The external catheter was trimmed to appropriate length. At the venotomy, an 8 Fr peel-away sheath was placed over a guidewire under fluoroscopic guidance. The catheter was then placed through the sheath and the sheath was removed. Final catheter positioning was confirmed and documented with a fluoroscopic spot radiograph. The port was accessed with a Huber needle, aspirated and flushed with heparinized saline. The port pocket incision  was closed with interrupted 3-0 Vicryl suture then Dermabond was applied, including at the venotomy incision. Dressings were placed. The patient tolerated the procedure well without immediate post procedural complication. IMPRESSION: Successful placement of a RIGHT internal jugular approach power injectable Port-A-Cath. The tip of the catheter is positioned within the superior cavoatrial junction. The catheter is ready for immediate use. Michaelle Birks, MD [facility] Electronically Signed   By: Michaelle Birks M.D.   On: 02/24/2022 21:53     ASSESSMENT/PLAN:   This is a very pleasant 66 year old Caucasian male diagnosed with small cell lung cancer likely extensive stage (T1c, N1, M1 a) disease diagnosed and February 2023 with the bilateral nodules in the right upper lobe as well as the left upper lobe but this could be also treated as limited stage disease because of the proximity of the lesions.  The patient is currently undergoing a course of systemic chemotherapy with cisplatin 80 Mg/M2 on day 1 and etoposide 100 Mg/M2 on  days 1, 2 and 3 every 3.  Status post 1 cycle.  He tolerated the first week of his treatment fairly well.  This will be concurrent with radiotherapy. He is status post 1 cycle. His last day of radiation is scheduled for 04/02/22.   The patient was seen with Dr. Julien Nordmann today. Labs were reviewed. Recommend that he *** with cycle #2 today as scheduled. Neulasta***?  We will see him back for a follow up visit in 3 weeks for evaluation and repeat blood work before starting cycle #3.   Scans?  Appetite? And weight loss.   The patient was advised to call immediately if he has any concerning symptoms in the interval. The patient voices understanding of current disease status and treatment options and is in agreement with the current care plan. All questions were answered. The patient knows to call the clinic with any problems,  questions or concerns. We can certainly see the patient much sooner if necessary    No orders of the defined types were placed in this encounter.    I spent {CHL ONC TIME VISIT - MBOBO:[id number]} counseling the patient face to face. The total time spent in the appointment was {CHL ONC TIME VISIT - NHRVA:[id number]}.  Daquann Merriott L Jonathyn Carothers, PA-C 03/04/22

## 2022-03-05 ENCOUNTER — Ambulatory Visit: Payer: Medicare Other | Admitting: Emergency Medicine

## 2022-03-05 ENCOUNTER — Ambulatory Visit
Admission: RE | Admit: 2022-03-05 | Discharge: 2022-03-05 | Disposition: A | Discharge status: Home / Self Care | Payer: Medicare Other | Source: Ambulatory Visit | Attending: Radiation Oncology | Admitting: Radiation Oncology

## 2022-03-05 DIAGNOSIS — Z510 Encounter for antineoplastic radiation therapy: Secondary | ICD-10-CM | POA: Diagnosis not present

## 2022-03-06 ENCOUNTER — Other Ambulatory Visit: Payer: Self-pay

## 2022-03-06 ENCOUNTER — Other Ambulatory Visit: Payer: Self-pay | Admitting: Radiation Oncology

## 2022-03-06 ENCOUNTER — Ambulatory Visit
Admission: RE | Admit: 2022-03-06 | Discharge: 2022-03-06 | Disposition: A | Discharge status: Home / Self Care | Payer: Medicare Other | Source: Ambulatory Visit | Attending: Radiation Oncology | Admitting: Radiation Oncology

## 2022-03-06 DIAGNOSIS — J432 Centrilobular emphysema: Secondary | ICD-10-CM | POA: Diagnosis not present

## 2022-03-06 DIAGNOSIS — E871 Hypo-osmolality and hyponatremia: Secondary | ICD-10-CM | POA: Diagnosis not present

## 2022-03-06 MED ORDER — SUCRALFATE 1 G PO TABS: 1.0000 g | ORAL_TABLET | Freq: Four times a day (QID) | ORAL | 2 refills | Status: DC

## 2022-03-08 ENCOUNTER — Encounter (HOSPITAL_COMMUNITY): Payer: Self-pay | Admitting: Emergency Medicine

## 2022-03-08 ENCOUNTER — Inpatient Hospital Stay (HOSPITAL_COMMUNITY)
Admission: EM | Admit: 2022-03-08 | Discharge: 2022-04-17 | DRG: 004 | Disposition: A | Discharge status: Home Health | Payer: Medicare Other | Attending: Internal Medicine | Admitting: Internal Medicine

## 2022-03-08 ENCOUNTER — Emergency Department (HOSPITAL_COMMUNITY): Payer: Medicare Other

## 2022-03-08 ENCOUNTER — Other Ambulatory Visit: Payer: Self-pay

## 2022-03-08 DIAGNOSIS — G4700 Insomnia, unspecified: Secondary | ICD-10-CM | POA: Diagnosis present

## 2022-03-08 DIAGNOSIS — Z9289 Personal history of other medical treatment: Secondary | ICD-10-CM

## 2022-03-08 DIAGNOSIS — R54 Age-related physical debility: Secondary | ICD-10-CM | POA: Diagnosis present

## 2022-03-08 DIAGNOSIS — D630 Anemia in neoplastic disease: Secondary | ICD-10-CM | POA: Diagnosis present

## 2022-03-08 DIAGNOSIS — Z515 Encounter for palliative care: Secondary | ICD-10-CM

## 2022-03-08 DIAGNOSIS — J432 Centrilobular emphysema: Principal | ICD-10-CM | POA: Diagnosis present

## 2022-03-08 DIAGNOSIS — Z880 Allergy status to penicillin: Secondary | ICD-10-CM

## 2022-03-08 DIAGNOSIS — R5381 Other malaise: Secondary | ICD-10-CM

## 2022-03-08 DIAGNOSIS — I471 Supraventricular tachycardia: Secondary | ICD-10-CM | POA: Diagnosis not present

## 2022-03-08 DIAGNOSIS — D6481 Anemia due to antineoplastic chemotherapy: Secondary | ICD-10-CM

## 2022-03-08 DIAGNOSIS — I10 Essential (primary) hypertension: Secondary | ICD-10-CM

## 2022-03-08 DIAGNOSIS — J9501 Hemorrhage from tracheostomy stoma: Secondary | ICD-10-CM | POA: Diagnosis not present

## 2022-03-08 DIAGNOSIS — J690 Pneumonitis due to inhalation of food and vomit: Secondary | ICD-10-CM | POA: Diagnosis not present

## 2022-03-08 DIAGNOSIS — Z9221 Personal history of antineoplastic chemotherapy: Secondary | ICD-10-CM

## 2022-03-08 DIAGNOSIS — B9789 Other viral agents as the cause of diseases classified elsewhere: Secondary | ICD-10-CM | POA: Diagnosis present

## 2022-03-08 DIAGNOSIS — Z4659 Encounter for fitting and adjustment of other gastrointestinal appliance and device: Secondary | ICD-10-CM

## 2022-03-08 DIAGNOSIS — F419 Anxiety disorder, unspecified: Secondary | ICD-10-CM | POA: Diagnosis present

## 2022-03-08 DIAGNOSIS — C779 Secondary and unspecified malignant neoplasm of lymph node, unspecified: Secondary | ICD-10-CM

## 2022-03-08 DIAGNOSIS — J189 Pneumonia, unspecified organism: Secondary | ICD-10-CM

## 2022-03-08 DIAGNOSIS — Z681 Body mass index (BMI) 19 or less, adult: Secondary | ICD-10-CM

## 2022-03-08 DIAGNOSIS — J9601 Acute respiratory failure with hypoxia: Secondary | ICD-10-CM | POA: Diagnosis not present

## 2022-03-08 DIAGNOSIS — J1289 Other viral pneumonia: Secondary | ICD-10-CM | POA: Diagnosis present

## 2022-03-08 DIAGNOSIS — E43 Unspecified severe protein-calorie malnutrition: Secondary | ICD-10-CM | POA: Insufficient documentation

## 2022-03-08 DIAGNOSIS — J9621 Acute and chronic respiratory failure with hypoxia: Secondary | ICD-10-CM | POA: Diagnosis present

## 2022-03-08 DIAGNOSIS — Z79899 Other long term (current) drug therapy: Secondary | ICD-10-CM

## 2022-03-08 DIAGNOSIS — J9622 Acute and chronic respiratory failure with hypercapnia: Secondary | ICD-10-CM | POA: Diagnosis present

## 2022-03-08 DIAGNOSIS — E876 Hypokalemia: Secondary | ICD-10-CM | POA: Diagnosis not present

## 2022-03-08 DIAGNOSIS — B359 Dermatophytosis, unspecified: Secondary | ICD-10-CM | POA: Diagnosis present

## 2022-03-08 DIAGNOSIS — R739 Hyperglycemia, unspecified: Secondary | ICD-10-CM | POA: Diagnosis not present

## 2022-03-08 DIAGNOSIS — J441 Chronic obstructive pulmonary disease with (acute) exacerbation: Secondary | ICD-10-CM

## 2022-03-08 DIAGNOSIS — E860 Dehydration: Secondary | ICD-10-CM | POA: Diagnosis present

## 2022-03-08 DIAGNOSIS — F17210 Nicotine dependence, cigarettes, uncomplicated: Secondary | ICD-10-CM | POA: Diagnosis present

## 2022-03-08 DIAGNOSIS — J449 Chronic obstructive pulmonary disease, unspecified: Secondary | ICD-10-CM

## 2022-03-08 DIAGNOSIS — E162 Hypoglycemia, unspecified: Secondary | ICD-10-CM | POA: Diagnosis not present

## 2022-03-08 DIAGNOSIS — R1314 Dysphagia, pharyngoesophageal phase: Secondary | ICD-10-CM | POA: Diagnosis not present

## 2022-03-08 DIAGNOSIS — T451X5A Adverse effect of antineoplastic and immunosuppressive drugs, initial encounter: Secondary | ICD-10-CM | POA: Diagnosis present

## 2022-03-08 DIAGNOSIS — D6489 Other specified anemias: Secondary | ICD-10-CM | POA: Diagnosis present

## 2022-03-08 DIAGNOSIS — J9690 Respiratory failure, unspecified, unspecified whether with hypoxia or hypercapnia: Secondary | ICD-10-CM

## 2022-03-08 DIAGNOSIS — L89890 Pressure ulcer of other site, unstageable: Secondary | ICD-10-CM | POA: Diagnosis not present

## 2022-03-08 DIAGNOSIS — T380X5A Adverse effect of glucocorticoids and synthetic analogues, initial encounter: Secondary | ICD-10-CM | POA: Diagnosis not present

## 2022-03-08 DIAGNOSIS — C3412 Malignant neoplasm of upper lobe, left bronchus or lung: Secondary | ICD-10-CM | POA: Diagnosis present

## 2022-03-08 DIAGNOSIS — Z820 Family history of epilepsy and other diseases of the nervous system: Secondary | ICD-10-CM

## 2022-03-08 DIAGNOSIS — Z978 Presence of other specified devices: Secondary | ICD-10-CM

## 2022-03-08 DIAGNOSIS — H538 Other visual disturbances: Secondary | ICD-10-CM | POA: Diagnosis not present

## 2022-03-08 DIAGNOSIS — R64 Cachexia: Secondary | ICD-10-CM | POA: Diagnosis present

## 2022-03-08 DIAGNOSIS — F32A Depression, unspecified: Secondary | ICD-10-CM

## 2022-03-08 DIAGNOSIS — R1313 Dysphagia, pharyngeal phase: Secondary | ICD-10-CM | POA: Diagnosis not present

## 2022-03-08 DIAGNOSIS — G9341 Metabolic encephalopathy: Secondary | ICD-10-CM | POA: Diagnosis not present

## 2022-03-08 DIAGNOSIS — I480 Paroxysmal atrial fibrillation: Secondary | ICD-10-CM

## 2022-03-08 DIAGNOSIS — E871 Hypo-osmolality and hyponatremia: Secondary | ICD-10-CM | POA: Diagnosis not present

## 2022-03-08 DIAGNOSIS — Z20822 Contact with and (suspected) exposure to covid-19: Secondary | ICD-10-CM | POA: Diagnosis present

## 2022-03-08 DIAGNOSIS — Z930 Tracheostomy status: Secondary | ICD-10-CM

## 2022-03-08 DIAGNOSIS — R195 Other fecal abnormalities: Secondary | ICD-10-CM | POA: Diagnosis not present

## 2022-03-08 DIAGNOSIS — Y92239 Unspecified place in hospital as the place of occurrence of the external cause: Secondary | ICD-10-CM | POA: Diagnosis not present

## 2022-03-08 HISTORY — DX: Hypo-osmolality and hyponatremia: E87.1

## 2022-03-08 LAB — CBC WITH DIFFERENTIAL/PLATELET
Abs Immature Granulocytes: 0.05 10*3/uL (ref 0.00–0.07)
Basophils Absolute: 0 10*3/uL (ref 0.0–0.1)
Basophils Relative: 0 %
Eosinophils Absolute: 0 10*3/uL (ref 0.0–0.5)
Eosinophils Relative: 0 %
HCT: 30.8 % — ABNORMAL LOW (ref 39.0–52.0)
Hemoglobin: 9.9 g/dL — ABNORMAL LOW (ref 13.0–17.0)
Immature Granulocytes: 1 %
Lymphocytes Relative: 6 %
Lymphs Abs: 0.3 10*3/uL — ABNORMAL LOW (ref 0.7–4.0)
MCH: 30.1 pg (ref 26.0–34.0)
MCHC: 32.1 g/dL (ref 30.0–36.0)
MCV: 93.6 fL (ref 80.0–100.0)
Monocytes Absolute: 1.5 10*3/uL — ABNORMAL HIGH (ref 0.1–1.0)
Monocytes Relative: 28 %
Neutro Abs: 3.5 10*3/uL (ref 1.7–7.7)
Neutrophils Relative %: 65 %
Platelet Morphology: NORMAL
Platelets: 220 10*3/uL (ref 150–400)
RBC: 3.29 MIL/uL — ABNORMAL LOW (ref 4.22–5.81)
RDW: 19 % — ABNORMAL HIGH (ref 11.5–15.5)
WBC: 5.4 10*3/uL (ref 4.0–10.5)
nRBC: 0 % (ref 0.0–0.2)

## 2022-03-08 LAB — BLOOD GAS, ARTERIAL
Acid-Base Excess: 1.7 mmol/L (ref 0.0–2.0)
Bicarbonate: 26.6 mmol/L (ref 20.0–28.0)
Drawn by: 25788
FIO2: 32 %
O2 Saturation: 93.9 %
Patient temperature: 37
pCO2 arterial: 42 mmHg (ref 32–48)
pH, Arterial: 7.41 (ref 7.35–7.45)
pO2, Arterial: 64 mmHg — ABNORMAL LOW (ref 83–108)

## 2022-03-08 LAB — BASIC METABOLIC PANEL
Anion gap: 10 (ref 5–15)
BUN: 24 mg/dL — ABNORMAL HIGH (ref 8–23)
CO2: 25 mmol/L (ref 22–32)
Calcium: 8.4 mg/dL — ABNORMAL LOW (ref 8.9–10.3)
Chloride: 94 mmol/L — ABNORMAL LOW (ref 98–111)
Creatinine, Ser: 0.63 mg/dL (ref 0.61–1.24)
GFR, Estimated: >60 mL/min (ref >60)
Glucose, Bld: 118 mg/dL — ABNORMAL HIGH (ref 70–99)
Potassium: 3.7 mmol/L (ref 3.5–5.1)
Sodium: 129 mmol/L — ABNORMAL LOW (ref 135–145)

## 2022-03-08 LAB — TROPONIN I (HIGH SENSITIVITY): Troponin I (High Sensitivity): 9 ng/L (ref <18)

## 2022-03-08 LAB — RESP PANEL BY RT-PCR (FLU A&B, COVID) ARPGX2
Influenza A by PCR: NEGATIVE
Influenza B by PCR: NEGATIVE
SARS Coronavirus 2 by RT PCR: NEGATIVE

## 2022-03-08 LAB — BRAIN NATRIURETIC PEPTIDE: B Natriuretic Peptide: 38 pg/mL (ref 0.0–100.0)

## 2022-03-08 LAB — D-DIMER, QUANTITATIVE: D-Dimer, Quant: 1.13 ug/mL-FEU — ABNORMAL HIGH (ref 0.00–0.50)

## 2022-03-08 MED ORDER — SUCRALFATE 1 G PO TABS
1.0000 g | ORAL_TABLET | Freq: Four times a day (QID) | ORAL | Status: DC
  Administered 2022-03-08 – 2022-03-09 (×5): 1 g via ORAL
  Filled 2022-03-08 (×5): qty 1

## 2022-03-08 MED ORDER — VITAMIN D3 25 MCG (1000 UNIT) PO TABS
1000.0000 [IU] | ORAL_TABLET | Freq: Every day | ORAL | Status: DC
  Administered 2022-03-08 – 2022-03-09 (×2): 1000 [IU] via ORAL
  Filled 2022-03-08 (×2): qty 1

## 2022-03-08 MED ORDER — MAGNESIUM OXIDE -MG SUPPLEMENT 400 (240 MG) MG PO TABS
400.0000 mg | ORAL_TABLET | Freq: Every day | ORAL | Status: DC
  Administered 2022-03-08 – 2022-03-09 (×2): 400 mg via ORAL
  Filled 2022-03-08 (×2): qty 1

## 2022-03-08 MED ORDER — ACETAMINOPHEN 325 MG PO TABS
650.0000 mg | ORAL_TABLET | Freq: Four times a day (QID) | ORAL | Status: DC | PRN
  Administered 2022-03-09: 650 mg via ORAL
  Filled 2022-03-08: qty 2

## 2022-03-08 MED ORDER — ALBUTEROL SULFATE (2.5 MG/3ML) 0.083% IN NEBU: 5.0000 mg | INHALATION_SOLUTION | Freq: Once | RESPIRATORY_TRACT | Status: AC

## 2022-03-08 MED ORDER — BUDESON-GLYCOPYRROL-FORMOTEROL 160-9-4.8 MCG/ACT IN AERO: 2.0000 | INHALATION_SPRAY | Freq: Two times a day (BID) | RESPIRATORY_TRACT | Status: DC

## 2022-03-08 MED ORDER — CALCIUM CARB-CHOLECALCIFEROL 500-10 MG-MCG PO TABS: 1.0000 | ORAL_TABLET | Freq: Every day | ORAL | Status: DC

## 2022-03-08 MED ORDER — GUAIFENESIN ER 600 MG PO TB12
600.0000 mg | ORAL_TABLET | Freq: Two times a day (BID) | ORAL | Status: DC
  Administered 2022-03-08 – 2022-03-09 (×2): 600 mg via ORAL
  Filled 2022-03-08 (×2): qty 1

## 2022-03-08 MED ORDER — FLUTICASONE PROPIONATE 50 MCG/ACT NA SUSP
2.0000 | Freq: Every day | NASAL | Status: DC | PRN
  Filled 2022-03-08: qty 16

## 2022-03-08 MED ORDER — AMLODIPINE BESYLATE 5 MG PO TABS
5.0000 mg | ORAL_TABLET | Freq: Every day | ORAL | Status: DC
  Administered 2022-03-08 – 2022-03-09 (×2): 5 mg via ORAL
  Filled 2022-03-08 (×2): qty 1

## 2022-03-08 MED ORDER — VITAMIN B-12 1000 MCG PO TABS
1000.0000 ug | ORAL_TABLET | Freq: Every day | ORAL | Status: DC
  Administered 2022-03-08 – 2022-03-09 (×2): 1000 ug via ORAL
  Filled 2022-03-08 (×2): qty 1

## 2022-03-08 MED ORDER — MOMETASONE FURO-FORMOTEROL FUM 100-5 MCG/ACT IN AERO
2.0000 | INHALATION_SPRAY | Freq: Two times a day (BID) | RESPIRATORY_TRACT | Status: DC
  Administered 2022-03-08 – 2022-03-09 (×2): 2 via RESPIRATORY_TRACT
  Filled 2022-03-08: qty 8.8

## 2022-03-08 MED ORDER — SODIUM CHLORIDE 0.9 % IV SOLN: INTRAVENOUS | Status: DC

## 2022-03-08 MED ORDER — SODIUM CHLORIDE 0.9 % IV BOLUS
500.0000 mL | Freq: Once | INTRAVENOUS | Status: AC
  Administered 2022-03-08: 500 mL via INTRAVENOUS

## 2022-03-08 MED ORDER — ALBUTEROL SULFATE (2.5 MG/3ML) 0.083% IN NEBU
2.5000 mg | INHALATION_SOLUTION | RESPIRATORY_TRACT | Status: DC | PRN
  Administered 2022-03-15 – 2022-03-27 (×4): 2.5 mg via RESPIRATORY_TRACT
  Filled 2022-03-08 (×4): qty 3

## 2022-03-08 MED ORDER — SODIUM CHLORIDE 0.9% FLUSH
3.0000 mL | Freq: Two times a day (BID) | INTRAVENOUS | Status: DC
  Administered 2022-03-08 – 2022-04-14 (×39): 3 mL via INTRAVENOUS

## 2022-03-08 MED ORDER — CALCIUM CARBONATE ANTACID 500 MG PO CHEW
1.0000 | CHEWABLE_TABLET | Freq: Every day | ORAL | Status: DC
  Administered 2022-03-08 – 2022-03-09 (×2): 200 mg via ORAL
  Filled 2022-03-08 (×2): qty 1

## 2022-03-08 MED ORDER — ALBUTEROL SULFATE (2.5 MG/3ML) 0.083% IN NEBU
INHALATION_SOLUTION | RESPIRATORY_TRACT | Status: AC
Start: 2022-03-08 — End: 2022-03-08
  Administered 2022-03-08: 5 mg via RESPIRATORY_TRACT
  Filled 2022-03-08: qty 3

## 2022-03-08 MED ORDER — ACETAMINOPHEN 650 MG RE SUPP: 650.0000 mg | Freq: Four times a day (QID) | RECTAL | Status: DC | PRN

## 2022-03-08 MED ORDER — IOHEXOL 350 MG/ML SOLN
75.0000 mL | Freq: Once | INTRAVENOUS | Status: AC | PRN
  Administered 2022-03-08: 75 mL via INTRAVENOUS

## 2022-03-08 MED ORDER — FOLIC ACID 1 MG PO TABS
1.0000 mg | ORAL_TABLET | Freq: Every day | ORAL | Status: DC
  Administered 2022-03-08 – 2022-03-09 (×2): 1 mg via ORAL
  Filled 2022-03-08 (×2): qty 1

## 2022-03-08 MED ORDER — ALPRAZOLAM 0.5 MG PO TABS
0.5000 mg | ORAL_TABLET | Freq: Two times a day (BID) | ORAL | Status: DC | PRN
  Administered 2022-03-09: 1 mg via ORAL
  Filled 2022-03-08: qty 2

## 2022-03-08 MED ORDER — POLYETHYLENE GLYCOL 3350 17 G PO PACK: 17.0000 g | PACK | Freq: Every day | ORAL | Status: DC | PRN

## 2022-03-08 MED ORDER — ENOXAPARIN SODIUM 40 MG/0.4ML IJ SOSY
40.0000 mg | PREFILLED_SYRINGE | INTRAMUSCULAR | Status: DC
  Administered 2022-03-08 – 2022-03-18 (×11): 40 mg via SUBCUTANEOUS
  Filled 2022-03-08 (×12): qty 0.4

## 2022-03-08 MED ORDER — UMECLIDINIUM BROMIDE 62.5 MCG/ACT IN AEPB
1.0000 | INHALATION_SPRAY | Freq: Every day | RESPIRATORY_TRACT | Status: DC
  Administered 2022-03-09: 1 via RESPIRATORY_TRACT
  Filled 2022-03-08: qty 7

## 2022-03-08 MED ORDER — MAGNESIUM SULFATE 2 GM/50ML IV SOLN
2.0000 g | Freq: Once | INTRAVENOUS | Status: AC
  Administered 2022-03-08: 2 g via INTRAVENOUS
  Filled 2022-03-08: qty 50

## 2022-03-08 MED ORDER — PREDNISONE 20 MG PO TABS
40.0000 mg | ORAL_TABLET | Freq: Every day | ORAL | Status: DC
  Administered 2022-03-09: 40 mg via ORAL
  Filled 2022-03-08: qty 2

## 2022-03-08 MED ORDER — IPRATROPIUM-ALBUTEROL 0.5-2.5 (3) MG/3ML IN SOLN
3.0000 mL | Freq: Four times a day (QID) | RESPIRATORY_TRACT | Status: DC
  Administered 2022-03-08 – 2022-03-10 (×6): 3 mL via RESPIRATORY_TRACT
  Filled 2022-03-08 (×7): qty 3

## 2022-03-08 NOTE — ED Provider Notes (Signed)
[place] DEPT Provider Note   CSN: [id number] Arrival date & time: 03/08/22  1545     History  Chief Complaint  Patient presents with   Shortness of Breath    Mark Yu is a 66 y.o. male.  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  He recently was diagnosed with lung cancer last month and started his chemotherapy recently.  He completed treatment last week along with radiation.  He said that he has been progressively short of breath throughout the weekend.  He gets short of breath with minimal activity.  He has had some pain in the center of his chest which he feels is from the radiation that he recently received.  He currently denies any chest pain.  No other chest pain.  No leg swelling or calf pain.  No fevers.  He has a cough which is somewhat productive of white sputum.  He received nebulizer treatments and Solu-Medrol by EMS with some improvement in symptoms although now he feels more short of breath since the treatment stopped.  He is not on home oxygen.  He was noted to be hypoxic with an oxygen saturation of 86% on EMS arrival.  This has improved with nebulizer treatments although currently he is 89% on room air.  Was placed on nasal cannula oxygen.      Home Medications Prior to Admission medications   Medication Sig Start Date End Date Taking? Authorizing Provider  acetaminophen (TYLENOL) 500 MG tablet Take 500 mg by mouth every 8 (eight) hours as needed for moderate pain or headache.   Yes [provider]  albuterol (PROVENTIL) (2.5 MG/3ML) 0.083% nebulizer solution Take 3 mLs (2.5 mg total) by nebulization every 6 (six) hours as needed for wheezing or shortness of breath. 02/20/22  Yes Martyn Ehrich, NP  albuterol (VENTOLIN HFA) 108 (90 Base) MCG/ACT inhaler Inhale 2 puffs into the lungs every 4 (four)  hours as needed for wheezing or shortness of breath.   Yes [provider]  ALPRAZolam Duanne Moron) 1 MG tablet Take 0.5-1 tablets (0.5-1 mg total) by mouth 2 (two) times daily as needed for anxiety. 02/20/22  Yes Martyn Ehrich, NP  amLODipine (NORVASC) 5 MG tablet Take 5 mg by mouth daily. 12/28/21  Yes [provider]  BREZTRI AEROSPHERE 160-9-4.8 MCG/ACT AERO Inhale 2 puffs into the lungs in the morning and at bedtime. 01/02/22  Yes [provider]  Calcium Carb-Cholecalciferol 500-10 MG-MCG TABS Take 1 tablet by mouth daily.   Yes [provider]  cholecalciferol (VITAMIN D) 25 MCG (1000 UNIT) tablet Take 1,000 Units by mouth daily.   Yes [provider]  fluticasone (FLONASE) 50 MCG/ACT nasal spray Place 2 sprays into both nostrils daily as needed for allergies or rhinitis.   Yes [provider]  folic acid (FOLVITE) 812 MCG tablet Take 400 mcg by mouth daily.   Yes [provider]  guaiFENesin (MUCINEX) 600 MG 12 hr tablet Take 600 mg by mouth every evening.   Yes [provider]  magnesium oxide (MAG-OX) 400 MG tablet Take 400 mg by mouth daily.   Yes [provider]  prochlorperazine (COMPAZINE) 10 MG tablet Take 1 tablet (10 mg total) by mouth every 6 (six) hours as needed for nausea or vomiting. 02/09/22  Yes Curt Bears, MD  sucralfate (CARAFATE) 1 g tablet Take 1 tablet (  1 g total) by mouth 4 (four) times daily. Dissolve each tablet in 15 cc water before use. 03/06/22  Yes Kyung Rudd, MD  vitamin B-12 (CYANOCOBALAMIN) 1000 MCG tablet Take 1,000 mcg by mouth daily.   Yes [provider]  lidocaine-prilocaine (EMLA) cream Apply to the Port-A-Cath site 30-60 minutes before chemotherapy 02/09/22   Curt Bears, MD  methylPREDNISolone (MEDROL DOSEPAK) 4 MG TBPK tablet Use as instructed. 02/25/22   Curt Bears, MD      Allergies    Amoxicillin    Review of Systems   Review of Systems   Constitutional:  Positive for appetite change and fatigue. Negative for chills, diaphoresis and fever.  HENT:  Negative for congestion, rhinorrhea and sneezing.   Eyes: Negative.   Respiratory:  Positive for cough and shortness of breath. Negative for chest tightness.   Cardiovascular:  Positive for chest pain. Negative for leg swelling.  Gastrointestinal:  Negative for abdominal pain, blood in stool, diarrhea, nausea and vomiting.  Genitourinary:  Negative for difficulty urinating, flank pain, frequency and hematuria.  Musculoskeletal:  Negative for arthralgias and back pain.  Skin:  Negative for rash.  Neurological:  Negative for dizziness, speech difficulty, weakness, numbness and headaches.   Physical Exam Updated Vital Signs BP 124/81    Pulse (!) 112    Temp 98.4 F (36.9 C) (Oral)    Resp (!) 28    SpO2 98%  Physical Exam Constitutional:      General: He is in acute distress.     Appearance: He is well-developed.  HENT:     Head: Normocephalic and atraumatic.  Eyes:     Pupils: Pupils are equal, round, and reactive to light.  Cardiovascular:     Rate and Rhythm: Normal rate and regular rhythm.     Heart sounds: Normal heart sounds.  Pulmonary:     Effort: Tachypnea and accessory muscle usage present. No respiratory distress.     Breath sounds: Decreased breath sounds present. No wheezing or rales.  Chest:     Chest wall: No tenderness.  Abdominal:     General: Bowel sounds are normal.     Palpations: Abdomen is soft.     Tenderness: There is no abdominal tenderness. There is no guarding or rebound.  Musculoskeletal:        General: Normal range of motion.     Cervical back: Normal range of motion and neck supple.     Comments: No edema or calf tenderness  Lymphadenopathy:     Cervical: No cervical adenopathy.  Skin:    General: Skin is warm and dry.     Findings: No rash.  Neurological:     Mental Status: He is alert and oriented to person, place, and time.     ED Results / Procedures / Treatments   Labs (all labs ordered are listed, but only abnormal results are displayed) Labs Reviewed  BASIC METABOLIC PANEL - Abnormal; Notable for the following components:      Result Value   Sodium 129 (*)    Chloride 94 (*)    Glucose, Bld 118 (*)    BUN 24 (*)    Calcium 8.4 (*)    All other components within normal limits  CBC WITH DIFFERENTIAL/PLATELET - Abnormal; Notable for the following components:   RBC 3.29 (*)    Hemoglobin 9.9 (*)    HCT 30.8 (*)    RDW 19.0 (*)    Lymphs Abs 0.3 (*)  Monocytes Absolute 1.5 (*)    All other components within normal limits  BLOOD GAS, ARTERIAL - Abnormal; Notable for the following components:   pO2, Arterial 64 (*)    All other components within normal limits  D-DIMER, QUANTITATIVE - Abnormal; Notable for the following components:   D-Dimer, Quant 1.13 (*)    All other components within normal limits  RESP PANEL BY RT-PCR (FLU A&B, COVID) ARPGX2  RESPIRATORY PANEL BY PCR  BRAIN NATRIURETIC PEPTIDE  HIV ANTIBODY (ROUTINE TESTING W REFLEX)  BASIC METABOLIC PANEL  CBC  TROPONIN I (HIGH SENSITIVITY)    EKG EKG Interpretation  Date/Time:  Sunday March 08 2022 16:13:07 EDT Ventricular Rate:  133 PR Interval:  139 QRS Duration: 83 QT Interval:  295 QTC Calculation: 439 R Axis:   97 Text Interpretation: Sinus tachycardia Atrial premature complex LAE, consider biatrial enlargement Right axis deviation Artifact in lead(s) I II III aVR aVL aVF V6 since last tracing no significant change Confirmed by Malvin Johns [phone]) on 03/08/2022 4:14:23 PM  Radiology CT Angio Chest PE W/Cm &/Or Wo Cm  Result Date: 03/08/2022 CLINICAL DATA:  Shortness of breath, elevated D-dimer question pulmonary embolism, history of LEFT lung cancer EXAM: CT ANGIOGRAPHY CHEST WITH CONTRAST TECHNIQUE: Multidetector CT imaging of the chest was performed using the standard protocol during bolus administration of intravenous  contrast. Multiplanar CT image reconstructions and MIPs were obtained to evaluate the vascular anatomy. RADIATION DOSE REDUCTION: This exam was performed according to the departmental dose-optimization program which includes automated exposure control, adjustment of the mA and/or kV according to patient size and/or use of iterative reconstruction technique. CONTRAST:  6mL OMNIPAQUE IOHEXOL 350 MG/ML SOLN IV COMPARISON:  01/06/2022 FINDINGS: Cardiovascular: Atherosclerotic calcifications aorta, proximal great vessels, and coronary arteries. Heart unremarkable. No pericardial effusion. Pulmonary arteries well opacified and patent. No evidence of pulmonary embolism. Mediastinum/Nodes: Esophagus unremarkable. Base of cervical region normal appearance. No thoracic adenopathy. Previously identified enlarged LEFT hilar lymph node is now normal in size at 9 mm short axis. Lungs/Pleura: Emphysematous changes with bullous disease at apices. Anterior RIGHT upper lobe mass, partially calcified, 9 x 8 mm unchanged question granuloma. Few additional scattered calcified granulomata. Central peribronchial thickening. Again identified LEFT upper lobe opacity 16 x 10 mm previously 22 x 13 mm, no neoplasm. Scattered mucus within the bronchus intermedius and RIGHT lower lobe bronchus as well as scattered LEFT lung bronchi. No acute infiltrate, pleural effusion, or pneumothorax. Upper Abdomen: Visualized upper abdomen unremarkable Musculoskeletal: Diffuse osseous demineralization. No acute osseous findings. Review of the MIP images confirms the above findings. IMPRESSION: No evidence of pulmonary embolism. No acute intrathoracic abnormalities. Scattered atherosclerotic calcifications including coronary arteries. 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 Interval decrease in size of LEFT upper lobe neoplasm and LEFT  hilar adenopathy. Scattered mucous plugging and mucus within airways in both lungs. Aortic Atherosclerosis (ICD10-I70.0) and Emphysema (ICD10-J43.9). Electronically Signed   By: Lavonia Dana M.D.   On: 03/08/2022 18:29   DG Chest Port 1 View  Result Date: 03/08/2022 CLINICAL DATA:  Shortness of breath.  History of lung cancer. EXAM: PORTABLE CHEST 1 VIEW COMPARISON:  02/02/2022 FINDINGS: Right Port-A-Cath tip at superior caval/atrial junction. Midline trachea. Normal heart size. Atherosclerosis in the transverse aorta. The costophrenic angles are minimally excluded. Hyperinflation and interstitial thickening. No lobar consolidation. IMPRESSION: COPD/chronic bronchitis. No acute superimposed process. Aortic Atherosclerosis (ICD10-I70.0).  Electronically Signed   By: Abigail Miyamoto M.D.   On: 03/08/2022 16:26    Procedures Procedures    Medications Ordered in ED Medications  amLODipine (NORVASC) tablet 5 mg (has no administration in time range)  ALPRAZolam (XANAX) tablet 0.5-1 mg (has no administration in time range)  magnesium oxide (MAG-OX) tablet 400 mg (has no administration in time range)  sucralfate (CARAFATE) tablet 1 g (has no administration in time range)  folic acid (FOLVITE) tablet 400 mcg (has no administration in time range)  vitamin B-12 (CYANOCOBALAMIN) tablet 1,000 mcg (has no administration in time range)  Calcium Carb-Cholecalciferol 500-10 MG-MCG TABS 1 tablet (has no administration in time range)  cholecalciferol (VITAMIN D) tablet 1,000 Units (has no administration in time range)  Budeson-Glycopyrrol-Formoterol 160-9-4.8 MCG/ACT AERO 2 puff (has no administration in time range)  fluticasone (FLONASE) 50 MCG/ACT nasal spray 2 spray (has no administration in time range)  guaiFENesin (MUCINEX) 12 hr tablet 600 mg (has no administration in time range)  predniSONE (DELTASONE) tablet 40 mg (has no administration in time range)  ipratropium-albuterol (DUONEB) 0.5-2.5 (3) MG/3ML  nebulizer solution 3 mL (has no administration in time range)  albuterol (PROVENTIL) (2.5 MG/3ML) 0.083% nebulizer solution 2.5 mg (has no administration in time range)  enoxaparin (LOVENOX) injection 40 mg (has no administration in time range)  sodium chloride flush (NS) 0.9 % injection 3 mL (has no administration in time range)  0.9 %  sodium chloride infusion (has no administration in time range)  acetaminophen (TYLENOL) tablet 650 mg (has no administration in time range)    Or  acetaminophen (TYLENOL) suppository 650 mg (has no administration in time range)  polyethylene glycol (MIRALAX / GLYCOLAX) packet 17 g (has no administration in time range)  albuterol (PROVENTIL) (2.5 MG/3ML) 0.083% nebulizer solution 5 mg (5 mg Nebulization Given 03/08/22 1601)  magnesium sulfate IVPB 2 g 50 mL (0 g Intravenous Stopped 03/08/22 1724)  sodium chloride 0.9 % bolus 500 mL (500 mLs Intravenous New Bag/Given 03/08/22 1744)  iohexol (OMNIPAQUE) 350 MG/ML injection 75 mL (75 mLs Intravenous Contrast Given 03/08/22 1754)    ED Course/ Medical Decision Making/ A&P                           Medical Decision Making Problems Addressed: Acute on chronic respiratory failure with hypoxia [facility]): acute illness or injury that poses a threat to life or bodily functions COPD exacerbation ([place]): acute illness or injury that poses a threat to life or bodily functions  Amount and/or Complexity of Data Reviewed External Data Reviewed: labs and notes. Labs: ordered. Decision-making details documented in ED Course. Radiology: ordered and independent interpretation performed. Decision-making details documented in ED Course. ECG/medicine tests: ordered and independent interpretation performed. Decision-making details documented in ED Course.  Risk Prescription drug management. Decision regarding hospitalization.  Critical Care Total time providing critical care: 70 minutes  Patient is a 66 year old male who has a  history of recently diagnosed lung cancer and COPD.  He comes in with shortness of breath and respiratory distress.  He was hypoxic on EMS arrival.  He was hypoxic in the ED and was placed back on oxygen by nasal cannula.  He had been given nebulizer treatments by EMS as well as Solu-Medrol.  He was continued on albuterol nebulizers in the ED and was given magnesium sulfate.  Chest x-ray showed no evidence of pneumonia.  This was interpreted by me.  His labs show a  slight hyponatremia.  He has some anemia which is similar to recent prior values.  His troponin is negative.  No ischemic changes on EKG.  He is noted to be tachycardic and tachypneic although this improved on treatment in the ED.  He was also hydrated with normal saline.  He is still a bit tachycardic and tachypneic but has improved greatly with less increased work of breathing.  CT scan of the chest was performed which shows no evidence of PE.  I spoke with Dr. Trilby Drummer with the hospitalist service to admit the patient for further treatment.  Final Clinical Impression(s) / ED Diagnoses Final diagnoses:  COPD exacerbation ([place])  Acute on chronic respiratory failure with hypoxia ([place])  Hyponatremia    Rx / DC Orders ED Discharge Orders     None         Malvin Johns, MD 03/08/22 1906

## 2022-03-08 NOTE — ED Triage Notes (Signed)
Pt BIBA Per EMS: Pt coming from home c/o [facility] since Friday. Pt reports hx lung cancer. O2 86% room air. 10 albuterol, 1mg  Atrovent, 125 solumedrol given en route. A&Ox4  ?

## 2022-03-08 NOTE — H&P (Addendum)
History and Physical   Mark Yu WUJ:[id number] DOB: [birth year] DOA: 03/08/2022  PCP: Penelope Galas, MD   Patient coming from: Home  Chief Complaint: Shortness of breath  HPI: Mark Yu is a 66 y.o. male with medical history significant of lung cancer recently diagnosed, COPD, anxiety, hypertension, depression presenting with ongoing shortness of breath.  Patient has had 2 days of ongoing shortness of breath.  Now getting significant short of breath with minimal exertion.  Was recently diagnosed with lung cancer and started chemo and radiation and last month.  He did receive his most recent radiation treatment earlier in the day when symptoms started.  He also had just finished a taper of steroids that he had been prescribed for appetite stimulation.  He reports significant decreased p.o. in the last day or so. EMS was called and patient received nebulizers and Solu-Medrol in route with some temporary improvement of his symptoms that did return after while in the ED.  He denies fevers, chills, chest pain, abdominal pain, constipation, diarrhea, nausea, vomiting.  ED Course: Vital signs in the ED significant for blood pressure in the 120s to 140s systolic, heart rate in the 110s to 130s, respiratory rate in the 20s to 30s, requiring 3 L to maintain saturations, initially saturating in the 80s on room air.  Lab work-up included BMP which showed sodium of 129, chloride 94, BUN 24, glucose 118.  Calcium 8.4.  CBC with hemoglobin stable at 9.9.  BMP normal.  Troponin normal.  D-dimer mildly elevated at 1.13.  Respiratory panel for flu COVID-negative.  Chest x-ray showing COPD and bronchitis with no acute abnormality.  CTA PE study was negative for PE or acute abnormalities did show chronic bullous COPD and stable right nodule that was not active on previous PET and decrease in left nodule and lymphadenopathy, mucous plugging also noted.  Review of Systems: As per HPI otherwise all other  systems reviewed and are negative.  Past Medical History:  Diagnosis Date   Anxiety    tx xanax   COPD (chronic obstructive pulmonary disease) (HCC)    tx inhalers   Depression    Dyspnea    with exertion   HTN (hypertension)    tx amlodipine    Past Surgical History:  Procedure Laterality Date   BRONCHIAL BIOPSY  02/02/2022   Procedure: BRONCHIAL BIOPSIES;  Surgeon: Leslye Peer, MD;  Location: [facility] ENDOSCOPY;  Service: Pulmonary;;   BRONCHIAL BRUSHINGS  02/02/2022   Procedure: BRONCHIAL BRUSHINGS;  Surgeon: Leslye Peer, MD;  Location: [facility] ENDOSCOPY;  Service: Pulmonary;;   BRONCHIAL NEEDLE ASPIRATION BIOPSY  02/02/2022   Procedure: BRONCHIAL NEEDLE ASPIRATION BIOPSIES;  Surgeon: Leslye Peer, MD;  Location: [facility] ENDOSCOPY;  Service: Pulmonary;;   IR IMAGING GUIDED PORT INSERTION  02/24/2022   MRI     NO PAST SURGERIES     VIDEO BRONCHOSCOPY WITH ENDOBRONCHIAL ULTRASOUND N/A 02/02/2022   Procedure: VIDEO BRONCHOSCOPY WITH ENDOBRONCHIAL ULTRASOUND;  Surgeon: Leslye Peer, MD;  Location: [facility] ENDOSCOPY;  Service: Pulmonary;  Laterality: N/A;   VIDEO BRONCHOSCOPY WITH RADIAL ENDOBRONCHIAL ULTRASOUND  02/02/2022   Procedure: VIDEO BRONCHOSCOPY WITH RADIAL ENDOBRONCHIAL ULTRASOUND;  Surgeon: Leslye Peer, MD;  Location: [facility] ENDOSCOPY;  Service: Pulmonary;;    Social History  reports that he has been smoking cigarettes. He has a 12.50 pack-year smoking history. He does not have any smokeless tobacco history on file. He reports current alcohol use of about 30.0 standard drinks per week. He reports current  drug use. Drug: Marijuana.  Allergies  Allergen Reactions   Amoxicillin Rash    Family History  Problem Relation Age of Onset   Alzheimer's disease Mother    Multiple sclerosis Father   Reviewed on admission  Prior to Admission medications   Medication Sig Start Date End Date Taking? Authorizing Provider  acetaminophen (TYLENOL) 500 MG tablet Take 500 mg by mouth every 8  (eight) hours as needed for moderate pain or headache.   Yes [provider]  albuterol (PROVENTIL) (2.5 MG/3ML) 0.083% nebulizer solution Take 3 mLs (2.5 mg total) by nebulization every 6 (six) hours as needed for wheezing or shortness of breath. 02/20/22  Yes Glenford Bayley, NP  albuterol (VENTOLIN HFA) 108 (90 Base) MCG/ACT inhaler Inhale 2 puffs into the lungs every 4 (four) hours as needed for wheezing or shortness of breath.   Yes [provider]  ALPRAZolam Prudy Feeler) 1 MG tablet Take 0.5-1 tablets (0.5-1 mg total) by mouth 2 (two) times daily as needed for anxiety. 02/20/22  Yes Glenford Bayley, NP  amLODipine (NORVASC) 5 MG tablet Take 5 mg by mouth daily. 12/28/21  Yes [provider]  BREZTRI AEROSPHERE 160-9-4.8 MCG/ACT AERO Inhale 2 puffs into the lungs in the morning and at bedtime. 01/02/22  Yes [provider]  Calcium Carb-Cholecalciferol 500-10 MG-MCG TABS Take 1 tablet by mouth daily.   Yes [provider]  cholecalciferol (VITAMIN D) 25 MCG (1000 UNIT) tablet Take 1,000 Units by mouth daily.   Yes [provider]  fluticasone (FLONASE) 50 MCG/ACT nasal spray Place 2 sprays into both nostrils daily as needed for allergies or rhinitis.   Yes [provider]  folic acid (FOLVITE) 400 MCG tablet Take 400 mcg by mouth daily.   Yes [provider]  guaiFENesin (MUCINEX) 600 MG 12 hr tablet Take 600 mg by mouth every evening.   Yes [provider]  magnesium oxide (MAG-OX) 400 MG tablet Take 400 mg by mouth daily.   Yes [provider]  prochlorperazine (COMPAZINE) 10 MG tablet Take 1 tablet (10 mg total) by mouth every 6 (six) hours as needed for nausea or vomiting. 02/09/22  Yes Si Gaul, MD  sucralfate (CARAFATE) 1 g tablet Take 1 tablet (1 g total) by mouth 4 (four) times daily. Dissolve each tablet in 15 cc water before use. 03/06/22  Yes Dorothy Puffer, MD  vitamin B-12 (CYANOCOBALAMIN) 1000  MCG tablet Take 1,000 mcg by mouth daily.   Yes [provider]  lidocaine-prilocaine (EMLA) cream Apply to the Port-A-Cath site 30-60 minutes before chemotherapy 02/09/22   Si Gaul, MD  methylPREDNISolone (MEDROL DOSEPAK) 4 MG TBPK tablet Use as instructed. 02/25/22   Si Gaul, MD    Physical Exam: Vitals:   03/08/22 1730 03/08/22 1807 03/08/22 1830 03/08/22 1909  BP: (!) 142/79 133/69 124/81   Pulse: (!) 122 (!) 121 (!) 112   Resp: (!) 34 14 (!) 28   Temp:      TempSrc:      SpO2: 93% 100% 98%   Weight:    49 kg  Height:    5\' 11"  (1.803 m)    Physical Exam Constitutional:      General: He is not in acute distress.    Appearance: Normal appearance.     Comments: Thin elderly male  HENT:     Head: Normocephalic and atraumatic.     Mouth/Throat:     Mouth: Mucous membranes are moist.  Pharynx: Oropharynx is clear.  Eyes:     Extraocular Movements: Extraocular movements intact.     Pupils: Pupils are equal, round, and reactive to light.  Cardiovascular:     Rate and Rhythm: Regular rhythm. Tachycardia present.     Pulses: Normal pulses.     Heart sounds: Normal heart sounds.     Comments: Port in place. Pulmonary:     Effort: Pulmonary effort is normal. No respiratory distress.     Breath sounds: Wheezing present.  Abdominal:     General: Bowel sounds are normal. There is no distension.     Palpations: Abdomen is soft.     Tenderness: There is no abdominal tenderness.  Musculoskeletal:        General: No swelling or deformity.  Skin:    General: Skin is warm and dry.  Neurological:     General: No focal deficit present.     Mental Status: Mental status is at baseline.   Labs on Admission: I have personally reviewed following labs and imaging studies  CBC: Recent Labs  Lab 03/02/22 1309 03/08/22 1559  WBC 1.6* 5.4  NEUTROABS 0.7* 3.5  HGB 9.2* 9.9*  HCT 26.9* 30.8*  MCV 89.1 93.6  PLT 59* 220    Basic Metabolic Panel: Recent  Labs  Lab 03/02/22 1309 03/08/22 1559  NA 139 129*  K 3.5 3.7  CL 103 94*  CO2 28 25  GLUCOSE 87 118*  BUN 9 24*  CREATININE 0.53* 0.63  CALCIUM 9.1 8.4*  MG 2.0  --     GFR: Estimated Creatinine Clearance: 63.8 mL/min (by C-G formula based on SCr of 0.63 mg/dL).  Liver Function Tests: Recent Labs  Lab 03/02/22 1309  AST 12*  ALT 13  ALKPHOS 41  BILITOT 0.4  PROT 6.6  ALBUMIN 4.1    Urine analysis: No results found for: COLORURINE, APPEARANCEUR, LABSPEC, PHURINE, GLUCOSEU, HGBUR, BILIRUBINUR, KETONESUR, PROTEINUR, UROBILINOGEN, NITRITE, LEUKOCYTESUR  Radiological Exams on Admission: CT Angio Chest PE W/Cm &/Or Wo Cm  Result Date: 03/08/2022 CLINICAL DATA:  Shortness of breath, elevated D-dimer question pulmonary embolism, history of LEFT lung cancer EXAM: CT ANGIOGRAPHY CHEST WITH CONTRAST TECHNIQUE: Multidetector CT imaging of the chest was performed using the standard protocol during bolus administration of intravenous contrast. Multiplanar CT image reconstructions and MIPs were obtained to evaluate the vascular anatomy. RADIATION DOSE REDUCTION: This exam was performed according to the departmental dose-optimization program which includes automated exposure control, adjustment of the mA and/or kV according to patient size and/or use of iterative reconstruction technique. CONTRAST:  75mL OMNIPAQUE IOHEXOL 350 MG/ML SOLN IV COMPARISON:  01/06/2022 FINDINGS: Cardiovascular: Atherosclerotic calcifications aorta, proximal great vessels, and coronary arteries. Heart unremarkable. No pericardial effusion. Pulmonary arteries well opacified and patent. No evidence of pulmonary embolism. Mediastinum/Nodes: Esophagus unremarkable. Base of cervical region normal appearance. No thoracic adenopathy. Previously identified enlarged LEFT hilar lymph node is now normal in size at 9 mm short axis. Lungs/Pleura: Emphysematous changes with bullous disease at apices. Anterior RIGHT upper lobe mass,  partially calcified, 9 x 8 mm unchanged question granuloma. Few additional scattered calcified granulomata. Central peribronchial thickening. Again identified LEFT upper lobe opacity 16 x 10 mm previously 22 x 13 mm, no neoplasm. Scattered mucus within the bronchus intermedius and RIGHT lower lobe bronchus as well as scattered LEFT lung bronchi. No acute infiltrate, pleural effusion, or pneumothorax. Upper Abdomen: Visualized upper abdomen unremarkable Musculoskeletal: Diffuse osseous demineralization. No acute osseous findings. Review of the MIP images confirms  the above findings. IMPRESSION: No evidence of pulmonary embolism. No acute intrathoracic abnormalities. Scattered atherosclerotic calcifications including coronary arteries. 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 Interval decrease in size of LEFT upper lobe neoplasm and LEFT hilar adenopathy. Scattered mucous plugging and mucus within airways in both lungs. Aortic Atherosclerosis (ICD10-I70.0) and Emphysema (ICD10-J43.9). Electronically Signed   By: Ulyses Southward M.D.   On: 03/08/2022 18:29   DG Chest Port 1 View  Result Date: 03/08/2022 CLINICAL DATA:  Shortness of breath.  History of lung cancer. EXAM: PORTABLE CHEST 1 VIEW COMPARISON:  02/02/2022 FINDINGS: Right Port-A-Cath tip at superior caval/atrial junction. Midline trachea. Normal heart size. Atherosclerosis in the transverse aorta. The costophrenic angles are minimally excluded. Hyperinflation and interstitial thickening. No lobar consolidation. IMPRESSION: COPD/chronic bronchitis. No acute superimposed process. Aortic Atherosclerosis (ICD10-I70.0). Electronically Signed   By: Jeronimo Greaves M.D.   On: 03/08/2022 16:26    EKG: Independently reviewed.  Sinus tachycardia at 133 bpm.  Some baseline artifact in multiple leads.  Similar to previous.  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 > Found to be hypoxic on room air in the ED.  Not on any chronic oxygen at home. > Received Solu-Medrol in route by EMS as well as nebulizers.  Received additional albuterol, magnesium and IV fluids in the ED. > As below recently diagnosed with lung cancer and has started treatment. > Unclear trigger, negative for flu and COVID in the ED.  We will check full respiratory viral panel. > CT scan did also note mucous plugging. Wheezing on exam. - Monitor on telemetry - Continue supplemental oxygen wean as tolerated - Continue daily steroids - Scheduled DuoNebs every 6 hours - As needed albuterol nebs - Continue home Breztri - Check respiratory viral panel - Twice daily Mucinex - Pulmonary toilet with flutter valve and incentive spirometry  Lung cancer > Recently diagnosed with small cell lung cancer.  Left upper lobe nodule and lymphadenopathy noted. > Started chemotherapy and radiation therapy last month. > Current regimen is cisplatin, etoposide > Was also recently prescribed steroids for suppressed appetite which she has finished. > CT scan noted some reduction in left upper lobe nodule and lymphadenopathy. - Will add his oncologist provider to treatment team to alert them of his admission - Continue to monitor  Hyponatremia > Mild hyponatremia at 129 in the ED.  In setting of decreased p.o. intake. - Has received some IV fluids in the ED, will continue gentle IV fluids overnight - Trend sodium  Hypertension - Continue home amlodipine  Anxiety - Continue home as needed Xanax  DVT prophylaxis: Lovenox Code Status:   Full Family Communication:  None on admission, he  states he has kept his significant other up-to-date  disposition Plan:   Patient is from:  Home  Anticipated DC to:  Home  Anticipated DC date:  1 to 2 days  Anticipated DC barriers: None  Consults called:  None Admission status:  Observation, telemetry  Severity of Illness: The appropriate patient status for this patient is OBSERVATION. Observation status is judged to be reasonable and necessary  in order to provide the required intensity of service to ensure the patient's safety. The patient's presenting symptoms, physical exam findings, and initial radiographic and laboratory data in the context of their medical condition is felt to place them at decreased risk for further clinical deterioration. Furthermore, it is anticipated that the patient will be medically stable for discharge from the hospital within 2 midnights of admission.    Synetta Fail MD [facility]  How to contact the [facility] Attending or Consulting provider 7A - 7P or covering provider during after hours 7P -7A, for this patient?   Check the care team in [facility] and look for a) attending/consulting TRH provider listed and b) the [facility] team listed Log into [email or web address] and use [place]'s universal password to access. If you do not have the password, please contact the hospital operator. Locate the [facility] provider you are looking for under [facility] and page to a number that you can be directly reached. If you still have difficulty reaching the provider, please page the [facility] (Director on Call) for the Hospitalists listed on amion for assistance.  03/08/2022, 8:05 PM

## 2022-03-09 ENCOUNTER — Inpatient Hospital Stay: Payer: Medicare Other

## 2022-03-09 ENCOUNTER — Inpatient Hospital Stay: Payer: Medicare Other | Admitting: Physician Assistant

## 2022-03-09 ENCOUNTER — Telehealth: Payer: Self-pay | Admitting: Medical Oncology

## 2022-03-09 ENCOUNTER — Inpatient Hospital Stay (HOSPITAL_COMMUNITY): Payer: Medicare Other

## 2022-03-09 ENCOUNTER — Ambulatory Visit
Admission: RE | Admit: 2022-03-09 | Discharge: 2022-03-09 | Disposition: A | Discharge status: Home / Self Care | Payer: Medicare Other | Source: Ambulatory Visit | Attending: Radiation Oncology | Admitting: Radiation Oncology

## 2022-03-09 DIAGNOSIS — J9601 Acute respiratory failure with hypoxia: Secondary | ICD-10-CM | POA: Diagnosis not present

## 2022-03-09 DIAGNOSIS — E871 Hypo-osmolality and hyponatremia: Secondary | ICD-10-CM | POA: Diagnosis present

## 2022-03-09 DIAGNOSIS — J129 Viral pneumonia, unspecified: Secondary | ICD-10-CM | POA: Diagnosis not present

## 2022-03-09 DIAGNOSIS — J441 Chronic obstructive pulmonary disease with (acute) exacerbation: Secondary | ICD-10-CM | POA: Diagnosis not present

## 2022-03-09 DIAGNOSIS — Y92239 Unspecified place in hospital as the place of occurrence of the external cause: Secondary | ICD-10-CM | POA: Diagnosis not present

## 2022-03-09 DIAGNOSIS — Z20822 Contact with and (suspected) exposure to covid-19: Secondary | ICD-10-CM | POA: Diagnosis present

## 2022-03-09 DIAGNOSIS — I10 Essential (primary) hypertension: Secondary | ICD-10-CM | POA: Diagnosis present

## 2022-03-09 DIAGNOSIS — F419 Anxiety disorder, unspecified: Secondary | ICD-10-CM | POA: Diagnosis not present

## 2022-03-09 DIAGNOSIS — J432 Centrilobular emphysema: Secondary | ICD-10-CM | POA: Diagnosis present

## 2022-03-09 DIAGNOSIS — D630 Anemia in neoplastic disease: Secondary | ICD-10-CM | POA: Diagnosis present

## 2022-03-09 DIAGNOSIS — R5381 Other malaise: Secondary | ICD-10-CM | POA: Diagnosis not present

## 2022-03-09 DIAGNOSIS — C3412 Malignant neoplasm of upper lobe, left bronchus or lung: Secondary | ICD-10-CM | POA: Diagnosis present

## 2022-03-09 DIAGNOSIS — D6481 Anemia due to antineoplastic chemotherapy: Secondary | ICD-10-CM | POA: Diagnosis present

## 2022-03-09 DIAGNOSIS — I471 Supraventricular tachycardia: Secondary | ICD-10-CM | POA: Diagnosis not present

## 2022-03-09 DIAGNOSIS — Z515 Encounter for palliative care: Secondary | ICD-10-CM | POA: Diagnosis not present

## 2022-03-09 DIAGNOSIS — J9621 Acute and chronic respiratory failure with hypoxia: Secondary | ICD-10-CM | POA: Diagnosis present

## 2022-03-09 DIAGNOSIS — J1289 Other viral pneumonia: Secondary | ICD-10-CM | POA: Diagnosis present

## 2022-03-09 DIAGNOSIS — T451X5A Adverse effect of antineoplastic and immunosuppressive drugs, initial encounter: Secondary | ICD-10-CM

## 2022-03-09 DIAGNOSIS — J690 Pneumonitis due to inhalation of food and vomit: Secondary | ICD-10-CM | POA: Diagnosis not present

## 2022-03-09 DIAGNOSIS — J9600 Acute respiratory failure, unspecified whether with hypoxia or hypercapnia: Secondary | ICD-10-CM | POA: Diagnosis not present

## 2022-03-09 DIAGNOSIS — R918 Other nonspecific abnormal finding of lung field: Secondary | ICD-10-CM | POA: Diagnosis not present

## 2022-03-09 DIAGNOSIS — Z681 Body mass index (BMI) 19 or less, adult: Secondary | ICD-10-CM | POA: Diagnosis not present

## 2022-03-09 DIAGNOSIS — G9341 Metabolic encephalopathy: Secondary | ICD-10-CM | POA: Diagnosis not present

## 2022-03-09 DIAGNOSIS — F32A Depression, unspecified: Secondary | ICD-10-CM | POA: Diagnosis present

## 2022-03-09 DIAGNOSIS — R64 Cachexia: Secondary | ICD-10-CM | POA: Diagnosis present

## 2022-03-09 DIAGNOSIS — Z930 Tracheostomy status: Secondary | ICD-10-CM | POA: Diagnosis not present

## 2022-03-09 DIAGNOSIS — C779 Secondary and unspecified malignant neoplasm of lymph node, unspecified: Secondary | ICD-10-CM

## 2022-03-09 DIAGNOSIS — E43 Unspecified severe protein-calorie malnutrition: Secondary | ICD-10-CM | POA: Diagnosis present

## 2022-03-09 DIAGNOSIS — J449 Chronic obstructive pulmonary disease, unspecified: Secondary | ICD-10-CM | POA: Diagnosis not present

## 2022-03-09 DIAGNOSIS — F17210 Nicotine dependence, cigarettes, uncomplicated: Secondary | ICD-10-CM | POA: Diagnosis present

## 2022-03-09 DIAGNOSIS — Z4659 Encounter for fitting and adjustment of other gastrointestinal appliance and device: Secondary | ICD-10-CM | POA: Diagnosis not present

## 2022-03-09 DIAGNOSIS — J9622 Acute and chronic respiratory failure with hypercapnia: Secondary | ICD-10-CM | POA: Diagnosis present

## 2022-03-09 DIAGNOSIS — R1314 Dysphagia, pharyngoesophageal phase: Secondary | ICD-10-CM | POA: Diagnosis not present

## 2022-03-09 DIAGNOSIS — C3490 Malignant neoplasm of unspecified part of unspecified bronchus or lung: Secondary | ICD-10-CM | POA: Diagnosis not present

## 2022-03-09 DIAGNOSIS — L89890 Pressure ulcer of other site, unstageable: Secondary | ICD-10-CM | POA: Diagnosis not present

## 2022-03-09 DIAGNOSIS — Z7189 Other specified counseling: Secondary | ICD-10-CM | POA: Diagnosis not present

## 2022-03-09 DIAGNOSIS — R1313 Dysphagia, pharyngeal phase: Secondary | ICD-10-CM | POA: Diagnosis not present

## 2022-03-09 DIAGNOSIS — B348 Other viral infections of unspecified site: Secondary | ICD-10-CM | POA: Diagnosis not present

## 2022-03-09 DIAGNOSIS — J9501 Hemorrhage from tracheostomy stoma: Secondary | ICD-10-CM | POA: Diagnosis not present

## 2022-03-09 LAB — CBC
HCT: 26.2 % — ABNORMAL LOW (ref 39.0–52.0)
HCT: 28.6 % — ABNORMAL LOW (ref 39.0–52.0)
Hemoglobin: 8.7 g/dL — ABNORMAL LOW (ref 13.0–17.0)
Hemoglobin: 9.2 g/dL — ABNORMAL LOW (ref 13.0–17.0)
MCH: 31.1 pg (ref 26.0–34.0)
MCH: 30.4 pg (ref 26.0–34.0)
MCHC: 33.2 g/dL (ref 30.0–36.0)
MCHC: 32.2 g/dL (ref 30.0–36.0)
MCV: 93.6 fL (ref 80.0–100.0)
MCV: 94.4 fL (ref 80.0–100.0)
Platelets: 203 10*3/uL (ref 150–400)
Platelets: 346 10*3/uL (ref 150–400)
RBC: 2.8 MIL/uL — ABNORMAL LOW (ref 4.22–5.81)
RBC: 3.03 MIL/uL — ABNORMAL LOW (ref 4.22–5.81)
RDW: 18.9 % — ABNORMAL HIGH (ref 11.5–15.5)
RDW: 18.6 % — ABNORMAL HIGH (ref 11.5–15.5)
WBC: 5.3 10*3/uL (ref 4.0–10.5)
WBC: 11.2 10*3/uL — ABNORMAL HIGH (ref 4.0–10.5)
nRBC: 0 % (ref 0.0–0.2)
nRBC: 0 % (ref 0.0–0.2)

## 2022-03-09 LAB — RESPIRATORY PANEL BY PCR

## 2022-03-09 LAB — BLOOD GAS, ARTERIAL
Acid-Base Excess: 1.5 mmol/L (ref 0.0–2.0)
Acid-base deficit: 4.6 mmol/L — ABNORMAL HIGH (ref 0.0–2.0)
Bicarbonate: 24.8 mmol/L (ref 20.0–28.0)
Bicarbonate: 28.5 mmol/L — ABNORMAL HIGH (ref 20.0–28.0)
Delivery systems: POSITIVE
Drawn by: 22052
Drawn by: 56037
Expiratory PAP: 8 cmH2O
FIO2: 36 %
FIO2: 30 %
Inspiratory PAP: 16 cmH2O
O2 Content: 4 L/min
O2 Saturation: 97.3 %
O2 Saturation: 98.9 %
Patient temperature: 37
Patient temperature: 36.4
RATE: 10 resp/min
pCO2 arterial: 65 mmHg — ABNORMAL HIGH (ref 32–48)
pCO2 arterial: 53 mmHg — ABNORMAL HIGH (ref 32–48)
pH, Arterial: 7.19 — CL (ref 7.35–7.45)
pH, Arterial: 7.34 — ABNORMAL LOW (ref 7.35–7.45)
pO2, Arterial: 99 mmHg (ref 83–108)
pO2, Arterial: 95 mmHg (ref 83–108)

## 2022-03-09 LAB — BASIC METABOLIC PANEL
Anion gap: 7 (ref 5–15)
Anion gap: 7 (ref 5–15)
BUN: 16 mg/dL (ref 8–23)
BUN: 16 mg/dL (ref 8–23)
CO2: 26 mmol/L (ref 22–32)
CO2: 28 mmol/L (ref 22–32)
Calcium: 8 mg/dL — ABNORMAL LOW (ref 8.9–10.3)
Calcium: 8.4 mg/dL — ABNORMAL LOW (ref 8.9–10.3)
Chloride: 102 mmol/L (ref 98–111)
Chloride: 100 mmol/L (ref 98–111)
Creatinine, Ser: 0.39 mg/dL — ABNORMAL LOW (ref 0.61–1.24)
Creatinine, Ser: 0.48 mg/dL — ABNORMAL LOW (ref 0.61–1.24)
GFR, Estimated: >60 mL/min (ref >60)
GFR, Estimated: >60 mL/min (ref >60)
Glucose, Bld: 128 mg/dL — ABNORMAL HIGH (ref 70–99)
Glucose, Bld: 168 mg/dL — ABNORMAL HIGH (ref 70–99)
Potassium: 3.8 mmol/L (ref 3.5–5.1)
Potassium: 3.9 mmol/L (ref 3.5–5.1)
Sodium: 135 mmol/L (ref 135–145)
Sodium: 135 mmol/L (ref 135–145)

## 2022-03-09 LAB — HIV ANTIBODY (ROUTINE TESTING W REFLEX): HIV Screen 4th Generation wRfx: NONREACTIVE

## 2022-03-09 MED ORDER — ORAL CARE MOUTH RINSE
15.0000 mL | Freq: Two times a day (BID) | OROMUCOSAL | Status: DC
  Administered 2022-03-10: 15 mL via OROMUCOSAL

## 2022-03-09 MED ORDER — AZITHROMYCIN 250 MG PO TABS
250.0000 mg | ORAL_TABLET | Freq: Every day | ORAL | Status: DC
Start: 2022-03-09 — End: 2022-03-10
  Administered 2022-03-09: 250 mg via ORAL
  Filled 2022-03-09: qty 1

## 2022-03-09 MED ORDER — PANTOPRAZOLE SODIUM 40 MG PO TBEC: 40.0000 mg | DELAYED_RELEASE_TABLET | Freq: Every day | ORAL | Status: DC

## 2022-03-09 MED ORDER — BUDESONIDE 0.25 MG/2ML IN SUSP
0.2500 mg | Freq: Two times a day (BID) | RESPIRATORY_TRACT | Status: DC
  Administered 2022-03-09: 0.25 mg via RESPIRATORY_TRACT
  Filled 2022-03-09 (×2): qty 2

## 2022-03-09 MED ORDER — METHYLPREDNISOLONE SODIUM SUCC 40 MG IJ SOLR
40.0000 mg | Freq: Two times a day (BID) | INTRAMUSCULAR | Status: AC
  Administered 2022-03-09 – 2022-03-11 (×6): 40 mg via INTRAVENOUS
  Filled 2022-03-09 (×6): qty 1

## 2022-03-09 MED ORDER — CHLORHEXIDINE GLUCONATE 0.12 % MT SOLN
15.0000 mL | Freq: Two times a day (BID) | OROMUCOSAL | Status: DC
  Administered 2022-03-09 – 2022-03-15 (×8): 15 mL via OROMUCOSAL
  Filled 2022-03-09 (×9): qty 15

## 2022-03-09 MED ORDER — LORAZEPAM 2 MG/ML IJ SOLN
1.0000 mg | INTRAMUSCULAR | Status: DC | PRN
  Administered 2022-03-09: 1 mg via INTRAVENOUS
  Administered 2022-03-09 – 2022-03-15 (×12): 2 mg via INTRAVENOUS
  Administered 2022-03-15: 1 mg via INTRAVENOUS
  Administered 2022-03-16 – 2022-03-17 (×3): 2 mg via INTRAVENOUS
  Administered 2022-03-18: 1 mg via INTRAVENOUS
  Filled 2022-03-09 (×21): qty 1

## 2022-03-09 MED ORDER — ARFORMOTEROL TARTRATE 15 MCG/2ML IN NEBU
15.0000 ug | INHALATION_SOLUTION | Freq: Two times a day (BID) | RESPIRATORY_TRACT | Status: DC
  Administered 2022-03-09 – 2022-04-17 (×76): 15 ug via RESPIRATORY_TRACT
  Filled 2022-03-09 (×77): qty 2

## 2022-03-09 MED ORDER — CHLORHEXIDINE GLUCONATE CLOTH 2 % EX PADS
6.0000 | MEDICATED_PAD | Freq: Every day | CUTANEOUS | Status: DC
  Administered 2022-03-09 – 2022-04-16 (×26): 6 via TOPICAL

## 2022-03-09 NOTE — Assessment & Plan Note (Addendum)
Still on 4L, gasping for breath just transferring from bed to wheelchair. ? ?This is due to COPD and rhinovirus ?

## 2022-03-09 NOTE — Progress Notes (Signed)
° ° °  OVERNIGHT PROGRESS REPORT   Return bedside visit:  Patient speaks and responds appropriately and readily.Also noted tremor and somewhat anxious with occasional tachycardia and tachypnea (tachypnea above current improved rate on BiPAP in no otherwise noted distress ie no accessory muscle use) During inquiry of ETOH intake/amount/frequency patient responded to the RN "not enough".   In light of current physician notation (03/09/22 0949 hrs " * "  and current findings CIWA is now ordered and initiated.   *     " Anxiety - Continue Xanax - Low threshold to start CIWA protocol "   Gershon Cull MSNA MSN ACNPC-AG Acute Care Nurse Practitioner [place]

## 2022-03-09 NOTE — Progress Notes (Incomplete)
[id number] ?Patient found in bathroom unresponsive and off oxygen, writer picked up patient and transported to be bed and rapid response called, patient stabilized and plan for bipap on patient transported to step down unit  ?

## 2022-03-09 NOTE — Assessment & Plan Note (Signed)
Clinically insignificant ?

## 2022-03-09 NOTE — Assessment & Plan Note (Signed)
-   Continue Xanax ?- Low threshold to start CIWA protocol ?

## 2022-03-09 NOTE — Progress Notes (Signed)
? ? ?  OVERNIGHT PROGRESS REPORT ? ?Currently, patient responds appropriately to questions. ?ABG has resulted as :3 ? ?  ? Latest Reference Range & Units 03/09/22 18:36 03/09/22 19:58  ?FIO2 % 36 30.00  ?O2 Content L/min 4.0   ?Inspiratory PAP cmH2O  16  ?Expiratory PAP cmH2O  8  ?pH, Arterial 7.35 - 7.45  7.19 (LL) 7.34 (L)  ?pCO2 arterial 32 - 48 mmHg 65 (H) 53 (H)  ?pO2, Arterial 83 - 108 mmHg 99 95  ?Acid-Base Excess 0.0 - 2.0 mmol/L  1.5  ?Acid-base deficit 0.0 - 2.0 mmol/L 4.6 (H)   ?Bicarbonate 20.0 - 28.0 mmol/L 24.8 28.5 (H)  ?O2 Saturation % 97.3 98.9  ?Patient temperature  37.0 36.4  ?Collection site   LEFT RADIAL  ?Allens test (pass/fail) PASS  PASS PASS  ?(LL): Data is critically low ?(L): Data is abnormally low ?(H): Data is abnormally high ? ? ?Pt remains on BiPAP at this time (unless further input from CCM)  . FIO2 had been reduced to 30% at time of ABG draw. ?Will follow. ? ? ?Gershon Cull MSNA MSN ACNPC-AG ?Acute Care Nurse Practitioner ?[facility] ?[place] ? ? ? ?

## 2022-03-09 NOTE — ED Provider Notes (Signed)
6:28 PM ?Patient was a medical CODE BLUE alert from his room upstairs and the ED team responded.  When we got to the patient, he had increased work of breathing was getting oxygen via a bag-valve-mask.  Per report from nursing and Dr. Loleta Books, the medicine provider in the room, patient is newly needing oxygen and walk to the bathroom without his oxygen and then was unresponsive, cyanotic, and not breathing.  He continued to have a pulse but was in respiratory failure. ? ?By the time we arrived, patient was still breathing hard but was maintaining his oxygen saturations on nasal cannula oxygen.  He was able to [facility] and tell me his name and that he was otherwise feeling okay.  He is denying any chest pain or headache.   ? ?As the patient is answer questions appropriately, we agreed to hold on intubation at this time.  On my lung auscultation, patient does have tight breath sounds bilaterally but they are symmetric.  No absent sounds. ? ?Admitting team is going to place the patient on BiPAP and do further treatments for his breathing.  They had no other questions or needs of [place] as patient appears to have been stabilized from a respiratory standpoint at this time.   ? ?We returned to the emergency department and told them we would be happy to return if needed. ? ? ?  ?Elga Santy, Gwenyth Allegra, MD ?03/09/22 1830 ? ?

## 2022-03-09 NOTE — TOC Initial Note (Signed)
Transition of Care (TOC) - Initial/Assessment Note  ? ? ?Patient Details  ?Name: Mark Yu ?MRN: [id number] ?Date of Birth: November 23, 1956 ? ?Transition of Care [facility]) CM/SW Contact:    ?Dessa Phi, RN ?Phone Number: ?03/09/2022, 12:51 PM ? ?Clinical Narrative: d/c plan home. Will need to confirm dme company that provides home 02-left vm w/spouse for call back.Will check w/[employer] if provide home 02.                ? ? ?Expected Discharge Plan: Home/Self Care ?Barriers to Discharge: Continued Medical Work up ? ? ?Patient Goals and CMS Choice ?Patient states their goals for this hospitalization and ongoing recovery are:: home ?CMS Medicare.gov Compare Post Acute Care list provided to:: Patient ?Choice offered to / list presented to : Patient ? ?Expected Discharge Plan and Services ?Expected Discharge Plan: Home/Self Care ?  ?Discharge Planning Services: CM Consult ?  ?Living arrangements for the past 2 months: [place] ?                ?  ?  ?  ?  ?  ?  ?  ?  ?  ?  ? ?Prior Living Arrangements/Services ?Living arrangements for the past 2 months: [place] ?Lives with:: Spouse ?Patient language and need for interpreter reviewed:: Yes ?Do you feel safe going back to the place where you live?: Yes      ?Need for Family Participation in Patient Care: Yes (Comment) ?Care giver support system in place?: Yes (comment) ?  ?Criminal Activity/Legal Involvement Pertinent to Current Situation/Hospitalization: No - Comment as needed ? ?Activities of Daily Living ?Home Assistive Devices/Equipment: Nebulizer ?ADL Screening (condition at time of admission) ?Patient's cognitive ability adequate to safely complete daily activities?: Yes ?Is the patient deaf or have difficulty hearing?: No ?Does the patient have difficulty seeing, even when wearing glasses/contacts?: No ?Does the patient have difficulty concentrating, remembering, or making decisions?: No ?Patient able to express need for assistance with ADLs?:  Yes ?Does the patient have difficulty dressing or bathing?: No ?Independently performs ADLs?: No ?Communication: Independent ?Dressing (OT): Independent ?Grooming: Independent ?Feeding: Independent ?Bathing: Needs assistance ?Is this a change from baseline?: Pre-admission baseline ?Toileting: Independent ?In/Out Bed: Independent ?Walks in Home: Independent ?Does the patient have difficulty walking or climbing stairs?: Yes ?Weakness of Legs: Both ?Weakness of Arms/Hands: None ? ?Permission Sought/Granted ?Permission sought to share information with : Case Manager ?Permission granted to share information with : Yes, Verbal Permission Granted ? Share Information with NAME: Case manager ?   ?   ?   ? ?Emotional Assessment ?  ?  ?  ?  ?  ?  ? ?Admission diagnosis:  Hyponatremia [E87.1] ?COPD exacerbation ([place]) [J44.1] ?Acute respiratory failure with hypoxia ([place]) [J96.01] ?Acute on chronic respiratory failure with hypoxia (HCC) [J96.21] ?COPD with acute exacerbation ([place]) [J44.1] ?Patient Active Problem List  ? Diagnosis Date Noted  ? 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 Essential hypertension 03/09/2022  ? 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 Acute respiratory failure with hypoxia ([place]) 03/08/2022  ? Hyponatremia 03/08/2022  ? Port-A-Cath in place 02/25/2022  ? 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 Encounter for antineoplastic chemotherapy 02/09/2022  ? Hilar adenopathy   ? COPD with acute exacerbation ([place]) 01/22/2022  ? Tobacco use 01/22/2022  ? Anxiety 01/22/2022  ? Pulmonary nodules 01/08/2022  ? ?PCP:  [name], MD ?Pharmacy:   ?[facility] [id number] - [place],  [place] [address] ?[place] [address] ?Phone: [phone] Fax: [phone] ? ? ? ? ?Social Determinants of Health (SDOH) Interventions ?  ? ?Readmission Risk Interventions ?No flowsheet data found. ? ? ?

## 2022-03-09 NOTE — Progress Notes (Signed)
eLink Physician-Brief Progress Note ?Patient Name: Mark Yu ?DOB: December 15, 1956 ?MRN: [id number] ? ? ?Date of Service ? 03/09/2022  ?HPI/Events of Note ? Patient admitted with acute hypoxemic respiratory failure in the context of a history of COPD, small cell lung cancer s/p chemotherapy / radiotherapy, he went to the bathroom without his oxygen while on the medical floor and, upon returning experienced near respiratory arrest and altered mental status, triggering a Code Blue call, he was subsequently transferred to the ICU on BIPAP.  ?eICU Interventions ? New Patient Evaluation.  ? ? ? ?  ? ?Frederik Pear ?03/09/2022, 7:42 PM ?

## 2022-03-09 NOTE — Assessment & Plan Note (Signed)
He is going down for radiation and chemotherapy today ?

## 2022-03-09 NOTE — Hospital Course (Addendum)
Mark Yu is a 66 y.o. 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  In thE ER, tachypneic to 20s, having severe SOB, SpO2 86% on room air.     3/12: Started on steroids and admitted for suspected COPD, CTA chest ruled out PE, new infiltrates, Rhinovirus+

## 2022-03-09 NOTE — Progress Notes (Signed)
Responded to Medical Alert. Patient was in a respiratory arrest but did not proceed into a cardiac arrest. According to beside staff assisting with patient, patient had oxygen off via nasal cannula and was agonally breathing when found. Upon arrival, patient was breathing on his own with ambu-bag assistance. Code team along with MD Danford at beside. Orders placed for STAT ABG and STAT transfer to ICU/SD for BIPAP. Assisted transferring patient to 1237 in ICU/SD. Patient placed on BIPAP.  ?

## 2022-03-09 NOTE — Assessment & Plan Note (Signed)
Hemoglobin stable at 8.7 ?- Continue folate and B12 ?

## 2022-03-09 NOTE — Progress Notes (Signed)
?  Progress Note ? ? ?Patient: Mark Yu MIW:[id number] DOB: 07-04-1956 DOA: 03/08/2022     0 ?DOS: the patient was seen and examined on 03/09/2022 ?  ? ? ? ? ?Brief hospital course: ?Mark Yu is a 66 y.o. 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 ? ?In thE ER, tachypneic to 20s, having severe SOB, SpO2 86% on room air.   ? ? ?3/12: Started on steroids and admitted for suspected COPD, CTA chest ruled out PE, new infiltrates, Rhinovirus+ ? ? ? ? ? ?Assessment and Plan: ?* Acute respiratory failure with hypoxia (HCC) ?Still on 4L, gasping for breath just transferring from bed to wheelchair. ? ?This is due to COPD and rhinovirus ? ?COPD with acute exacerbation ([place]) ?Rhinovirus positive. ?Still on 4 L, poor air movement on exam, gasping for air with minimal exertion. ?- Continue steroids, IV twice daily ?- Continue bronchodilators, scheduled and as needed ?- Start azithromycin ?- Continue Flonase ?- Start pantoprazole ?- Continue ICS/LABA/LAMA ?- Continue Mucinex, 600 twice daily ? ? ? ?  ? ?Essential hypertension ?Blood pressure high normal ?- Continue amlodipine ? ?Anemia associated with chemotherapy ?Hemoglobin stable at 8.7 ?- Continue folate and B12 ? ?Hyponatremia ?Clinically insignificant ? ?Anxiety ?- Continue Xanax ?- Low threshold to start CIWA protocol ? ? ? ? ?  ? ?Subjective: Still very out of breath.  Hoping to go home today and go to work tomorrow.  Dry cough, unable to get sputum out, no confusion, fever. ? ?Physical Exam: ?Vitals:  ? 03/09/22 0114 03/09/22 0517 03/09/22 0736 03/09/22 0843  ?BP:  (!) 141/87  133/82  ?Pulse:  98  (!) 103  ?Resp:  16  18  ?Temp:  97.8 ?F (36.6 ?C)  97.6 ?F (36.4 ?C)  ?TempSrc:  Oral  Oral  ?SpO2: 96% 96% 98% 98%  ?Weight:      ?Height:      ? ?Thin adult male, sitting in wheelchair, appears visibly out of breath ?Lung sounds diminished, I do not even hear air movement,  no wheezing.  Lung sounds equal bilaterally.  Using accessory muscles and visibly out of breath. ?Tachycardic, regular, no murmurs, no peripheral edema, no JVD ?Attention normal, affect appropriate, judgment and insight appear normal, moves all extremities with generalized weakness but symmetric strength ? ? ? ? ?Data Reviewed: ?Nursing notes, vital signs reviewed. ?Imaging and labs reviewed, basic metabolic panel notable for mild hyponatremia ?Complete blood count notable for hemoglobin 8.7, white blood cells and platelets normal ?Rhinovirus positive ?Chest shows no pulmonary embolism, no infiltrate ? ? ? ? ? ?Family Communication:   ? ?Disposition: ?Status is: Observation ?The patient will require care spanning > 2 midnights and should be moved to inpatient because: The patient remains on high levels of supplemental oxygen, is visibly short of breath with just transferring from bed to chair, breathing 30 times a minute and using accessory muscles ? ? ? ? ? ? Planned Discharge Destination: Home ? ? ? ? ? ?Author: ?Edwin Dada, MD ?03/09/2022 9:47 AM ? ?For on call review [email or web address].  ?

## 2022-03-09 NOTE — Telephone Encounter (Signed)
Pt asking about CT results done yesterday. ?Does Mark Yu need to see him sooner than next Monday. ? ?

## 2022-03-09 NOTE — Assessment & Plan Note (Signed)
Blood pressure high normal ?- Continue amlodipine ?

## 2022-03-09 NOTE — Telephone Encounter (Signed)
Per Dr. Julien Nordmann , I told pt "that he ..."looked at the scan and his cancer is already getting better.  No need to see him sooner than next week with delay of the treatment and visit and lab since he is already in the hospital.  ?Thank you ". Pt voiced understanding. ?

## 2022-03-09 NOTE — Assessment & Plan Note (Signed)
Rhinovirus positive. ?Still on 4 L, poor air movement on exam, gasping for air with minimal exertion. ?- Continue steroids, IV twice daily ?- Continue bronchodilators, scheduled and as needed ?- Start azithromycin ?- Continue Flonase ?- Start pantoprazole ?- Continue ICS/LABA/LAMA ?- Continue Mucinex, 600 twice daily ? ? ? ?  ?

## 2022-03-09 NOTE — Progress Notes (Signed)
Called to bedside for CODE BLUE. ?Patient had walked to bathroom and back without his oxygen, on return was found immediately by staff, gray and agonal.  Pulses intact.  Placed on O2 and slowly became more responsive. ? ?CODE TEAM responded.  ABG obtained showed pH 7.19, CO2 65.  CXR without new infiltrates.  ECG sinus tach, p-pulmonale.  Placed on BiPAP and transferred to [facility]   ? ?Probably should defer chemo-radiation until his respiratory status improves. ?

## 2022-03-10 ENCOUNTER — Ambulatory Visit: Payer: Medicare Other

## 2022-03-10 ENCOUNTER — Inpatient Hospital Stay: Payer: Medicare Other

## 2022-03-10 ENCOUNTER — Inpatient Hospital Stay (HOSPITAL_COMMUNITY): Payer: Medicare Other

## 2022-03-10 DIAGNOSIS — B348 Other viral infections of unspecified site: Secondary | ICD-10-CM | POA: Diagnosis not present

## 2022-03-10 DIAGNOSIS — D6481 Anemia due to antineoplastic chemotherapy: Secondary | ICD-10-CM | POA: Diagnosis not present

## 2022-03-10 DIAGNOSIS — J441 Chronic obstructive pulmonary disease with (acute) exacerbation: Secondary | ICD-10-CM | POA: Diagnosis not present

## 2022-03-10 DIAGNOSIS — C3490 Malignant neoplasm of unspecified part of unspecified bronchus or lung: Secondary | ICD-10-CM | POA: Diagnosis not present

## 2022-03-10 DIAGNOSIS — J9601 Acute respiratory failure with hypoxia: Secondary | ICD-10-CM | POA: Diagnosis not present

## 2022-03-10 DIAGNOSIS — F419 Anxiety disorder, unspecified: Secondary | ICD-10-CM | POA: Diagnosis not present

## 2022-03-10 DIAGNOSIS — J9622 Acute and chronic respiratory failure with hypercapnia: Secondary | ICD-10-CM

## 2022-03-10 DIAGNOSIS — R64 Cachexia: Secondary | ICD-10-CM

## 2022-03-10 DIAGNOSIS — J9621 Acute and chronic respiratory failure with hypoxia: Secondary | ICD-10-CM

## 2022-03-10 DIAGNOSIS — E43 Unspecified severe protein-calorie malnutrition: Secondary | ICD-10-CM | POA: Insufficient documentation

## 2022-03-10 LAB — BLOOD GAS, ARTERIAL
Acid-Base Excess: 1.9 mmol/L (ref 0.0–2.0)
Bicarbonate: 28.2 mmol/L — ABNORMAL HIGH (ref 20.0–28.0)
Drawn by: 25770
FIO2: 45 %
MECHVT: 620 mL
O2 Saturation: 97 %
PEEP: 5 cmH2O
Patient temperature: 37
RATE: 16 resp/min
pCO2 arterial: 50 mmHg — ABNORMAL HIGH (ref 32–48)
pH, Arterial: 7.36 (ref 7.35–7.45)
pO2, Arterial: 83 mmHg (ref 83–108)

## 2022-03-10 LAB — PHOSPHORUS
Phosphorus: 2.8 mg/dL (ref 2.5–4.6)
Phosphorus: 2.5 mg/dL (ref 2.5–4.6)

## 2022-03-10 LAB — GLUCOSE, CAPILLARY
Glucose-Capillary: 133 mg/dL — ABNORMAL HIGH (ref 70–99)
Glucose-Capillary: 125 mg/dL — ABNORMAL HIGH (ref 70–99)
Glucose-Capillary: 131 mg/dL — ABNORMAL HIGH (ref 70–99)

## 2022-03-10 LAB — CBC
HCT: 24.7 % — ABNORMAL LOW (ref 39.0–52.0)
Hemoglobin: 7.7 g/dL — ABNORMAL LOW (ref 13.0–17.0)
MCH: 30.4 pg (ref 26.0–34.0)
MCHC: 31.2 g/dL (ref 30.0–36.0)
MCV: 97.6 fL (ref 80.0–100.0)
Platelets: 245 10*3/uL (ref 150–400)
RBC: 2.53 MIL/uL — ABNORMAL LOW (ref 4.22–5.81)
RDW: 18.7 % — ABNORMAL HIGH (ref 11.5–15.5)
WBC: 8.8 10*3/uL (ref 4.0–10.5)
nRBC: 0 % (ref 0.0–0.2)

## 2022-03-10 LAB — BASIC METABOLIC PANEL
Anion gap: 9 (ref 5–15)
BUN: 23 mg/dL (ref 8–23)
CO2: 26 mmol/L (ref 22–32)
Calcium: 8 mg/dL — ABNORMAL LOW (ref 8.9–10.3)
Chloride: 105 mmol/L (ref 98–111)
Creatinine, Ser: 0.35 mg/dL — ABNORMAL LOW (ref 0.61–1.24)
GFR, Estimated: >60 mL/min (ref >60)
Glucose, Bld: 110 mg/dL — ABNORMAL HIGH (ref 70–99)
Potassium: 3.8 mmol/L (ref 3.5–5.1)
Sodium: 140 mmol/L (ref 135–145)

## 2022-03-10 LAB — MAGNESIUM
Magnesium: 2.2 mg/dL (ref 1.7–2.4)
Magnesium: 2.3 mg/dL (ref 1.7–2.4)

## 2022-03-10 MED ORDER — MAGNESIUM OXIDE -MG SUPPLEMENT 400 (240 MG) MG PO TABS
400.0000 mg | ORAL_TABLET | Freq: Every day | ORAL | Status: DC
  Administered 2022-03-10 – 2022-04-17 (×38): 400 mg
  Filled 2022-03-10 (×40): qty 1

## 2022-03-10 MED ORDER — PANTOPRAZOLE 2 MG/ML SUSPENSION
40.0000 mg | Freq: Every day | ORAL | Status: DC
  Administered 2022-03-10 – 2022-04-17 (×39): 40 mg
  Filled 2022-03-10 (×42): qty 20

## 2022-03-10 MED ORDER — POLYETHYLENE GLYCOL 3350 17 G PO PACK
17.0000 g | PACK | Freq: Every day | ORAL | Status: DC
  Administered 2022-03-10 – 2022-04-16 (×14): 17 g
  Filled 2022-03-10 (×29): qty 1

## 2022-03-10 MED ORDER — SODIUM CHLORIDE 0.9 % IV SOLN
500.0000 mg | INTRAVENOUS | Status: AC
  Administered 2022-03-10 – 2022-03-13 (×4): 500 mg via INTRAVENOUS
  Filled 2022-03-10 (×4): qty 5

## 2022-03-10 MED ORDER — VITAMIN B-12 1000 MCG PO TABS
1000.0000 ug | ORAL_TABLET | Freq: Every day | ORAL | Status: DC
  Administered 2022-03-10 – 2022-04-17 (×38): 1000 ug
  Filled 2022-03-10 (×39): qty 1

## 2022-03-10 MED ORDER — BUDESONIDE 0.5 MG/2ML IN SUSP
0.5000 mg | Freq: Two times a day (BID) | RESPIRATORY_TRACT | Status: DC
  Administered 2022-03-10 – 2022-04-17 (×74): 0.5 mg via RESPIRATORY_TRACT
  Filled 2022-03-10 (×76): qty 2

## 2022-03-10 MED ORDER — PANTOPRAZOLE SODIUM 40 MG IV SOLR: 40.0000 mg | Freq: Every day | INTRAVENOUS | Status: DC

## 2022-03-10 MED ORDER — ORAL CARE MOUTH RINSE: 15.0000 mL | OROMUCOSAL | Status: DC

## 2022-03-10 MED ORDER — SUCRALFATE 1 G PO TABS: 1.0000 g | ORAL_TABLET | Freq: Four times a day (QID) | ORAL | Status: DC

## 2022-03-10 MED ORDER — LACTATED RINGERS IV BOLUS
1000.0000 mL | Freq: Once | INTRAVENOUS | Status: AC
  Administered 2022-03-10: 1000 mL via INTRAVENOUS

## 2022-03-10 MED ORDER — PROSOURCE TF PO LIQD
45.0000 mL | Freq: Two times a day (BID) | ORAL | Status: DC
  Administered 2022-03-10 – 2022-03-13 (×7): 45 mL
  Filled 2022-03-10 (×7): qty 45

## 2022-03-10 MED ORDER — PHENYLEPHRINE 40 MCG/ML (10ML) SYRINGE FOR IV PUSH (FOR BLOOD PRESSURE SUPPORT)
PREFILLED_SYRINGE | INTRAVENOUS | Status: AC
  Filled 2022-03-10: qty 10

## 2022-03-10 MED ORDER — ETOMIDATE 2 MG/ML IV SOLN
INTRAVENOUS | Status: AC
  Administered 2022-03-10: 10 mg
  Filled 2022-03-10: qty 20

## 2022-03-10 MED ORDER — AMLODIPINE BESYLATE 5 MG PO TABS
5.0000 mg | ORAL_TABLET | Freq: Every day | ORAL | Status: DC
Start: 2022-03-10 — End: 2022-03-17
  Administered 2022-03-12 – 2022-03-16 (×5): 5 mg
  Filled 2022-03-10 (×8): qty 1

## 2022-03-10 MED ORDER — MIDAZOLAM HCL 2 MG/2ML IJ SOLN
INTRAMUSCULAR | Status: AC
  Administered 2022-03-10: 2 mg
  Filled 2022-03-10: qty 2

## 2022-03-10 MED ORDER — FENTANYL CITRATE (PF) 100 MCG/2ML IJ SOLN
25.0000 ug | INTRAMUSCULAR | Status: DC | PRN
  Administered 2022-03-10 – 2022-03-13 (×10): 100 ug via INTRAVENOUS
  Administered 2022-03-13 – 2022-03-16 (×2): 50 ug via INTRAVENOUS
  Administered 2022-03-17 – 2022-03-18 (×2): 100 ug via INTRAVENOUS
  Filled 2022-03-10 (×15): qty 2

## 2022-03-10 MED ORDER — ORAL CARE MOUTH RINSE
15.0000 mL | OROMUCOSAL | Status: DC
  Administered 2022-03-10 – 2022-03-30 (×157): 15 mL via OROMUCOSAL

## 2022-03-10 MED ORDER — FENTANYL CITRATE (PF) 100 MCG/2ML IJ SOLN
INTRAMUSCULAR | Status: AC
  Administered 2022-03-10: 100 ug
  Filled 2022-03-10: qty 2

## 2022-03-10 MED ORDER — CHLORHEXIDINE GLUCONATE 0.12% ORAL RINSE (MEDLINE KIT)
15.0000 mL | Freq: Two times a day (BID) | OROMUCOSAL | Status: DC
  Administered 2022-03-10 – 2022-03-30 (×34): 15 mL via OROMUCOSAL

## 2022-03-10 MED ORDER — SUCCINYLCHOLINE CHLORIDE 200 MG/10ML IV SOSY
PREFILLED_SYRINGE | INTRAVENOUS | Status: AC
  Filled 2022-03-10: qty 10

## 2022-03-10 MED ORDER — VITAL AF 1.2 CAL PO LIQD
1000.0000 mL | ORAL | Status: DC
  Administered 2022-03-10 – 2022-03-12 (×4): 1000 mL

## 2022-03-10 MED ORDER — ROCURONIUM BROMIDE 10 MG/ML (PF) SYRINGE
PREFILLED_SYRINGE | INTRAVENOUS | Status: AC
  Administered 2022-03-10: 70 mg
  Filled 2022-03-10: qty 10

## 2022-03-10 MED ORDER — LACTATED RINGERS IV SOLN: INTRAVENOUS | Status: DC

## 2022-03-10 MED ORDER — VITAL HIGH PROTEIN PO LIQD
1000.0000 mL | ORAL | Status: AC
  Administered 2022-03-10: 1000 mL

## 2022-03-10 MED ORDER — NOREPINEPHRINE 4 MG/250ML-% IV SOLN
0.0000 ug/min | INTRAVENOUS | Status: DC
  Administered 2022-03-10: 2 ug/min via INTRAVENOUS
  Filled 2022-03-10: qty 250

## 2022-03-10 MED ORDER — KETAMINE HCL 50 MG/5ML IJ SOSY
PREFILLED_SYRINGE | INTRAMUSCULAR | Status: AC
  Filled 2022-03-10: qty 5

## 2022-03-10 MED ORDER — DEXMEDETOMIDINE HCL IN NACL 200 MCG/50ML IV SOLN
0.0000 ug/kg/h | INTRAVENOUS | Status: DC
  Administered 2022-03-10: 0.5 ug/kg/h via INTRAVENOUS
  Administered 2022-03-10: 0.6 ug/kg/h via INTRAVENOUS
  Administered 2022-03-10: 0.4 ug/kg/h via INTRAVENOUS
  Administered 2022-03-11 – 2022-03-12 (×7): 0.6 ug/kg/h via INTRAVENOUS
  Administered 2022-03-13: 0.8 ug/kg/h via INTRAVENOUS
  Filled 2022-03-10 (×12): qty 50

## 2022-03-10 MED ORDER — ACETAMINOPHEN 325 MG PO TABS
650.0000 mg | ORAL_TABLET | Freq: Four times a day (QID) | ORAL | Status: DC | PRN
  Administered 2022-03-18 – 2022-04-17 (×45): 650 mg
  Filled 2022-03-10 (×49): qty 2

## 2022-03-10 MED ORDER — ACETAMINOPHEN 650 MG RE SUPP: 650.0000 mg | Freq: Four times a day (QID) | RECTAL | Status: DC | PRN

## 2022-03-10 MED ORDER — DOCUSATE SODIUM 50 MG/5ML PO LIQD
100.0000 mg | Freq: Two times a day (BID) | ORAL | Status: DC
  Administered 2022-03-10 – 2022-03-29 (×25): 100 mg
  Filled 2022-03-10 (×29): qty 10

## 2022-03-10 MED ORDER — REVEFENACIN 175 MCG/3ML IN SOLN
175.0000 ug | Freq: Every day | RESPIRATORY_TRACT | Status: DC
  Administered 2022-03-10 – 2022-04-17 (×36): 175 ug via RESPIRATORY_TRACT
  Filled 2022-03-10 (×39): qty 3

## 2022-03-10 MED ORDER — SODIUM CHLORIDE 0.9 % IV SOLN: INTRAVENOUS | Status: DC | PRN

## 2022-03-10 MED ORDER — NYSTATIN 100000 UNIT/GM EX POWD
Freq: Two times a day (BID) | CUTANEOUS | Status: DC
  Administered 2022-03-15 – 2022-04-12 (×6): 1 via TOPICAL
  Filled 2022-03-10 (×2): qty 15

## 2022-03-10 MED ORDER — FOLIC ACID 1 MG PO TABS
1.0000 mg | ORAL_TABLET | Freq: Every day | ORAL | Status: DC
Start: 2022-03-10 — End: 2022-04-17
  Administered 2022-03-10 – 2022-04-17 (×38): 1 mg
  Filled 2022-03-10 (×39): qty 1

## 2022-03-10 MED ORDER — FENTANYL CITRATE (PF) 100 MCG/2ML IJ SOLN: 25.0000 ug | INTRAMUSCULAR | Status: DC | PRN

## 2022-03-10 MED ORDER — CHLORHEXIDINE GLUCONATE 0.12% ORAL RINSE (MEDLINE KIT): 15.0000 mL | Freq: Two times a day (BID) | OROMUCOSAL | Status: DC

## 2022-03-10 NOTE — Procedures (Signed)
Intubation Procedure Note ? ?Gen Clagg  ?[id number]  ?08/07/1956 ? ?Date:03/10/22  ?Time:9:07 AM  ? ?Provider Performing:Chenel Wernli  ? ? ?Procedure: Intubation (85027) ? ?Indication(s) ?Respiratory Failure ? ?Consent ?Unable to obtain consent due to emergent nature of procedure. ? ? ?Anesthesia ?Etomidate, Versed, Fentanyl, and Rocuronium ? ? ?Time Out ?Verified patient identification, verified procedure, site/side was marked, verified correct patient position, special equipment/implants available, medications/allergies/relevant history reviewed, required imaging and test results available. ? ? ?Sterile Technique ?Usual hand hygeine, masks, and gloves were used ? ? ?Procedure Description ?Patient positioned in bed supine.  Sedation given as noted above.  Patient was intubated with endotracheal tube using Glidescope.  View was Grade 1 full glottis .  Number of attempts was 1.  Colorimetric CO2 detector was consistent with tracheal placement.  Inserted #8 ETT to 24 cm at lip.   ? ? ?Complications/Tolerance ?None; patient tolerated the procedure well. ?Chest X-ray is ordered to verify placement. ? ?Chesley Mires, MD ?[place] ?Pager - [phone] ?03/10/2022, 9:08 AM ? ? ?

## 2022-03-10 NOTE — Progress Notes (Signed)
Our department is aware of the patient's hospitalization, and current status. We would like to resume radiation when critical care feels he is medically stable to leave the floor. 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 He has done well with chemoRT to date, and has received 15 of the 33 planned fractions, and diagnostic imaging is showing a response to therapy.  ? ?I've reached out to critical care and appreciate their input on when we can safely resume radiation.  ? ? ?

## 2022-03-10 NOTE — Consult Note (Addendum)
? ?NAME:  Mark Yu, MRN:  [id number], DOB:  07-Apr-1956, LOS: 1 ?ADMISSION DATE:  03/08/2022, CONSULTATION DATE:  3/14 ?REFERRING MD: Dr. Loleta Books, CHIEF COMPLAINT: SOB  ? ?History of Present Illness:  ?66 y/o M who presented to [facility] ER on 3/12 with reports of shortness of breath.  ? ?The patient reported on admit 48 hours of shortness of breath.  He noted increased dyspnea with exertion.  The patient has known COPD and continues to smoke. He was recently diagnosed with SCLC and started chemo/XRT in 01/2022.  He was recently prescribed steroids for appetite stimulation.  He noted on admit decreased appetite.  In the ER, he was noted to be tachycardic, have a new O2 need, COVID negative and CXR with no acute abnormality.  CTA Chest was negative for PE but showed stable right nodule and decrease in left nodule & lymphadenopathy, mucus plugging noted. He was admitted per [facility] for further care.  The patient was treated with IV steroids, bronchodilators and empiric azithromycin for AECOPD.  The patient went to radiation therapy on 3/13.  After return from radiation, the patient took his oxygen off and went to the restroom unassisted. He was found in the bed by staff agonal, grey and unresponsive. CODE BLUE was called. The patient did not lose pulses.  He was placed on O2 and slowly became more responsive. ABG showed ph of 7.19, CO2 65 reflective of acute hypercarbic respiratory failure.  CXR did not show new infiltrates.  EKG with ST.  He was transferred to SDU and placed on BiPAP.  The patient's mental status returned to baseline and his ABG improved to 7.34, CO2 53.  Overnight, the patient woke and was pulling on BiPAP mask.  He was given ativan over night for agitation.  Am of 3/14, he was lethargic on bipap.  ? ? ?PCCM consulted for evaluation.  ? ?Pertinent  Medical History  ?COPD  ?Depression  ?Anxiety  ?HTN ? ?Significant Hospital Events: ?Including procedures, antibiotic start and stop dates in addition to other  pertinent events   ?3/12 Admit with dyspnea, AECOPD in setting of rhinovirus  ?3/14 PCCM consulted for evaluation of respiratory failure. Intubated for airway protection  ? ?Interim History / Subjective:  ?As above  ? ?Objective   ?Blood pressure 112/69, pulse (!) 102, temperature (!) 97.5 ?F (36.4 ?C), temperature source Axillary, resp. rate (!) 23, height 5\' 11"  (1.803 m), weight 47.9 kg, SpO2 100 %. ?   ?FiO2 (%):  [30 %-32 %] 30 %  ? ?Intake/Output Summary (Last 24 hours) at 03/10/2022 0854 ?Last data filed at 03/10/2022 0500 ?Gross per 24 hour  ?Intake 400 ml  ?Output 555 ml  ?Net -155 ml  ? ?Filed Weights  ? 03/08/22 1909 03/08/22 2126  ?Weight: 49 kg 47.9 kg  ? ? ?Examination: ?General: cachectic adult male lying in bed in NAD on BiPAP  ?HENT: anicteric, bipap mask in place, pupils =/reactive   ?Lungs: non-labored at rest on bipap, crackles & wheezing bilateraly  ?Cardiovascular: s1s2 rrr, no m/r/g ?Abdomen: scaphoid, soft, bsx4 active  ?Extremities: warm/dry, muscle wasting noted  ?Neuro: lethargic, awakens to voice, generalized weakness / moves all extremities ? ?Resolved Hospital Problem list   ? ? ?Assessment & Plan:  ? ?Acute Hypercarbic/Hypoxic Respiratory Failure  ?PE ruled out, new infiltrates suspect viral in setting of rhiniovirus ?-now intubation ?-PRVC 8cc/kg, rate 20 ?-CXR post intubation and in am  ?-ABG 1 hour post intubation and in am  ? ?Acute Exacerbation of  COPD secondary to Rhinovirus  ?-continue solumedrol  ?-azithromycin for 5 days ?-continue brovana, pulmicort, yupelri  ?-droplet precautions with rhinovirus  ? ?Metastatic Small Cell Lung Cancer  ?Initially diagnosed in 01/2022 on cisplatin/etoposide. ?-XRT on hold for now  ? ?Severe Protein Calorie Malnutrition (POA) ?-begin TF  ?-prosource  ? ?HTN  ?-continue amlodipine  ? ?Steroid Induced Hyperglycemia  ?-glucose goal 14-180 while in ICU ?-monitor for now ? ?Anemia  ?Suspect of chronic disease  ?-trend CBC ? ?Best Practice (right click  and "Reselect all SmartList Selections" daily)  ?Diet/type: tubefeeds ?DVT prophylaxis: LMWH ?GI prophylaxis: PPI ?Lines: Central line > port/chronic  ?Foley:  N/A ?Code Status:  full code ?Last date of multidisciplinary goals of care discussion: Plan of care reviewed by Dr. Loleta Books with patients partner.  He reportedly has family out of state.  She is going to try to contact them by using his phone.  The patient told Dr. Loleta Books yesterday (3/13) that he would want intubation if needed in the event of respiratory failure.  ? ?Labs   ?CBC: ?Recent Labs  ?Lab 03/08/22 ?1559 03/09/22 ?0530 03/09/22 ?1945  ?WBC 5.4 5.3 11.2*  ?NEUTROABS 3.5  --   --   ?HGB 9.9* 8.7* 9.2*  ?HCT 30.8* 26.2* 28.6*  ?MCV 93.6 93.6 94.4  ?PLT 220 203 346  ? ? ?Basic Metabolic Panel: ?Recent Labs  ?Lab 03/08/22 ?1559 03/09/22 ?0530 03/09/22 ?1945  ?NA 129* 135 135  ?K 3.7 3.8 3.9  ?CL 94* 102 100  ?CO2 25 26 28   ?GLUCOSE 118* 128* 168*  ?BUN 24* 16 16  ?CREATININE 0.63 0.39* 0.48*  ?CALCIUM 8.4* 8.0* 8.4*  ? ?GFR: ?Estimated Creatinine Clearance: 62.4 mL/min (A) (by C-G formula based on SCr of 0.48 mg/dL (L)). ?Recent Labs  ?Lab 03/08/22 ?1559 03/09/22 ?0530 03/09/22 ?1945  ?WBC 5.4 5.3 11.2*  ? ? ?Liver Function Tests: ?No results for input(s): AST, ALT, ALKPHOS, BILITOT, PROT, ALBUMIN in the last 168 hours. ?No results for input(s): LIPASE, AMYLASE in the last 168 hours. ?No results for input(s): AMMONIA in the last 168 hours. ? ?ABG ?   ?Component Value Date/Time  ? PHART 7.34 (L) 03/09/2022 1958  ? PCO2ART 53 (H) 03/09/2022 1958  ? PO2ART 95 03/09/2022 1958  ? HCO3 28.5 (H) 03/09/2022 1958  ? ACIDBASEDEF 4.6 (H) 03/09/2022 1836  ? O2SAT 98.9 03/09/2022 1958  ?  ? ?Coagulation Profile: ?No results for input(s): INR, PROTIME in the last 168 hours. ? ?Cardiac Enzymes: ?No results for input(s): CKTOTAL, CKMB, CKMBINDEX, TROPONINI in the last 168 hours. ? ?HbA1C: ?No results found for: HGBA1C ? ?CBG: ?No results for input(s): GLUCAP in the  last 168 hours. ? ?Review of Systems:   ?Unable to complete as patient is altered on BiPAP ? ?Past Medical History:  ?He,  has a past medical history of Anxiety, COPD (chronic obstructive pulmonary disease) ([place]), Depression, Dyspnea, and HTN (hypertension).  ? ?Surgical History:  ? ?Past Surgical History:  ?Procedure Laterality Date  ? BRONCHIAL BIOPSY  02/02/2022  ? Procedure: BRONCHIAL BIOPSIES;  Surgeon: Collene Gobble, MD;  Location: [facility] ENDOSCOPY;  Service: Pulmonary;;  ? BRONCHIAL BRUSHINGS  02/02/2022  ? Procedure: BRONCHIAL BRUSHINGS;  Surgeon: Collene Gobble, MD;  Location: [facility] ENDOSCOPY;  Service: Pulmonary;;  ? BRONCHIAL NEEDLE ASPIRATION BIOPSY  02/02/2022  ? Procedure: BRONCHIAL NEEDLE ASPIRATION BIOPSIES;  Surgeon: Collene Gobble, MD;  Location: [facility] ENDOSCOPY;  Service: Pulmonary;;  ? IR IMAGING GUIDED PORT INSERTION  02/24/2022  ?  MRI    ? NO PAST SURGERIES    ? VIDEO BRONCHOSCOPY WITH ENDOBRONCHIAL ULTRASOUND N/A 02/02/2022  ? Procedure: VIDEO BRONCHOSCOPY WITH ENDOBRONCHIAL ULTRASOUND;  Surgeon: Collene Gobble, MD;  Location: [facility] ENDOSCOPY;  Service: Pulmonary;  Laterality: N/A;  ? VIDEO BRONCHOSCOPY WITH RADIAL ENDOBRONCHIAL ULTRASOUND  02/02/2022  ? Procedure: VIDEO BRONCHOSCOPY WITH RADIAL ENDOBRONCHIAL ULTRASOUND;  Surgeon: Collene Gobble, MD;  Location: [facility] ENDOSCOPY;  Service: Pulmonary;;  ?  ? ?Social History:  ? reports that he has been smoking cigarettes. He has a 12.50 pack-year smoking history. He does not have any smokeless tobacco history on file. He reports current alcohol use of about 30.0 standard drinks per week. He reports current drug use. Drug: Marijuana.  ? ?Family History:  ?His family history includes Alzheimer's disease in his mother; Multiple sclerosis in his father.  ? ?Allergies ?Allergies  ?Allergen Reactions  ? Amoxicillin Rash  ?  ? ?Home Medications  ?Prior to Admission medications   ?Medication Sig Start Date End Date Taking? Authorizing Provider  ?acetaminophen (TYLENOL) 500  MG tablet Take 500 mg by mouth every 8 (eight) hours as needed for moderate pain or headache.   Yes [provider]  ?albuterol (PROVENTIL) (2.5 MG/3ML) 0.083% nebulizer solution Take 3 mLs (2.5 m

## 2022-03-10 NOTE — Progress Notes (Signed)
?  Progress Note ? ? ?Patient: Mark Yu FXJ:[id number] DOB: October 25, 1956 DOA: 03/08/2022     1 ?DOS: the patient was seen and examined on 03/10/2022 ?  ? ? ? ? ?Brief hospital course: ?[name] is a 66 y.o. 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 ? ?In thE ER, tachypneic to 20s, having severe SOB, SpO2 86% on room air.   ? ? ?3/12: Started on steroids and admitted for suspected COPD, CTA chest ruled out PE, new infiltrates, Rhinovirus+ **CODE BLUE** in PM after returning from RadTx, walked to bathroom without, found gray and agonal immediately after, bagged and transferred to [facility] on BiPAP ?3/13: Initially improved mentation and ABG on BiPAP but in AM again nearly unresponsive, again tachypneic to 30s, intubated ? ? ? ? ? ?Assessment and Plan: ?* Acute respiratory failure with hypoxia (HCC) ?Not on home O2.  Hypoxic event in evening leading to CODE BLUE and near respiratory arrest, transferred to ICU on BiPAP overnight but appears to have failed this AM, more sluggish and with the sedation from Ativan for EtOH w/d overnight, he looks like he cannot continue BiPAP ?- Consult CCM for ?intubation, appreciate expertise ? ? ? ?COPD with acute exacerbation ([place]) ?Rhinovirus positive. ?-Continue steroids BID IV, bronchodilators, and antibiotics ?-Continue Flonase PPI ?- Defer mucinex, ICS/LABA, LAMA and adjuncts to CCM ? ? ?Suspected alcohol withdrawal ? To me and to night coverage overnight, patient endorsed "need for a drink".  Given agitation overnight, he was started on IV ativan for suspected withdrawal. ? ?Family report he hasn't drunk in "over a week" as far as she is aware and are surprised by the possibility of alcohol withdrawal, as he hasn't had this before. ?- Defer wd treatment to CCM ? ? ?Essential hypertension ?- Continue amlodipine ? ?Anemia associated with chemotherapy ?Hgb stable ?- Continue  folate and B12 ? ? ?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 ([place]) ?  ? ?  ? ? ? ? ?  ? ?Subjective: Sluggish and lethargic.  Breathing tachypneic, weak, no fever. ? ?Physical Exam: ?Vitals:  ? 03/10/22 1200 03/10/22 1215 03/10/22 1230 03/10/22 1245  ?BP: (!) 84/64 (!) 82/61 (!) 81/49 (!) 82/63  ?Pulse: 92 88 80 81  ?Resp: 16 16 16 16   ?Temp:      ?TempSrc:      ?SpO2: 100% 100% 100% 100%  ?Weight:      ?Height:      ? ?Cachectic adult male, appears lethargic ?BiPAP in place, respirations tachypneic, shallow, I do not appreciate wheezing.  Air movement is very poor. ?Tachycardic, regular, I do not appreciate murmurs ?Patient is lethargic, does not respond to commands, stirs sluggishly with sternal rub ? ? ? ? ?Data Reviewed: ?Nursing notes, vital signs reviewed.  ABG reviewed.  Imaging reviewed.  Basic metabolic panel and CBC reviewed. ? ? ? ? ? ? ?Family Communication: His girlfriend of 88 years Lillia Mountain is at the bedside. ? ?Disposition: ?Status is: Inpatient ?Patient remains inpatient appropriate given: He has had respiratory failure and requires intubation ? ? ? ? ? ? Planned Discharge Destination: Home ? ? ? ? ? ?Author: ?Edwin Dada, MD ?03/10/2022 1:10 PM ? ?For on call review [email or web address].  ?

## 2022-03-10 NOTE — TOC Progression Note (Signed)
Transition of Care (TOC) - Progression Note  ? ? ?Patient Details  ?Name: Auther Lyerly ?MRN: [id number] ?Date of Birth: 1956-01-04 ? ?Transition of Care (TOC) CM/SW Contact  ?Dessa Phi, RN ?Phone Number: ?03/10/2022, 10:08 AM ? ?Clinical Narrative:  Checked w/dme companies on providing home 02 These are not providing home 02 for patient-[employer];will now check with [employer]-TOC to follow. ? ? ? ?Expected Discharge Plan: Home/Self Care ?Barriers to Discharge: Continued Medical Work up ? ?Expected Discharge Plan and Services ?Expected Discharge Plan: Home/Self Care ?  ?Discharge Planning Services: CM Consult ?  ?Living arrangements for the past 2 months: [place] ?                ?  ?  ?  ?  ?  ?  ?  ?  ?  ?  ? ? ?Social Determinants of Health (SDOH) Interventions ?  ? ?Readmission Risk Interventions ?Readmission Risk Prevention Plan 03/09/2022  ?Transportation Screening Complete  ?PCP or Specialist Appt within 3-5 Days Complete  ?[place] or Home Care Consult Complete  ?Social Work Consult for [place] Planning/Counseling Complete  ?Medication Review Press photographer) Complete  ? ? ?

## 2022-03-10 NOTE — Progress Notes (Signed)
Notified Lab that ABG being sent for analysis. 

## 2022-03-10 NOTE — Progress Notes (Signed)
Initial Nutrition Assessment ? ?DOCUMENTATION CODES:  ? ?Severe malnutrition in context of chronic illness, Underweight ? ?INTERVENTION:  ?- will change TF regimen to Vital AF 1.2 @ 20 ml/hr to advance by 10 ml every 12 hours to reach goal rate of 50 ml/hr with 45 ml Prosource TF BID. ?- at goal rate, this regimen will provide 1520 kcal (104% kcal need), 112 grams protein, and 1168 ml free water.  ? ?- will check serum vitamin D and zinc d/t cancer dx. ? ?Monitor magnesium, potassium, and phosphorus BID for at least 3 days, MD to replete as needed, as pt is at risk for refeeding syndrome given severe malnutrition, no nutrition x5 days. ? ? ? ?NUTRITION DIAGNOSIS:  ? ?Severe Malnutrition related to chronic illness, cancer and cancer related treatments as evidenced by severe fat depletion, severe muscle depletion. ? ?GOAL:  ? ?Patient will meet greater than or equal to 90% of their needs ? ?MONITOR:  ? ?Vent status, TF tolerance, Labs, Weight trends ? ?REASON FOR ASSESSMENT:  ? ?Ventilator, Consult ?Enteral/tube feeding initiation and management ? ?ASSESSMENT:  ? ?66 y.o. male with medical history of recently diagnosed small cell lung cancer, COPD, anxiety, HTN, and depression. He presented to the ED due to progressive shortness of breath with minimal exertion x2 days. he had been finished a steroid taper shortly before admission and had recently been prescribed an appetite stimulant. ? ?Patient was discussed in rounds this AM. He was intubated this AM and OGT placed (gastric per abdominal x-ray). ? ?He is receiving TF regimen per protocol: Vital High Protein @ 40 ml/hr with 45 ml Prosource TF BID. This regimen provides 1040 kcal, 106 grams protein, and 802 grams protein. ? ?Patient was seen by a RD at the [facility] during infusion on 02/19/22. ? ?Patient's wife and daughter at bedside at the time of RD visit today. Wife provides most of the information. Patient was doing about the same from 2/23-3/9. On 3/10  (Friday) he arrived home from work and went to bed at 2 PM. Until he was brought to the hospital by EMS yesterday, he had only been out of bed to walk the short distance to the bathroom and had not had anything to eat or drink since dinner (wife thinks she made meatloaf) on 3/9. ? ?Weight on 3/12 was documented as both 106 lb and 108 lb. Weight on 3/1 was 108 lb. Weight on 01/06/22 was 114 lb. This indicates 6-8 lb weight loss (5-7% body weight) in the past 2 months. ? ? ?Patient is currently intubated on ventilator support ?MV: 10.7 L/min ?Temp (24hrs), Avg:97.6 ?F (36.4 ?C), Min:97.5 ?F (36.4 ?C), Max:97.7 ?F (36.5 ?C) ?Propofol: none ? ?Labs reviewed; creatinine: 0.35 mg/dl, Ca: 8 mg/dl. ? ?Medications reviewed; 100 mg colace BID, 1 mg folvite/day, 400 mg mag-ox/day, 40 mg solu-medrol BID, 40 mg protonix/day, 1000 mcg cyanocobalamin per OGT/day.  ? ?Drip; precedex @ 0.5 mcg/kg/hr. ?  ? ?NUTRITION - FOCUSED PHYSICAL EXAM: ? ?Flowsheet Row Most Recent Value  ?Orbital Region Severe depletion  ?Upper Arm Region Severe depletion  ?Thoracic and Lumbar Region Unable to assess  ?Buccal Region Severe depletion  ?Temple Region Severe depletion  ?Clavicle Bone Region Severe depletion  ?Clavicle and Acromion Bone Region Severe depletion  ?Scapular Bone Region Severe depletion  ?Dorsal Hand Mild depletion  ?Patellar Region Severe depletion  ?Anterior Thigh Region Severe depletion  ?Posterior Calf Region Severe depletion  ?Edema (RD Assessment) None  ?Hair Reviewed  ?Eyes Unable to  assess  ?Mouth Reviewed  ?Skin Reviewed  ?Nails Reviewed  ? ?  ? ? ?Diet Order:   ?Diet Order   ? ?       ?  Diet NPO time specified  Diet effective now       ?  ? ?  ?  ? ?  ? ? ?EDUCATION NEEDS:  ? ?No education needs have been identified at this time ? ?Skin:  Skin Assessment: Reviewed RN Assessment ? ?Last BM:  PTA/unknown ? ?Height:  ? ?Ht Readings from Last 1 Encounters:  ?03/08/22 5\' 11"  (1.803 m)  ? ? ?Weight:  ? ?Wt Readings from Last 1  Encounters:  ?03/08/22 47.9 kg  ? ? ? ?BMI:  Body mass index is 14.73 kg/m?. ? ?Estimated Nutritional Needs:  ?Kcal:  1455 kcal ?Protein:  95-120 grams ?Fluid:  >/= 2 L/day ? ? ? ? ?Jarome Matin, MS, RD, LDN ?Inpatient Clinical Dietitian ?RD pager # available in [place]  ?After hours/weekend pager # available in [place] ? ?

## 2022-03-11 ENCOUNTER — Encounter (HOSPITAL_COMMUNITY): Payer: Self-pay

## 2022-03-11 ENCOUNTER — Ambulatory Visit: Payer: Medicare Other

## 2022-03-11 ENCOUNTER — Inpatient Hospital Stay (HOSPITAL_COMMUNITY): Payer: Medicare Other

## 2022-03-11 DIAGNOSIS — J449 Chronic obstructive pulmonary disease, unspecified: Secondary | ICD-10-CM

## 2022-03-11 DIAGNOSIS — C3490 Malignant neoplasm of unspecified part of unspecified bronchus or lung: Secondary | ICD-10-CM | POA: Diagnosis not present

## 2022-03-11 DIAGNOSIS — J441 Chronic obstructive pulmonary disease with (acute) exacerbation: Secondary | ICD-10-CM | POA: Diagnosis not present

## 2022-03-11 DIAGNOSIS — J129 Viral pneumonia, unspecified: Secondary | ICD-10-CM

## 2022-03-11 DIAGNOSIS — B348 Other viral infections of unspecified site: Secondary | ICD-10-CM | POA: Diagnosis not present

## 2022-03-11 DIAGNOSIS — J9601 Acute respiratory failure with hypoxia: Secondary | ICD-10-CM | POA: Diagnosis not present

## 2022-03-11 LAB — GLUCOSE, CAPILLARY
Glucose-Capillary: 135 mg/dL — ABNORMAL HIGH (ref 70–99)
Glucose-Capillary: 136 mg/dL — ABNORMAL HIGH (ref 70–99)
Glucose-Capillary: 165 mg/dL — ABNORMAL HIGH (ref 70–99)
Glucose-Capillary: 170 mg/dL — ABNORMAL HIGH (ref 70–99)
Glucose-Capillary: 170 mg/dL — ABNORMAL HIGH (ref 70–99)
Glucose-Capillary: 176 mg/dL — ABNORMAL HIGH (ref 70–99)

## 2022-03-11 LAB — BASIC METABOLIC PANEL
Anion gap: 6 (ref 5–15)
BUN: 32 mg/dL — ABNORMAL HIGH (ref 8–23)
CO2: 28 mmol/L (ref 22–32)
Calcium: 8.4 mg/dL — ABNORMAL LOW (ref 8.9–10.3)
Chloride: 106 mmol/L (ref 98–111)
Creatinine, Ser: 0.48 mg/dL — ABNORMAL LOW (ref 0.61–1.24)
GFR, Estimated: >60 mL/min (ref >60)
Glucose, Bld: 182 mg/dL — ABNORMAL HIGH (ref 70–99)
Potassium: 4.3 mmol/L (ref 3.5–5.1)
Sodium: 140 mmol/L (ref 135–145)

## 2022-03-11 LAB — CBC
HCT: 26.2 % — ABNORMAL LOW (ref 39.0–52.0)
Hemoglobin: 8.2 g/dL — ABNORMAL LOW (ref 13.0–17.0)
MCH: 30.4 pg (ref 26.0–34.0)
MCHC: 31.3 g/dL (ref 30.0–36.0)
MCV: 97 fL (ref 80.0–100.0)
Platelets: 305 10*3/uL (ref 150–400)
RBC: 2.7 MIL/uL — ABNORMAL LOW (ref 4.22–5.81)
RDW: 19.1 % — ABNORMAL HIGH (ref 11.5–15.5)
WBC: 9.6 10*3/uL (ref 4.0–10.5)
nRBC: 0 % (ref 0.0–0.2)

## 2022-03-11 LAB — MAGNESIUM
Magnesium: 2.2 mg/dL (ref 1.7–2.4)
Magnesium: 2.4 mg/dL (ref 1.7–2.4)

## 2022-03-11 LAB — PHOSPHORUS
Phosphorus: 3.3 mg/dL (ref 2.5–4.6)
Phosphorus: 3.8 mg/dL (ref 2.5–4.6)

## 2022-03-11 MED ORDER — PREDNISONE 20 MG PO TABS
30.0000 mg | ORAL_TABLET | Freq: Every day | ORAL | Status: DC
  Administered 2022-03-12 – 2022-03-23 (×12): 30 mg
  Filled 2022-03-11 (×12): qty 1

## 2022-03-11 MED ORDER — SODIUM CHLORIDE 0.9% FLUSH
10.0000 mL | Freq: Two times a day (BID) | INTRAVENOUS | Status: DC
  Administered 2022-03-11 – 2022-03-17 (×13): 10 mL
  Administered 2022-03-18: 30 mL
  Administered 2022-03-18: 10 mL
  Administered 2022-03-19: 40 mL
  Administered 2022-03-19 – 2022-03-27 (×14): 10 mL
  Administered 2022-03-27 – 2022-03-29 (×4): 20 mL
  Administered 2022-03-29 – 2022-04-14 (×16): 10 mL

## 2022-03-11 MED ORDER — SODIUM CHLORIDE 0.9% FLUSH
10.0000 mL | INTRAVENOUS | Status: DC | PRN
  Administered 2022-03-15 – 2022-04-07 (×3): 10 mL

## 2022-03-11 NOTE — Progress Notes (Signed)
DIAGNOSIS: Limited stage (T1c, N1, M1a) small cell lung cancer presented with left upper lobe lung nodule as well as left hilar lymphadenopathy as well as right upper lobe lung nodule diagnosed in February 2023 ?  ?PRIOR THERAPY: None ?  ?CURRENT THERAPY: Systemic chemotherapy with cisplatin 80 Mg/M2 on day 1 and 2 etoposide 100 Mg/M2 on days 1, 2 and 3 concurrent with radiotherapy.  First dose February 17, 2022.  Status post 1 cycle.  ? ?Subjective: ?The patient is seen and examined today.  His girlfriend was at the bedside.  He is a very pleasant 66 years old white male who was recently diagnosed with limited stage (T1c, N1, M1 a) small cell lung cancer presented with left upper lobe lung nodule in addition to left hilar adenopathy and right upper lobe lung nodule diagnosed in February 2023.  The patient status post 1 cycle of systemic chemotherapy with cisplatin and etoposide first dose given February 17, 2022.  He also has concurrent radiation.  He was feeling well 1 week after the treatment but after starting radiation therapy the patient has been complaining of increasing fatigue and weakness as well as lack of appetite and dehydration.  He was giving a dose of Medrol Dosepak to improve his appetite and he felt much better but he presented to the emergency department via EMS with significant dehydration, lack of appetite and tachypnea.  Oxygen saturation was in the 80s when seen in the emergency department.  The patient had CT angiogram of the chest that showed no evidence for pulmonary embolism but he has chronic bolus COPD and there was improvement in the lung cancer.  His respiratory status was getting worse and the patient was intubated for respiratory failure.  He is still able to communicate by nodding.  He is expected to have extubation tomorrow.  He has no current fever or chills.  He has PEG tube for nutrition. ? ?Objective: ?Vital signs in last 24 hours: ?Temp:  [97.7 ?F (36.5 ?C)-98.6 ?F (37 ?C)]  97.9 ?F (36.6 ?C) (03/15 1645) ?Pulse Rate:  [51-84] 69 (03/15 1600) ?Resp:  [15-18] 16 (03/15 1600) ?BP: (96-154)/(51-86) 110/65 (03/15 1600) ?SpO2:  [96 %-100 %] 97 % (03/15 1600) ?FiO2 (%):  [30 %-40 %] 30 % (03/15 1453) ? ?Intake/Output from previous day: ?03/14 0701 - 03/15 0700 ?In: 3574.1 [I.V.:1231.2; NG/GT:987.5; IV Piggyback:1355.4] ?Out: 789 [Urine:690; Emesis/NG output:45] ?Intake/Output this shift: ?Total I/O ?In: 1326.2 [I.V.:556.2; NG/GT:520; IV Piggyback:250] ?Out: 210 [Urine:210] ? ?General appearance: alert and intubated ?Resp: clear to auscultation bilaterally ?Cardio: regular rate and rhythm, S1, S2 normal, no murmur, click, rub or gallop ?GI: soft, non-tender; bowel sounds normal; no masses,  no organomegaly ?Extremities: extremities normal, atraumatic, no cyanosis or edema ? ?Lab Results:  ?Recent Labs  ?  03/10/22 ?1039 03/11/22 ?0404  ?WBC 8.8 9.6  ?HGB 7.7* 8.2*  ?HCT 24.7* 26.2*  ?PLT 245 305  ? ?BMET ?Recent Labs  ?  03/10/22 ?1039 03/11/22 ?0404  ?NA 140 140  ?K 3.8 4.3  ?CL 105 106  ?CO2 26 28  ?GLUCOSE 110* 182*  ?BUN 23 32*  ?CREATININE 0.35* 0.48*  ?CALCIUM 8.0* 8.4*  ? ? ?Studies/Results: ?DG Abd 1 View ? ?Result Date: 03/10/2022 ?CLINICAL DATA:  OG tube placement. EXAM: ABDOMEN - 1 VIEW COMPARISON:  Abdominal radiograph 03/10/2022 at 9:06 a.m. FINDINGS: The enteric tube has been adjusted and no longer forms a loop in the stomach. The tip of the tube projects over the gastric body, and the  side port is also below the diaphragm. Gas is again noted in nondilated colon in the upper abdomen. A central venous catheter terminates near the superior cavoatrial junction. The lungs are hyperinflated with evidence of underlying emphysema. IMPRESSION: Enteric tube as above. Electronically Signed   By: Logan Bores M.D.   On: 03/10/2022 10:23  ? ?DG Abd 1 View ? ?Result Date: 03/10/2022 ?CLINICAL DATA:  Encounter for orogastric tube placement. EXAM: ABDOMEN - 1 VIEW COMPARISON:  Chest radiograph  03/10/2022 FINDINGS: Orogastric tube is coiled in the left upper abdomen. Bowel gas throughout the abdomen and pelvis. Hyperinflation with emphysematous changes. IMPRESSION: Orogastric tube is coiled in the stomach. Electronically Signed   By: Markus Daft M.D.   On: 03/10/2022 09:56  ? ?Portable Chest xray ? ?Result Date: 03/11/2022 ?CLINICAL DATA:  ETT position EXAM: PORTABLE CHEST 1 VIEW COMPARISON:  Radiograph 03/10/2022 FINDINGS: Endotracheal tube tip overlies the trachea approximately 5.5 cm above the carina. Enteric tube passes below the diaphragm, tip excluded by collimation. Unchanged right chest port catheter. Unchanged cardiomediastinal silhouette. There are mild interstitial opacities increased in the lung bases. Emphysema. No pneumothorax or large effusion. Bones are unchanged. IMPRESSION: Endotracheal tube tip overlies the trachea approximately 5.5 cm above the carina. Increased mild interstitial opacities in the lung bases, possibly developing edema. Electronically Signed   By: Maurine Simmering M.D.   On: 03/11/2022 08:15  ? ?Portable Chest x-ray ? ?Result Date: 03/10/2022 ?CLINICAL DATA:  Encounter for endotracheal tube placement. EXAM: PORTABLE CHEST 1 VIEW COMPARISON:  03/09/2022 FINDINGS: Endotracheal tube is 10.5 cm above the carina. Endotracheal tube is near the clavicular heads. Right jugular Port-A-Cath is stable with the tip near the superior cavoatrial junction. Again noted is hyperinflation and emphysema. No focal lung disease. Nasogastric tube extends into the abdomen but the tip is beyond the image. Heart size is normal. IMPRESSION: 1. Endotracheal tube is near the thoracic inlet at the level of the clavicular heads. 2. Emphysema without focal lung disease. 3. Placement of nasogastric tube.  The tip is beyond the image. Electronically Signed   By: Markus Daft M.D.   On: 03/10/2022 09:54  ? ?DG CHEST PORT 1 VIEW ? ?Result Date: 03/09/2022 ?CLINICAL DATA:  Respiratory failure.  Status post colon loop.  EXAM: PORTABLE CHEST 1 VIEW COMPARISON:  Chest radiograph dated 03/08/2022 and CT dated 03/08/2022 FINDINGS: Right-sided Port-A-Cath with tip at the cavoatrial junction. Background of emphysema. There is blunting of the costophrenic angles which may be related to emphysema and scarring. Trace pleural effusions are less likely. No consolidative changes. No pneumothorax. The cardiac silhouette is within limits. Atherosclerotic calcification of the aorta. No acute osseous pathology. IMPRESSION: Emphysema. No acute cardiopulmonary process. Electronically Signed   By: Anner Crete M.D.   On: 03/09/2022 19:17   ? ?Medications: I have reviewed the patient's current medications. ? ?Assessment/Plan: ?This is a very pleasant 66 years old white male recently diagnosed with limited stage small cell lung cancer status post 1 cycle of systemic chemotherapy with cisplatin and etoposide on February 17, 2022 as well as concurrent radiation.  The patient has some good response to the treatment even after 2 weeks of the first cycle but unfortunately has significant respiratory distress requiring intubation secondary to COPD.  He also has lack of appetite and dehydration. ?He is currently intubated but expected to recover soon with extubation in the next few days.  I would recommend for the patient to continue with the nutritional supplements via the PEG  tube for now. ?I will arrange for the patient a follow-up appointment after discharge from the hospital and I may adjust his chemotherapy to avoid any significant complication with the second cycle. ?His girlfriend was at the bedside and she is in agreement with the current plan. ?Thank you for taking good care of Mr. Schneck, I will continue to follow-up the patient with you and assist in his management on as-needed basis.  ? LOS: 2 days  ? ? ?Eilleen Kempf ?03/11/2022 ? ?  ?

## 2022-03-11 NOTE — Consult Note (Signed)
? ?NAME:  Mark Yu, MRN:  [id number], DOB:  [birth year], LOS: 2 ?ADMISSION DATE:  03/08/2022, CONSULTATION DATE:  3/14 ?REFERRING MD: Dr. Loleta Books, CHIEF COMPLAINT: SOB  ? ?History of Present Illness:  ?66 yo male smoker presented to [facility] with dyspnea.  He has hx of severe COPD with emphysema and recent diagnosis of extensive stage SCLC on concurrent chemoradiation therapy.  Found to have rhinovirus.  Started on therapy for COPD exacerbation.  Had progressive hypoxia and transferred to SDU 3/13 to start on Bipap.  Respiratory status and mental status got worse, and he required intubation on 3/14.  PCCM assumed care in ICU. ? ?Pertinent  Medical History  ?Severe COPD with emphysema, Depression, Anxiety, HTN, extensive stage SCLC ? ?Significant Hospital Events: ?Including procedures, antibiotic start and stop dates in addition to other pertinent events   ?3/12 Admit with dyspnea, AECOPD in setting of rhinovirus  ?3/14 PCCM consulted for evaluation of respiratory failure. Intubated for airway protection  ? ?Interim History / Subjective:  ?Remains on sedation, vent support. ? ?Objective   ?BP 110/62   Pulse (!) 55   Temp 97.9 ?F (36.6 ?C) (Oral)   Resp 16   Ht 5\' 11"  (1.803 m)   Wt 47.9 kg   SpO2 98%   BMI 14.73 kg/m?  ? ?Vent Mode: PRVC ?FiO2 (%):  [30 %-100 %] 30 % ?Set Rate:  [16 bmp-20 bmp] 16 bmp ?Vt Set:  [620 mL] 620 mL ?PEEP:  [5 cmH20] 5 cmH20 ?Plateau Pressure:  [16 cmH20-23 cmH20] 22 cmH20  ? ?Intake/Output Summary (Last 24 hours) at 03/11/2022 0808 ?Last data filed at 03/11/2022 0700 ?Gross per 24 hour  ?Intake 3574.11 ml  ?Output 735 ml  ?Net 2839.11 ml  ? ?Examination: ? ?General - sedated, cachectic ?Eyes - pupils reactive ?ENT - ETT in place ?Cardiac - regular rate/rhythm, no murmur ?Chest - decreased BS, no wheeze ?Abdomen - soft, non tender, + bowel sounds ?Extremities - decreased muscle bulk; no cyanosis, clubbing, or edema ?Skin - no rashes ?Neuro - RASS -1 ? ?Resolved Hospital Problem list    ? ? ?Assessment & Plan:  ? ?Acute on chronic hypoxic/hypercapnic respiratory failure from rhinovirus pneumonia with COPD exacerbation. 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 ?- continue vent support and adjust settings to avoid PEEPi ?- f/u CXR ?- continue brovana, yupelri, pulmicort ?- prn albuterol ?- day 3 of zithromax ?- continue solumedrol on 3/15 >> will change to prednisone 30 mg on 3/16 ? ?Acute metabolic encephalopathy from hypoxia/hypercapnia. ?Hx of anxiety and depression. ?- RASS goal 0 to -1 ? ?Extensive stage small cell lung cancer. ?- diagnosed February 2023 ?- hold XRT for now >> might be able to resume later this week if he improves ?- cisplatin/etoposide on hold until he recovers from this acute illness ?- followed by Dr. Julien Nordmann and Dr. Lisbeth Renshaw as outpt ? ?Cachexia with severe protein calorie malnutrition in setting of COPD and lung cancer. ?- continue tube feeds ? ?HTN. ?- continue amlodipine  ?- goal BP normotensive ? ?Steroid Induced Hyperglycemia. ?- glucose goal 140-180 while in ICU ? ?Anemia of critical illness and chronic disease. ?- f/u CBC ? ?Tinea of glans penis. ?- continue nystatin powder ? ?Best Practice (right click and "Reselect all SmartList Selections" daily)  ?Diet/type: tubefeeds ?DVT prophylaxis: LMWH ?GI prophylaxis: PPI ?Lines: Central line > port/chronic  ?Foley:  N/A ?Code Status:  full code ?Last date of multidisciplinary goals of care discussion: updated pt's girlfriend on 3/14 ? ?  Labs   ? ?CMP Latest Ref Rng & Units 03/11/2022 03/10/2022 03/09/2022  ?Glucose 70 - 99 mg/dL 182(H) 110(H) 168(H)  ?BUN 8 - 23 mg/dL 32(H) 23 16  ?Creatinine 0.61 - 1.24 mg/dL 0.48(L) 0.35(L) 0.48(L)  ?Sodium 135 - 145 mmol/L 140 140 135  ?Potassium 3.5 - 5.1 mmol/L 4.3 3.8 3.9  ?Chloride 98 - 111 mmol/L 106 105 100  ?CO2 22 - 32 mmol/L 28 26 28   ?Calcium 8.9 - 10.3 mg/dL 8.4(L) 8.0(L) 8.4(L)  ?Total Protein 6.5 - 8.1 g/dL - - -  ?Total Bilirubin 0.3 - 1.2 mg/dL -  - -  ?Alkaline Phos 38 - 126 U/L - - -  ?AST 15 - 41 U/L - - -  ?ALT 0 - 44 U/L - - -  ? ? ?CBC Latest Ref Rng & Units 03/11/2022 03/10/2022 03/09/2022  ?WBC 4.0 - 10.5 K/uL 9.6 8.8 11.2(H)  ?Hemoglobin 13.0 - 17.0 g/dL 8.2(L) 7.7(L) 9.2(L)  ?Hematocrit 39.0 - 52.0 % 26.2(L) 24.7(L) 28.6(L)  ?Platelets 150 - 400 K/uL 305 245 346  ? ? ?CBG (last 3)  ?Recent Labs  ?  03/10/22 ?2335 03/11/22 ?7048 03/11/22 ?8891  ?GLUCAP 170* 170* 176*  ? ? ?ABG ?   ?Component Value Date/Time  ? PHART 7.36 03/10/2022 1050  ? PCO2ART 50 (H) 03/10/2022 1050  ? PO2ART 83 03/10/2022 1050  ? HCO3 28.2 (H) 03/10/2022 1050  ? ACIDBASEDEF 4.6 (H) 03/09/2022 1836  ? O2SAT 97 03/10/2022 1050  ? ? ?Critical care time: 35 minutes  ?Chesley Mires, MD ?[place] ?Pager - [phone] ?03/11/2022, 8:25 AM ? ? ? ? ? ? ?

## 2022-03-12 ENCOUNTER — Ambulatory Visit
Admission: RE | Admit: 2022-03-12 | Discharge: 2022-03-12 | Disposition: A | Discharge status: Home / Self Care | Payer: Medicare Other | Source: Ambulatory Visit | Attending: Radiation Oncology | Admitting: Radiation Oncology

## 2022-03-12 ENCOUNTER — Inpatient Hospital Stay (HOSPITAL_COMMUNITY): Payer: Medicare Other

## 2022-03-12 DIAGNOSIS — J9601 Acute respiratory failure with hypoxia: Secondary | ICD-10-CM | POA: Diagnosis not present

## 2022-03-12 DIAGNOSIS — J441 Chronic obstructive pulmonary disease with (acute) exacerbation: Secondary | ICD-10-CM | POA: Diagnosis not present

## 2022-03-12 LAB — BASIC METABOLIC PANEL
Anion gap: 7 (ref 5–15)
BUN: 37 mg/dL — ABNORMAL HIGH (ref 8–23)
CO2: 27 mmol/L (ref 22–32)
Calcium: 8.7 mg/dL — ABNORMAL LOW (ref 8.9–10.3)
Chloride: 104 mmol/L (ref 98–111)
Creatinine, Ser: 0.51 mg/dL — ABNORMAL LOW (ref 0.61–1.24)
GFR, Estimated: >60 mL/min (ref >60)
Glucose, Bld: 175 mg/dL — ABNORMAL HIGH (ref 70–99)
Potassium: 4.2 mmol/L (ref 3.5–5.1)
Sodium: 138 mmol/L (ref 135–145)

## 2022-03-12 LAB — GLUCOSE, CAPILLARY
Glucose-Capillary: 124 mg/dL — ABNORMAL HIGH (ref 70–99)
Glucose-Capillary: 126 mg/dL — ABNORMAL HIGH (ref 70–99)
Glucose-Capillary: 131 mg/dL — ABNORMAL HIGH (ref 70–99)
Glucose-Capillary: 142 mg/dL — ABNORMAL HIGH (ref 70–99)
Glucose-Capillary: 143 mg/dL — ABNORMAL HIGH (ref 70–99)
Glucose-Capillary: 153 mg/dL — ABNORMAL HIGH (ref 70–99)
Glucose-Capillary: 166 mg/dL — ABNORMAL HIGH (ref 70–99)

## 2022-03-12 LAB — CBC
HCT: 28.1 % — ABNORMAL LOW (ref 39.0–52.0)
Hemoglobin: 8.7 g/dL — ABNORMAL LOW (ref 13.0–17.0)
MCH: 30.1 pg (ref 26.0–34.0)
MCHC: 31 g/dL (ref 30.0–36.0)
MCV: 97.2 fL (ref 80.0–100.0)
Platelets: 352 10*3/uL (ref 150–400)
RBC: 2.89 MIL/uL — ABNORMAL LOW (ref 4.22–5.81)
RDW: 19.5 % — ABNORMAL HIGH (ref 11.5–15.5)
WBC: 10.2 10*3/uL (ref 4.0–10.5)
nRBC: 0.2 % (ref 0.0–0.2)

## 2022-03-12 LAB — ZINC: Zinc: 38 ug/dL — ABNORMAL LOW (ref 44–115)

## 2022-03-12 LAB — VITAMIN D 25 HYDROXY (VIT D DEFICIENCY, FRACTURES): Vit D, 25-Hydroxy: 30.48 ng/mL (ref 30–100)

## 2022-03-12 NOTE — Progress Notes (Signed)
RT NOTE: ? ?RT attempted to wean pt from ventilator with PSV/CPAP of 10 and a PEEP of 5. Pt was turned to wean at 0759. Pt saturations dropped down to 85% at 0817 and pt was switched back to full support mode. CCMD and RN are aware. O2 breath was administered and O2 saturations came up 96%. Vitals are stable at this time, RT will continue to monitor.  ?

## 2022-03-12 NOTE — Progress Notes (Signed)
? ?NAME:  Mark Yu, MRN:  [id number], DOB:  [birth year], LOS: 3 ?ADMISSION DATE:  03/08/2022, CONSULTATION DATE:  3/14 ?REFERRING MD: Dr. Loleta Books, CHIEF COMPLAINT: SOB  ? ?History of Present Illness:  ?66 yo male smoker presented to [facility] with dyspnea.  He has hx of severe COPD with emphysema and recent diagnosis of extensive stage SCLC on concurrent chemoradiation therapy.  Found to have rhinovirus.  Started on therapy for COPD exacerbation.  Had progressive hypoxia and transferred to SDU 3/13 to start on Bipap.  Respiratory status and mental status got worse, and he required intubation on 3/14.  PCCM assumed care in ICU. ? ?Pertinent  Medical History  ?Severe COPD with emphysema, Depression, Anxiety, HTN, extensive stage SCLC ? ?Significant Hospital Events: ?Including procedures, antibiotic start and stop dates in addition to other pertinent events   ?3/12 Admit with dyspnea, AECOPD in setting of rhinovirus  ?3/14 PCCM consulted for evaluation of respiratory failure. Intubated for airway protection  ? ?Interim History / Subjective:  ?Low Vt and decreased in SpO2 with SBT this AM. ? ?Objective   ?BP 111/74 (BP Location: Right Arm)   Pulse 76   Temp (!) 97.2 ?F (36.2 ?C) (Axillary)   Resp 20   Ht 5\' 11"  (1.803 m)   Wt 51.5 kg   SpO2 95%   BMI 15.84 kg/m?  ? ?Vent Mode: PRVC ?FiO2 (%):  [30 %] 30 % ?Set Rate:  [14 bmp] 14 bmp ?Vt Set:  [620 mL] 620 mL ?PEEP:  [5 cmH20] 5 cmH20 ?Plateau Pressure:  [12 cmH20-20 cmH20] 20 cmH20  ? ?Intake/Output Summary (Last 24 hours) at 03/12/2022 1572 ?Last data filed at 03/12/2022 0600 ?Gross per 24 hour  ?Intake 2343.89 ml  ?Output 835 ml  ?Net 1508.89 ml  ? ?Examination: ? ?General - sedated ?Eyes - pupils reactive ?ENT - ETT in place ?Cardiac - regular rate/rhythm, no murmur ?Chest - decreased BS, no wheeze ?Abdomen - soft, non tender, + bowel sounds ?Extremities - no cyanosis, clubbing, or edema ?Skin - no rashes ?Neuro - RASS -1 ? ?Resolved Hospital Problem list    ? ? ?Assessment & Plan:  ? ?Acute on chronic hypoxic/hypercapnic respiratory failure from rhinovirus pneumonia with COPD exacerbation. 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 ?- pressure support wean as able; not ready for extubation trial at this time ?- suspect he will need NIV support as a bridge after extubation ?- f/u CXR intermittently ?- continue brovana, yupelri, pulmicort ?- Abx day 4 of 5, currently on zithromax ?- changed to prednisone 30 mg daily on 3/16, and continue to wean as tolerated ? ?Acute metabolic encephalopathy from hypoxia/hypercapnia. ?Hx of anxiety and depression. ?- RASS goal 0 to -1 ? ?Extensive stage small cell lung cancer. ?- diagnosed February 2023 ?- okay to resume XRT on 3/16 ?- cisplatin/etoposide on hold until he recovers from this acute illness ?- followed by Dr. Julien Nordmann and Dr. Lisbeth Renshaw as outpt ? ?Cachexia with severe protein calorie malnutrition in setting of COPD and lung cancer. ?- continue tube feeds ? ?HTN. ?- continue amlodipine ?- goal BP normotensive ? ?Steroid Induced Hyperglycemia. ?- glucose goal 140-180 while in ICU ? ?Anemia of critical illness and chronic disease. ?- f/u CBC ?- transfuse for Hb < 7 ? ?Tinea of glans penis. ?- continue nystatin powder ? ?Best Practice (right click and "Reselect all SmartList Selections" daily)  ?Diet/type: tubefeeds ?DVT prophylaxis: LMWH ?GI prophylaxis: PPI ?Lines: Central line > port/chronic  ?Foley:  N/A ?Code  Status:  full code ?Last date of multidisciplinary goals of care discussion: updated pt's girlfriend on 3/14 ? ?Labs   ? ?CMP Latest Ref Rng & Units 03/12/2022 03/11/2022 03/10/2022  ?Glucose 70 - 99 mg/dL 175(H) 182(H) 110(H)  ?BUN 8 - 23 mg/dL 37(H) 32(H) 23  ?Creatinine 0.61 - 1.24 mg/dL 0.51(L) 0.48(L) 0.35(L)  ?Sodium 135 - 145 mmol/L 138 140 140  ?Potassium 3.5 - 5.1 mmol/L 4.2 4.3 3.8  ?Chloride 98 - 111 mmol/L 104 106 105  ?CO2 22 - 32 mmol/L 27 28 26   ?Calcium 8.9 - 10.3 mg/dL 8.7(L)  8.4(L) 8.0(L)  ?Total Protein 6.5 - 8.1 g/dL - - -  ?Total Bilirubin 0.3 - 1.2 mg/dL - - -  ?Alkaline Phos 38 - 126 U/L - - -  ?AST 15 - 41 U/L - - -  ?ALT 0 - 44 U/L - - -  ? ? ?CBC Latest Ref Rng & Units 03/12/2022 03/11/2022 03/10/2022  ?WBC 4.0 - 10.5 K/uL 10.2 9.6 8.8  ?Hemoglobin 13.0 - 17.0 g/dL 8.7(L) 8.2(L) 7.7(L)  ?Hematocrit 39.0 - 52.0 % 28.1(L) 26.2(L) 24.7(L)  ?Platelets 150 - 400 K/uL 352 305 245  ? ? ?CBG (last 3)  ?Recent Labs  ?  03/12/22 ?0003 03/12/22 ?0350 03/12/22 ?0743  ?GLUCAP 143* 153* 166*  ? ? ?ABG ?   ?Component Value Date/Time  ? PHART 7.36 03/10/2022 1050  ? PCO2ART 50 (H) 03/10/2022 1050  ? PO2ART 83 03/10/2022 1050  ? HCO3 28.2 (H) 03/10/2022 1050  ? ACIDBASEDEF 4.6 (H) 03/09/2022 1836  ? O2SAT 97 03/10/2022 1050  ? ? ?Critical care time: 32 minutes  ?Chesley Mires, MD ?[place] ?Pager - [phone] ?03/12/2022, 8:12 AM ? ? ? ? ? ? ?

## 2022-03-12 NOTE — TOC Progression Note (Signed)
Transition of Care (TOC) - Progression Note  ? ? ?Patient Details  ?Name: Mark Yu ?MRN: [id number] ?Date of Birth: 1956-09-22 ? ?Transition of Care (TOC) CM/SW Contact  ?Dessa Phi, RN ?Phone Number: ?03/12/2022, 9:38 AM ? ?Clinical Narrative: On vent. Spoke to [employer] person) states he does not have 02 in the home.   ? ? ? ?Expected Discharge Plan: Home/Self Care ?Barriers to Discharge: Continued Medical Work up ? ?Expected Discharge Plan and Services ?Expected Discharge Plan: Home/Self Care ?  ?Discharge Planning Services: CM Consult ?  ?Living arrangements for the past 2 months: [place] ?                ?  ?  ?  ?  ?  ?  ?  ?  ?  ?  ? ? ?Social Determinants of Health (SDOH) Interventions ?  ? ?Readmission Risk Interventions ?Readmission Risk Prevention Plan 03/09/2022  ?Transportation Screening Complete  ?PCP or Specialist Appt within 3-5 Days Complete  ?[place] or Home Care Consult Complete  ?Social Work Consult for [place] Planning/Counseling Complete  ?Medication Review [employer] photographer) Complete  ? ? ?

## 2022-03-12 NOTE — Progress Notes (Signed)
RT NOTE: ? ?Pt transported to and from radiation via transport ventilator with no complications noted.  ?

## 2022-03-13 ENCOUNTER — Ambulatory Visit
Admission: RE | Admit: 2022-03-13 | Discharge: 2022-03-13 | Disposition: A | Discharge status: Home / Self Care | Payer: Medicare Other | Source: Ambulatory Visit | Attending: Radiation Oncology | Admitting: Radiation Oncology

## 2022-03-13 ENCOUNTER — Inpatient Hospital Stay (HOSPITAL_COMMUNITY): Payer: Medicare Other

## 2022-03-13 DIAGNOSIS — J9601 Acute respiratory failure with hypoxia: Secondary | ICD-10-CM | POA: Diagnosis not present

## 2022-03-13 LAB — GLUCOSE, CAPILLARY
Glucose-Capillary: 113 mg/dL — ABNORMAL HIGH (ref 70–99)
Glucose-Capillary: 131 mg/dL — ABNORMAL HIGH (ref 70–99)
Glucose-Capillary: 124 mg/dL — ABNORMAL HIGH (ref 70–99)
Glucose-Capillary: 140 mg/dL — ABNORMAL HIGH (ref 70–99)
Glucose-Capillary: 130 mg/dL — ABNORMAL HIGH (ref 70–99)

## 2022-03-13 LAB — CBC
HCT: 25.1 % — ABNORMAL LOW (ref 39.0–52.0)
Hemoglobin: 7.7 g/dL — ABNORMAL LOW (ref 13.0–17.0)
MCH: 30.4 pg (ref 26.0–34.0)
MCHC: 30.7 g/dL (ref 30.0–36.0)
MCV: 99.2 fL (ref 80.0–100.0)
Platelets: 324 10*3/uL (ref 150–400)
RBC: 2.53 MIL/uL — ABNORMAL LOW (ref 4.22–5.81)
RDW: 19.9 % — ABNORMAL HIGH (ref 11.5–15.5)
WBC: 7.3 10*3/uL (ref 4.0–10.5)
nRBC: 1 % — ABNORMAL HIGH (ref 0.0–0.2)

## 2022-03-13 LAB — BASIC METABOLIC PANEL
Anion gap: 6 (ref 5–15)
BUN: 33 mg/dL — ABNORMAL HIGH (ref 8–23)
CO2: 30 mmol/L (ref 22–32)
Calcium: 8.4 mg/dL — ABNORMAL LOW (ref 8.9–10.3)
Chloride: 105 mmol/L (ref 98–111)
Creatinine, Ser: 0.35 mg/dL — ABNORMAL LOW (ref 0.61–1.24)
GFR, Estimated: >60 mL/min (ref >60)
Glucose, Bld: 123 mg/dL — ABNORMAL HIGH (ref 70–99)
Potassium: 4 mmol/L (ref 3.5–5.1)
Sodium: 141 mmol/L (ref 135–145)

## 2022-03-13 MED ORDER — ZINC SULFATE 220 (50 ZN) MG PO CAPS
220.0000 mg | ORAL_CAPSULE | Freq: Every day | ORAL | Status: AC
Start: 2022-03-13 — End: 2022-04-03
  Administered 2022-03-13 – 2022-04-02 (×20): 220 mg
  Filled 2022-03-13 (×21): qty 1

## 2022-03-13 MED ORDER — VITAMIN D 25 MCG (1000 UNIT) PO TABS
1000.0000 [IU] | ORAL_TABLET | Freq: Every day | ORAL | Status: DC
  Administered 2022-03-13 – 2022-04-17 (×36): 1000 [IU]
  Filled 2022-03-13 (×36): qty 1

## 2022-03-13 MED ORDER — PROSOURCE TF PO LIQD
45.0000 mL | Freq: Every day | ORAL | Status: DC
  Administered 2022-03-14 – 2022-03-19 (×6): 45 mL
  Filled 2022-03-13 (×6): qty 45

## 2022-03-13 MED ORDER — VITAL AF 1.2 CAL PO LIQD
1000.0000 mL | ORAL | Status: DC
  Administered 2022-03-13 – 2022-03-18 (×5): 1000 mL

## 2022-03-13 MED ORDER — DEXMEDETOMIDINE HCL IN NACL 200 MCG/50ML IV SOLN
0.4000 ug/kg/h | INTRAVENOUS | Status: DC
  Administered 2022-03-13: 0.5 ug/kg/h via INTRAVENOUS
  Administered 2022-03-13: 0.7 ug/kg/h via INTRAVENOUS
  Administered 2022-03-14: 0.5 ug/kg/h via INTRAVENOUS
  Administered 2022-03-14: 0.4 ug/kg/h via INTRAVENOUS
  Administered 2022-03-14: 0.5 ug/kg/h via INTRAVENOUS
  Administered 2022-03-14: 0.6 ug/kg/h via INTRAVENOUS
  Administered 2022-03-15: 0.5 ug/kg/h via INTRAVENOUS
  Administered 2022-03-15: 0.7 ug/kg/h via INTRAVENOUS
  Administered 2022-03-15: 0.9 ug/kg/h via INTRAVENOUS
  Administered 2022-03-15: 0.6 ug/kg/h via INTRAVENOUS
  Administered 2022-03-16 (×3): 0.9 ug/kg/h via INTRAVENOUS
  Administered 2022-03-16: 0.8 ug/kg/h via INTRAVENOUS
  Administered 2022-03-16: 0.4 ug/kg/h via INTRAVENOUS
  Administered 2022-03-17: 0.9 ug/kg/h via INTRAVENOUS
  Administered 2022-03-17: 1 ug/kg/h via INTRAVENOUS
  Administered 2022-03-17: 0.8 ug/kg/h via INTRAVENOUS
  Administered 2022-03-17: 1.1 ug/kg/h via INTRAVENOUS
  Administered 2022-03-17: 1 ug/kg/h via INTRAVENOUS
  Filled 2022-03-13 (×19): qty 50

## 2022-03-13 NOTE — Progress Notes (Signed)
Nutrition Follow-up ? ?DOCUMENTATION CODES:  ? ?Severe malnutrition in context of chronic illness, Underweight ? ?INTERVENTION:  ?- will adjust TF regimen: Vital AF 1.2 @ 55 ml/hr with 45 ml Prosource TF once/day. ?- this regimen will provide 1624 kcal, 110 grams protein, and 1070 ml free water. ?- added 1000 units cholecalciferol/day per OGT and 220 mg zinc sulfate/day per OGT. ? ?NUTRITION DIAGNOSIS:  ? ?Severe Malnutrition related to chronic illness, cancer and cancer related treatments as evidenced by severe fat depletion, severe muscle depletion. -ongoing ? ?GOAL:  ? ?Patient will meet greater than or equal to 90% of their needs -met with TF regimen ? ?MONITOR:  ? ?Vent status, TF tolerance, Labs, Weight trends ? ?ASSESSMENT:  ? ?66 y.o. male with medical history of recently diagnosed small cell lung cancer, COPD, anxiety, HTN, and depression. He presented to the ED due to progressive shortness of breath with minimal exertion x2 days. he had been finished a steroid taper shortly before admission and had recently been prescribed an appetite stimulant. ? ?Patient discussed in rounds. Patient went to XRT earlier today. Patient is tolerating TF regimen at ordered goal. ? ?He remains intubated at this time with OGT in place. He is receiving Vital AF 1.2 @ 50 ml/hr with 45 ml Prosource BID. This regimen is providing 1520 kcal, 112 grams protein, and 973 ml free water. ? ?Weight was +7.5 lb from 3/12-3/16 and then +12.5 lb from yesterday to today. Used weight from yesterday (51.5 kg) to estimate needs. No information documented in the edema section of flow sheet this hospitalization. He is noted to be +6.3 L since admission.  ? ? ?Patient is currently intubated on ventilator support ?MV: 11.8 L/min ?Temp (24hrs), Avg:98 ?F (36.7 ?C), Min:97.4 ?F (36.3 ?C), Max:98.6 ?F (37 ?C) ?Propofol: none ?BP: 138/98 and MAP: 103 ? ? ?Labs reviewed; CBGs: 113, 131, 124 mg/dl, BUN: 33 mg/dl, creatinine: 0.35 mg/dl, Ca: 8.4 mg/dl,  vitamin D very low end of normal, zinc: 38 (reference range: 44-115). ? ?Medications reviewed; 100 mg colace BID, 1 mg folvite/day, 400 mg mag-ox/day, 40 mg protonix/day per OGT, 17 g miralax/day, 30 mg deltasone/day, 1000 mcg cyanocobalamin per OGT/day. ? ?Drip; precedex @ 0.6 mcg/kg/hr.  ? ? ?Diet Order:   ?Diet Order   ? ?       ?  Diet NPO time specified  Diet effective now       ?  ? ?  ?  ? ?  ? ? ?EDUCATION NEEDS:  ? ?No education needs have been identified at this time ? ?Skin:  Skin Assessment: Reviewed RN Assessment ? ?Last BM:  PTA/unknown ? ?Height:  ? ?Ht Readings from Last 1 Encounters:  ?03/08/22 5' 11"  (1.803 m)  ? ? ?Weight:  ? ?Wt Readings from Last 1 Encounters:  ?03/13/22 57.1 kg  ? ? ? ?BMI:  Body mass index is 17.56 kg/m?. ? ?Estimated Nutritional Needs:  ?Kcal:  1606 kcal ?Protein:  95-120 grams ?Fluid:  >/= 2 L/day ? ? ? ? ?Mark Matin, MS, RD, LDN ?Registered Dietitian II ?Inpatient Clinical Nutrition ?RD pager # and on-call/weekend pager # available in [place]  ? ?

## 2022-03-13 NOTE — Progress Notes (Signed)
RT NOTE: ? ?Pt taken to and from radiation therapy via transport ventilator with no complications noted.  ?

## 2022-03-13 NOTE — Progress Notes (Signed)
? ?NAME:  Mark Yu, MRN:  [id number], DOB:  06/04/1956, LOS: 4 ?ADMISSION DATE:  03/08/2022, CONSULTATION DATE:  3/14 ?REFERRING MD: Dr. Loleta Books, CHIEF COMPLAINT: SOB  ? ?History of Present Illness:  ?66 yo male smoker presented to [facility] with dyspnea.  He has hx of severe COPD with emphysema and recent diagnosis of extensive stage SCLC on concurrent chemoradiation therapy.  Found to have rhinovirus.  Started on therapy for COPD exacerbation.  Had progressive hypoxia and transferred to SDU 3/13 to start on Bipap.  Respiratory status and mental status got worse, and he required intubation on 3/14.  PCCM assumed care in ICU. ? ?Pertinent  Medical History  ?Severe COPD with emphysema, Depression, Anxiety, HTN, extensive stage SCLC ?Concurrent systemic chemotherapy and radiation treatment ?Rhinovirus infection ? ?Significant Hospital Events: ?Including procedures, antibiotic start and stop dates in addition to other pertinent events   ?3/12 Admit with dyspnea, AECOPD in setting of rhinovirus  ?3/14 PCCM consulted for evaluation of respiratory failure. Intubated for airway protection  ?3/17-still on vent ? ?Interim History / Subjective:  ?Did not tolerate weaning 3/16 ?Holding off weaning this morning until after radiation treatment ? ?Objective   ?BP 125/78   Pulse 95   Temp 98.3 ?F (36.8 ?C) (Oral)   Resp 15   Ht 5\' 11"  (1.803 m)   Wt 57.1 kg   SpO2 99%   BMI 17.56 kg/m?  ? ?Vent Mode: PRVC ?FiO2 (%):  [40 %-60 %] 40 % ?Set Rate:  [14 bmp] 14 bmp ?Vt Set:  [620 mL] 620 mL ?PEEP:  [5 cmH20] 5 cmH20 ?Plateau Pressure:  [19 cmH20-24 cmH20] 24 cmH20  ? ?Intake/Output Summary (Last 24 hours) at 03/13/2022 0938 ?Last data filed at 03/13/2022 0800 ?Gross per 24 hour  ?Intake 2971.81 ml  ?Output 1200 ml  ?Net 1771.81 ml  ? ?Examination: ? ?General -sedated, ?Eyes -pupils reactive ?ENT -endotracheal tube in place ?Cardiac -S1-S2 appreciated with no murmur ?Chest -decreased breath sounds bilaterally, rare wheezes ?Abdomen  -bowel sounds appreciated ?Extremities -no clubbing, no cyanosis ?Skin - no rashes ?Neuro -sedated, -1 RASS ? ?Resolved Hospital Problem list   ? ? ?Assessment & Plan:  ? ?Acute on chronic hypoxic/hypercapnic respiratory failure from rhinovirus pneumonia with COPD exacerbation ?Extensive centrilobular and bullous emphysema ?[name] ?-With significantly decreased pulmonary reserves and extensive emphysema complicated by viral infection ?-May take him a while to be able to wean successfully ?-Follow chest x-ray intermittently ?-Day 5 of antibiotics ?-Continue steroids ?-We will continue to attempt to wean as tolerated ? ?Metabolic encephalopathy ?History of depression and anxiety ?-Monitor closely ?-Lighten sedation as tolerated ? ?Limited stage small cell lung cancer ?-On radiation therapy ?-S/p 1 cycle of chemotherapy with cisplatin/etoposide ?-Followed by Dr. Lisbeth Renshaw and Earlie Server ? ?Protein calorie malnutrition ?Cachexia ?-Continue tube feeds ? ?Hypertension ?-Continue home amlodipine ? ?Steroid-induced hyperglycemia ?-Continue glucose goal of 1 40-1 80 ? ?Nystatin powder for glans penis tinea ? ?Anemia critical illness ?-Continue to monitor closely ?-Transfuse per protocol if needed ? ?Discussed with girlfriend at bedside ?May still take a little bit of time for them to be able to wean successfully ? ?He does have radiation treatment plan for about midday, will attempt weaning posttreatment ? ?Best Practice (right click and "Reselect all SmartList Selections" daily)  ?Diet/type: tubefeeds ?DVT prophylaxis: LMWH ?GI prophylaxis: PPI ?Lines: Central line > port/chronic  ?Foley:  N/A ?Code Status:  full code ?Last date of multidisciplinary goals of care discussion: updated pt's girlfriend on 3/14 ? ?  Labs   ? ?CMP Latest Ref Rng & Units 03/13/2022 03/12/2022 03/11/2022  ?Glucose 70 - 99 mg/dL 123(H) 175(H) 182(H)  ?BUN 8 - 23 mg/dL 33(H) 37(H) 32(H)  ?Creatinine 0.61 - 1.24 mg/dL 0.35(L) 0.51(L) 0.48(L)   ?Sodium 135 - 145 mmol/L 141 138 140  ?Potassium 3.5 - 5.1 mmol/L 4.0 4.2 4.3  ?Chloride 98 - 111 mmol/L 105 104 106  ?CO2 22 - 32 mmol/L 30 27 28   ?Calcium 8.9 - 10.3 mg/dL 8.4(L) 8.7(L) 8.4(L)  ?Total Protein 6.5 - 8.1 g/dL - - -  ?Total Bilirubin 0.3 - 1.2 mg/dL - - -  ?Alkaline Phos 38 - 126 U/L - - -  ?AST 15 - 41 U/L - - -  ?ALT 0 - 44 U/L - - -  ? ? ?CBC Latest Ref Rng & Units 03/13/2022 03/12/2022 03/11/2022  ?WBC 4.0 - 10.5 K/uL 7.3 10.2 9.6  ?Hemoglobin 13.0 - 17.0 g/dL 7.7(L) 8.7(L) 8.2(L)  ?Hematocrit 39.0 - 52.0 % 25.1(L) 28.1(L) 26.2(L)  ?Platelets 150 - 400 K/uL 324 352 305  ? ? ?CBG (last 3)  ?Recent Labs  ?  03/12/22 ?2344 03/13/22 ?0359 03/13/22 ?0745  ?GLUCAP 124* 113* 131*  ? ? ?ABG ?   ?Component Value Date/Time  ? PHART 7.36 03/10/2022 1050  ? PCO2ART 50 (H) 03/10/2022 1050  ? PO2ART 83 03/10/2022 1050  ? HCO3 28.2 (H) 03/10/2022 1050  ? ACIDBASEDEF 4.6 (H) 03/09/2022 1836  ? O2SAT 97 03/10/2022 1050  ? ?The patient is critically ill with multiple organ systems failure and requires high complexity decision making for assessment and support, frequent evaluation and titration of therapies, application of advanced monitoring technologies and extensive interpretation of multiple databases. Critical Care Time devoted to patient care services described in this note independent of APP/resident time (if applicable)  is 31 minutes.  ? ?Sherrilyn Rist MD ?[facility] [place] [facility] ?Personal pager: See Shea Evans ?If unanswered, please page ?CCM On-call: [phone] ? ? ? ? ?

## 2022-03-13 NOTE — Plan of Care (Addendum)
Spoke with patient's sisters at bedside and then outside the room ? ?Had discussions regarding ongoing issues, ongoing treatment for small cell lung cancer, background history of severe obstructive lung disease, recent deterioration in functional status, overlying rhinovirus infection on top of baseline dysfunction ? ?They will attempt to have some discussion with patient who appears to be able to interact with lower doses of sedation ? ?I did encourage them to designate 1 person that we can reach out to if we need concerns, same person to be updated to disseminate information to the rest of the siblings. ? ?We will continue to wean as tolerated ? ?Not ready for extubation at present ? ?He is a full code at present ?

## 2022-03-13 NOTE — Progress Notes (Signed)
RT NOTE: ? ?Per CCM-NP order ETT needed to be advanced 3cm. Tube originally set at 24 and now is at 75. No complications were noted during advancement.  ?

## 2022-03-14 DIAGNOSIS — J9601 Acute respiratory failure with hypoxia: Secondary | ICD-10-CM | POA: Diagnosis not present

## 2022-03-14 LAB — GLUCOSE, CAPILLARY
Glucose-Capillary: 118 mg/dL — ABNORMAL HIGH (ref 70–99)
Glucose-Capillary: 118 mg/dL — ABNORMAL HIGH (ref 70–99)
Glucose-Capillary: 126 mg/dL — ABNORMAL HIGH (ref 70–99)
Glucose-Capillary: 134 mg/dL — ABNORMAL HIGH (ref 70–99)
Glucose-Capillary: 139 mg/dL — ABNORMAL HIGH (ref 70–99)
Glucose-Capillary: 141 mg/dL — ABNORMAL HIGH (ref 70–99)

## 2022-03-14 NOTE — Progress Notes (Signed)
? ?NAME:  Mark Yu, MRN:  [id number], DOB:  1956/07/08, LOS: 5 ?ADMISSION DATE:  03/08/2022, CONSULTATION DATE:  3/14 ?REFERRING MD: Dr. Loleta Books, CHIEF COMPLAINT: SOB  ? ?History of Present Illness:  ?66 yo male smoker presented to [facility] with dyspnea.  He has hx of severe COPD with emphysema and recent diagnosis of extensive stage SCLC on concurrent chemoradiation therapy.  Found to have rhinovirus.  Started on therapy for COPD exacerbation.  Had progressive hypoxia and transferred to SDU 3/13 to start on Bipap.  Respiratory status and mental status got worse, and he required intubation on 3/14.  PCCM assumed care in ICU. ? ?Pertinent  Medical History  ?Severe COPD with emphysema, Depression, Anxiety, HTN, extensive stage SCLC ?Concurrent systemic chemotherapy and radiation treatment ?Rhinovirus infection ? ?Significant Hospital Events: ?Including procedures, antibiotic start and stop dates in addition to other pertinent events   ?3/12 Admit with dyspnea, AECOPD in setting of rhinovirus  ?3/14 PCCM consulted for evaluation of respiratory failure. Intubated for airway protection  ?3/17-still on vent ?3/18 tolerating pressure support today ? ?Interim History / Subjective:  ?Tolerating weaning today ?Post radiation treatment 3/17 ?Off Precedex at present ? ?Objective   ?BP (!) 157/82   Pulse (!) 101   Temp 98.4 ?F (36.9 ?C) (Oral)   Resp (!) 24   Ht 5\' 11"  (1.803 m)   Wt 59.2 kg   SpO2 98%   BMI 18.20 kg/m?  ? ?Vent Mode: PSV;CPAP ?FiO2 (%):  [40 %] 40 % ?Set Rate:  [14 bmp] 14 bmp ?Vt Set:  [620 mL] 620 mL ?PEEP:  [5 cmH20] 5 cmH20 ?Pressure Support:  [8 cmH20] 8 cmH20 ?Plateau Pressure:  [15 XBM84-13 cmH20] 54 cmH20  ? ?Intake/Output Summary (Last 24 hours) at 03/14/2022 1255 ?Last data filed at 03/14/2022 1200 ?Gross per 24 hour  ?Intake 2800.69 ml  ?Output 910 ml  ?Net 1890.69 ml  ? ?Examination: ? ?General -sedated, appears comfortable, responsive ?Eyes -pupils reactive ?ENT -endotracheal tube in  place ?Cardiac -S1-S2 appreciated with no murmur ?Chest -decreased breath sounds bilaterally ?Abdomen -bowel sounds appreciated ?Extremities -no clubbing, no cyanosis ?Skin - no rashes ?Neuro -sedated, -1 RASS ? ?Resolved Hospital Problem list   ? ? ?Assessment & Plan:  ? ?Acute on chronic hypoxic/hypercapnic respiratory failure from rhinovirus pneumonia ?COPD exacerbation ?Extensive centrilobular and bullous emphysema ?Small cell lung cancer ?-Significantly reduced pulmonary reserve with extensive emphysema ?-Continue steroids ?Continue weaning as tolerated ? ?Metabolic encephalopathy ?History of depression and anxiety ?-Lighten sedation as tolerated ?-Off sedation currently and appears to be tolerating weaning ? ?Limited stage small cell lung cancer ?-On radiation therapy ?-S/p 1 cycle of chemotherapy with cisplatin/etoposide ?-Followed by Dr. Lisbeth Renshaw and Dr. Osborne Casco ? ?Protein calorie malnutrition ?Cachexia ?-Continue tube feeds ? ?Steroid-induced hyperglycemia ?-Continue glucose monitoring with goal of 1 40-1 80 ? ?Anemia critical illness ?-Continue to monitor closely ?-Transfuse per protocol if needed ? ?Updated patient's sister at bedside ? ?Not ready for extubation ? ?Continue to wean as tolerated ? ?Best Practice (right click and "Reselect all SmartList Selections" daily)  ?Diet/type: tubefeeds ?DVT prophylaxis: LMWH ?GI prophylaxis: PPI ?Lines: Central line > port/chronic  ?Foley:  N/A ?Code Status:  full code ?Last date of multidisciplinary goals of care discussion: updated pt's girlfriend on 3/14 ? ?Labs   ? ?CMP Latest Ref Rng & Units 03/13/2022 03/12/2022 03/11/2022  ?Glucose 70 - 99 mg/dL 123(H) 175(H) 182(H)  ?BUN 8 - 23 mg/dL 33(H) 37(H) 32(H)  ?Creatinine 0.61 - 1.24 mg/dL 0.35(L)  0.51(L) 0.48(L)  ?Sodium 135 - 145 mmol/L 141 138 140  ?Potassium 3.5 - 5.1 mmol/L 4.0 4.2 4.3  ?Chloride 98 - 111 mmol/L 105 104 106  ?CO2 22 - 32 mmol/L 30 27 28   ?Calcium 8.9 - 10.3 mg/dL 8.4(L) 8.7(L) 8.4(L)  ?Total  Protein 6.5 - 8.1 g/dL - - -  ?Total Bilirubin 0.3 - 1.2 mg/dL - - -  ?Alkaline Phos 38 - 126 U/L - - -  ?AST 15 - 41 U/L - - -  ?ALT 0 - 44 U/L - - -  ? ? ?CBC Latest Ref Rng & Units 03/13/2022 03/12/2022 03/11/2022  ?WBC 4.0 - 10.5 K/uL 7.3 10.2 9.6  ?Hemoglobin 13.0 - 17.0 g/dL 7.7(L) 8.7(L) 8.2(L)  ?Hematocrit 39.0 - 52.0 % 25.1(L) 28.1(L) 26.2(L)  ?Platelets 150 - 400 K/uL 324 352 305  ? ? ?CBG (last 3)  ?Recent Labs  ?  03/14/22 ?0400 03/14/22 ?0755 03/14/22 ?1204  ?GLUCAP 134* 126* 141*  ? ? ?ABG ?   ?Component Value Date/Time  ? PHART 7.36 03/10/2022 1050  ? PCO2ART 50 (H) 03/10/2022 1050  ? PO2ART 83 03/10/2022 1050  ? HCO3 28.2 (H) 03/10/2022 1050  ? ACIDBASEDEF 4.6 (H) 03/09/2022 1836  ? O2SAT 97 03/10/2022 1050  ? ?The patient is critically ill with multiple organ systems failure and requires high complexity decision making for assessment and support, frequent evaluation and titration of therapies, application of advanced monitoring technologies and extensive interpretation of multiple databases. Critical Care Time devoted to patient care services described in this note independent of APP/resident time (if applicable)  is 32 minutes.  ? ?Sherrilyn Rist MD ?[place] [facility] ?Personal pager: See Shea Evans ?If unanswered, please page ?CCM On-call: [phone] ? ? ? ? ?

## 2022-03-15 ENCOUNTER — Inpatient Hospital Stay (HOSPITAL_COMMUNITY): Payer: Medicare Other

## 2022-03-15 DIAGNOSIS — J9601 Acute respiratory failure with hypoxia: Secondary | ICD-10-CM | POA: Diagnosis not present

## 2022-03-15 LAB — COMPREHENSIVE METABOLIC PANEL
ALT: 19 U/L (ref 0–44)
AST: 13 U/L — ABNORMAL LOW (ref 15–41)
Albumin: 2.2 g/dL — ABNORMAL LOW (ref 3.5–5.0)
Alkaline Phosphatase: 54 U/L (ref 38–126)
Anion gap: 6 (ref 5–15)
BUN: 22 mg/dL (ref 8–23)
CO2: 31 mmol/L (ref 22–32)
Calcium: 8.4 mg/dL — ABNORMAL LOW (ref 8.9–10.3)
Chloride: 103 mmol/L (ref 98–111)
Creatinine, Ser: <0.3 mg/dL — ABNORMAL LOW (ref 0.61–1.24)
Glucose, Bld: 133 mg/dL — ABNORMAL HIGH (ref 70–99)
Potassium: 3.9 mmol/L (ref 3.5–5.1)
Sodium: 140 mmol/L (ref 135–145)
Total Bilirubin: 0.2 mg/dL — ABNORMAL LOW (ref 0.3–1.2)
Total Protein: 5.9 g/dL — ABNORMAL LOW (ref 6.5–8.1)

## 2022-03-15 LAB — GLUCOSE, CAPILLARY
Glucose-Capillary: 119 mg/dL — ABNORMAL HIGH (ref 70–99)
Glucose-Capillary: 129 mg/dL — ABNORMAL HIGH (ref 70–99)
Glucose-Capillary: 114 mg/dL — ABNORMAL HIGH (ref 70–99)
Glucose-Capillary: 162 mg/dL — ABNORMAL HIGH (ref 70–99)
Glucose-Capillary: 178 mg/dL — ABNORMAL HIGH (ref 70–99)
Glucose-Capillary: 167 mg/dL — ABNORMAL HIGH (ref 70–99)
Glucose-Capillary: 137 mg/dL — ABNORMAL HIGH (ref 70–99)

## 2022-03-15 LAB — CBC WITH DIFFERENTIAL/PLATELET
Abs Immature Granulocytes: 0.42 10*3/uL — ABNORMAL HIGH (ref 0.00–0.07)
Basophils Absolute: 0 10*3/uL (ref 0.0–0.1)
Basophils Relative: 0 %
Eosinophils Absolute: 0 10*3/uL (ref 0.0–0.5)
Eosinophils Relative: 0 %
HCT: 23.9 % — ABNORMAL LOW (ref 39.0–52.0)
Hemoglobin: 7.3 g/dL — ABNORMAL LOW (ref 13.0–17.0)
Immature Granulocytes: 4 %
Lymphocytes Relative: 4 %
Lymphs Abs: 0.4 10*3/uL — ABNORMAL LOW (ref 0.7–4.0)
MCH: 30.3 pg (ref 26.0–34.0)
MCHC: 30.5 g/dL (ref 30.0–36.0)
MCV: 99.2 fL (ref 80.0–100.0)
Monocytes Absolute: 0.9 10*3/uL (ref 0.1–1.0)
Monocytes Relative: 8 %
Neutro Abs: 8.5 10*3/uL — ABNORMAL HIGH (ref 1.7–7.7)
Neutrophils Relative %: 84 %
Platelets: 384 10*3/uL (ref 150–400)
RBC: 2.41 MIL/uL — ABNORMAL LOW (ref 4.22–5.81)
RDW: 20.3 % — ABNORMAL HIGH (ref 11.5–15.5)
WBC: 10.3 10*3/uL (ref 4.0–10.5)
nRBC: 0 % (ref 0.0–0.2)

## 2022-03-15 MED ORDER — METHYLPREDNISOLONE SODIUM SUCC 125 MG IJ SOLR
125.0000 mg | Freq: Once | INTRAMUSCULAR | Status: AC
  Administered 2022-03-15: 125 mg via INTRAVENOUS
  Filled 2022-03-15: qty 2

## 2022-03-15 MED ORDER — METHYLPREDNISOLONE SODIUM SUCC 125 MG IJ SOLR: 120.0000 mg | Freq: Once | INTRAMUSCULAR | Status: DC

## 2022-03-15 MED ORDER — IPRATROPIUM-ALBUTEROL 0.5-2.5 (3) MG/3ML IN SOLN
3.0000 mL | Freq: Once | RESPIRATORY_TRACT | Status: AC
  Administered 2022-03-15: 3 mL via RESPIRATORY_TRACT
  Filled 2022-03-15: qty 3

## 2022-03-15 NOTE — Progress Notes (Signed)
? ?NAME:  Mark Yu, MRN:  [id number], DOB:  09-Mar-1956, LOS: 6 ?ADMISSION DATE:  03/08/2022, CONSULTATION DATE:  3/14 ?REFERRING MD: Dr. Loleta Books, CHIEF COMPLAINT: SOB  ? ?History of Present Illness:  ?66 yo male smoker presented to [facility] with dyspnea.  He has hx of severe COPD with emphysema and recent diagnosis of extensive stage SCLC on concurrent chemoradiation therapy.  Found to have rhinovirus.  Started on therapy for COPD exacerbation.  Had progressive hypoxia and transferred to SDU 3/13 to start on Bipap.  Respiratory status and mental status got worse, and he required intubation on 3/14.  PCCM assumed care in ICU. ? ?Pertinent  Medical History  ?Severe COPD with emphysema, Depression, Anxiety, HTN, extensive stage SCLC ?Concurrent systemic chemotherapy and radiation treatment ?Rhinovirus infection ? ?Significant Hospital Events: ?Including procedures, antibiotic start and stop dates in addition to other pertinent events   ?3/12 Admit with dyspnea, AECOPD in setting of rhinovirus  ?3/14 PCCM consulted for evaluation of respiratory failure. Intubated for airway protection  ?3/17-still on vent ?3/18 tolerating pressure support today ?3/19 not weaning as well today with every episode of bronchospasms ? ?Interim History / Subjective:  ?Did tolerate weaning on 3/18 ?-Not weaning as well with evidence of bronchospasms, wheezing requiring nebulization treatments and steroids ?Post radiation treatment 3/17 ?Precedex had to be escalated ? ?Objective   ?BP (!) 150/91   Pulse (!) 115   Temp 99.1 ?F (37.3 ?C) (Axillary)   Resp 20   Ht 5\' 11"  (1.803 m)   Wt 59.1 kg   SpO2 97%   BMI 18.17 kg/m?  ? ?Vent Mode: PRVC ?FiO2 (%):  [40 %] 40 % ?Set Rate:  [14 bmp] 14 bmp ?Vt Set:  [620 mL] 620 mL ?PEEP:  [5 cmH20] 5 cmH20 ?Pressure Support:  [8 cmH20] 8 cmH20 ?Plateau Pressure:  [16 cmH20-23 cmH20] 16 cmH20  ? ?Intake/Output Summary (Last 24 hours) at 03/15/2022 1640 ?Last data filed at 03/15/2022 1400 ?Gross per 24 hour   ?Intake 166.96 ml  ?Output 1425 ml  ?Net -1258.04 ml  ? ?Examination: ? ?General -sedated, responsive ?Eyes -pupils reactive ?ENT -endotracheal tube in place ?Cardiac -S1-S2 appreciated with no murmur ?Chest -wheezing ?Abdomen -bowel sounds appreciated ?Extremities -no clubbing, no cyanosis ?Skin - no rashes ?Neuro -sedated, -1 RASS ? ?CBC/Chem-7 reviewed-significant for anemia ? ?Resolved Hospital Problem list   ? ? ?Assessment & Plan:  ? ?Acute on chronic hypoxic/hypercapnic respiratory failure from rhinovirus pneumonia ?COPD exacerbation ?Extensive centrilobular and bullous emphysema ?Small cell lung cancer ?-Significantly reduced pulmonary reserve with extensive emphysema ?-Continue steroids ?-Continue bronchodilators ?-Weaning as tolerated ? ?Metabolic encephalopathy ?History of depression and anxiety ?-Lighten sedation as tolerated ?-Requiring a little bit more sedation today secondary to bronchospasms ? ?Limited stage small cell lung cancer ?-On radiation therapy ?-S/p 1 cycle of chemotherapy with cisplatin/etoposide ?-Followed by Dr. Lisbeth Renshaw and Dr. Earlie Server ? ?Protein calorie malnutrition ?Cachexia ?-Continue tube feeds ? ?Anemia of critical illness ?-Continue to monitor closely ?-Transfuse per protocol if needed ? ?Continue to wean as tolerated ?-Did not tolerate weaning well today ?-Required steroids and start treatment with bronchodilators ?-Not ready for extubation ? ?Best Practice (right click and "Reselect all SmartList Selections" daily)  ?Diet/type: tubefeeds ?DVT prophylaxis: LMWH ?GI prophylaxis: PPI ?Lines: Central line > port/chronic  ?Foley:  N/A ?Code Status:  full code ?Last date of multidisciplinary goals of care discussion: updated pt's girlfriend on 3/14 ? ?Labs   ? ?CMP Latest Ref Rng & Units 03/15/2022 03/13/2022 03/12/2022  ?Glucose  70 - 99 mg/dL 133(H) 123(H) 175(H)  ?BUN 8 - 23 mg/dL 22 33(H) 37(H)  ?Creatinine 0.61 - 1.24 mg/dL <0.30(L) 0.35(L) 0.51(L)  ?Sodium 135 - 145 mmol/L 140 141  138  ?Potassium 3.5 - 5.1 mmol/L 3.9 4.0 4.2  ?Chloride 98 - 111 mmol/L 103 105 104  ?CO2 22 - 32 mmol/L 31 30 27   ?Calcium 8.9 - 10.3 mg/dL 8.4(L) 8.4(L) 8.7(L)  ?Total Protein 6.5 - 8.1 g/dL 5.9(L) - -  ?Total Bilirubin 0.3 - 1.2 mg/dL 0.2(L) - -  ?Alkaline Phos 38 - 126 U/L 54 - -  ?AST 15 - 41 U/L 13(L) - -  ?ALT 0 - 44 U/L 19 - -  ? ? ?CBC Latest Ref Rng & Units 03/15/2022 03/13/2022 03/12/2022  ?WBC 4.0 - 10.5 K/uL 10.3 7.3 10.2  ?Hemoglobin 13.0 - 17.0 g/dL 7.3(L) 7.7(L) 8.7(L)  ?Hematocrit 39.0 - 52.0 % 23.9(L) 25.1(L) 28.1(L)  ?Platelets 150 - 400 K/uL 384 324 352  ? ? ?CBG (last 3)  ?Recent Labs  ?  03/15/22 ?0202 03/15/22 ?0812 03/15/22 ?1210  ?GLUCAP 119* 114* 162*  ? ? ?ABG ?   ?Component Value Date/Time  ? PHART 7.36 03/10/2022 1050  ? PCO2ART 50 (H) 03/10/2022 1050  ? PO2ART 83 03/10/2022 1050  ? HCO3 28.2 (H) 03/10/2022 1050  ? ACIDBASEDEF 4.6 (H) 03/09/2022 1836  ? O2SAT 97 03/10/2022 1050  ? ?The patient is critically ill with multiple organ systems failure and requires high complexity decision making for assessment and support, frequent evaluation and titration of therapies, application of advanced monitoring technologies and extensive interpretation of multiple databases. Critical Care Time devoted to patient care services described in this note independent of APP/resident time (if applicable)  is 33 minutes.  ? ?Sherrilyn Rist MD ?[place] [facility] ?Personal pager: See Shea Evans ?If unanswered, please page ?CCM On-call: [phone] ? ? ?

## 2022-03-16 ENCOUNTER — Inpatient Hospital Stay: Payer: Medicare Other

## 2022-03-16 ENCOUNTER — Inpatient Hospital Stay (HOSPITAL_COMMUNITY): Payer: Medicare Other

## 2022-03-16 ENCOUNTER — Inpatient Hospital Stay: Payer: Medicare Other | Admitting: Internal Medicine

## 2022-03-16 ENCOUNTER — Ambulatory Visit
Admission: RE | Admit: 2022-03-16 | Discharge: 2022-03-16 | Disposition: A | Discharge status: Home / Self Care | Payer: Medicare Other | Source: Ambulatory Visit | Attending: Radiation Oncology | Admitting: Radiation Oncology

## 2022-03-16 DIAGNOSIS — J9601 Acute respiratory failure with hypoxia: Secondary | ICD-10-CM | POA: Diagnosis not present

## 2022-03-16 LAB — GLUCOSE, CAPILLARY
Glucose-Capillary: 110 mg/dL — ABNORMAL HIGH (ref 70–99)
Glucose-Capillary: 114 mg/dL — ABNORMAL HIGH (ref 70–99)
Glucose-Capillary: 119 mg/dL — ABNORMAL HIGH (ref 70–99)
Glucose-Capillary: 121 mg/dL — ABNORMAL HIGH (ref 70–99)
Glucose-Capillary: 128 mg/dL — ABNORMAL HIGH (ref 70–99)
Glucose-Capillary: 131 mg/dL — ABNORMAL HIGH (ref 70–99)

## 2022-03-16 LAB — BASIC METABOLIC PANEL
Anion gap: 3 — ABNORMAL LOW (ref 5–15)
BUN: 27 mg/dL — ABNORMAL HIGH (ref 8–23)
CO2: 32 mmol/L (ref 22–32)
Calcium: 8.6 mg/dL — ABNORMAL LOW (ref 8.9–10.3)
Chloride: 105 mmol/L (ref 98–111)
Creatinine, Ser: 0.31 mg/dL — ABNORMAL LOW (ref 0.61–1.24)
GFR, Estimated: >60 mL/min (ref >60)
Glucose, Bld: 120 mg/dL — ABNORMAL HIGH (ref 70–99)
Potassium: 4.2 mmol/L (ref 3.5–5.1)
Sodium: 140 mmol/L (ref 135–145)

## 2022-03-16 LAB — CBC WITH DIFFERENTIAL/PLATELET
Abs Immature Granulocytes: 0.4 10*3/uL — ABNORMAL HIGH (ref 0.00–0.07)
Basophils Absolute: 0 10*3/uL (ref 0.0–0.1)
Basophils Relative: 0 %
Eosinophils Absolute: 0 10*3/uL (ref 0.0–0.5)
Eosinophils Relative: 0 %
HCT: 21.3 % — ABNORMAL LOW (ref 39.0–52.0)
Hemoglobin: 6.6 g/dL — CL (ref 13.0–17.0)
Immature Granulocytes: 4 %
Lymphocytes Relative: 4 %
Lymphs Abs: 0.4 10*3/uL — ABNORMAL LOW (ref 0.7–4.0)
MCH: 31 pg (ref 26.0–34.0)
MCHC: 31 g/dL (ref 30.0–36.0)
MCV: 100 fL (ref 80.0–100.0)
Monocytes Absolute: 0.9 10*3/uL (ref 0.1–1.0)
Monocytes Relative: 9 %
Neutro Abs: 7.8 10*3/uL — ABNORMAL HIGH (ref 1.7–7.7)
Neutrophils Relative %: 83 %
Platelets: 391 10*3/uL (ref 150–400)
RBC: 2.13 MIL/uL — ABNORMAL LOW (ref 4.22–5.81)
RDW: 20.3 % — ABNORMAL HIGH (ref 11.5–15.5)
WBC: 9.5 10*3/uL (ref 4.0–10.5)
nRBC: 0 % (ref 0.0–0.2)

## 2022-03-16 LAB — HEMOGLOBIN AND HEMATOCRIT, BLOOD
HCT: 28.9 % — ABNORMAL LOW (ref 39.0–52.0)
Hemoglobin: 9 g/dL — ABNORMAL LOW (ref 13.0–17.0)

## 2022-03-16 LAB — PREPARE RBC (CROSSMATCH)

## 2022-03-16 LAB — ABO/RH: ABO/RH(D): O POS

## 2022-03-16 MED ORDER — ETOMIDATE 2 MG/ML IV SOLN
INTRAVENOUS | Status: AC
  Administered 2022-03-16: 20 mg via INTRAVENOUS
  Filled 2022-03-16: qty 20

## 2022-03-16 MED ORDER — ROCURONIUM BROMIDE 50 MG/5ML IV SOLN
40.0000 mg | Freq: Once | INTRAVENOUS | Status: AC
  Filled 2022-03-16: qty 4

## 2022-03-16 MED ORDER — MIDAZOLAM HCL 2 MG/2ML IJ SOLN: 1.0000 mg | Freq: Once | INTRAMUSCULAR | Status: AC

## 2022-03-16 MED ORDER — ETOMIDATE 2 MG/ML IV SOLN: 20.0000 mg | Freq: Once | INTRAVENOUS | Status: AC

## 2022-03-16 MED ORDER — MIDAZOLAM HCL 2 MG/2ML IJ SOLN
INTRAMUSCULAR | Status: AC
Start: 2022-03-16 — End: 2022-03-16
  Administered 2022-03-16: 1 mg via INTRAVENOUS
  Filled 2022-03-16: qty 2

## 2022-03-16 MED ORDER — FENTANYL CITRATE (PF) 100 MCG/2ML IJ SOLN: 50.0000 ug | Freq: Once | INTRAMUSCULAR | Status: AC

## 2022-03-16 MED ORDER — SODIUM CHLORIDE 0.9% IV SOLUTION: Freq: Once | INTRAVENOUS | Status: AC

## 2022-03-16 MED ORDER — ROCURONIUM BROMIDE 10 MG/ML (PF) SYRINGE
PREFILLED_SYRINGE | INTRAVENOUS | Status: AC
  Administered 2022-03-16: 40 mg via INTRAVENOUS
  Filled 2022-03-16: qty 10

## 2022-03-16 MED ORDER — FENTANYL CITRATE (PF) 100 MCG/2ML IJ SOLN
INTRAMUSCULAR | Status: AC
  Administered 2022-03-16: 50 ug via INTRAVENOUS
  Filled 2022-03-16: qty 2

## 2022-03-16 NOTE — Plan of Care (Signed)
?  Problem: Respiratory: ?Goal: Levels of oxygenation will improve ?Outcome: Progressing ?  ?Problem: Clinical Measurements: ?Goal: Will remain free from infection ?Outcome: Progressing ?Goal: Cardiovascular complication will be avoided ?Outcome: Progressing ?  ?Problem: Nutrition: ?Goal: Adequate nutrition will be maintained ?Outcome: Progressing ?  ?

## 2022-03-16 NOTE — Progress Notes (Signed)
? ?NAME:  Mark Yu, MRN:  [id number], DOB:  [birth year], LOS: 7 ?ADMISSION DATE:  03/08/2022, CONSULTATION DATE:  3/14 ?REFERRING MD: Dr. Loleta Books, CHIEF COMPLAINT: SOB  ? ?History of Present Illness:  ?66 yo male smoker presented to [facility] with dyspnea.  He has hx of severe COPD with emphysema and recent diagnosis of extensive stage SCLC on concurrent chemoradiation therapy.  Found to have rhinovirus.  Started on therapy for COPD exacerbation.  Had progressive hypoxia and transferred to SDU 3/13 to start on Bipap.  Respiratory status and mental status got worse, and he required intubation on 3/14.  PCCM assumed care in ICU. ? ?Pertinent  Medical History  ?Severe COPD with emphysema, Depression, Anxiety, HTN, extensive stage SCLC ?Concurrent systemic chemotherapy and radiation treatment ?Rhinovirus infection ? ?Significant Hospital Events: ?Including procedures, antibiotic start and stop dates in addition to other pertinent events   ?3/12 Admit with dyspnea, AECOPD in setting of rhinovirus  ?3/14 PCCM consulted for evaluation of respiratory failure. Intubated for airway protection  ?3/17-still on vent ?3/18 tolerating pressure support today ?3/19 not weaning as well today with every episode of bronchospasms ?3/20 ordered to transfuse for hemoglobin of 6.6 ? ?Interim History / Subjective:  ?Did tolerate weaning on 3/18 ?Not able to wean on 3/19 secondary to bronchospasms ?Post radiation treatment 3/17 ?Precedex had to be escalated ?Still a little wheezy this morning ? ?Objective   ?BP 123/76   Pulse 90   Temp (!) 97.4 ?F (36.3 ?C) (Axillary)   Resp (!) 22   Ht 5\' 11"  (1.803 m)   Wt 59 kg   SpO2 100%   BMI 18.14 kg/m?  ? ?Vent Mode: PSV;CPAP ?FiO2 (%):  [30 %-40 %] 30 % ?Set Rate:  [14 bmp] 14 bmp ?Vt Set:  [620 mL] 620 mL ?PEEP:  [5 cmH20] 5 cmH20 ?Pressure Support:  [5 cmH20] 5 cmH20 ?Plateau Pressure:  [15 BOF75-10 cmH20] 15 cmH20  ? ?Intake/Output Summary (Last 24 hours) at 03/16/2022 2585 ?Last data filed  at 03/16/2022 0800 ?Gross per 24 hour  ?Intake 2470.52 ml  ?Output 1002 ml  ?Net 1468.52 ml  ? ?Examination: ? ?General -sedated, responsive, easily arousable ?Eyes -pupils reactive ?ENT -endotracheal tube in place ?Cardiac -S1-S2 appreciated with no murmur ?Chest -bilateral wheezing, poor air movement ?Abdomen -bowel sounds appreciated ?Extremities -no clubbing, no cyanosis ?Skin - no rashes ?Neuro --1 RASS ? ?CBC/Chem-7 reviewed-significant for anemia ?Chest x-ray reviewed by myself showing a right lower lobe infiltrate ? ?Resolved Hospital Problem list   ? ? ?Assessment & Plan:  ? ?Acute on chronic hypoxic/hypercapnic respiratory failure from rhinovirus pneumonia ?COPD exacerbation ?Extensive centrilobular and bullous emphysema ?Small cell lung cancer ?-Patient with significantly reduced pulmonary reserve with extensive emphysema ?-Continue weaning as tolerated ?-Continue bronchodilators ?-Continue steroids ? ?Right lower lobe infiltrate ?-May be related to underlying viral pneumonia ?-Obtain tracheal aspirate ?-Has no significant leukocytosis or new fevers-continue to monitor ? ?Metabolic encephalopathy ?History of depression anxiety ?-Lighten sedation as tolerated ? ?Limited stage small cell lung cancer ?-On radiation therapy ?-S/p 1 cycle of chemotherapy with cisplatin/etoposide ?-Followed by Dr. Lisbeth Renshaw and Earlie Server ? ?Protein calorie malnutrition ?Cachexia ?-Continue tube feeds ? ?Anemia critical illness ?No evidence of bleeding noted ?-Transfuse per protocol ? ?Not tolerating weaning well ?Continue to support aggressively ?Continue bronchodilators ? ? ?Best Practice (right click and "Reselect all SmartList Selections" daily)  ?Diet/type: tubefeeds ?DVT prophylaxis: LMWH ?GI prophylaxis: PPI ?Lines: Central line > port/chronic  ?Foley:  N/A ?Code Status:  full code ?Last  date of multidisciplinary goals of care discussion: updated pt's girlfriend on 3/14 ? ?Labs   ? ?CMP Latest Ref Rng & Units 03/16/2022  03/15/2022 03/13/2022  ?Glucose 70 - 99 mg/dL 120(H) 133(H) 123(H)  ?BUN 8 - 23 mg/dL 27(H) 22 33(H)  ?Creatinine 0.61 - 1.24 mg/dL 0.31(L) <0.30(L) 0.35(L)  ?Sodium 135 - 145 mmol/L 140 140 141  ?Potassium 3.5 - 5.1 mmol/L 4.2 3.9 4.0  ?Chloride 98 - 111 mmol/L 105 103 105  ?CO2 22 - 32 mmol/L 32 31 30  ?Calcium 8.9 - 10.3 mg/dL 8.6(L) 8.4(L) 8.4(L)  ?Total Protein 6.5 - 8.1 g/dL - 5.9(L) -  ?Total Bilirubin 0.3 - 1.2 mg/dL - 0.2(L) -  ?Alkaline Phos 38 - 126 U/L - 54 -  ?AST 15 - 41 U/L - 13(L) -  ?ALT 0 - 44 U/L - 19 -  ? ? ?CBC Latest Ref Rng & Units 03/16/2022 03/15/2022 03/13/2022  ?WBC 4.0 - 10.5 K/uL 9.5 10.3 7.3  ?Hemoglobin 13.0 - 17.0 g/dL 6.6(LL) 7.3(L) 7.7(L)  ?Hematocrit 39.0 - 52.0 % 21.3(L) 23.9(L) 25.1(L)  ?Platelets 150 - 400 K/uL 391 384 324  ? ? ?CBG (last 3)  ?Recent Labs  ?  03/15/22 ?2325 03/16/22 ?0320 03/16/22 ?4827  ?GLUCAP 137* 119* 110*  ? ? ?ABG ?   ?Component Value Date/Time  ? PHART 7.36 03/10/2022 1050  ? PCO2ART 50 (H) 03/10/2022 1050  ? PO2ART 83 03/10/2022 1050  ? HCO3 28.2 (H) 03/10/2022 1050  ? ACIDBASEDEF 4.6 (H) 03/09/2022 1836  ? O2SAT 97 03/10/2022 1050  ? ?The patient is critically ill with multiple organ systems failure and requires high complexity decision making for assessment and support, frequent evaluation and titration of therapies, application of advanced monitoring technologies and extensive interpretation of multiple databases. Critical Care Time devoted to patient care services described in this note independent of APP/resident time (if applicable)  is 31 minutes.  ? ?Sherrilyn Rist MD ?[facility] ?Personal pager: See Shea Evans ?If unanswered, please page ?CCM On-call: [phone] ? ?

## 2022-03-16 NOTE — TOC Progression Note (Signed)
Transition of Care (TOC) - Progression Note  ? ? ?Patient Details  ?Name: Haziel Molner ?MRN: [id number] ?Date of Birth: 1956-07-24 ? ?Transition of Care (TOC) CM/SW Contact  ?Ross Ludwig, LCSW ?Phone Number: ?03/16/2022, 3:24 PM ? ?Clinical Narrative:    ? ?TOC continuing to follow patient's progress throughout discharge planning. ? ?Expected Discharge Plan: Home/Self Care ?Barriers to Discharge: Continued Medical Work up ? ?Expected Discharge Plan and Services ?Expected Discharge Plan: Home/Self Care ?  ?Discharge Planning Services: CM Consult ?  ?Living arrangements for the past 2 months: [place] ?                ?  ?  ?  ?  ?  ?  ?  ?  ?  ?  ? ? ?Social Determinants of Health (SDOH) Interventions ?  ? ?Readmission Risk Interventions ?Readmission Risk Prevention Plan 03/09/2022  ?Transportation Screening Complete  ?PCP or Specialist Appt within 3-5 Days Complete  ?[place] or Home Care Consult Complete  ?Social Work Consult for [place] Planning/Counseling Complete  ?Medication Review Press photographer) Complete  ? ? ?

## 2022-03-16 NOTE — Procedures (Signed)
Intubation Procedure Note ? ?Mark Yu  ?[id number]  ?Apr 02, 1956 ? ?Date:03/16/22  ?Time:3:44 PM  ? ?Provider Performing:Abbas Beyene A Yarexi Pawlicki  ? ? ?Procedure: Intubation (63785) ? ?Indication(s) ?Respiratory Failure ? ?Consent ?Unable to obtain consent due to inability to find a medical decision maker for patient.  All reasonable efforts were made.  Another independent medical provider,   , confirmed the benefits of this procedure outweigh the risks. ? ? ?Anesthesia ?Etomidate, Versed, Fentanyl, and Rocuronium ?10 of etomidate,1 of Versed, 50 of fentanyl, 40 of rocuronium ? ?Time Out ?Verified patient identification, verified procedure, site/side was marked, verified correct patient position, special equipment/implants available, medications/allergies/relevant history reviewed, required imaging and test results available. ? ? ?Sterile Technique ?Usual hand hygeine, masks, and gloves were used ? ? ?Procedure Description ?Patient positioned in bed supine.  Sedation given as noted above.  Patient was intubated with endotracheal tube using Glidescope.  View was Grade 1 full glottis .  Number of attempts was 1.  Colorimetric CO2 detector was consistent with tracheal placement. ? ? ?Complications/Tolerance ?None; patient tolerated the procedure well. ?Chest X-ray is ordered to verify placement. ? ? ?EBL ?none ? ? ?Specimen(s) ?None ? ?

## 2022-03-16 NOTE — Progress Notes (Signed)
Patient extubated this afternoon ? ?Soon after noted to be saturating in the 60s despite oxygen supplementation ?He is arousable ?Very poor cough ? ?Very poor air movement on auscultation ? ?I did ask him several times to give me a good cough and could not clear secretions ? ?Decision made to reintubate ?

## 2022-03-16 NOTE — Progress Notes (Signed)
eLink Physician-Brief Progress Note ?Patient Name: Mark Yu ?DOB: [birth year] ?MRN: [id number] ? ? ?Date of Service ? 03/16/2022  ?HPI/Events of Note ? Hemoglobin 6.6 on AM labs.  ?Pt with downtrending hemoglobin, had been 7.6 yesterday.  ?Pt neither hypotensive nor tachycardic.  ?No obvious source of bleed noted by bedside RN.   ?eICU Interventions ? Placed order to transfuse 1 unit pRBC.   ? ? ? ?  ? ?[place] ?03/16/2022, 6:00 AM ?

## 2022-03-16 NOTE — Progress Notes (Signed)
Collected and delivered (tube system) ordered sputum.  ?

## 2022-03-17 ENCOUNTER — Inpatient Hospital Stay: Payer: Medicare Other

## 2022-03-17 ENCOUNTER — Ambulatory Visit
Admission: RE | Admit: 2022-03-17 | Discharge: 2022-03-17 | Disposition: A | Discharge status: Home / Self Care | Payer: Medicare Other | Source: Ambulatory Visit | Attending: Radiation Oncology | Admitting: Radiation Oncology

## 2022-03-17 DIAGNOSIS — J9601 Acute respiratory failure with hypoxia: Secondary | ICD-10-CM | POA: Diagnosis not present

## 2022-03-17 LAB — BASIC METABOLIC PANEL
Anion gap: 4 — ABNORMAL LOW (ref 5–15)
BUN: 29 mg/dL — ABNORMAL HIGH (ref 8–23)
CO2: 33 mmol/L — ABNORMAL HIGH (ref 22–32)
Calcium: 8.4 mg/dL — ABNORMAL LOW (ref 8.9–10.3)
Chloride: 104 mmol/L (ref 98–111)
Creatinine, Ser: <0.3 mg/dL — ABNORMAL LOW (ref 0.61–1.24)
Glucose, Bld: 114 mg/dL — ABNORMAL HIGH (ref 70–99)
Potassium: 4 mmol/L (ref 3.5–5.1)
Sodium: 141 mmol/L (ref 135–145)

## 2022-03-17 LAB — TYPE AND SCREEN
ABO/RH(D): O POS
Antibody Screen: NEGATIVE
Unit division: 0

## 2022-03-17 LAB — GLUCOSE, CAPILLARY
Glucose-Capillary: 142 mg/dL — ABNORMAL HIGH (ref 70–99)
Glucose-Capillary: 109 mg/dL — ABNORMAL HIGH (ref 70–99)
Glucose-Capillary: 134 mg/dL — ABNORMAL HIGH (ref 70–99)
Glucose-Capillary: 141 mg/dL — ABNORMAL HIGH (ref 70–99)
Glucose-Capillary: 132 mg/dL — ABNORMAL HIGH (ref 70–99)
Glucose-Capillary: 113 mg/dL — ABNORMAL HIGH (ref 70–99)

## 2022-03-17 LAB — CBC
HCT: 25.1 % — ABNORMAL LOW (ref 39.0–52.0)
Hemoglobin: 7.9 g/dL — ABNORMAL LOW (ref 13.0–17.0)
MCH: 31 pg (ref 26.0–34.0)
MCHC: 31.5 g/dL (ref 30.0–36.0)
MCV: 98.4 fL (ref 80.0–100.0)
Platelets: 391 10*3/uL (ref 150–400)
RBC: 2.55 MIL/uL — ABNORMAL LOW (ref 4.22–5.81)
RDW: 20 % — ABNORMAL HIGH (ref 11.5–15.5)
WBC: 9.2 10*3/uL (ref 4.0–10.5)
nRBC: 0 % (ref 0.0–0.2)

## 2022-03-17 LAB — MAGNESIUM: Magnesium: 2.1 mg/dL (ref 1.7–2.4)

## 2022-03-17 LAB — BPAM RBC
Blood Product Expiration Date: 202304162359
ISSUE DATE / TIME: 202303201105
Unit Type and Rh: 5100

## 2022-03-17 LAB — PHOSPHORUS: Phosphorus: 3.7 mg/dL (ref 2.5–4.6)

## 2022-03-17 MED ORDER — AMLODIPINE BESYLATE 5 MG PO TABS
2.5000 mg | ORAL_TABLET | Freq: Every day | ORAL | Status: DC
  Administered 2022-03-19 – 2022-04-17 (×29): 2.5 mg
  Filled 2022-03-17 (×30): qty 1

## 2022-03-17 MED ORDER — DEXMEDETOMIDINE HCL IN NACL 400 MCG/100ML IV SOLN
0.4000 ug/kg/h | INTRAVENOUS | Status: DC
  Administered 2022-03-17 – 2022-03-18 (×2): 1 ug/kg/h via INTRAVENOUS
  Filled 2022-03-17: qty 100

## 2022-03-17 NOTE — Progress Notes (Signed)
? ?NAME:  Hanish Laraia, MRN:  [id number], DOB:  1956/09/03, LOS: 8 ?ADMISSION DATE:  03/08/2022, CONSULTATION DATE:  3/14 ?REFERRING MD: Dr. Loleta Books, CHIEF COMPLAINT: SOB  ? ?History of Present Illness:  ?66 yo male smoker presented to [facility] with dyspnea.  He has hx of severe COPD with emphysema and recent diagnosis of extensive stage SCLC on concurrent chemoradiation therapy.  Found to have rhinovirus.  Started on therapy for COPD exacerbation.  Had progressive hypoxia and transferred to SDU 3/13 to start on Bipap.  Respiratory status and mental status got worse, and he required intubation on 3/14.  PCCM assumed care in ICU. ? ?Pertinent  Medical History  ?Severe COPD with emphysema, Depression, Anxiety, HTN, extensive stage SCLC ?Concurrent systemic chemotherapy and radiation treatment ?Rhinovirus infection ? ?Significant Hospital Events: ?Including procedures, antibiotic start and stop dates in addition to other pertinent events   ?3/12 Admit with dyspnea, AECOPD in setting of rhinovirus  ?3/14 PCCM consulted for evaluation of respiratory failure. Intubated for airway protection  ?3/17 On vent, XRT  ?3/18 tolerating pressure support today ?3/19 not weaning as well today with every episode of bronchospasms ?3/20 PRBC for hgb of 6.6, PSV wean & extubated.  Failed due to global weakness/inability to clear secretions > reintubated.  ? ?Interim History / Subjective:  ?Afebrile  ?Respiratory culture pending  ?Vent - 35%, PEEP 5  ?I/O 1L UOP, +2.1L in last 24 hours  ?Pt awake watching cartoons on TV, denies distress  ? ?Objective   ?BP 118/66   Pulse 72   Temp (!) 97.4 ?F (36.3 ?C) (Axillary)   Resp 17   Ht 5\' 11"  (1.803 m)   Wt 59 kg   SpO2 99%   BMI 18.14 kg/m?  ? ?Vent Mode: PRVC ?FiO2 (%):  [30 %-100 %] 40 % ?Set Rate:  [14 bmp] 14 bmp ?Vt Set:  [620 mL] 620 mL ?PEEP:  [5 cmH20] 5 cmH20 ?Pressure Support:  [5 cmH20-8 cmH20] 8 cmH20 ?Plateau Pressure:  [14 cmH20-27 cmH20] 14 cmH20  ? ?Intake/Output Summary  (Last 24 hours) at 03/17/2022 0741 ?Last data filed at 03/16/2022 2336 ?Gross per 24 hour  ?Intake 3107.78 ml  ?Output 1050 ml  ?Net 2057.78 ml  ? ?Examination: ?General: cachectic adult male lying in bed in NAD on vent   ?HEENT: MM pink/moist, ETT, anicteric, pupils reactive ?Neuro: awake on vent, nods/interacts appropriately, generalized weakness ?CV: s1s2 RRR, no m/r/g ?PULM: non-labored on vent, lungs bilaterally with rhonchi, suctioned and largely cleared with residual wheezing ?GI: soft, bsx4 active  ?Extremities: warm/dry, dependent 1+ edema  ?Skin: no rashes or lesions ? ?Resolved Hospital Problem list   ? ? ?Assessment & Plan:  ? 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  ?-PRVC 8cc/kg as rest mode ?-ok for PSV as tolerated during day ?-follow intermittent CXR  ?-continue brovana + pulmicort  ?-prednisone 30 mg PT  ?-once ready for next extubation trial, consider extubation to bipap if secretion burden improved ? ?Right lower lobe infiltrate ?May be related to underlying viral pneumonia ?-follow tracheal aspirate  ?-no significant leukocytosis or fever ? ?Metabolic encephalopathy ?History of depression anxiety ?-limit sedation as tolerated  ?-RASS Goal 0 to -1  ? ?Limited stage small cell lung cancer ?On radiation therapy. S/p 1 cycle of chemo with cisplatin/etoposide  ?-followed by Dr. Lisbeth Renshaw & Dr.  Mohammed  ? ?Protein calorie malnutrition ?Cachexia ?-TF per Nutrition  ?-follow electrolytes  ? ?Anemia Critical illness ?No evidence of bleeding noted ?-trend CBC, transfuse for Hgb <7% or active bleeding  ? ?HTN  ?-hold norvasc 3/21, & reduce norvasc to 2.5 mg  ? ?Best Practice (right click and "Reselect all SmartList Selections" daily)  ?Diet/type: tubefeeds ?DVT prophylaxis:  LMWH ?GI prophylaxis: PPI ?Lines: Central line > port/chronic  ?Foley:  N/A ?Code Status:  full code ?Last date of multidisciplinary goals of care discussion: updated pt's girlfriend on 3/14 at bedside.  ? ?Sister, Daemon Dowty, called for update 3/21. Extensive discussion with sister regarding patients clinical status.  Reviewed his prior medical issues before admission to include very severe lung disease, acute viral illness that led to admission.  We discussed his failure after extubation on 3/20.  We reviewed his prior health issues may be larger barrier in his liberation from mechanical ventilation. We discussed that our goal is to successfully liberate him from the vent.  We reviewed the concept of tracheostomy and how Steve's career revolves around his voice.  This may not line up with what he would want for his life.  Asked [name] to discuss further with family about the "what if scenarios" of CPR, trach, prolonged critical illness.  Sister states "the cancer prognosis we were given was good but that may not be the larger issues here".  She was thankful for time and discussion.  Will need ongoing discussion regarding plan.  Continue full support with goal for liberation from vent for now.  ? ?Critical Care Time: 36 minutes  ?  ?Noe Gens, MSN, APRN, NP-C, AGACNP-BC ?[facility] ?03/17/2022, 7:41 AM ? ? ?Please see [email or web address] for pager details.  ? ?From 7A-7P if no response, please call [phone] ?After hours, please call Warren Lacy [phone] ? ? ?

## 2022-03-18 ENCOUNTER — Ambulatory Visit
Admission: RE | Admit: 2022-03-18 | Discharge: 2022-03-18 | Disposition: A | Discharge status: Home / Self Care | Payer: Medicare Other | Source: Ambulatory Visit | Attending: Radiation Oncology | Admitting: Radiation Oncology

## 2022-03-18 ENCOUNTER — Inpatient Hospital Stay (HOSPITAL_COMMUNITY): Payer: Medicare Other | Admitting: Registered Nurse

## 2022-03-18 ENCOUNTER — Inpatient Hospital Stay (HOSPITAL_COMMUNITY): Payer: Medicare Other

## 2022-03-18 ENCOUNTER — Encounter (HOSPITAL_COMMUNITY): Payer: Self-pay | Admitting: Family Medicine

## 2022-03-18 ENCOUNTER — Inpatient Hospital Stay: Payer: Medicare Other

## 2022-03-18 DIAGNOSIS — F17210 Nicotine dependence, cigarettes, uncomplicated: Secondary | ICD-10-CM

## 2022-03-18 DIAGNOSIS — R590 Localized enlarged lymph nodes: Secondary | ICD-10-CM

## 2022-03-18 DIAGNOSIS — F32A Depression, unspecified: Secondary | ICD-10-CM

## 2022-03-18 DIAGNOSIS — J449 Chronic obstructive pulmonary disease, unspecified: Secondary | ICD-10-CM

## 2022-03-18 DIAGNOSIS — J441 Chronic obstructive pulmonary disease with (acute) exacerbation: Secondary | ICD-10-CM | POA: Diagnosis not present

## 2022-03-18 DIAGNOSIS — C3412 Malignant neoplasm of upper lobe, left bronchus or lung: Secondary | ICD-10-CM

## 2022-03-18 DIAGNOSIS — J9601 Acute respiratory failure with hypoxia: Secondary | ICD-10-CM | POA: Diagnosis not present

## 2022-03-18 DIAGNOSIS — R918 Other nonspecific abnormal finding of lung field: Secondary | ICD-10-CM

## 2022-03-18 DIAGNOSIS — I10 Essential (primary) hypertension: Secondary | ICD-10-CM

## 2022-03-18 DIAGNOSIS — J9690 Respiratory failure, unspecified, unspecified whether with hypoxia or hypercapnia: Secondary | ICD-10-CM

## 2022-03-18 DIAGNOSIS — D6481 Anemia due to antineoplastic chemotherapy: Secondary | ICD-10-CM | POA: Diagnosis not present

## 2022-03-18 DIAGNOSIS — E43 Unspecified severe protein-calorie malnutrition: Secondary | ICD-10-CM

## 2022-03-18 DIAGNOSIS — R5381 Other malaise: Secondary | ICD-10-CM

## 2022-03-18 DIAGNOSIS — C779 Secondary and unspecified malignant neoplasm of lymph node, unspecified: Secondary | ICD-10-CM

## 2022-03-18 DIAGNOSIS — F419 Anxiety disorder, unspecified: Secondary | ICD-10-CM

## 2022-03-18 LAB — CBC
HCT: 23.9 % — ABNORMAL LOW (ref 39.0–52.0)
HCT: 33.2 % — ABNORMAL LOW (ref 39.0–52.0)
Hemoglobin: 7.5 g/dL — ABNORMAL LOW (ref 13.0–17.0)
Hemoglobin: 10.3 g/dL — ABNORMAL LOW (ref 13.0–17.0)
MCH: 31.1 pg (ref 26.0–34.0)
MCH: 30.6 pg (ref 26.0–34.0)
MCHC: 31.4 g/dL (ref 30.0–36.0)
MCHC: 31 g/dL (ref 30.0–36.0)
MCV: 99.2 fL (ref 80.0–100.0)
MCV: 98.5 fL (ref 80.0–100.0)
Platelets: 385 10*3/uL (ref 150–400)
Platelets: 761 10*3/uL — ABNORMAL HIGH (ref 150–400)
RBC: 2.41 MIL/uL — ABNORMAL LOW (ref 4.22–5.81)
RBC: 3.37 MIL/uL — ABNORMAL LOW (ref 4.22–5.81)
RDW: 19.2 % — ABNORMAL HIGH (ref 11.5–15.5)
RDW: 19.2 % — ABNORMAL HIGH (ref 11.5–15.5)
WBC: 9.2 10*3/uL (ref 4.0–10.5)
WBC: 22.8 10*3/uL — ABNORMAL HIGH (ref 4.0–10.5)
nRBC: 0 % (ref 0.0–0.2)
nRBC: 0 % (ref 0.0–0.2)

## 2022-03-18 LAB — BASIC METABOLIC PANEL
Anion gap: 6 (ref 5–15)
Anion gap: 8 (ref 5–15)
BUN: 26 mg/dL — ABNORMAL HIGH (ref 8–23)
BUN: 23 mg/dL (ref 8–23)
CO2: 32 mmol/L (ref 22–32)
CO2: 30 mmol/L (ref 22–32)
Calcium: 8.5 mg/dL — ABNORMAL LOW (ref 8.9–10.3)
Calcium: 8.9 mg/dL (ref 8.9–10.3)
Chloride: 102 mmol/L (ref 98–111)
Chloride: 104 mmol/L (ref 98–111)
Creatinine, Ser: 0.35 mg/dL — ABNORMAL LOW (ref 0.61–1.24)
Creatinine, Ser: 0.38 mg/dL — ABNORMAL LOW (ref 0.61–1.24)
GFR, Estimated: >60 mL/min (ref >60)
GFR, Estimated: >60 mL/min (ref >60)
Glucose, Bld: 133 mg/dL — ABNORMAL HIGH (ref 70–99)
Glucose, Bld: 129 mg/dL — ABNORMAL HIGH (ref 70–99)
Potassium: 3.6 mmol/L (ref 3.5–5.1)
Potassium: 4 mmol/L (ref 3.5–5.1)
Sodium: 140 mmol/L (ref 135–145)
Sodium: 142 mmol/L (ref 135–145)

## 2022-03-18 LAB — BLOOD GAS, ARTERIAL
Acid-Base Excess: 4.9 mmol/L — ABNORMAL HIGH (ref 0.0–2.0)
Acid-Base Excess: 4.3 mmol/L — ABNORMAL HIGH (ref 0.0–2.0)
Bicarbonate: 33 mmol/L — ABNORMAL HIGH (ref 20.0–28.0)
Bicarbonate: 31.8 mmol/L — ABNORMAL HIGH (ref 20.0–28.0)
Delivery systems: POSITIVE
Drawn by: 42261
Drawn by: 42261
Expiratory PAP: 5 cmH2O
FIO2: 50 %
Inspiratory PAP: 13 cmH2O
O2 Saturation: 86.1 %
O2 Saturation: 100 %
PEEP: 5 cmH2O
Patient temperature: 36.3
Patient temperature: 36.6
RATE: 14 resp/min
Spontaneous VT: 620 mL
pCO2 arterial: 62 mmHg — ABNORMAL HIGH (ref 32–48)
pCO2 arterial: 58 mmHg — ABNORMAL HIGH (ref 32–48)
pH, Arterial: 7.33 — ABNORMAL LOW (ref 7.35–7.45)
pH, Arterial: 7.35 (ref 7.35–7.45)
pO2, Arterial: 54 mmHg — ABNORMAL LOW (ref 83–108)
pO2, Arterial: 350 mmHg — ABNORMAL HIGH (ref 83–108)

## 2022-03-18 LAB — GLUCOSE, CAPILLARY
Glucose-Capillary: 104 mg/dL — ABNORMAL HIGH (ref 70–99)
Glucose-Capillary: 107 mg/dL — ABNORMAL HIGH (ref 70–99)
Glucose-Capillary: 134 mg/dL — ABNORMAL HIGH (ref 70–99)
Glucose-Capillary: 137 mg/dL — ABNORMAL HIGH (ref 70–99)
Glucose-Capillary: 91 mg/dL (ref 70–99)
Glucose-Capillary: 94 mg/dL (ref 70–99)

## 2022-03-18 LAB — MAGNESIUM: Magnesium: 2.2 mg/dL (ref 1.7–2.4)

## 2022-03-18 LAB — PROCALCITONIN: Procalcitonin: 0.11 ng/mL

## 2022-03-18 MED ORDER — DEXMEDETOMIDINE HCL IN NACL 200 MCG/50ML IV SOLN
INTRAVENOUS | Status: AC
  Filled 2022-03-18: qty 50

## 2022-03-18 MED ORDER — MORPHINE SULFATE (PF) 2 MG/ML IV SOLN
1.0000 mg | INTRAVENOUS | Status: DC | PRN
  Administered 2022-03-18: 1 mg via INTRAVENOUS
  Filled 2022-03-18 (×2): qty 1

## 2022-03-18 MED ORDER — SUCCINYLCHOLINE CHLORIDE 200 MG/10ML IV SOSY
PREFILLED_SYRINGE | INTRAVENOUS | Status: DC | PRN
Start: 2022-03-18 — End: 2022-03-18
  Administered 2022-03-18: 100 mg via INTRAVENOUS

## 2022-03-18 MED ORDER — SODIUM CHLORIDE 0.9 % IV SOLN: INTRAVENOUS | Status: DC | PRN

## 2022-03-18 MED ORDER — MIDAZOLAM HCL 2 MG/2ML IJ SOLN
INTRAMUSCULAR | Status: AC
  Filled 2022-03-18: qty 2

## 2022-03-18 MED ORDER — FENTANYL 2500MCG IN NS 250ML (10MCG/ML) PREMIX INFUSION
INTRAVENOUS | Status: AC
  Filled 2022-03-18: qty 250

## 2022-03-18 MED ORDER — SUCCINYLCHOLINE CHLORIDE 200 MG/10ML IV SOSY
PREFILLED_SYRINGE | INTRAVENOUS | Status: AC
  Filled 2022-03-18: qty 10

## 2022-03-18 MED ORDER — FENTANYL CITRATE (PF) 100 MCG/2ML IJ SOLN
25.0000 ug | INTRAMUSCULAR | Status: DC | PRN
  Administered 2022-03-19: 50 ug via INTRAVENOUS
  Administered 2022-03-19: 100 ug via INTRAVENOUS
  Administered 2022-03-23: 50 ug via INTRAVENOUS
  Administered 2022-03-25 (×2): 100 ug via INTRAVENOUS
  Administered 2022-03-26 (×3): 50 ug via INTRAVENOUS
  Administered 2022-03-27 – 2022-03-29 (×4): 100 ug via INTRAVENOUS
  Administered 2022-03-30 – 2022-03-31 (×2): 50 ug via INTRAVENOUS
  Administered 2022-03-31: 100 ug via INTRAVENOUS
  Filled 2022-03-18 (×14): qty 2

## 2022-03-18 MED ORDER — VITAL AF 1.2 CAL PO LIQD
1000.0000 mL | ORAL | Status: AC
  Administered 2022-03-18: 1000 mL

## 2022-03-18 MED ORDER — ETOMIDATE 2 MG/ML IV SOLN
INTRAVENOUS | Status: DC | PRN
  Administered 2022-03-18: 12 mg via INTRAVENOUS

## 2022-03-18 MED ORDER — FENTANYL CITRATE (PF) 100 MCG/2ML IJ SOLN
25.0000 ug | INTRAMUSCULAR | Status: DC | PRN
  Administered 2022-03-18: 50 ug via INTRAVENOUS

## 2022-03-18 MED ORDER — ETOMIDATE 2 MG/ML IV SOLN
INTRAVENOUS | Status: AC
  Filled 2022-03-18: qty 10

## 2022-03-18 MED ORDER — MIDAZOLAM HCL 2 MG/2ML IJ SOLN
1.0000 mg | INTRAMUSCULAR | Status: DC | PRN
  Administered 2022-03-18: 1 mg via INTRAVENOUS
  Filled 2022-03-18: qty 2

## 2022-03-18 MED ORDER — FENTANYL 2500MCG IN NS 250ML (10MCG/ML) PREMIX INFUSION
0.0000 ug/h | INTRAVENOUS | Status: DC
  Administered 2022-03-18: 25 ug/h via INTRAVENOUS
  Administered 2022-03-20: 100 ug/h via INTRAVENOUS
  Administered 2022-03-21 – 2022-03-22 (×2): 150 ug/h via INTRAVENOUS
  Filled 2022-03-18 (×3): qty 250

## 2022-03-18 MED ORDER — DEXMEDETOMIDINE HCL IN NACL 200 MCG/50ML IV SOLN
0.4000 ug/kg/h | INTRAVENOUS | Status: DC
  Administered 2022-03-18: 0.4 ug/kg/h via INTRAVENOUS
  Administered 2022-03-19: 1 ug/kg/h via INTRAVENOUS
  Filled 2022-03-18 (×2): qty 50

## 2022-03-18 MED ORDER — MIDAZOLAM HCL 5 MG/5ML IJ SOLN
INTRAMUSCULAR | Status: DC | PRN
  Administered 2022-03-18: 1 mg via INTRAVENOUS

## 2022-03-18 NOTE — Progress Notes (Signed)
Checked on patient ? ?Secretions are copious ?He is able to clear it at present but amount of secretions and significant ? ?He is keeping BiPAP in place ? ?Placed an order for 1 mg morphine to be used as needed if significant increase in work of breathing or any anger ? ?Unfortunately he has very poor reserve and may end up needing reintubation ? ?Patient is not very clear regarding being placed back on the ventilator ? ?I did discuss this clearly with him that we do not have any other tools beyond the BiPAP and if he fails BiPAP, it will be advanced or we will not have any other options to keep him alive ?

## 2022-03-18 NOTE — Progress Notes (Addendum)
Pt asking this RN to remove ETT and "just let me go". This Rn appreciates pt is experiencing discomfort from recent intubation. Pt asked if he would be ok with [place] giving him more medication to make him comfortable and pt shrugs his shoulders. When asked if we can call his family to come, pt asks the time and says"no it is too late". ?MD notified of this interaction. ?

## 2022-03-18 NOTE — Progress Notes (Addendum)
OG tube advanced 15 cm at this time as advised by Dr. Oletta Darter on the phone. Awaiting portable abdominal xray for confirmation.  ?

## 2022-03-18 NOTE — Progress Notes (Signed)
Patient extubated at 1315 by RT, per CCM orders to transition to bipap. Patient's O2 sat decreased to 80% on 6L [place] while pt suctioned. WOB increased with accessory muscle use. When pt was unable to sufficiently clear secretions, RT NT suctioned per verbal order from Dr Ander Slade. Pt now coughing up a copious amount of thick, bloody secretions. Pt has been instructed to call RN when he needs to be suctioned. Patient currently resting on bipap at 40% FiO2, O2 sat 100%.  ?

## 2022-03-18 NOTE — Progress Notes (Signed)
Back from XRT ?Placed back on SBT and VTs and RR acceptable for extubation ?We extubated him. Cough very poor. Needed assist w/ oral sxn to mobilize sputum. We placed him on NIPPV ?Plan ?Cont BIPAP on 2 off up to 1  ?PRN sxn ?Will watch closely as he is high risk of intubation  ? ?Erick Colace ACNP-BC ?[place] ?Pager # [phone] OR # [phone] if no answer ? ?

## 2022-03-18 NOTE — Progress Notes (Addendum)
eLink Physician-Brief Progress Note ?Patient Name: Mark Yu ?DOB: 12-31-1955 ?MRN: [id number] ? ? ?Date of Service ? 03/18/2022  ?HPI/Events of Note ? Multiple issues: 1. Remains hypertensive and tachycardic. 2. ABG on 100%/PRVC 14/TV 620/P 5 = 7.35/58/350/31.8.  ?eICU Interventions ? Plan: ?Wean FiO2 as tolerated.  ?Add Fentanyl IV infusion. Titrate to RASS = 0 to -1. ?Fentanyl 25-100 mcg IV Q 2 hours PRN sedation, agitation or pain.   ? ? ? ?Intervention Category ?Major Interventions: Hypertension - evaluation and management;Respiratory failure - evaluation and management;Delirium, psychosis, severe agitation - evaluation and management ? ?[name] ?03/18/2022, 10:29 PM ?

## 2022-03-18 NOTE — Progress Notes (Signed)
eLink Physician-Brief Progress Note ?Patient Name: Mark Yu ?DOB: 08-24-1956 ?MRN: [id number] ? ? ?Date of Service ? 03/18/2022  ?HPI/Events of Note ? Patient now reintubated - Nursing request for sedation, ventilator settings, ABG and nutrition.   ?eICU Interventions ? Plan: ?Ventilator settings: 100%/PRVC 14/TV 620/P 5. Keep sat > 90%. ?ABG at 10 PM. ?Vital AF 1.2 per tube at 55 mL/hour. ?Precedex IV infusion. Titrate to RASS = 0 to -1.  ? ? ? ?Intervention Category ?Major Interventions: Respiratory failure - evaluation and management ? ?Jennea Rager Cornelia Copa ?03/18/2022, 9:32 PM ?

## 2022-03-18 NOTE — Progress Notes (Addendum)
Called made to primary contact [place] and update given that pt is requiring reintubation. Friend verbalizes understanding and sounds tearful over the phone. Attempted to provide emotional support and questions answered.  ?

## 2022-03-18 NOTE — Progress Notes (Signed)
OG tube advanced 5 cm and repeat portable abdominal xray ordered at this time to verify placement. In communication with on call Elink RN regarding pt's condition of HTN and tachycardia despite precedex gtt increased and PRN versed given. Awaiting further orders.  ?

## 2022-03-18 NOTE — Progress Notes (Signed)
? ?NAME:  Mark Yu, MRN:  [id number], DOB:  [birth year], LOS: 9 ?ADMISSION DATE:  03/08/2022, CONSULTATION DATE:  3/14 ?REFERRING MD: Dr. Loleta Books, CHIEF COMPLAINT: SOB  ? ?History of Present Illness:  ?66 yo male smoker presented to [facility] with dyspnea.  He has hx of severe COPD with emphysema and recent diagnosis of extensive stage SCLC on concurrent chemoradiation therapy.  Found to have rhinovirus.  Started on therapy for COPD exacerbation.  Had progressive hypoxia and transferred to SDU 3/13 to start on Bipap.  Respiratory status and mental status got worse, and he required intubation on 3/14.  PCCM assumed care in ICU. ? ?Pertinent  Medical History  ?Severe COPD with emphysema, Depression, Anxiety, HTN, extensive stage SCLC ?Concurrent systemic chemotherapy and radiation treatment ?Rhinovirus infection ? ?Significant Hospital Events: ?Including procedures, antibiotic start and stop dates in addition to other pertinent events   ?3/12 Admit with dyspnea, AECOPD in setting of rhinovirus  ?3/14 PCCM consulted for evaluation of respiratory failure. Intubated for airway protection  ?3/17 On vent, XRT  ?3/18 tolerating pressure support today ?3/19 not weaning as well today with every episode of bronchospasms ?3/20 PRBC for hgb of 6.6, PSV wean & extubated.  Failed due to global weakness/inability to clear secretions > reintubated.  ? ?Interim History / Subjective:  ?No distress. Looks comfortable on PSV  ? ?Objective   ?Blood Pressure 109/64   Pulse (Abnormal) 59   Temperature (Abnormal) 97 ?F (36.1 ?C) (Axillary)   Respiration 16   Height 5\' 11"  (1.803 m)   Weight 48.8 kg   Oxygen Saturation 96%   Body Mass Index 15.00 kg/m?  ? ?Vent Mode: PRVC ?FiO2 (%):  [30 %-40 %] 30 % ?Set Rate:  [14 bmp] 14 bmp ?Vt Set:  [620 mL] 620 mL ?PEEP:  [5 cmH20] 5 cmH20 ?Plateau Pressure:  [14 cmH20-25 cmH20] 15 cmH20  ? ?Intake/Output Summary (Last 24 hours) at 03/18/2022 0758 ?Last data filed at 03/18/2022 1694 ?Gross per 24  hour  ?Intake 731.17 ml  ?Output 1250 ml  ?Net -518.83 ml  ? ?Examination: ?General frail 66 year old male. He is currently on SBT and appears comfortable but very weak ?HENT NCAT does have some temporal wasting. MM are Moist. No neck vein distention. Orally intubated ?Pulm equal clear. No accessory use. VTs 500-600 on PSV ?PCXR w/ RLL infiltrate looking a little worse.  ?Card rrr ?Abd soft ?Ext warm  ?Neuro intact. Very weak  ? ?Resolved Hospital Problem list   ? ? ?Assessment & Plan:  ? 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  ?Passed SBT this am  ?Plan ?Cont PSV for now; will re-assess after XRT. If looks OK will extubate to BIPAP ?Cont Brovana and Pulmicort ?Cont pred 30mg  vt ?Am CXR ?PAD protocol  ? ?Right lower lobe infiltrate ?Looks worse as of 3/22 ?Plan ?F/u micro  ?CBC and PCT today  ? ? ?Limited stage small cell lung cancer ?On radiation therapy. S/p 1 cycle of chemo with cisplatin/etoposide  ?-followed by Dr. Lisbeth Yu & Dr. Earlie Yu  ?Plan ?F/u OPS ? ?Metabolic encephalopathy ?History of depression anxiety ?Plan ?PAD protocol  ? ? ?Protein calorie malnutrition ?Cachexia ?Plan ?Cont tubefeeds ? ? ?Anemia Critical illness ?No evidence of bleeding noted ?Plan ?Trend ?Trigger for transfusion < 7 ? ?HTN  ?Plan ?Norvasc reduced. Currently 2.5mg /d ? ? ?Best Practice (  right click and "Reselect all SmartList Selections" daily)  ?Diet/type: tubefeeds ?DVT prophylaxis: LMWH ?GI prophylaxis: PPI ?Lines: Central line > port/chronic  ?Foley:  N/A ?Code Status:  full code ?Last date of multidisciplinary goals of care discussion: updated pt's girlfriend on 3/14 at bedside.  ? ?Sister, Mark Yu, called for update 3/21. (See progress note)  ?Critical Care Time: 36 minutes   ? Erick Colace ACNP-BC ?[place] ?Pager # [phone] OR # [phone] if no answer ? ? ? ? ?

## 2022-03-18 NOTE — Plan of Care (Signed)
?  Interdisciplinary Goals of Care Family Meeting ? ? ?Date carried out:: 03/18/2022 ? ?Location of the meeting: Bedside ? ?Member's involved: Nurse Practitioner and Bedside Registered Nurse ? ?Durable Power of Tour manager: patient primary, spoke to sister Mark Yu via phone    ? ?Discussion: We discussed goals of care for Mark Yu .   ?I had a lengthy discussion w/ Mark Yu over the phone and also discussed goals w/ Mark Yu at bedside. We spoke specifically about what treatment options we would have should NIPPV fail and he were to require re-intubation.  ?I explained that if this were to happen our recommendation would be tracheostomy placement in hopes that he would qualify for LTAC setting and that over the course of 8-12 weeks could rehab to point he would come off vent and eventually be decannulated. I was clear to both Mark Yu and Mark Yu that we could Not guarantee  he would come off the vent and if that were to happen It would mean he would have to make a decision about vent/SNF vs transition to comfort care. He specifically asked me about voice and talking. I explained to him that even with trach he may still be able to talk w/ PMV but again this would not be guaranteed. When I spoke to Kelford about this she really wanted Mark Yu to make this decision. When I spoke to him he indicated he "would like to try" but I could not get him to elaborate to the point I felt confident he understood one way or another. He indicated that he wanted to talk to his girlfriend  Mark Yu to discuss it any further.  ? ?Code status: Full Code ? ?Disposition: Continue current acute care ? ?Will also ask palliative to be involved at request of his sister  ?Time spent for the meeting: 35 minutes ? ? ?Mark Yu ?03/18/2022, 3:02 PM ? ?

## 2022-03-18 NOTE — Progress Notes (Signed)
eLink Physician-Brief Progress Note ?Patient Name: Mark Yu ?DOB: 05-04-1956 ?MRN: [id number] ? ? ?Date of Service ? 03/18/2022  ?HPI/Events of Note ? Patient extubated to BiPAP this afternoon. Now hypoxic with sat = 86%, hypertensive BP = 196/108 with MAP = 136. He has copious secretions which he needs to NT suctioned frequently to clear, he is using accessory muscles and states that he is getting tired. ABG on 80% BiPAP = 7.33/62/54/33.0. Patient was negative for COVID on 03/08/2022.  ?eICU Interventions ? Plan: ?Reintubate patient.  ?PCCM ground team unable to go to beside. ?Anesthesia called at [facility] to intubate the patient.  ? ? ? ?Intervention Category ?Major Interventions: Respiratory failure - evaluation and management ? ?Nastasia Kage Cornelia Copa ?03/18/2022, 8:37 PM ?

## 2022-03-18 NOTE — Progress Notes (Signed)
eLink Physician-Brief Progress Note ?Patient Name: Mark Yu ?DOB: February 04, 1956 ?MRN: [id number] ? ? ?Date of Service ? 03/18/2022  ?HPI/Events of Note ? Review of CXR and abdominal film for ETT and OGT placement. Orogastric tube tip at the gastroesophageal junction. Recommend advancing tube. Endotracheal tube tip is 7 cm above the carina, unchanged. ETT in satisfactory position.   ?eICU Interventions ? Plan: ?Advance OGT 15 cm and repeat abdominal film.   ? ? ? ?Intervention Category ?Major Interventions: Other: ? ?Kirbi Farrugia Cornelia Copa ?03/18/2022, 10:33 PM ?

## 2022-03-18 NOTE — Transfer of Care (Signed)
Immediate Anesthesia Transfer of Care Note ? ?Patient: Mark Yu ? ?Procedure(s) Performed: AN AD HOC INTUBATION ? ?Patient Location: ICU ? ?Anesthesia Type:General ? ?Level of Consciousness: Patient remains intubated per anesthesia plan ? ?Airway & Oxygen Therapy: Patient placed on Ventilator (see vital sign flow sheet for setting) ? ?Post-op Assessment: Report given to RN and Post -op Vital signs reviewed and stable ? ?Post vital signs: stable  O2 sat 100% bilateral breath sound equal.  On vent per previous settings, HR 144.  BP 147/96 ? ?Last Vitals:  ?Vitals Value Taken Time  ?BP 187/99 03/18/22 2130  ?Temp    ?Pulse    ?Resp 25 03/18/22 2132  ?SpO2    ?Vitals shown include unvalidated device data. ? ?Last Pain:  ?Vitals:  ? 03/18/22 1600  ?TempSrc: Axillary  ?PainSc: 0-No pain  ?   ? ?Patients Stated Pain Goal: 0 (03/18/22 0800) ? ?Complications: No notable events documented. ?

## 2022-03-18 NOTE — Procedures (Signed)
Extubation Procedure Note ? ?Patient Details:   ?Name: Mark Yu ?DOB: 05/07/1956 ?MRN: [id number] ?  ?Airway Documentation:  ?  ?Vent end date: 03/18/22 Vent end time: 1315  ? ?Evaluation ? O2 sats: stable throughout ?Complications: No apparent complications ?Patient did tolerate procedure well. ?Bilateral Breath Sounds: Diminished ?  ?Placed pt on bipap post extubation ? ?Tamera Reason ?03/18/2022, 1:23 PM ? ?

## 2022-03-18 NOTE — Consult Note (Incomplete)
? ?NAME:  Mark Yu, MRN:  [id number], DOB:  04-05-1956, LOS: 9 ?ADMISSION DATE:  03/08/2022, CONSULTATION DATE:  *** ?REFERRING MD:  ***, CHIEF COMPLAINT:  ***  ? ?History of Present Illness:  ?66 y/o M with PMHx smoker, COPD with emphysema, newly diagnosed with SCLC and started chemo/XRT in 2/23. Presented to [facility] ER on 3/12 with reports of shortness of breath x 48hrs. He noted increased dyspnea with exertion.  He was recently prescribed steroids for appetite stimulation.  He noted on admit decreased appetite.  In the ER, he was noted to be tachycardic, have a new O2 need, COVID negative and CXR with no acute abnormality.  CTA Chest was negative for PE but showed stable right nodule and decrease in left nodule & lymphadenopathy, mucus plugging noted.  Found to have rhinovirus.  Started on therapy for COPD exacerbation.  The patient went to radiation therapy on 3/13.  After return from radiation, the patient took his oxygen off and went to the restroom unassisted. He was found in the bed by staff agonal, grey and unresponsive. CODE BLUE was called. The patient did not lose pulses.  He was placed on O2 and slowly became more responsive. ABG showed ph of 7.19, CO2 65 reflective of acute hypercarbic respiratory failure.  CXR did not show new infiltrates.  EKG with ST.  He was transferred to SDU and placed on BiPAP.    ? ?Pertinent  Medical History  ?Severe COPD with emphysema, Depression, Anxiety, HTN, extensive stage SCLC ? ?Significant Hospital Events: ?Including procedures, antibiotic start and stop dates in addition to other pertinent events   ?3/12 Admit with dyspnea, AECOPD in setting of rhinovirus  ?3/13 The patient's mental status returned to baseline and his ABG improved to 7.34, CO2 53.  Overnight, the patient woke and was pulling on BiPAP mask.  He was given ativan over night for agitation.   ?3/14 In the am, he was lethargic on bipap. PCCM consulted for evaluation of respiratory failure. Intubated for  airway protection.  PCCM assumed care in ICU ?3/17-still on vent ?3/18 tolerating pressure support today ?3/19 not weaning as well today with every episode of bronchospasms ?3/20 PRBC for hgb of 6.6, PSV wean & extubated.  Failed due to global weakness/inability to clear secretions > reintubated ?3/22 NAEON. PSV wean & extubated to bipap to cylce 2hrs on and 1hr off. Second attempt extubate to bipap>>Failed second attempt and required reintubation due to poor secretion clearance, use of accessory muscles and fatigue ?3/23 ?Interim History / Subjective:  ?Intubated on sedation. No distress noted. Looks comfortable on PRVC ? ?Objective   ?Blood pressure (!) 99/59, pulse 73, temperature (!) 97.4 ?F (36.3 ?C), temperature source Axillary, resp. rate 15, height 5\' 11"  (1.803 m), weight 49 kg, SpO2 100 %. ?   ?Vent Mode: PRVC ?FiO2 (%):  [30 %-100 %] 40 % ?Set Rate:  [14 bmp-16 bmp] 14 bmp ?Vt Set:  [620 mL] 620 mL ?PEEP:  [5 cmH20] 5 cmH20 ?Pressure Support:  [5 NOB09-62 cmH20] 12 cmH20 ?Plateau Pressure:  [15 cmH20-22 cmH20] 15 cmH20  ? ?Intake/Output Summary (Last 24 hours) at 03/18/2022 0724 ?Last data filed at 03/18/2022 8366 ?Gross per 24 hour  ?Intake 842.93 ml  ?Output 1250 ml  ?Net -407.07 ml  ? ?Filed Weights  ? 03/15/22 0500 03/16/22 0500 03/18/22 0402  ?Weight: 59.1 kg 59 kg 48.8 kg  ? ? ?Examination: ?General: frail 66yoM appears older than stated age. Critically ill on MCV. Appears comfortable but  very weak ?HENT: NCAT does have some temporal wasting. no JVD. MM are Moist. Orally intubated ?Lungs: clear diminished throughout R>L No accessory muscle use. VTs 600-700 on PRVC ?Cardiovascular: RRR. NSR with no ectopy ?Abdomen: soft non distended, non tender ?Extremities: +3 edema BLE cool and dry; pulses palpable 2+ BUE ?Neuro: intact. Very weak ?GU: voids ? ?Resolved Hospital Problem list   ? ? ?Assessment & Plan:  ?Principal Problem: ?  Acute respiratory failure with hypoxia (HCC) ?Active Problems: ?  COPD with  acute exacerbation ([place]) ?  Anxiety ?  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  Hyponatremia ?  Anemia associated with chemotherapy ?  Essential hypertension ?  Metastasis to lymph nodes ([place]) ?  Protein-calorie malnutrition, severe ?  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  ?03/18/22 Second attempt extubate to bipap>>Failed second attempt and required reintubation due to poor secretion clearance, use of accessory muscles and fatigue ?3/23 CXR worsening patchy and medially confluent hazy interstitial consolidation in the RLL consistent with PNA. ?-CXR PRN  ?-wean FIO2 as tolerated keep sats >90 ?-ABG PRN ?-continue brovana + pulmicort + Yupelri ?-Duoneb PRN ?-prednisone 30 mg ?-PAD protocol  ?-plan for trach 03/20/22 ?  ?Right lower lobe infiltrate 2/2 underlying viral pneumonia ?Resp panel + Rhinovirus ?-Sputum culture NGTD - continue to follow ?-no significant leukocytosis or fever ?-Lasix PRN ?  ?History of depression & anxiety ?-limit sedation as tolerated  ?-Wean sedation for RASS Goal 0 to -1 ? ?Limited stage small cell lung cancer  ?Presents with LUL lung nodule as well as left hilar lymphadenopathy as well as RUL nodule diagnosed 01/2022 ?On radiation therapy. S/p 1 cycle of chemo with cisplatin/etoposide  ?-followed by Dr. Earlie Server, oncology ? ?Protein calorie malnutrition ?Cachexia ?-follow electrolytes  ?-vitamin supplements to include folic acid/Vit D53/GDJ D3/ zinc sulfate ?-TF initiated w/ feeding supplement ?  ?Anemia Critical illness ?No evidence of bleeding noted ?-trend CBC, transfuse for Hgb <7% or active bleeding  ?  ?HTN  ?-norvasc to 2.5 mg  ? ?Palliative Care ?-Goals of care discussion ? ?Best Practice (right click and "Reselect all SmartList  Selections" daily)  ? ?Diet/type: NPO ?DVT prophylaxis: LMWH ?GI prophylaxis: PPI ?Lines: Central line> port/chronic ?Foley:  N/A ?Code Status:  full code ?Last date of multidisciplinary goals of care discussion []  ? ?Labs   ?CBC: ?Recent Labs  ?Lab 03/15/22 ?0559 03/16/22 ?0441 03/16/22 ?1601 03/17/22 ?2426 03/18/22 ?0404 03/18/22 ?2035 03/19/22 ?8341  ?WBC 10.3 9.5  --  9.2 9.2 22.8* 16.0*  ?NEUTROABS 8.5* 7.8*  --   --   --   --   --   ?HGB 7.3* 6.6* 9.0* 7.9* 7.5* 10.3* 8.2*  ?HCT 23.9* 21.3* 28.9* 25.1* 23.9* 33.2* 26.4*  ?MCV 99.2 100.0  --  98.4 99.2 98.5 100.0  ?PLT 384 391  --  391 385 761* 469*  ? ? ?Basic Metabolic Panel: ?Recent Labs  ?Lab 03/16/22 ?0441 03/17/22 ?9622 03/18/22 ?0404 03/18/22 ?2029 03/19/22 ?2979  ?NA 140 141 140 142 142  ?K 4.2 4.0 3.6 4.0 3.5  ?CL 105 104 102 104 106  ?CO2 32 33* 32 30 31  ?GLUCOSE 120* 114* 133* 129* 113*  ?BUN 27* 29* 26* 23 25*  ?CREATININE 0.31* <0.30* 0.35* 0.38* 0.44*  ?CALCIUM 8.6* 8.4* 8.5* 8.9 8.8*  ?MG  --  2.1  --  2.2  --   ?PHOS  --  3.7  --   --   --   ? ?GFR: ?Estimated Creatinine Clearance: 63.5 mL/min (A) (by C-G formula based on SCr of 0.35 mg/dL (L)). ?Recent Labs  ?Lab 03/15/22 ?0559 03/16/22 ?4287 03/17/22 ?6811 03/18/22 ?0404  ?WBC 10.3 9.5 9.2 9.2  ? ? ?Liver Function Tests: ?Recent Labs  ?Lab 03/15/22 ?0559  ?AST 13*  ?ALT 19  ?ALKPHOS 54  ?BILITOT 0.2*  ?PROT 5.9*  ?ALBUMIN 2.2*  ? ?No results for input(s): LIPASE, AMYLASE in the last 168 hours. ?No results for input(s): AMMONIA in the last 168 hours. ? ?ABG ?   ?Component Value Date/Time  ? PHART 7.36 03/10/2022 1050  ? PCO2ART 50 (H) 03/10/2022 1050  ? PO2ART 83 03/10/2022 1050  ? HCO3 28.2 (H) 03/10/2022 1050  ? ACIDBASEDEF 4.6 (H) 03/09/2022 1836  ? O2SAT 97 03/10/2022 1050  ?  ? ?Coagulation Profile: ?No results for input(s): INR, PROTIME in the last 168 hours. ? ?Cardiac Enzymes: ?No results for input(s): CKTOTAL, CKMB, CKMBINDEX, TROPONINI in the last 168 hours. ? ?HbA1C: ?No results  found for: HGBA1C ? ?CBG: ?Recent Labs  ?Lab 03/17/22 ?1210 03/17/22 ?1550 03/17/22 ?1955 03/17/22 ?2317 03/18/22 ?0359  ?GLUCAP 134* 141* 132* 113* 137*  ? ? ?Review of Systems:   ?*** ? ?Past Medical History

## 2022-03-19 ENCOUNTER — Inpatient Hospital Stay (HOSPITAL_COMMUNITY): Payer: Medicare Other

## 2022-03-19 ENCOUNTER — Ambulatory Visit: Payer: Medicare Other

## 2022-03-19 DIAGNOSIS — J9601 Acute respiratory failure with hypoxia: Secondary | ICD-10-CM | POA: Diagnosis not present

## 2022-03-19 DIAGNOSIS — D6481 Anemia due to antineoplastic chemotherapy: Secondary | ICD-10-CM | POA: Diagnosis not present

## 2022-03-19 DIAGNOSIS — C3412 Malignant neoplasm of upper lobe, left bronchus or lung: Secondary | ICD-10-CM | POA: Diagnosis not present

## 2022-03-19 DIAGNOSIS — F32A Depression, unspecified: Secondary | ICD-10-CM | POA: Diagnosis not present

## 2022-03-19 DIAGNOSIS — J441 Chronic obstructive pulmonary disease with (acute) exacerbation: Secondary | ICD-10-CM | POA: Diagnosis not present

## 2022-03-19 DIAGNOSIS — Z515 Encounter for palliative care: Secondary | ICD-10-CM

## 2022-03-19 DIAGNOSIS — Z7189 Other specified counseling: Secondary | ICD-10-CM

## 2022-03-19 LAB — CBC
HCT: 26.4 % — ABNORMAL LOW (ref 39.0–52.0)
HCT: 25.3 % — ABNORMAL LOW (ref 39.0–52.0)
Hemoglobin: 8.2 g/dL — ABNORMAL LOW (ref 13.0–17.0)
Hemoglobin: 7.8 g/dL — ABNORMAL LOW (ref 13.0–17.0)
MCH: 31.1 pg (ref 26.0–34.0)
MCH: 30.8 pg (ref 26.0–34.0)
MCHC: 31.1 g/dL (ref 30.0–36.0)
MCHC: 30.8 g/dL (ref 30.0–36.0)
MCV: 100 fL (ref 80.0–100.0)
MCV: 100 fL (ref 80.0–100.0)
Platelets: 469 10*3/uL — ABNORMAL HIGH (ref 150–400)
Platelets: 410 10*3/uL — ABNORMAL HIGH (ref 150–400)
RBC: 2.64 MIL/uL — ABNORMAL LOW (ref 4.22–5.81)
RBC: 2.53 MIL/uL — ABNORMAL LOW (ref 4.22–5.81)
RDW: 19.2 % — ABNORMAL HIGH (ref 11.5–15.5)
RDW: 19.1 % — ABNORMAL HIGH (ref 11.5–15.5)
WBC: 16 10*3/uL — ABNORMAL HIGH (ref 4.0–10.5)
WBC: 11.3 10*3/uL — ABNORMAL HIGH (ref 4.0–10.5)
nRBC: 0 % (ref 0.0–0.2)
nRBC: 0 % (ref 0.0–0.2)

## 2022-03-19 LAB — CULTURE, RESPIRATORY W GRAM STAIN: Culture: NORMAL

## 2022-03-19 LAB — BASIC METABOLIC PANEL
Anion gap: 5 (ref 5–15)
BUN: 25 mg/dL — ABNORMAL HIGH (ref 8–23)
CO2: 31 mmol/L (ref 22–32)
Calcium: 8.8 mg/dL — ABNORMAL LOW (ref 8.9–10.3)
Chloride: 106 mmol/L (ref 98–111)
Creatinine, Ser: 0.44 mg/dL — ABNORMAL LOW (ref 0.61–1.24)
GFR, Estimated: >60 mL/min (ref >60)
Glucose, Bld: 113 mg/dL — ABNORMAL HIGH (ref 70–99)
Potassium: 3.5 mmol/L (ref 3.5–5.1)
Sodium: 142 mmol/L (ref 135–145)

## 2022-03-19 LAB — GLUCOSE, CAPILLARY
Glucose-Capillary: 117 mg/dL — ABNORMAL HIGH (ref 70–99)
Glucose-Capillary: 120 mg/dL — ABNORMAL HIGH (ref 70–99)
Glucose-Capillary: 125 mg/dL — ABNORMAL HIGH (ref 70–99)
Glucose-Capillary: 137 mg/dL — ABNORMAL HIGH (ref 70–99)
Glucose-Capillary: 149 mg/dL — ABNORMAL HIGH (ref 70–99)
Glucose-Capillary: 153 mg/dL — ABNORMAL HIGH (ref 70–99)

## 2022-03-19 LAB — PROCALCITONIN: Procalcitonin: 0.31 ng/mL

## 2022-03-19 LAB — MRSA NEXT GEN BY PCR, NASAL: MRSA by PCR Next Gen: NOT DETECTED

## 2022-03-19 MED ORDER — VANCOMYCIN HCL IN DEXTROSE 1-5 GM/200ML-% IV SOLN: 1000.0000 mg | INTRAVENOUS | Status: DC

## 2022-03-19 MED ORDER — ENOXAPARIN SODIUM 40 MG/0.4ML IJ SOSY
40.0000 mg | PREFILLED_SYRINGE | INTRAMUSCULAR | Status: DC
  Administered 2022-03-21: 40 mg via SUBCUTANEOUS
  Filled 2022-03-19: qty 0.4

## 2022-03-19 MED ORDER — VANCOMYCIN HCL IN DEXTROSE 1-5 GM/200ML-% IV SOLN
1000.0000 mg | Freq: Once | INTRAVENOUS | Status: AC
  Administered 2022-03-19: 1000 mg via INTRAVENOUS
  Filled 2022-03-19: qty 200

## 2022-03-19 MED ORDER — DEXMEDETOMIDINE HCL IN NACL 400 MCG/100ML IV SOLN
0.4000 ug/kg/h | INTRAVENOUS | Status: DC
  Administered 2022-03-19: 0.8 ug/kg/h via INTRAVENOUS
  Administered 2022-03-19: 1 ug/kg/h via INTRAVENOUS
  Administered 2022-03-19: 0.9 ug/kg/h via INTRAVENOUS
  Administered 2022-03-20 – 2022-03-22 (×6): 1.2 ug/kg/h via INTRAVENOUS
  Administered 2022-03-22: 0.7 ug/kg/h via INTRAVENOUS
  Administered 2022-03-22 (×2): 1 ug/kg/h via INTRAVENOUS
  Administered 2022-03-23: 0.7 ug/kg/h via INTRAVENOUS
  Administered 2022-03-23: 1.1 ug/kg/h via INTRAVENOUS
  Administered 2022-03-24: 0.6 ug/kg/h via INTRAVENOUS
  Administered 2022-03-25 (×2): 0.9 ug/kg/h via INTRAVENOUS
  Administered 2022-03-25: 0.8 ug/kg/h via INTRAVENOUS
  Administered 2022-03-26: 0.5 ug/kg/h via INTRAVENOUS
  Filled 2022-03-19 (×19): qty 100

## 2022-03-19 MED ORDER — ROCURONIUM BROMIDE 50 MG/5ML IV SOLN: 100.0000 mg | Freq: Once | INTRAVENOUS | Status: DC

## 2022-03-19 MED ORDER — JEVITY 1.2 CAL PO LIQD
1000.0000 mL | ORAL | Status: DC
Start: 2022-03-19 — End: 2022-03-25
  Administered 2022-03-19 – 2022-03-24 (×6): 1000 mL

## 2022-03-19 MED ORDER — FENTANYL CITRATE (PF) 100 MCG/2ML IJ SOLN: 200.0000 ug | Freq: Once | INTRAMUSCULAR | Status: DC

## 2022-03-19 MED ORDER — MIDAZOLAM HCL 2 MG/2ML IJ SOLN: 5.0000 mg | Freq: Once | INTRAMUSCULAR | Status: DC

## 2022-03-19 MED ORDER — FREE WATER
100.0000 mL | Status: DC
  Administered 2022-03-19 – 2022-03-27 (×44): 100 mL

## 2022-03-19 MED ORDER — ETOMIDATE 2 MG/ML IV SOLN: 20.0000 mg | Freq: Once | INTRAVENOUS | Status: DC

## 2022-03-19 MED ORDER — POTASSIUM CHLORIDE 20 MEQ PO PACK
40.0000 meq | PACK | Freq: Once | ORAL | Status: AC
  Administered 2022-03-19: 40 meq
  Filled 2022-03-19: qty 2

## 2022-03-19 MED ORDER — SODIUM CHLORIDE 0.9 % IV SOLN
2.0000 g | Freq: Three times a day (TID) | INTRAVENOUS | Status: AC
  Administered 2022-03-19 – 2022-03-23 (×15): 2 g via INTRAVENOUS
  Filled 2022-03-19 (×14): qty 2

## 2022-03-19 MED ORDER — FUROSEMIDE 10 MG/ML IJ SOLN
40.0000 mg | Freq: Once | INTRAMUSCULAR | Status: AC
  Administered 2022-03-19: 40 mg via INTRAVENOUS
  Filled 2022-03-19: qty 4

## 2022-03-19 MED ORDER — PROSOURCE TF PO LIQD
45.0000 mL | Freq: Every day | ORAL | Status: DC
  Administered 2022-03-19 – 2022-03-25 (×28): 45 mL
  Filled 2022-03-19 (×26): qty 45

## 2022-03-19 NOTE — Progress Notes (Signed)
ELink notified of pt condition. Pt remains tachycardic, hypertensive and with labored breathing. Pt verbalizes fatigue and is using accessory muscles to breath. Copious secretions noted and is needing suction every 15 minutes or so. Pt desatting when bipap removed to provide pt with oral suctioning. RT aware of pt's guarded condition as is the charge RN. Awaiting MD orders.  ?

## 2022-03-19 NOTE — Progress Notes (Signed)
Nutrition Follow-up ? ?DOCUMENTATION CODES:  ? ?Severe malnutrition in context of chronic illness, Underweight ? ?INTERVENTION:  ?- will adjust TF regimen: Jevity 1.2 @ 45 ml/hr with 45 ml Prosource TF x5/day with 100 ml free water every 4 hours. ?- this regimen will provide 1496 kcal, 115 grams protein, 18 grams fiber, and 1471 ml free water. ?- increased water need d/t increased fiber provision. ? ? ?NUTRITION DIAGNOSIS:  ? ?Severe Malnutrition related to chronic illness, cancer and cancer related treatments as evidenced by severe fat depletion, severe muscle depletion. -ongoing ? ?GOAL:  ? ?Patient will meet greater than or equal to 90% of their needs -met with TF regimen  ? ?MONITOR:  ? ?Vent status, TF tolerance, Labs, Weight trends ? ?REASON FOR ASSESSMENT:  ? ?Ventilator ? ?ASSESSMENT:  ? ?66 y.o. male with medical history of recently diagnosed small cell lung cancer, COPD, anxiety, HTN, and depression. He presented to the ED due to progressive shortness of breath with minimal exertion x2 days. he had been finished a steroid taper shortly before admission and had recently been prescribed an appetite stimulant. ? ?Significant Events: ?3/12- admission ?3/14- intubation; OGT placement; initial RD assessment; tube feeding initiation  ?3/20- extubation + re-intubation; OGT removal + replacement ?3/22- extubation + re-intubation; OGT removal + replacement ? ? ?Patient remains intubated with OGT in place (gastric per abdominal x-ray on 3/22). He is receiving Vital AF 1.2 @ 55 ml/hr with 45 ml Prosource TF once/day and 30 ml free water every 4 hours. This regimen is providing 1624 kcal, 110 grams protein, 7 grams fiber, and 1250 ml free water.  ? ?Patient is able to interact on the vent and was able to use hand gestures and head shakes and nods to communicate with RD. He denies abdominal pain, pressure, nausea, or bubbling feeling. ? ?Weight has fluctuated throughout hospitalization but weight yesterday and today  consistent with admission weight.  ? ?CCM note from today indicates that patient is agreeable to trach.  ? ? ?Patient is currently intubated on ventilator support ?MV: 10.5 L/min ?Temp (24hrs), Avg:97.7 ?F (36.5 ?C), Min:97.4 ?F (36.3 ?C), Max:98.3 ?F (36.8 ?C) ?Propofol: none ?BP: 118/75 and MAP: 91 ? ?Labs reviewed; BUN: 25 mg/d, creatinine: 0.44 mg/dl, Ca: 8.8 mg/dl. ? ?Medications reviewed; 1000 units cholecalciferol/day, 100 mg colace BID, 1 mg folvite/day, 40 mg IV lasix x1 dose 3/23, 400 mg mag-ox/day, 40 mg protonix/day, 17 g miralax/day, 40 mEq Klor-Con x2 doses 3/23, 30 mg deltasone/day, 1000 mcg cyanocobalamin/day, 220 mg zinc sulfate/day.  ? ?Drips; fentanyl @ 50 mcg/hr, precedex @ 0.8 mcg/kg/hr. ? ? ?Diet Order:   ?Diet Order   ? ?       ?  Diet NPO time specified  Diet effective now       ?  ? ?  ?  ? ?  ? ? ?EDUCATION NEEDS:  ? ?No education needs have been identified at this time ? ?Skin:  Skin Assessment: Skin Integrity Issues: ?Skin Integrity Issues:: DTI ?DTI: vertebral column (newly documented on 3/19) ? ?Last BM:  3/22 (type 7 x3, two large amounts and one medium amount) ? ?Height:  ? ?Ht Readings from Last 1 Encounters:  ?03/18/22 _0  (1.803 m)  ? ? ?Weight:  ? ?Wt Readings from Last 1 Encounters:  ?03/19/22 49 kg  ? ? ?BMI:  Body mass index is 15.07 kg/m?. ? ?Estimated Nutritional Needs:  ?Kcal:  1509 kcal ?Protein:  95-120 grams ?Fluid:  >/= 2 L/day ? ? ? ? ?  Jarome Matin, MS, RD, LDN ?Registered Dietitian II ?Inpatient Clinical Nutrition ?RD pager # and on-call/weekend pager # available in [place]  ? ?

## 2022-03-19 NOTE — Progress Notes (Signed)
eLink Physician-Brief Progress Note ?Patient Name: Mark Yu ?DOB: Nov 14, 1956 ?MRN: [id number] ? ? ?Date of Service ? 03/19/2022  ?HPI/Events of Note ? Review of abdominal film post advancing OGT reveals the OGT in good position in the mid to distal stomach.   ?eICU Interventions ? OK to use OGT.   ? ? ? ?Intervention Category ?Major Interventions: Other: ? ?Lasya Vetter Cornelia Copa ?03/19/2022, 12:57 AM ?

## 2022-03-19 NOTE — Progress Notes (Signed)
? ?NAME:  Mark Yu, MRN:  [id number], DOB:  09/02/1956, LOS: 10 ?ADMISSION DATE:  03/08/2022, CONSULTATION DATE:  3/14 ?REFERRING MD: Dr. Loleta Books, CHIEF COMPLAINT: SOB  ? ?History of Present Illness:  ?66 yo male smoker presented to [facility] with dyspnea.  He has hx of severe COPD with emphysema and recent diagnosis of extensive stage SCLC on concurrent chemoradiation therapy.  Found to have rhinovirus.  Started on therapy for COPD exacerbation.  Had progressive hypoxia and transferred to SDU 3/13 to start on Bipap.  Respiratory status and mental status got worse, and he required intubation on 3/14.  PCCM assumed care in ICU. ? ?Pertinent  Medical History  ?Severe COPD with emphysema, Depression, Anxiety, HTN, extensive stage SCLC ?Concurrent systemic chemotherapy and radiation treatment ?Rhinovirus infection ? ?Significant Hospital Events: ?Including procedures, antibiotic start and stop dates in addition to other pertinent events   ?3/12 Admit with dyspnea, AECOPD in setting of rhinovirus  ?3/14 PCCM consulted for evaluation of respiratory failure. Intubated for airway protection  ?3/17 On vent, XRT  ?3/18 tolerating pressure support today ?3/19 not weaning as well today with every episode of bronchospasms ?3/20 PRBC for hgb of 6.6, PSV wean & extubated.  Failed due to global weakness/inability to clear secretions > reintubated.  ?3/22 extubated. D/w family and pt trach vs one-way extubation. Pt was non-committal. Was on NIPPV for about 8 hrs then had to be re-intubated ?3/23 vanc and cefepime started. On-going family and pt discussion. He agreed to trach  ? ?Interim History / Subjective:  ? ?Looks comfortable on vent  ?Objective   ?Blood Pressure 102/62   Pulse 73   Temperature (Abnormal) 97.4 ?F (36.3 ?C) (Axillary)   Respiration 17   Height 5\' 11"  (1.803 m)   Weight 49 kg   Oxygen Saturation 99%   Body Mass Index 15.07 kg/m?  ? ?Vent Mode: PRVC ?FiO2 (%):  [30 %-100 %] 40 % ?Set Rate:  [14 bmp-16 bmp] 14  bmp ?Vt Set:  [620 mL] 620 mL ?PEEP:  [5 cmH20] 5 cmH20 ?Pressure Support:  [5 NOT77-11 cmH20] 12 cmH20 ?Plateau Pressure:  [15 cmH20-22 cmH20] 15 cmH20  ? ?Intake/Output Summary (Last 24 hours) at 03/19/2022 0745 ?Last data filed at 03/19/2022 [phone] ?Gross per 24 hour  ?Intake 1545.28 ml  ?Output 1450 ml  ?Net 95.28 ml  ? ?Examination: ?General frail 66 yo male resting in bed no acute distress.  ?HENT NCAT no JVD temporal wasting.  ?Pulm scattered rhonchi. Still has marked accessory use ?PCXR w/ worsening RLL infiltrate  ?Card rrr ?Abd soft  ?Ext trace LE edema pulses strong  ?Neuro awake and oriented.  ? ?Resolved Hospital Problem list   ? ? ?Assessment & Plan:  ? 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 ?Aspiration PNA ?Looks worse as of 3/23; PCT rising now s/p repeat intubation  ?Plan ?Repeat sputum  ?Start HCAP coverage (vanc/cefepime) ? ?Limited stage small cell lung cancer ?On radiation therapy. S/p 1 cycle of chemo with cisplatin/etoposide  ?-followed by Dr. Lisbeth Renshaw & Dr. Earlie Server  ?Plan ?F/u OP setting ? ?Metabolic encephalopathy (resolved)  ?History of depression anxiety ?Plan ?PAD protocol  ? ?Protein calorie malnutrition ?Cachexia ?Plan ?tubefeeds ? ? ?Anemia Critical illness ?  No evidence of bleeding noted but hgb did drop from 10.3 to 8.2 ? Lab error vs dilution  ?Plan ?Repeat CBC today and in am  ?IV lasix x 1 ?Transfusion trigger <7 ? ?HTN  ?Plan ?Norvasc 2.5 mg w/ hold orders ? ? ?Best Practice (right click and "Reselect all SmartList Selections" daily)  ?Diet/type: tubefeeds ?DVT prophylaxis: LMWH ?GI prophylaxis: PPI ?Lines: Central line > port/chronic  ?Foley:  N/A ?Code  Status:  full code ?Last date of multidisciplinary goals of care discussion: updated pt's girlfriend on 3/14 at bedside.  ? ?Sister, Mark Yu, called for update 3/23 (See progress note)  ?Critical Care Time: 35 minutes   ? Erick Colace ACNP-BC ?[place] ?Pager # [phone] OR # [phone] if no answer ? ? ? ? ?

## 2022-03-19 NOTE — Plan of Care (Addendum)
Long discussion w/ the patient. This was w/ his girl friend at bedside. I also included his sister in this discussion.  ?At this point based on our extensive discussion  ?1) DNR if arrests would be best case scenario. I will cont to discuss this w/ him  ?2) proceed w/ trach w/ [facility] for LTAC/wean ? ?Erick Colace ACNP-BC ?[place] ?Pager # [phone] OR # [phone] if no answer ? ?

## 2022-03-19 NOTE — Progress Notes (Signed)
Pharmacy Antibiotic Note ? ?Mark Yu is a 66 y.o. male admitted on 03/08/2022. Patient intubated 3/14; extubated 3/20 but required re-intubation that same day. Per CCM, current plan is for tracheostomy. Pharmacy has been consulted for vancomycin and cefepime dosing. ? ?Plan: ?Vancomycin 1000 mg IV q24h ?Goal AUC 400-550 ?Expected AUC: 494 ?SCr used: 0.8 ? ?Cefepime 2 g IV q8h ? ? ?Height: 5\' 11"  (180.3 cm) ?Weight: 49 kg (108 lb 0.4 oz) ?IBW/kg (Calculated) : 75.3 ? ?Temp (24hrs), Avg:97.6 ?F (36.4 ?C), Min:97.4 ?F (36.3 ?C), Max:97.9 ?F (36.6 ?C) ? ?Recent Labs  ?Lab 03/16/22 ?0441 03/17/22 ?6226 03/18/22 ?0404 03/18/22 ?2029 03/18/22 ?2035 03/19/22 ?3335 03/19/22 ?1044  ?WBC 9.5 9.2 9.2  --  22.8* 16.0* 11.3*  ?CREATININE 0.31* <0.30* 0.35* 0.38*  --  0.44*  --   ?  ?Estimated Creatinine Clearance: 63.8 mL/min (A) (by C-G formula based on SCr of 0.44 mg/dL (L)).   ? ?Allergies  ?Allergen Reactions  ? Amoxicillin Rash  ? ? ?Antimicrobials this admission: ?Cefepime 3/23 >> ?Vancomycin 3/23 >> ?Azithromycin 3/13 >> 3/17 ? ?Dose adjustments this admission: NA ? ?Microbiology results: ?3/23 Resp cx: pending ?3/20 Resp cx: normal flora ?3/23 MRSA PCR: negative ?3/12 Resp panel: +rhino/enterovirus ? ?Thank you for allowing pharmacy to be a part of this patient?s care. ? ?Tawnya Crook, PharmD, BCPS ?Clinical Pharmacist ?03/19/2022 2:07 PM ? ? ?

## 2022-03-19 NOTE — Progress Notes (Signed)
PT Cancellation Note ? ?Patient Details ?Name: Mark Yu ?MRN: [id number] ?DOB: 1956/09/05 ? ? ?Cancelled Treatment:    Reason Eval/Treat Not Completed: Medical issues which prohibited therapy - pt intubated, requesting rest and check back after trach procedure. PT to check back. ? ?Stacie Glaze, PT DPT ?Acute Rehabilitation Services ?Pager [phone]  ?Office [phone] ? ? ? ?[name] ?03/19/2022, 9:35 AM ?

## 2022-03-19 NOTE — TOC Progression Note (Addendum)
Transition of Care (TOC) - Progression Note  ? ? ?Patient Details  ?Name: Mark Yu ?MRN: [id number] ?Date of Birth: [birth year] ? ?Transition of Care (TOC) CM/SW Contact  ?Tawanna Cooler, RN ?Phone Number: ?03/19/2022, 3:03 PM ? ?Clinical Narrative:    ? ?Spoke with patient's nurse, who states plan is a trach.  Sent message to both Delsa Sale at [place] with [facility] to review patient for appropriateness.   ?CM asked that they not contact the family just yet.  ? ?Addendum: Spoke with provider.  Plan is trach tomorrow.  No plans for a PEG.  Ltachs aware no PEG planned and both [facility] and [facility] are interested and offering a bed.   ? ?CM spoke with patient's sister, Mark Yu, about the two ltachs that are offering a bed.  She is going to look online tonight.  She will be coming into town tomorrow (lives in [place]) and can speak to the ltach reps at that time.  Mark Yu aware that the insurance auth can take up to 10 business days, so we will want to make a choice and start auth as soon as possible.      ? ? ? ?Expected Discharge Plan: Home/Self Care ?Barriers to Discharge: Continued Medical Work up ? ?Expected Discharge Plan and Services ?Expected Discharge Plan: Home/Self Care ?  ?Discharge Planning Services: CM Consult ?  ?Living arrangements for the past 2 months: [place] ?                ?  ?  ?

## 2022-03-20 ENCOUNTER — Inpatient Hospital Stay (HOSPITAL_COMMUNITY): Payer: Medicare Other

## 2022-03-20 ENCOUNTER — Ambulatory Visit: Payer: Medicare Other

## 2022-03-20 DIAGNOSIS — D6481 Anemia due to antineoplastic chemotherapy: Secondary | ICD-10-CM | POA: Diagnosis not present

## 2022-03-20 DIAGNOSIS — J9621 Acute and chronic respiratory failure with hypoxia: Secondary | ICD-10-CM | POA: Diagnosis not present

## 2022-03-20 DIAGNOSIS — F419 Anxiety disorder, unspecified: Secondary | ICD-10-CM | POA: Diagnosis not present

## 2022-03-20 DIAGNOSIS — J441 Chronic obstructive pulmonary disease with (acute) exacerbation: Secondary | ICD-10-CM | POA: Diagnosis not present

## 2022-03-20 DIAGNOSIS — J9601 Acute respiratory failure with hypoxia: Secondary | ICD-10-CM | POA: Diagnosis not present

## 2022-03-20 LAB — GLUCOSE, CAPILLARY
Glucose-Capillary: 103 mg/dL — ABNORMAL HIGH (ref 70–99)
Glucose-Capillary: 126 mg/dL — ABNORMAL HIGH (ref 70–99)
Glucose-Capillary: 127 mg/dL — ABNORMAL HIGH (ref 70–99)
Glucose-Capillary: 82 mg/dL (ref 70–99)
Glucose-Capillary: 90 mg/dL (ref 70–99)
Glucose-Capillary: 99 mg/dL (ref 70–99)

## 2022-03-20 LAB — BASIC METABOLIC PANEL
Anion gap: 3 — ABNORMAL LOW (ref 5–15)
BUN: 26 mg/dL — ABNORMAL HIGH (ref 8–23)
CO2: 31 mmol/L (ref 22–32)
Calcium: 8.5 mg/dL — ABNORMAL LOW (ref 8.9–10.3)
Chloride: 105 mmol/L (ref 98–111)
Creatinine, Ser: 0.32 mg/dL — ABNORMAL LOW (ref 0.61–1.24)
GFR, Estimated: >60 mL/min (ref >60)
Glucose, Bld: 101 mg/dL — ABNORMAL HIGH (ref 70–99)
Potassium: 4.1 mmol/L (ref 3.5–5.1)
Sodium: 139 mmol/L (ref 135–145)

## 2022-03-20 LAB — CBC
HCT: 25.7 % — ABNORMAL LOW (ref 39.0–52.0)
Hemoglobin: 7.7 g/dL — ABNORMAL LOW (ref 13.0–17.0)
MCH: 30.2 pg (ref 26.0–34.0)
MCHC: 30 g/dL (ref 30.0–36.0)
MCV: 100.8 fL — ABNORMAL HIGH (ref 80.0–100.0)
Platelets: 398 10*3/uL (ref 150–400)
RBC: 2.55 MIL/uL — ABNORMAL LOW (ref 4.22–5.81)
RDW: 18.7 % — ABNORMAL HIGH (ref 11.5–15.5)
WBC: 10.1 10*3/uL (ref 4.0–10.5)
nRBC: 0 % (ref 0.0–0.2)

## 2022-03-20 LAB — PROCALCITONIN: Procalcitonin: 0.25 ng/mL

## 2022-03-20 MED ORDER — ETOMIDATE 2 MG/ML IV SOLN: 20.0000 mg | Freq: Once | INTRAVENOUS | Status: AC

## 2022-03-20 MED ORDER — PHENYLEPHRINE 40 MCG/ML (10ML) SYRINGE FOR IV PUSH (FOR BLOOD PRESSURE SUPPORT)
PREFILLED_SYRINGE | INTRAVENOUS | Status: AC
  Administered 2022-03-20: 100 ug via INTRAVENOUS
  Filled 2022-03-20: qty 10

## 2022-03-20 MED ORDER — PHENYLEPHRINE 40 MCG/ML (10ML) SYRINGE FOR IV PUSH (FOR BLOOD PRESSURE SUPPORT): 100.0000 ug | PREFILLED_SYRINGE | Freq: Once | INTRAVENOUS | Status: AC | PRN

## 2022-03-20 MED ORDER — ROCURONIUM BROMIDE 50 MG/5ML IV SOLN
60.0000 mg | Freq: Once | INTRAVENOUS | Status: AC
  Filled 2022-03-20: qty 6

## 2022-03-20 MED ORDER — MIDAZOLAM HCL 2 MG/2ML IJ SOLN
INTRAMUSCULAR | Status: AC
  Administered 2022-03-20: 2 mg via INTRAVENOUS
  Filled 2022-03-20: qty 6

## 2022-03-20 MED ORDER — FENTANYL CITRATE (PF) 100 MCG/2ML IJ SOLN: 100.0000 ug | Freq: Once | INTRAMUSCULAR | Status: AC

## 2022-03-20 MED ORDER — FENTANYL CITRATE (PF) 100 MCG/2ML IJ SOLN
100.0000 ug | Freq: Once | INTRAMUSCULAR | Status: AC
  Administered 2022-03-20: 100 ug via INTRAVENOUS
  Filled 2022-03-20: qty 2

## 2022-03-20 MED ORDER — ROCURONIUM BROMIDE 10 MG/ML (PF) SYRINGE
PREFILLED_SYRINGE | INTRAVENOUS | Status: AC
  Administered 2022-03-20: 60 mg via INTRAVENOUS
  Filled 2022-03-20: qty 10

## 2022-03-20 MED ORDER — ETOMIDATE 2 MG/ML IV SOLN
INTRAVENOUS | Status: AC
  Administered 2022-03-20: 20 mg via INTRAVENOUS
  Filled 2022-03-20: qty 10

## 2022-03-20 MED ORDER — FENTANYL CITRATE (PF) 100 MCG/2ML IJ SOLN
INTRAMUSCULAR | Status: AC
  Administered 2022-03-20: 100 ug via INTRAVENOUS
  Filled 2022-03-20: qty 4

## 2022-03-20 MED ORDER — PHENYLEPHRINE 40 MCG/ML (10ML) SYRINGE FOR IV PUSH (FOR BLOOD PRESSURE SUPPORT)
100.0000 ug | PREFILLED_SYRINGE | Freq: Once | INTRAVENOUS | Status: AC | PRN
  Administered 2022-03-20: 100 ug via INTRAVENOUS

## 2022-03-20 MED ORDER — MIDAZOLAM HCL 2 MG/2ML IJ SOLN: 2.0000 mg | Freq: Once | INTRAMUSCULAR | Status: AC

## 2022-03-20 NOTE — Procedures (Signed)
Tracheostomy Change Note ? ?Patient Details:   ?Name: Mark Yu ?DOB: 1956/08/15 ?MRN: [id number] ?   ?Airway Documentation: ?   ? ?Evaluation ? O2 sats: stable throughout ?Complications: No apparent complications ?Patient did tolerate procedure well. ?Bilateral Breath Sounds: Clear, Diminished ? ?Trach placed by [facility], NP with CCM Tamala Julian, MD. No complications noted during procedure. All bedside supplies are available. Vitals are stable at this time, RT will continue to monitor.  ?  ? ?Brigette Hopfer A Samra Pesch ?03/20/2022, 12:54 PM ?

## 2022-03-20 NOTE — Consult Note (Signed)
? ?                                                                                ?Consultation Note ?Date: 03/20/2022  ? ?Patient Name: Mark Yu  ?DOB: 01/06/1956  MRN: [id number]  Age / Sex: 66 y.o., male  ?PCP: [name], MD ?Referring Physician: Brand Males, MD ? ?Reason for Consultation: Establishing goals of care ? ?HPI/Patient Profile: 66 y.o. male  with past medical history of COPD, emphysema, extensive stage small cell lung cancer on concurrent chemoradiation admitted with rhinovirus and COPD exacerbation.  His clinical course has been complicated by repeat intubations and conversation was had regarding one-way extubation versus consideration for tracheostomy.  Noted to desire to proceed with tracheostomy today.  Palliative consulted for support/continue goals moving forward. ? ?Clinical Assessment and Goals of Care: ?Palliative care consult received.  Chart reviewed including personal review of pertinent imaging.  Discussed with Marni Griffon from PCCM. ? ?I met today with [name] in conjunction with RN, Damian Leavell, from palliative care team.  Mr. Moga was awake and alert on ventilator.  He communicates by pointing to printed letters on sheet of paper to spell out words.  He is awake and alert and follows conversation easily. ? ?I introduced palliative care as specialized medical care for people living with serious illness. It focuses on providing relief from the symptoms and stress of a serious illness. The goal is to improve quality of life for both the patient and the family. ? ?We discussed his clinical course to this point in time as well as wishes moving forward for his care.  He is in agreement with plan for tracheostomy and seems to have good understanding of procedure, risks, benefits, and likely pathway forward. ? ?Discussed plan to follow-up after tracheostomy to see how he is doing, provide any support that we can, and continue conversation based upon his clinical  course. ? ? ?SUMMARY OF RECOMMENDATIONS   ?-Full code/full scope ?-Discussed today with [name].  He has had multiple conversations with critical care team and understands his situation and wants to proceed with tracheostomy. ?-We discussed palliative care continue to follow for support following tracheostomy and he is agreeable. ? ?Code Status/Advance Care Planning: ?Full code ?Prognosis:  ?Unable to determine ? ?Discharge Planning: Likely LTAC ? ?  ? ?Primary Diagnoses: ?Present on Admission: ? Acute respiratory failure with hypoxia ([place]) ? Anxiety ? 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 ? ?I have reviewed the medical record, interviewed the patient and family, and examined the patient. The following aspects are pertinent. ? ?Past Medical History:  ?Diagnosis Date  ? Anxiety   ? tx xanax  ? COPD (chronic obstructive pulmonary disease) ([place])   ? tx inhalers  ? Depression   ? Dyspnea   ? with exertion  ? HTN (hypertension)   ? tx amlodipine  ? Hyponatremia 03/08/2022  ? ?Social History  ? ?Socioeconomic History  ? Marital status: Significant Other  ?  Spouse name: Not on file  ? Number of children: Not on file  ? Years of education: Not on file  ? Highest education level: Not on  file  ?Occupational History  ? Not on file  ?Tobacco Use  ? Smoking status: Every Day  ?  Packs/day: 0.25  ?  Years: 50.00  ?  Pack years: 12.50  ?  Types: Cigarettes  ? Smokeless tobacco: Not on file  ? Tobacco comments:  ?  3 cigarettes from Sunday to Wednesday.  1 whole cigarettes today 02/20/2022 hfb  ?Vaping Use  ? Vaping Use: Never used  ?Substance and Sexual Activity  ? Alcohol use: Yes  ?  Alcohol/week: 30.0 standard drinks  ?  Types: 30 Cans of beer per week  ? Drug use: Yes  ?  Types: Marijuana  ?  Comment: Last use in 03/2021  ? Sexual activity: Yes  ?Other Topics Concern  ? Not on file  ?Social History Narrative  ? Not on file  ? ?Social Determinants of Health  ? ?Financial Resource Strain: Not on file  ?Food  Insecurity: Not on file  ?Transportation Needs: Not on file  ?Physical Activity: Not on file  ?Stress: Not on file  ?Social Connections: Not on file  ? ?Family History  ?Problem Relation Age of Onset  ? Alzheimer's disease Mother   ? Multiple sclerosis Father   ? ?Scheduled Meds: ? amLODipine  2.5 mg Per Tube Daily  ? arformoterol  15 mcg Nebulization BID  ? budesonide (PULMICORT) nebulizer solution  0.5 mg Nebulization BID  ? chlorhexidine gluconate (MEDLINE KIT)  15 mL Mouth Rinse BID  ? Chlorhexidine Gluconate Cloth  6 each Topical Daily  ? cholecalciferol  1,000 Units Per Tube Daily  ? docusate  100 mg Per Tube BID  ? enoxaparin (LOVENOX) injection  40 mg Subcutaneous Q24H  ? feeding supplement (PROSource TF)  45 mL Per Tube 5 X Daily  ? folic acid  1 mg Per Tube Daily  ? free water  100 mL Per Tube Q4H  ? magnesium oxide  400 mg Per Tube Daily  ? mouth rinse  15 mL Mouth Rinse 10 times per day  ? nystatin   Topical BID  ? pantoprazole sodium  40 mg Per Tube Daily  ? polyethylene glycol  17 g Per Tube Daily  ? predniSONE  30 mg Per Tube Q breakfast  ? revefenacin  175 mcg Nebulization Daily  ? sodium chloride flush  10-40 mL Intracatheter Q12H  ? sodium chloride flush  3 mL Intravenous Q12H  ? vitamin B-12  1,000 mcg Per Tube Daily  ? zinc sulfate  220 mg Per Tube Daily  ? ?Continuous Infusions: ? sodium chloride Stopped (03/19/22 1516)  ? ceFEPime (MAXIPIME) IV Stopped (03/20/22 0520)  ? dexmedetomidine (PRECEDEX) IV infusion 1.1 mcg/kg/hr (03/20/22 0800)  ? feeding supplement (JEVITY 1.2 CAL) Stopped (03/20/22 0015)  ? fentaNYL infusion INTRAVENOUS 100 mcg/hr (03/20/22 0442)  ? vancomycin    ? ?PRN Meds:.sodium chloride, acetaminophen **OR** acetaminophen, albuterol, fentaNYL (SUBLIMAZE) injection, midazolam, sodium chloride flush ?Medications Prior to Admission:  ?Prior to Admission medications   ?Medication Sig Start Date End Date Taking? Authorizing Provider  ?acetaminophen (TYLENOL) 500 MG tablet Take 500  mg by mouth every 8 (eight) hours as needed for moderate pain or headache.   Yes [provider]  ?albuterol (PROVENTIL) (2.5 MG/3ML) 0.083% nebulizer solution Take 3 mLs (2.5 mg total) by nebulization every 6 (six) hours as needed for wheezing or shortness of breath. 02/20/22  Yes Martyn Ehrich, NP  ?albuterol (VENTOLIN HFA) 108 (90 Base) MCG/ACT inhaler Inhale 2 puffs into the lungs every  4 (four) hours as needed for wheezing or shortness of breath.   Yes [provider]  ?ALPRAZolam Duanne Moron) 1 MG tablet Take 0.5-1 tablets (0.5-1 mg total) by mouth 2 (two) times daily as needed for anxiety. 02/20/22  Yes Martyn Ehrich, NP  ?amLODipine (NORVASC) 5 MG tablet Take 5 mg by mouth daily. 12/28/21  Yes [provider]  ?BREZTRI AEROSPHERE 160-9-4.8 MCG/ACT AERO Inhale 2 puffs into the lungs in the morning and at bedtime. 01/02/22  Yes [provider]  ?Calcium Carb-Cholecalciferol 500-10 MG-MCG TABS Take 1 tablet by mouth daily.   Yes [provider]  ?cholecalciferol (VITAMIN D) 25 MCG (1000 UNIT) tablet Take 1,000 Units by mouth daily.   Yes [provider]  ?fluticasone (FLONASE) 50 MCG/ACT nasal spray Place 2 sprays into both nostrils daily as needed for allergies or rhinitis.   Yes [provider]  ?folic acid (FOLVITE) 779 MCG tablet Take 400 mcg by mouth daily.   Yes [provider]  ?guaiFENesin (MUCINEX) 600 MG 12 hr tablet Take 600 mg by mouth every evening.   Yes [provider]  ?magnesium oxide (MAG-OX) 400 MG tablet Take 400 mg by mouth daily.   Yes [provider]  ?prochlorperazine (COMPAZINE) 10 MG tablet Take 1 tablet (10 mg total) by mouth every 6 (six) hours as needed for nausea or vomiting. 02/09/22  Yes Curt Bears, MD  ?sucralfate (CARAFATE) 1 g tablet Take 1 tablet (1 g total) by mouth 4 (four) times daily. Dissolve each tablet in 15 cc water before use. 03/06/22  Yes Kyung Rudd, MD  ?vitamin B-12  (CYANOCOBALAMIN) 1000 MCG tablet Take 1,000 mcg by mouth daily.   Yes [provider]  ?lidocaine-prilocaine (EMLA) cream Apply to the Port-A-Cath site 30-60 minutes before chemotherapy 02/09/22

## 2022-03-20 NOTE — Evaluation (Signed)
SLP Cancellation Note ? ?Patient Details ?Name: Mark Yu ?MRN: [id number] ?DOB: [birth year] ? ? ?Cancelled treatment:       Reason Eval/Treat Not Completed: Other (comment);Medical issues which prohibited therapy (orders for trach team received including PMSV, Swallow- pt received trach today and is on full vent support. Will follow up Monday 3/27 to determine readiness for SLP intervention. Spoke to RN who agrees. Thanks.) ? ?Kathleen Lime, MS CCC SLP ?Acute Rehab Services ?Office [phone] ?Pager [phone] ? ?[name] ?03/20/2022, 4:25 PM ? ? ? ?

## 2022-03-20 NOTE — Progress Notes (Addendum)
? ?NAME:  Mark Yu, MRN:  [id number], DOB:  05-20-1956, LOS: 7 ?ADMISSION DATE:  03/08/2022, CONSULTATION DATE:  3/14 ?REFERRING MD: Dr. Loleta Books, CHIEF COMPLAINT: SOB  ? ?History of Present Illness:  ?66 yo male smoker presented to [facility] with dyspnea.  He has hx of severe COPD with emphysema and recent diagnosis of extensive stage SCLC on concurrent chemoradiation therapy.  Found to have rhinovirus.  Started on therapy for COPD exacerbation.  Had progressive hypoxia and transferred to SDU 3/13 to start on Bipap.  Respiratory status and mental status got worse, and he required intubation on 3/14.  PCCM assumed care in ICU. ? ?Pertinent  Medical History  ?Severe COPD with emphysema, Depression, Anxiety, HTN, extensive stage SCLC ?Concurrent systemic chemotherapy and radiation treatment ?Rhinovirus infection ? ?Significant Hospital Events: ?Including procedures, antibiotic start and stop dates in addition to other pertinent events   ?3/12 Admit with dyspnea, AECOPD in setting of rhinovirus  ?3/14 PCCM consulted for evaluation of respiratory failure. Intubated for airway protection  ?3/17 On vent, XRT  ?3/18 tolerating pressure support today ?3/19 not weaning as well today with every episode of bronchospasms ?3/20 PRBC for hgb of 6.6, PSV wean & extubated.  Failed due to global weakness/inability to clear secretions > reintubated.  ?3/22 extubated. D/w family and pt trach vs one-way extubation. Pt was non-committal. Was on NIPPV for about 8 hrs then had to be re-intubated ?3/23 vanc and cefepime started. On-going family and pt discussion. He agreed to trach  ? ?Interim History / Subjective:  ?He looks comfortable this morning.  Communicating with staff with a computer and keyboard ?Objective   ?Blood Pressure (Abnormal) 94/54   Pulse 64   Temperature (Abnormal) 97.5 ?F (36.4 ?C) (Axillary)   Respiration 16   Height 5\' 11"  (1.803 m)   Weight 48.8 kg   Oxygen Saturation 100%   Body Mass Index 15.00 kg/m?   ? ?Vent Mode: PRVC ?FiO2 (%):  [40 %] 40 % ?Set Rate:  [14 bmp] 14 bmp ?Vt Set:  [620 mL] 620 mL ?PEEP:  [5 cmH20] 5 cmH20 ?Pressure Support:  [10 cmH20] 10 cmH20 ?Plateau Pressure:  [15 cmH20] 15 cmH20  ? ?Intake/Output Summary (Last 24 hours) at 03/20/2022 0836 ?Last data filed at 03/20/2022 7829 ?Gross per 24 hour  ?Intake 2690.07 ml  ?Output 3150 ml  ?Net -459.93 ml  ? ?Examination: ? ?General: 66 year old male patient remains on full ventilator support following failed extubation x2 ?HEENT: Normocephalic atraumatic does exhibit temporal wasting orally intubated ?Pulmonary diminished bases no accessory use ?Cardiac regular rate and rhythm ?Abdomen soft not tender ?Extremities are warm and dry ?Neuro awake and oriented ? ?Resolved Hospital Problem list   ?Acute metabolic encephalopathy ? ?Assessment & Plan:  ?Principal Problem: ?  Acute respiratory failure with hypoxia (H[facility]) ?Active Problems: ?  COPD exacerbation ([place]) ?  Anxiety ?  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  Anemia associated with chemotherapy ?  Essential hypertension ?  Metastasis to lymph nodes ([place]) ?  Protein-calorie malnutrition, severe ?  Depression ?  Physical deconditioning ? ? ?Acute on chronic hypoxic/hypercapnic respiratory failure from rhinovirus pneumonia w/ COPD exacerbation ?Tobacco Abuse with Severe COPD with extensive centrilobular and bullous emphysema ?Failed extubation 3/20 and again on 3/22 poor secretion management and endurance  ?Plan ?Continue full vent support ?Tracheostomy later today ?Would assess for daily aerosol trach collar trial as early as 3/25 ?VAP bundle ?PAD protocol RASS goal 0 ?Antibiotics per below ? ?  Aspiration PNA ?Looks worse as of 3/23; PCT rising now s/p repeat intubation  ?Plan ?Day #2 vancomycin and cefepime, dc vanc  ?Follow-up pending cultures ? ?Limited stage small cell lung cancer ?On radiation therapy. S/p 1 cycle of chemo with cisplatin/etoposide  ?-followed by Dr. Lisbeth Renshaw & Dr.  Earlie Server  ?Plan ?Follow-up after resolution of acute illness ? ?History of depression anxiety ?Plan ?PAD protocol ? ?Protein calorie malnutrition ?Cachexia ?Plan ?Holding tube feeds pending trach, then resume ? ?Anemia Critical illness ?Hemoglobin stable, likely a dilutional effect ?Plan ?Trend CBC ?Transfusion trigger for less than 7 ? ?HTN  ?Plan ?Continue Norvasc 2.5 mg with hold orders ? ? ?Best Practice (right click and "Reselect all SmartList Selections" daily)  ?Diet/type: tubefeeds ?DVT prophylaxis: LMWH ?GI prophylaxis: PPI ?Lines: Central line > port/chronic  ?Foley:  N/A ?Code Status:  full code ?Last date of multidisciplinary goals of care discussion: updated pt's girlfriend on 3/24 ?Critical Care Time: 34 min ?  ? Erick Colace ACNP-BC ?[place] ?Pager # [phone] OR # [phone] if no answer ? ? ? ? ?

## 2022-03-20 NOTE — Procedures (Signed)
Diagnostic Bronchoscopy ? ?Mark Yu  ?[id number]  ?11-Jul-1956 ? ?Date:03/20/22  ?Time:1:24 PM  ? ?Provider Performing:Caedin Mogan C Tamala Julian  ? ?Procedure: Diagnostic Bronchoscopy (59935) ? ?Indication(s) ?Assist with direct visualization of tracheostomy placement ? ?Consent ?Risks of the procedure as well as the alternatives and risks of each were explained to the patient and/or caregiver.  Consent for the procedure was obtained. ? ? ?Anesthesia ?See separate tracheostomy note ? ? ?Time Out ?Verified patient identification, verified procedure, site/side was marked, verified correct patient position, special equipment/implants available, medications/allergies/relevant history reviewed, required imaging and test results available. ? ? ?Sterile Technique ?Usual hand hygiene, masks, gowns, and gloves were used ? ? ?Procedure Description ?Bronchoscope advanced through endotracheal tube and into airway.  After suctioning out tracheal secretions, bronchoscope used to provide direct visualization of tracheostomy placement. ? ? ?Complications/Tolerance ?None; patient tolerated the procedure well. ? ? ?EBL ?None ? ?Specimen(s) ?None ? ?

## 2022-03-20 NOTE — Progress Notes (Signed)
Percutaneous Tracheostomy Procedure Note ? ? ?Mark Yu  ?[id number]  ?[birth year] ? ?Date:03/20/22  ?Time:12:55 PM  ? ?Provider Performing:Pete E [name] ? ?Procedure: Percutaneous Tracheostomy with Bronchoscopic Guidance (11021) ? ?Indication(s) ?Vent weaning ? ?Consent ?Risks of the procedure as well as the alternatives and risks of each were explained to the patient and/or caregiver.  Consent for the procedure was obtained. ? ?Anesthesia ?Etomidate, Versed, Fentanyl, Vecuronium ? ? ?Time Out ?Verified patient identification, verified procedure, site/side was marked, verified correct patient position, special equipment/implants available, medications/allergies/relevant history reviewed, required imaging and test results available. ? ? ?Sterile Technique ?Maximal sterile technique including sterile barrier drape, hand hygiene, sterile gown, sterile gloves, mask, hair covering. ? ? ? ?Procedure Description ?Appropriate anatomy identified by palpation.  Patient's neck prepped and draped in sterile fashion.  1% lidocaine with epinephrine was used to anesthetize skin overlying neck.  1.5cm incision made and blunt dissection performed until tracheal rings could be easily palpated.   Then a size 6 Shiley tracheostomy was placed under bronchoscopic visualization using usual Seldinger technique and serial dilation.   Bronchoscope confirmed placement above the carina.  Tracheostomy was sutured in place with adhesive pad to protect skin under pressure.    Patient connected to ventilator. ? ? ?Complications/Tolerance ?None; patient tolerated the procedure well. ?Chest X-ray is ordered to confirm no post-procedural complication. ? ? ?EBL ?Minimal ? ? ?Specimen(s) ?None   ?

## 2022-03-21 ENCOUNTER — Inpatient Hospital Stay (HOSPITAL_COMMUNITY): Payer: Medicare Other

## 2022-03-21 DIAGNOSIS — J9601 Acute respiratory failure with hypoxia: Secondary | ICD-10-CM | POA: Diagnosis not present

## 2022-03-21 LAB — COMPREHENSIVE METABOLIC PANEL
ALT: 17 U/L (ref 0–44)
AST: 16 U/L (ref 15–41)
Albumin: 2.3 g/dL — ABNORMAL LOW (ref 3.5–5.0)
Alkaline Phosphatase: 59 U/L (ref 38–126)
Anion gap: 3 — ABNORMAL LOW (ref 5–15)
BUN: 25 mg/dL — ABNORMAL HIGH (ref 8–23)
CO2: 32 mmol/L (ref 22–32)
Calcium: 8.1 mg/dL — ABNORMAL LOW (ref 8.9–10.3)
Chloride: 102 mmol/L (ref 98–111)
Creatinine, Ser: 0.36 mg/dL — ABNORMAL LOW (ref 0.61–1.24)
GFR, Estimated: >60 mL/min (ref >60)
Glucose, Bld: 116 mg/dL — ABNORMAL HIGH (ref 70–99)
Potassium: 3.9 mmol/L (ref 3.5–5.1)
Sodium: 137 mmol/L (ref 135–145)
Total Bilirubin: 1.2 mg/dL (ref 0.3–1.2)
Total Protein: 6.1 g/dL — ABNORMAL LOW (ref 6.5–8.1)

## 2022-03-21 LAB — CBC
HCT: 19.9 % — ABNORMAL LOW (ref 39.0–52.0)
HCT: 27.6 % — ABNORMAL LOW (ref 39.0–52.0)
Hemoglobin: 6 g/dL — CL (ref 13.0–17.0)
Hemoglobin: 8.8 g/dL — ABNORMAL LOW (ref 13.0–17.0)
MCH: 30.3 pg (ref 26.0–34.0)
MCH: 31.1 pg (ref 26.0–34.0)
MCHC: 30.2 g/dL (ref 30.0–36.0)
MCHC: 31.9 g/dL (ref 30.0–36.0)
MCV: 100.5 fL — ABNORMAL HIGH (ref 80.0–100.0)
MCV: 97.5 fL (ref 80.0–100.0)
Platelets: 312 10*3/uL (ref 150–400)
Platelets: 355 10*3/uL (ref 150–400)
RBC: 1.98 MIL/uL — ABNORMAL LOW (ref 4.22–5.81)
RBC: 2.83 MIL/uL — ABNORMAL LOW (ref 4.22–5.81)
RDW: 17.4 % — ABNORMAL HIGH (ref 11.5–15.5)
RDW: 18.6 % — ABNORMAL HIGH (ref 11.5–15.5)
WBC: 11.4 10*3/uL — ABNORMAL HIGH (ref 4.0–10.5)
WBC: 11.6 10*3/uL — ABNORMAL HIGH (ref 4.0–10.5)
nRBC: 0 % (ref 0.0–0.2)
nRBC: 0 % (ref 0.0–0.2)

## 2022-03-21 LAB — PREPARE RBC (CROSSMATCH)

## 2022-03-21 LAB — PROTIME-INR
INR: 1.2 (ref 0.8–1.2)
Prothrombin Time: 14.9 seconds (ref 11.4–15.2)

## 2022-03-21 LAB — BLOOD GAS, ARTERIAL
Acid-Base Excess: 4.7 mmol/L — ABNORMAL HIGH (ref 0.0–2.0)
Bicarbonate: 32 mmol/L — ABNORMAL HIGH (ref 20.0–28.0)
O2 Saturation: 87.8 %
Patient temperature: 36.7
pCO2 arterial: 57 mmHg — ABNORMAL HIGH (ref 32–48)
pH, Arterial: 7.35 (ref 7.35–7.45)
pO2, Arterial: 56 mmHg — ABNORMAL LOW (ref 83–108)

## 2022-03-21 LAB — GLUCOSE, CAPILLARY
Glucose-Capillary: 108 mg/dL — ABNORMAL HIGH (ref 70–99)
Glucose-Capillary: 113 mg/dL — ABNORMAL HIGH (ref 70–99)
Glucose-Capillary: 117 mg/dL — ABNORMAL HIGH (ref 70–99)
Glucose-Capillary: 120 mg/dL — ABNORMAL HIGH (ref 70–99)
Glucose-Capillary: 153 mg/dL — ABNORMAL HIGH (ref 70–99)
Glucose-Capillary: 97 mg/dL (ref 70–99)

## 2022-03-21 LAB — CULTURE, RESPIRATORY W GRAM STAIN
Culture: NORMAL
Gram Stain: NONE SEEN

## 2022-03-21 MED ORDER — SODIUM CHLORIDE 0.9% IV SOLUTION: Freq: Once | INTRAVENOUS | Status: DC

## 2022-03-21 NOTE — Progress Notes (Signed)
Pt belongings retrieved from security and returned to patient per his request. Pt then sent belonging home with sister, [name]. Belonging form signed and placed in chart.  ?

## 2022-03-21 NOTE — Progress Notes (Signed)
? ?  Lot of ATC today and movement around trach ?Had tracheal bleed and ooze but now stopped ? ?O ?REsting well on PRVC ?No active bleeding on exam ? ?Plan ?Rest on Vent tonight ?If rebleeds can do epi injections or thrombin pad ?Dc lovenox ? ? ? ?SIGNATURE  ? ? ?Dr. Brand Males, M.D., F.C.C.P,  ?Pulmonary and Critical Care Medicine ?Staff Physician, [place] ?Center Director - Interstitial Lung Disease  Program  ?Pulmonary [place] [facility] ?[place], [place], [address] ? ?NPI Number:  NPI #[id number] ?DEA Number: [id number] ? ?Pager: [phone], If no answer  -> Check AMION or Try [phone] ?Telephone (clinical office): [phone] ?Telephone (research): [phone] ? ?6:28 PM ?03/21/2022 ? ?

## 2022-03-21 NOTE — Progress Notes (Signed)
Removed old mepilex, no skin breakdown noted. Cleaned the old dry blood around the trach. Placed new mepilex and new gauze. Pt did well and resting good.  ?

## 2022-03-21 NOTE — Progress Notes (Signed)
PT Cancellation Note ? ?Patient Details ?Name: Mark Yu ?MRN: [id number] ?DOB: [birth year] ? ? ?Cancelled Treatment:     PT deferred this am, pt continues on vent post trach.  Will follow. ? ? ?[name] ?03/21/2022, 7:37 AM ?

## 2022-03-21 NOTE — TOC Progression Note (Signed)
Transition of Care (TOC) - Progression Note  ? ? ?Patient Details  ?Name: Mark Yu ?MRN: [id number] ?Date of Birth: 04-Aug-1956 ? ?Transition of Care (TOC) CM/SW Contact  ?Tawanna Cooler, RN ?Phone Number: ?03/21/2022, 3:19 PM ? ?Clinical Narrative:    ? ?Spoke with patient's sister [name].  She toured both Ltach facilities, and chose [facility].  CM notified Delsa Sale with [facility], and she will start insurance auth on Monday.    ? ?

## 2022-03-21 NOTE — Progress Notes (Signed)
HGB 6.0 Critical called in by lab. CCM informed. ?

## 2022-03-21 NOTE — Progress Notes (Addendum)
eLink Physician-Brief Progress Note ?Patient Name: Dekendrick Uzelac ?DOB: 11/01/1956 ?MRN: [id number] ? ? ?Date of Service ? 03/21/2022  ?HPI/Events of Note ? Camera evaluation.  ? ?RN discussion. Was pinkish trach secretions, now blood noted for last 30 mins. RR 16, no abdominal breathing. Thin built. New trach. Sats good. ?Vt 620/5. ?On precedex gtt. ?Awake. Alert looking.  ?eICU Interventions ? - get stat CxR/ABG/Hg ( was 7.7). avoid frequent suctioning. On Lovenox sq as VTE prophylaxis. ?- on nebs. Crackles in lungs. ? ?Previous x ray reviewed.   ? ? ? ?Intervention Category ?Intermediate Interventions: Bleeding - evaluation and treatment with blood products;Respiratory distress - evaluation and management ? ?Elmer Sow ?03/21/2022, 12:22 AM ? ?01:05 ?Hg at 6. ?Trach bleeding. ?Plt 312 K ? ?CxR film seen and compared. COPD/emphysema chest. No ground glass or effusion .  ?Stable.Trach in place.  ?ABG pending. ? ?- transfuse 1 PRBC stat. Follow Hg closely. ? ? ?

## 2022-03-21 NOTE — Progress Notes (Addendum)
? ?NAME:  Mark Yu, MRN:  [id number], DOB:  February 12, 1956, LOS: 12 ?ADMISSION DATE:  03/08/2022, CONSULTATION DATE:  3/14 ?REFERRING MD: Dr. Loleta Books, CHIEF COMPLAINT: SOB  ? ?HBRIEF  ?66 yo male smoker presented to [facility] with dyspnea.  He has hx of severe COPD with emphysema and recent diagnosis of extensive stage SCLC on concurrent chemoradiation therapy.  Found to have rhinovirus.  Started on therapy for COPD exacerbation.  Had progressive hypoxia and transferred to SDU 3/13 to start on Bipap.  Respiratory status and mental status got worse, and he required intubation on 3/14.  PCCM assumed care in ICU. ? ?Pertinent  Medical History  ?Severe COPD with emphysema, Depression, Anxiety, HTN, extensive stage SCLC ?Concurrent systemic chemotherapy and radiation treatment ?Rhinovirus infection ? ?Significant Hospital Events: ?Including procedures, antibiotic start and stop dates in addition to other pertinent events   ?3/12 Admit with dyspnea, AECOPD in setting of rhinovirus  ?3/14 PCCM consulted for evaluation of respiratory failure. Intubated for airway protection  ?3/17 On vent, XRT  ?3/18 tolerating pressure support today ?3/19 not weaning as well today with every episode of bronchospasms ?3/20 PRBC for hgb of 6.6, PSV wean & extubated.  Failed due to global weakness/inability to clear secretions > reintubated.  ?Tracheal aspirate negative normal flora ?3/22 extubated. D/w family and pt trach vs one-way extubation. Pt was non-committal. Was on NIPPV for about 8 hrs then had to be re-intubated ?3/23 vanc and cefepime started. On-going family and pt discussion. He agreed to trach  ?Tracheal aspirate-no growth ?MRSA PCR negative ?03/20/22- [place] and [name] ? ?Interim History / Subjective:  ? ?03/21/22 -status post tracheostomy 40% FiO2 10 L oxygen.  Currently on trach collar.  Overnight was on ventilator.  Also on fentanyl infusion and Precedex infusion.  Nursing reports that he gets very anxious when trying  to wean.  Nevertheless he sitting with tracheostomy and typing on the keyboard and asking appropriate questions.  On antibiotics cefepime.  On Jevity tube feeds.  Afebrile since 03/18/2022.  White count is coming down. ? ? ?Objective   ?BP (!) 145/84   Pulse 88   Temp 98.6 ?F (37 ?C) (Oral)   Resp 18   Ht 5\' 11"  (1.803 m)   Wt 49.8 kg   SpO2 100%   BMI 15.31 kg/m?  ? ?Vent Mode: PRVC ?FiO2 (%):  [40 %-60 %] 60 % ?Set Rate:  [14 bmp] 14 bmp ?Vt Set:  [620 mL] 620 mL ?PEEP:  [5 cmH20] 5 cmH20 ?Plateau Pressure:  [10 cmH20-18 cmH20] 11 cmH20  ? ?Intake/Output Summary (Last 24 hours) at 03/21/2022 1102 ?Last data filed at 03/21/2022 1000 ?Gross per 24 hour  ?Intake 2338.73 ml  ?Output 1850 ml  ?Net 488.73 ml  ? ?Examination: ?Extremely cachectic frail deconditioned male.  Barrel chested.  No muscle mass.  Has a tracheostomy has feeding tube.  He is sitting on the bed and typing on the keyboard.  Has fentanyl and Precedex infusion ongoing abdomen soft.  Right Port-A-Cath present.  Some of the phimosis from blood draw on his hands.  Moves all 4 extremities. ? ?Resolved Hospital Problem list   ?Acute metabolic encephalopathy ? ?Assessment & Plan:  ?Principal Problem: ?  Acute respiratory failure with hypoxia (HCC) ?Active Problems: ?  COPD exacerbation ([place]) ?  Anxiety ?  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  Anemia associated with chemotherapy ?  Essential hypertension ?  Metastasis to lymph nodes ([place]) ?  Protein-calorie malnutrition, severe ?  Depression ?  Physical deconditioning ? ? 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  S.p trach 03/21/22 ? ?03/21/2022 - doing ATC but on fent gtt/precedex gtt ? ?Plan ?PRVC  QHS and ATC in day time as tolerated (needs PRVC QHS due to  sedaion gtt) ?VAP bundle ?PAD protocol RASS goal 0 ?Antibiotics per below ? ?Aspiration PNA - Looks worse as of 3/23; PCT rising now s/p repeat intubation  ? ?3/25 - afebrile ? ?Plan ?Day #3 cefepime ?Follow-up pending cultures ? ?Limited stage small cell lung cancer ?On radiation therapy. S/p 1 cycle of chemo with cisplatin/etoposide  ?-followed by Dr. Lisbeth Renshaw & Dr. Earlie Server  ? ?Plan ?Follow-up after resolution of acute illness ? ?History of depression anxiety ? ?3/25 -according to nursing get significantly agitated when fentanyl infusion and Precedex infusion weaned.  However he is alert on these 2 infusions with the trach collar and maintaining airway and alertness. ? ? ?Plan ?PAD protocol ?Wean fentanyl infusion and Precedex infusion as tolerated. ? ?Protein calorie malnutrition ?Cachexia ? ?Plan ?TF ? ?Anemia Critical illness - Hemoglobin stable, likely a dilutional effect ? ?03/21/2022 - hgb  > 8 first one prob supruous today ? ?Plan ?Trend CBC ?Transfusion trigger for less than 7 ? ?HTN  ?Plan ?Continue Norvasc 2.5 mg with hold orders ? ? ?Best Practice (right click and "Reselect all SmartList Selections" daily)  ?Diet/type: tubefeeds ?DVT prophylaxis: LMWH ?GI prophylaxis: PPI ?Lines: Central line > port/chronic  ?Foley:  N/A ?Code Status:  full code ?Last date of multidisciplinary goals of care discussion: updated pt's girlfriend on 3/24.No  family at bedside ? ? ? ? ?[place]  ? ?The patient Mark Yu is critically ill with multiple organ systems failure and requires high complexity decision making for assessment and support, frequent evaluation and titration of therapies, application of advanced monitoring technologies and extensive interpretation of multiple databases.  ? ?Critical Care Time devoted to patient care services described in this note is  35  Minutes. This time reflects time of care of this signee Dr Brand Males. This critical care time does not reflect procedure time,  or teaching time or supervisory time of PA/NP/Med student/Med Resident etc but could involve care discussion time  ? ? ? ?Dr. Brand Males, M.D., F.C.C.P ?Pulmonary and Critical Care Medicine ?Staff Physician ?[facility] ?[facility] ?Pager: [phone], If no answer or between  15:00h - 7:00h: call [phone] ? ?03/21/2022 ?11:05 AM ? ? ? ?LABS  ? ? ?PULMONARY ?Recent Labs  ?Lab 03/18/22 ?2024 03/18/22 ?2215 03/21/22 ?1093  ?PHART 7.33* 7.35 7.35  ?PCO2ART 62* 58* 57*  ?PO2ART 54* 350* 56*  ?HCO3 33.0* 31.8* 32.0*  ?O2SAT 86.1 100 87.8  ? ? ?CBC ?Recent Labs  ?Lab 03/20/22 ?0442 03/21/22 ?2355 03/21/22 ?7322  ?HGB 7.7* 6.0* 8.8*  ?HCT 25.7* 19.9* 27.6*  ?WBC 10.1 11.4* 11.6*  ?PLT 398 312 355  ? ? ?COAGULATION ?Recent Labs  ?Lab 03/21/22 ?0254  ?INR 1.2  ? ? ?CARDIAC ? No results for input(s): TROPONINI in the last 168 hours. ?No results for input(s): PROBNP in the last 168 hours. ? ? ?CHEMISTRY ?Recent Labs  ?Lab 03/17/22 ?0717 03/18/22 ?0404 03/18/22 ?2029 03/19/22 ?2706 03/20/22 ?2376 03/21/22 ?2831  ?NA 141  140 142 142 139 137  ?K 4.0 3.6 4.0 3.5 4.1 3.9  ?CL 104 102 104 106 105 102  ?CO2 33* 32 30 31 31  32  ?GLUCOSE 114* 133* 129* 113* 101* 116*  ?BUN 29* 26* 23 25* 26* 25*  ?CREATININE <0.30* 0.35* 0.38* 0.44* 0.32* 0.36*  ?CALCIUM 8.4* 8.5* 8.9 8.8* 8.5* 8.1*  ?MG 2.1  --  2.2  --   --   --   ?PHOS 3.7  --   --   --   --   --   ? ?Estimated Creatinine Clearance: 64.8 mL/min (A) (by C-G formula based on SCr of 0.36 mg/dL (L)). ? ? ?LIVER ?Recent Labs  ?Lab 03/15/22 ?0559 03/21/22 ?0413 03/21/22 ?6438  ?AST 13*  --  16  ?ALT 19  --  17  ?ALKPHOS 54  --  59  ?BILITOT 0.2*  --  1.2  ?PROT 5.9*  --  6.1*  ?ALBUMIN 2.2*  --  2.3*  ?INR  --  1.2  --   ? ? ? ?INFECTIOUS ?Recent Labs  ?Lab 03/18/22 ?0952 03/19/22 ?3779 03/20/22 ?0442  ?PROCALCITON 0.11 0.31 0.25  ? ? ? ?ENDOCRINE ?CBG (last 3)  ?Recent Labs  ?  03/20/22 ?2345 03/21/22 ?0354 03/21/22 ?0737  ?GLUCAP 126* 97 108*   ? ? ? ? ? ? ? ?IMAGING x48h  - image(s) personally visualized  -   highlighted in bold ?DG Abd 1 View ? ?Result Date: 03/20/2022 ?CLINICAL DATA:  Feeding tube placement. EXAM: ABDOMEN - 1 VIEW COMPARISON:

## 2022-03-22 DIAGNOSIS — J9601 Acute respiratory failure with hypoxia: Secondary | ICD-10-CM | POA: Diagnosis not present

## 2022-03-22 LAB — GLUCOSE, CAPILLARY
Glucose-Capillary: 122 mg/dL — ABNORMAL HIGH (ref 70–99)
Glucose-Capillary: 101 mg/dL — ABNORMAL HIGH (ref 70–99)
Glucose-Capillary: 134 mg/dL — ABNORMAL HIGH (ref 70–99)
Glucose-Capillary: 128 mg/dL — ABNORMAL HIGH (ref 70–99)
Glucose-Capillary: 109 mg/dL — ABNORMAL HIGH (ref 70–99)

## 2022-03-22 LAB — CBC
HCT: 29 % — ABNORMAL LOW (ref 39.0–52.0)
Hemoglobin: 9.3 g/dL — ABNORMAL LOW (ref 13.0–17.0)
MCH: 30.6 pg (ref 26.0–34.0)
MCHC: 32.1 g/dL (ref 30.0–36.0)
MCV: 95.4 fL (ref 80.0–100.0)
Platelets: 382 10*3/uL (ref 150–400)
RBC: 3.04 MIL/uL — ABNORMAL LOW (ref 4.22–5.81)
RDW: 17.1 % — ABNORMAL HIGH (ref 11.5–15.5)
WBC: 10.7 10*3/uL — ABNORMAL HIGH (ref 4.0–10.5)
nRBC: 0 % (ref 0.0–0.2)

## 2022-03-22 LAB — COMPREHENSIVE METABOLIC PANEL
ALT: 19 U/L (ref 0–44)
AST: 18 U/L (ref 15–41)
Albumin: 2.5 g/dL — ABNORMAL LOW (ref 3.5–5.0)
Alkaline Phosphatase: 59 U/L (ref 38–126)
Anion gap: 7 (ref 5–15)
BUN: 22 mg/dL (ref 8–23)
CO2: 31 mmol/L (ref 22–32)
Calcium: 8.2 mg/dL — ABNORMAL LOW (ref 8.9–10.3)
Chloride: 98 mmol/L (ref 98–111)
Creatinine, Ser: 0.32 mg/dL — ABNORMAL LOW (ref 0.61–1.24)
GFR, Estimated: >60 mL/min (ref >60)
Glucose, Bld: 117 mg/dL — ABNORMAL HIGH (ref 70–99)
Potassium: 3.7 mmol/L (ref 3.5–5.1)
Sodium: 136 mmol/L (ref 135–145)
Total Bilirubin: 0.6 mg/dL (ref 0.3–1.2)
Total Protein: 6.2 g/dL — ABNORMAL LOW (ref 6.5–8.1)

## 2022-03-22 LAB — PHOSPHORUS: Phosphorus: 3.3 mg/dL (ref 2.5–4.6)

## 2022-03-22 LAB — MAGNESIUM: Magnesium: 2.1 mg/dL (ref 1.7–2.4)

## 2022-03-22 MED ORDER — MIDAZOLAM HCL 2 MG/2ML IJ SOLN
1.0000 mg | INTRAMUSCULAR | Status: DC | PRN
  Administered 2022-03-23 – 2022-03-26 (×6): 2 mg via INTRAVENOUS
  Filled 2022-03-22 (×6): qty 2

## 2022-03-22 MED ORDER — LORAZEPAM 2 MG/ML IJ SOLN
1.0000 mg | Freq: Once | INTRAMUSCULAR | Status: AC
  Administered 2022-03-22: 1 mg via INTRAVENOUS
  Filled 2022-03-22: qty 1

## 2022-03-22 MED ORDER — ALPRAZOLAM 1 MG PO TABS
1.0000 mg | ORAL_TABLET | Freq: Two times a day (BID) | ORAL | Status: DC
  Administered 2022-03-22 – 2022-03-24 (×5): 1 mg
  Filled 2022-03-22 (×5): qty 1

## 2022-03-22 NOTE — Progress Notes (Signed)
PT Cancellation Note ? ?Patient Details ?Name: Mark Yu ?MRN: [id number] ?DOB: 1956-10-28 ? ? ?Cancelled Treatment:     PT eval attempted this am but deferred at request of RN - pt not completely weaned from vent and currently working on "getting him a trach collar".  Will follow ? ? ?Chai Routh ?03/22/2022, 3:51 PM ?

## 2022-03-22 NOTE — Progress Notes (Signed)
? ?NAME:  Mark Yu, MRN:  [id number], DOB:  [birth year], LOS: 72 ?ADMISSION DATE:  03/08/2022, CONSULTATION DATE:  3/14 ?REFERRING MD: Dr. Loleta Yu, CHIEF COMPLAINT: SOB  ? ?HBRIEF  ?66 yo male smoker presented to [facility] with dyspnea.  He has hx of severe COPD with emphysema and recent diagnosis of extensive stage SCLC on concurrent chemoradiation therapy.  Found to have rhinovirus.  Started on therapy for COPD exacerbation.  Had progressive hypoxia and transferred to SDU 3/13 to start on Bipap.  Respiratory status and mental status got worse, and he required intubation on 3/14.  PCCM assumed care in ICU. ? ?Pertinent  Medical History  ?Severe COPD with emphysema, Depression, Anxiety, HTN, extensive stage SCLC ?Concurrent systemic chemotherapy and radiation treatment ?Rhinovirus infection ? ?Significant Hospital Events: ?Including procedures, antibiotic start and stop dates in addition to other pertinent events   ?3/12 Admit with dyspnea, AECOPD in setting of rhinovirus  ?3/14 PCCM consulted for evaluation of respiratory failure. Intubated for airway protection  ?3/17 On vent, XRT  ?3/18 tolerating pressure support today ?3/19 not weaning as well today with every episode of bronchospasms ?3/20 PRBC for hgb of 6.6, PSV wean & extubated.  Failed due to global weakness/inability to clear secretions > reintubated.  ?Tracheal aspirate negative normal flora ?3/22 extubated. D/w family and pt trach vs one-way extubation. Pt was non-committal. Was on NIPPV for about 8 hrs then had to be re-intubated ?3/23 vanc and cefepime started. On-going family and pt discussion. He agreed to trach  ?Tracheal aspirate-no growth ?MRSA PCR negative ?03/20/22- [facility]ACH - Mark Yu and Mark Yu ?03/21/22 -status post tracheostomy 40% FiO2 10 L oxygen.  Currently on trach collar.  Overnight was on ventilator.  Also on fentanyl infusion and Precedex infusion.  Nursing reports that he gets very anxious when trying to wean.  Nevertheless he sitting  with tracheostomy and typing on the keyboard and asking appropriate questions.  On antibiotics cefepime.  On Jevity tube feeds.  Afebrile since 03/18/2022.  White count is coming down. ? ?Interim History / Subjective:  ? ?3/26 - ws verry anxious on ATC with precedex.  Apparently at home he takes a Xanax.  Oral Xanax was given and after that he started sleeping.  Had mild tracheal slight wooziness today but this is now stopped. ATC currently. Was on PSV. Cureently off fent gtt ? ?Objective   ?BP (!) 96/58   Pulse 67   Temp 98.2 ?F (36.8 ?C) (Oral)   Resp (!) 27   Ht 5\' 11"  (1.803 m)   Wt 49.8 kg   SpO2 100%   BMI 15.31 kg/m?  ? ?Vent Mode: PSV;CPAP ?FiO2 (%):  [35 %-40 %] 35 % ?Set Rate:  [14 bmp] 14 bmp ?PEEP:  [5 cmH20] 5 cmH20 ?Pressure Support:  [5 cmH20] 5 cmH20 ?Plateau Pressure:  [11 cmH20-17 cmH20] 17 cmH20  ? ?Intake/Output Summary (Last 24 hours) at 03/22/2022 1420 ?Last data filed at 03/22/2022 1230 ?Gross per 24 hour  ?Intake 2049.12 ml  ?Output 2250 ml  ?Net -200.88 ml  ? ?Examination: ?Extremely frail cachectic male.  He is right now sleeping after Xanax.  Tracheal site had some dried blood.  But otherwise looks clean.  He is on a trach collar.  Extremely lean body mass.  Clear to auscultation barrel chested normal heart sounds.  Abdomen soft.  No cyanosis no clubbing no edema.  Skin is some ecchymosis. ? ? ? ? ?Resolved Hospital Problem list   ?Acute metabolic encephalopathy ? ?Assessment &  Plan:  ?Principal Problem: ?  Acute respiratory failure with hypoxia ([place]) ?Active Problems: ?  COPD exacerbation ([place]) ?  Anxiety ?  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  Anemia associated with chemotherapy ?  Essential hypertension ?  Metastasis to lymph nodes ([place]) ?  Protein-calorie malnutrition, severe ?  Depression ?  Physical deconditioning ? ? ?Acute on chronic hypoxic/hypercapnic respiratory failure from rhinovirus pneumonia w/ COPD exacerbation ?Tobacco Abuse with Severe COPD with  extensive centrilobular and bullous emphysema ?Failed extubation 3/20 and again on 3/22 poor secretion management and endurance  .  S.p trach 03/21/22 ? ?03/22/2022 -doing a trach collar.  Off fentanyl infusion.  On Precedex infusion.  No distress after being given home Xanax ? ?Plan ?PRVC  QHS and ATC in day time as tolerated ; can try pressure support at night if not on sedation infusion  ?VAP bundle ?PAD protocol RASS goal 0 ?Antibiotics per below ? ?Aspiration PNA - Looks worse as of 3/23; PCT rising now s/p repeat intubation .  Culture negative tracheal aspirate 03/19/2022 ? ?3/26 - afebrile ? ?Plan ?Day #4 cefepime - > stop after day 5 on 03/16/2022 ?Follow-up pending cultures ? ?Limited stage small cell lung cancer ?On radiation therapy. S/p 1 cycle of chemo with cisplatin/etoposide  ?-followed by Dr. Lisbeth Yu & Dr. Earlie Yu  ? ?Plan ?Follow-up after resolution of acute illness ? ?History of depression anxiety ? ?3/26  -reports that he takes Xanax at home.  Was given Xanax here and after that his anxiety resolved.  And he started sleeping.  Off fentanyl infusion ? ?Plan ?PAD protocol ?DC fentanyl infusion from [facility] ?Continue Precedex infusion but wean as tolerated ?Schedule Xanax but can later taper it off ? ?Protein calorie malnutrition ?Cachexia ? ?Plan ? ?TF ? ?Anemia Critical illness - Hemoglobin stable, likely a dilutional effect ? ?03/22/2022 - hgb 9.3 g% ? ?Plan ?Trend CBC ?Transfusion trigger for less than 7 ? ?HTN  ?Plan ?Continue Norvasc 2.5 mg with hold orders ? ? ?Best Practice (right click and "Reselect all SmartList Selections" daily)  ?Diet/type: tubefeeds ?DVT prophylaxis: LMWH ?GI prophylaxis: PPI ?Lines: Central line > port/chronic  ?Foley:  N/A ?Code Status:  full code ? ?Last date of multidisciplinary goals of care discussion: updated pt's girlfriend on 3/24.No  family at bedside 03/21/2022 and 03/22/2022. Mark Yu called 3/26 but went to VM and phone disconnected 2:27 PM   Likely  needs LTAC ? ? ? ? ?[place]  ? ?The patient Mark Yu is critically ill with multiple organ systems failure and requires high complexity decision making for assessment and support, frequent evaluation and titration of therapies, application of advanced monitoring technologies and extensive interpretation of multiple databases.  ? ?Critical Care Time devoted to patient care services described in this note is  31  Minutes. This time reflects time of care of this signee Dr Brand Males. This critical care time does not reflect procedure time, or teaching time or supervisory time of PA/NP/Med student/Med Resident etc but could involve care discussion time  ? ? ? ?Dr. Brand Males, M.D., F.C.C.P ?Pulmonary and Critical Care Medicine ?Staff Physician ?[facility] ?[facility] ?Pager: [phone], If no answer or between  15:00h - 7:00h: call [phone] ? ?03/22/2022 ?2:27 PM ? ? ? ? ?LABS  ? ? ?PULMONARY ?Recent Labs  ?Lab 03/18/22 ?2024 03/18/22 ?2215 03/21/22 ?9147  ?PHART 7.33* 7.35 7.35  ?  PCO2ART 62* 58* 57*  ?PO2ART 54* 350* 56*  ?HCO3 33.0* 31.8* 32.0*  ?O2SAT 86.1 100 87.8  ? ? ?CBC ?Recent Labs  ?Lab 03/21/22 ?6546 03/21/22 ?5035 03/22/22 ?4656  ?HGB 6.0* 8.8* 9.3*  ?HCT 19.9* 27.6* 29.0*  ?WBC 11.4* 11.6* 10.7*  ?PLT 312 355 382  ? ? ?COAGULATION ?Recent Labs  ?Lab 03/21/22 ?8127  ?INR 1.2  ? ? ?CARDIAC ? No results for input(s): TROPONINI in the last 168 hours. ?No results for input(s): PROBNP in the last 168 hours. ? ? ?CHEMISTRY ?Recent Labs  ?Lab 03/17/22 ?0717 03/18/22 ?0404 03/18/22 ?2029 03/19/22 ?5170 03/20/22 ?0174 03/21/22 ?9449 03/22/22 ?0320  ?NA 141   < > 142 142 139 137 136  ?K 4.0   < > 4.0 3.5 4.1 3.9 3.7  ?CL 104   < > 104 106 105 102 98  ?CO2 33*   < > 30 31 31  32 31  ?GLUCOSE 114*   < > 129* 113* 101* 116* 117*  ?BUN 29*   < > 23 25* 26* 25* 22  ?CREATININE <0.30*   < > 0.38* 0.44* 0.32* 0.36* 0.32*  ?CALCIUM 8.4*   < > 8.9 8.8*  8.5* 8.1* 8.2*  ?MG 2.1  --  2.2  --   --   --  2.1  ?PHOS 3.7  --   --   --   --   --  3.3  ? < > = values in this interval not displayed.  ? ?Estimated Creatinine Clearance: 64.8 mL/min (A) (by C-G formula based

## 2022-03-23 ENCOUNTER — Ambulatory Visit: Payer: Medicare Other

## 2022-03-23 ENCOUNTER — Encounter: Payer: Self-pay | Admitting: *Deleted

## 2022-03-23 ENCOUNTER — Inpatient Hospital Stay: Payer: Medicare Other | Admitting: Internal Medicine

## 2022-03-23 ENCOUNTER — Inpatient Hospital Stay: Payer: Medicare Other

## 2022-03-23 DIAGNOSIS — J9601 Acute respiratory failure with hypoxia: Secondary | ICD-10-CM | POA: Diagnosis not present

## 2022-03-23 LAB — CBC
HCT: 28 % — ABNORMAL LOW (ref 39.0–52.0)
Hemoglobin: 9.1 g/dL — ABNORMAL LOW (ref 13.0–17.0)
MCH: 31.1 pg (ref 26.0–34.0)
MCHC: 32.5 g/dL (ref 30.0–36.0)
MCV: 95.6 fL (ref 80.0–100.0)
Platelets: 302 10*3/uL (ref 150–400)
RBC: 2.93 MIL/uL — ABNORMAL LOW (ref 4.22–5.81)
RDW: 16.9 % — ABNORMAL HIGH (ref 11.5–15.5)
WBC: 7.2 10*3/uL (ref 4.0–10.5)
nRBC: 0 % (ref 0.0–0.2)

## 2022-03-23 LAB — GLUCOSE, CAPILLARY
Glucose-Capillary: 112 mg/dL — ABNORMAL HIGH (ref 70–99)
Glucose-Capillary: 120 mg/dL — ABNORMAL HIGH (ref 70–99)
Glucose-Capillary: 122 mg/dL — ABNORMAL HIGH (ref 70–99)
Glucose-Capillary: 123 mg/dL — ABNORMAL HIGH (ref 70–99)
Glucose-Capillary: 129 mg/dL — ABNORMAL HIGH (ref 70–99)
Glucose-Capillary: 148 mg/dL — ABNORMAL HIGH (ref 70–99)

## 2022-03-23 LAB — BASIC METABOLIC PANEL
Anion gap: 6 (ref 5–15)
BUN: 20 mg/dL (ref 8–23)
CO2: 32 mmol/L (ref 22–32)
Calcium: 8.3 mg/dL — ABNORMAL LOW (ref 8.9–10.3)
Chloride: 97 mmol/L — ABNORMAL LOW (ref 98–111)
Creatinine, Ser: 0.33 mg/dL — ABNORMAL LOW (ref 0.61–1.24)
GFR, Estimated: >60 mL/min (ref >60)
Glucose, Bld: 137 mg/dL — ABNORMAL HIGH (ref 70–99)
Potassium: 3.8 mmol/L (ref 3.5–5.1)
Sodium: 135 mmol/L (ref 135–145)

## 2022-03-23 LAB — MAGNESIUM: Magnesium: 2.1 mg/dL (ref 1.7–2.4)

## 2022-03-23 MED ORDER — PREDNISONE 10 MG PO TABS
10.0000 mg | ORAL_TABLET | Freq: Every day | ORAL | Status: DC
  Administered 2022-03-24 – 2022-03-26 (×3): 10 mg
  Filled 2022-03-23 (×3): qty 1

## 2022-03-23 MED ORDER — LOPERAMIDE HCL 1 MG/7.5ML PO SUSP
2.0000 mg | ORAL | Status: DC | PRN
  Administered 2022-03-23: 2 mg
  Filled 2022-03-23 (×3): qty 15

## 2022-03-23 NOTE — Progress Notes (Signed)
? ?NAME:  Mark Yu, MRN:  [id number], DOB:  July 12, 1956, LOS: 46 ?ADMISSION DATE:  03/08/2022, CONSULTATION DATE:  3/14 ?REFERRING MD:  [place], CHIEF COMPLAINT:  Dyspnea  ? ?History of Present Illness:  ?66 y/o male presented with limited stage small cell lung cancer and COPD admitted with acute on chronic respiratory failure with hypoxemia due to rhinovirus requiring intubation on 3/14.   ? ?Pertinent  Medical History  ?Severe COPD with emphysema, Depression, Anxiety, HTN, extensive stage SCLC ?Concurrent systemic chemotherapy and radiation treatment ?Rhinovirus infection ? ?Significant Hospital Events: ?Including procedures, antibiotic start and stop dates in addition to other pertinent events   ?3/12 Admit with dyspnea, AECOPD in setting of rhinovirus  ?3/14 PCCM consulted for evaluation of respiratory failure. Intubated for airway protection  ?3/17 On vent, XRT  ?3/18 tolerating pressure support today ?3/19 not weaning as well today with every episode of bronchospasms ?3/20 PRBC for hgb of 6.6, PSV wean & extubated.  Failed due to global weakness/inability to clear secretions > reintubated.  ?Tracheal aspirate negative normal flora ?3/22 extubated. D/w family and pt trach vs one-way extubation. Pt was non-committal. Was on NIPPV for about 8 hrs then had to be re-intubated ?3/23 vanc and cefepime started. On-going family and pt discussion. He agreed to trach  ?Tracheal aspirate-no growth ?MRSA PCR negative ?03/20/22- TR[facility] - [place] and [name] ?03/21/22 -status post tracheostomy 40% FiO2 10 L oxygen.  Currently on trach collar.  Overnight was on ventilator.  Also on fentanyl infusion and Precedex infusion.  Nursing reports that he gets very anxious when trying to wean.  Nevertheless he sitting with tracheostomy and typing on the keyboard and asking appropriate questions.  On antibiotics cefepime.  On Jevity tube feeds.  Afebrile since 03/18/2022.  White count is coming down. ? ?Interim History / Subjective:   ?Feels better today ?On trach collar this morning ?Still has thick secretions ? ?Objective   ?Blood pressure 130/72, pulse 88, temperature 98.6 ?F (37 ?C), temperature source Oral, resp. rate (!) 29, height 5\' 11"  (1.803 m), weight 49.8 kg, SpO2 99 %. ?   ?Vent Mode: PSV;CPAP ?FiO2 (%):  [35 %-40 %] 35 % ?PEEP:  [5 cmH20] 5 cmH20 ?Pressure Support:  [5 cmH20] 5 cmH20 ?Plateau Pressure:  [12 cmH20-15 cmH20] 15 cmH20  ? ?Intake/Output Summary (Last 24 hours) at 03/23/2022 0916 ?Last data filed at 03/23/2022 2683 ?Gross per 24 hour  ?Intake 2194.44 ml  ?Output 1500 ml  ?Net 694.44 ml  ? ?Filed Weights  ? 03/21/22 0500 03/22/22 0500 03/23/22 0500  ?Weight: 49.8 kg 49.8 kg 49.8 kg  ? ? ?Examination: ? ?General:  chronically ill appearing, resting comfortably in bed ?HENT: NCAT OP clear temporal wasting ?PULM: Rhonchi bilaterally, normal effort ?CV: RRR, no mgr ?GI: BS+, soft, nontender ?MSK: diminished bulk and tone ?Neuro: awake, alert, no distress, MAEW ? ?Resolved Hospital Problem list   ? ? ?Assessment & Plan:  ?Principal Problem: ?  Acute respiratory failure with hypoxia (HCC) ?Active Problems: ?  COPD exacerbation ([place]) ?  Anxiety ?  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  Anemia associated with chemotherapy ?  Essential hypertension ?  Metastasis to lymph nodes ([place]) ?  Protein-calorie malnutrition, severe ?  Depression ?  Physical deconditioning ? ?Acute on chronic hypoxemic/hypercarbic respiratory failure from rhinovirus pneumonia with COPD exacerbation  ?Cigarette smoker ?Baseline severe COPD with extensive centrilobular and bullous emphysema ?Tracheostomy status with some bleeding now improved ?Needs LTACH ?Trach collar per  routine ?Ventilator at night ?Brovana/pulmicort/yupelri ?Prednisone 10mg  daliy x 5 days then stop ? ?Aspiration pneumonia ?Stop cefepime after 5 days ? ?Limited stage small cell lung cancer ?Continue XRT ?F/u heme/onc recommendations ? ?Depression and anxiety ?Xanax to  continue ? ?Protein calorie malnutrition, severe ?Cachexia ?Tube feeding to continue ?Hoping he can swallow and we can avoid a PEG ?Vitamin supplementation to continue ? ?Anemia of chronic illness ?Monitor for bleeding ?Transfuse PRBC for Hgb < 7 gm/dL ? ?Hypertension ?amlodipine ? ?Best Practice (right click and "Reselect all SmartList Selections" daily)  ? ?Diet/type: tubefeeds ?DVT prophylaxis: SCD > consider restart sub q heparin this week ?GI prophylaxis: PPI ?Lines: N/A ?Foley:  N/A ?Code Status:  full code ?Last date of multidisciplinary goals of care discussion [3/24 with Chase Caller; I updated his family bedside on 3/27] ? ?Labs   ?CBC: ?Recent Labs  ?Lab 03/20/22 ?0442 03/21/22 ?0539 03/21/22 ?7673 03/22/22 ?4193 03/23/22 ?0409  ?WBC 10.1 11.4* 11.6* 10.7* 7.2  ?HGB 7.7* 6.0* 8.8* 9.3* 9.1*  ?HCT 25.7* 19.9* 27.6* 29.0* 28.0*  ?MCV 100.8* 100.5* 97.5 95.4 95.6  ?PLT 398 312 355 382 302  ? ? ?Basic Metabolic Panel: ?Recent Labs  ?Lab 03/17/22 ?0717 03/18/22 ?0404 03/18/22 ?2029 03/19/22 ?7902 03/20/22 ?4097 03/21/22 ?3532 03/22/22 ?0320 03/23/22 ?0409  ?NA 141   < > 142 142 139 137 136 135  ?K 4.0   < > 4.0 3.5 4.1 3.9 3.7 3.8  ?CL 104   < > 104 106 105 102 98 97*  ?CO2 33*   < > 30 31 31  32 31 32  ?GLUCOSE 114*   < > 129* 113* 101* 116* 117* 137*  ?BUN 29*   < > 23 25* 26* 25* 22 20  ?CREATININE <0.30*   < > 0.38* 0.44* 0.32* 0.36* 0.32* 0.33*  ?CALCIUM 8.4*   < > 8.9 8.8* 8.5* 8.1* 8.2* 8.3*  ?MG 2.1  --  2.2  --   --   --  2.1 2.1  ?PHOS 3.7  --   --   --   --   --  3.3  --   ? < > = values in this interval not displayed.  ? ?GFR: ?Estimated Creatinine Clearance: 64.8 mL/min (A) (by C-G formula based on SCr of 0.33 mg/dL (L)). ?Recent Labs  ?Lab 03/18/22 ?0952 03/18/22 ?2035 03/19/22 ?9924 03/19/22 ?1044 03/20/22 ?0442 03/21/22 ?2683 03/21/22 ?4196 03/22/22 ?2229 03/23/22 ?0409  ?PROCALCITON 0.11  --  0.31  --  0.25  --   --   --   --   ?WBC  --    < > 16.0*   < > 10.1 11.4* 11.6* 10.7* 7.2  ? < > = values  in this interval not displayed.  ? ? ?Liver Function Tests: ?Recent Labs  ?Lab 03/21/22 ?7989 03/22/22 ?0320  ?AST 16 18  ?ALT 17 19  ?ALKPHOS 59 59  ?BILITOT 1.2 0.6  ?PROT 6.1* 6.2*  ?ALBUMIN 2.3* 2.5*  ? ?No results for input(s): LIPASE, AMYLASE in the last 168 hours. ?No results for input(s): AMMONIA in the last 168 hours. ? ?ABG ?   ?Component Value Date/Time  ? PHART 7.35 03/21/2022 0054  ? PCO2ART 57 (H) 03/21/2022 0054  ? PO2ART 56 (L) 03/21/2022 0054  ? HCO3 32.0 (H) 03/21/2022 0054  ? ACIDBASEDEF 4.6 (H) 03/09/2022 1836  ? O2SAT 87.8 03/21/2022 0054  ?  ? ?Coagulation Profile: ?Recent Labs  ?Lab 03/21/22 ?2119  ?INR 1.2  ? ? ?Cardiac Enzymes: ?No  results for input(s): CKTOTAL, CKMB, CKMBINDEX, TROPONINI in the last 168 hours. ? ?HbA1C: ?No results found for: HGBA1C ? ?CBG: ?Recent Labs  ?Lab 03/22/22 ?1549 03/22/22 ?1949 03/23/22 ?0001 03/23/22 ?0346 03/23/22 ?0801  ?GLUCAP 128* 109* 120* 148* 129*  ? ? ?Critical care time: 32 minutes ?  ? ?Roselie Awkward, MD ?[facility] ?Pager: [phone] ?Cell: [phone] ?After 7:00 pm call Elink  [phone] ? ?

## 2022-03-23 NOTE — Progress Notes (Signed)
Spoke with Elink nurse about Placing Pt back on PRVC full vent support. Orders had been changed to leave Pt on cpap/ps, but PT was experiencing low Vt, and increased RR, and WOB, I increased Pressure support, but it did not seem to help. PT now on Prvc 8cc R14.  ?

## 2022-03-23 NOTE — Progress Notes (Signed)
Patient requesting to skip radiation today to rest. Oncology department and Dr Lake Bells notified by this RN. Pt did say he was agreeable to receive scheduled chemo if a chemo nurse was able to administer at bedside. Oncology department notified by this RN; awaiting return call at this time whether or not chemo nurse available today.  ?

## 2022-03-23 NOTE — Evaluation (Signed)
Physical Therapy Evaluation ?Patient Details ?Name: Mark Yu ?MRN: [id number] ?DOB: 1956/01/27 ?Today's Date: 03/23/2022 ? ?History of Present Illness ? 66 yo male smoker presented to [facility] with dyspnea.   Found to have rhinovirus, pna, COPD exacerbation. had progressive hypoxia and transferred to SDU 3/13 and started on Bipap.  Respiratory status and mental status worsened,  intubated on 3/14, Ext 3/22 and re-intubated, trach 3/24.  PMH: COPD with emphysema and recent diagnosis of extensive stage SCLC on concurrent chemoradiation therapy.  ?Clinical Impression ? Pt admitted with above diagnosis.  ?Pt seen for limited bed level eval on advice of nursing staff to not attempt more mobility today.  PT particiapted in movement and strength testing of all extremities. Participated well and asking appropriate questions on keyboard.  Session also limited as pt girlfriend expressed extreme frustration/borderline anger at PT, stating pt needed to rest/nap. Pt was agreeable throughout. RN and NT also in room prior to PT's departure.   ?Will continue to follow. Per chart family is planning for LTACH ?Pt VSS during limited eval, demonstrated productive cough with SpO2 in 90s on 35% FiO2, 8L. HR 80s, RR 20s ? ? Pt currently with functional limitations due to the deficits listed below (see PT Problem List). Pt will benefit from skilled PT to increase their independence and safety with mobility to allow discharge to the venue listed below.   ?   ?   ? ?Recommendations for follow up therapy are one component of a multi-disciplinary discharge planning process, led by the attending physician.  Recommendations may be updated based on patient status, additional functional criteria and insurance authorization. ? ?Follow Up Recommendations Skilled nursing-short term rehab (<3 hours/day) (vs LTACH) ? ?  ?Assistance Recommended at Discharge Frequent or constant Supervision/Assistance  ?Patient can return home with the following ? A lot  of help with bathing/dressing/bathroom;Two people to help with bathing/dressing/bathroom;Help with stairs or ramp for entrance;Direct supervision/assist for medications management;Assistance with feeding;Assist for transportation;Assistance with cooking/housework ? ?  ?Equipment Recommendations Other (comment) (TBD)  ?Recommendations for Other Services ?    ?  ?Functional Status Assessment Patient has had a recent decline in their functional status and demonstrates the ability to make significant improvements in function in a reasonable and predictable amount of time.  ? ?  ?Precautions / Restrictions Precautions ?Precautions: Fall ?Precaution Comments: T-collar, panda, multiple lines and leads/monitor VS ?Restrictions ?Weight Bearing Restrictions: No  ? ?  ? ?Mobility ? Bed Mobility ?  ?  ?  ?  ?  ?  ?  ?General bed mobility comments: NT today on advice of nursing ?  ? ?Transfers ?  ?  ?  ?  ?  ?  ?  ?  ?  ?  ?  ? ?Ambulation/Gait ?  ?  ?  ?  ?  ?  ?  ?  ? ?Stairs ?  ?  ?  ?  ?  ? ?Wheelchair Mobility ?  ? ?Modified Rankin (Stroke Patients Only) ?  ? ?  ? ?Balance   ?  ?  ?  ?  ?  ?  ?  ?  ?  ?  ?  ?  ?  ?  ?  ?  ?  ?  ?   ? ? ? ?Pertinent Vitals/Pain Pain Assessment ?Pain Assessment: No/denies pain  ? ? ?Home Living Family/patient expects to be discharged to:: Unsure ?Living Arrangements: Spouse/significant other ?Available Help at Discharge: Family ?  ?  ?  ?  ?  ?  ?  ?   ?  ?  Prior Function Prior Level of Function : Independent/Modified Independent ?  ?  ?  ?  ?  ?  ?Mobility Comments: per pt and his girlfriend of 17 yrs, pt was independent prior to this admssion ?  ?  ? ? ?Hand Dominance  ? Dominant Hand: Right ? ?  ?Extremity/Trunk Assessment  ? Upper Extremity Assessment ?Upper Extremity Assessment: Defer to OT evaluation ?  ? ?Lower Extremity Assessment ?Lower Extremity Assessment: RLE deficits/detail;LLE deficits/detail ?RLE Deficits / Details: AAROM grossly WFL, strength 2 to 2+/5, diffuse muscle atrophy,  feet edematous--sensation improved per pt report ?RLE Sensation: decreased light touch ?RLE Coordination: decreased gross motor;decreased fine motor ?LLE Deficits / Details: AAROM grossly WFL, strength 2 to 2+/5, diffuse muscle atrophy, feet edematous--sensation improved per pt report ?LLE Sensation: decreased light touch ?LLE Coordination: decreased fine motor;decreased gross motor ?  ? ?   ?Communication  ? Communication: Tracheostomy  ?Cognition Arousal/Alertness: Awake/alert ?Behavior During Therapy: [facility] for tasks assessed/performed ?Overall Cognitive Status: Within Functional Limits for tasks assessed ?  ?  ?  ?  ?  ?  ?  ?  ?  ?  ?  ?  ?  ?  ?  ?  ?General Comments: appears WFL per computer communication and alphabet board ?  ?  ? ?  ?General Comments   ? ?  ?Exercises General Exercises - Lower Extremity ?Ankle Circles/Pumps: AROM, Both, 5 reps  ? ?Assessment/Plan  ?  ?PT Assessment Patient needs continued PT services  ?PT Problem List Decreased strength;Decreased mobility;Decreased coordination;Decreased activity tolerance;Decreased balance;Decreased knowledge of use of DME;Cardiopulmonary status limiting activity ? ?   ?  ?PT Treatment Interventions DME instruction;Therapeutic exercise;Gait training;Functional mobility training;Therapeutic activities;Patient/family education   ? ?PT Goals (Current goals can be found in the Care Plan section)  ?Acute Rehab PT Goals ?Patient Stated Goal: get stronger ?PT Goal Formulation: With patient ?Time For Goal Achievement: 04/06/22 ?Potential to Achieve Goals: Fair ? ?  ?Frequency Min 2X/week ?  ? ? ?Co-evaluation   ?  ?  ?  ?  ? ? ?  ?AM-PAC PT "6 Clicks" Mobility  ?Outcome Measure Help needed turning from your back to your side while in a flat bed without using bedrails?: Total ?Help needed moving from lying on your back to sitting on the side of a flat bed without using bedrails?: Total ?Help needed moving to and from a bed to a chair (including a wheelchair)?:  Total ?Help needed standing up from a chair using your arms (e.g., wheelchair or bedside chair)?: Total ?Help needed to walk in hospital room?: Total ?Help needed climbing 3-5 steps with a railing? : Total ?6 Click Score: 6 ? ?  ?End of Session   ?Activity Tolerance: Patient limited by fatigue;Other (comment) (pulmonary status) ?Patient left: in bed;with nursing/sitter in room;with family/visitor present ?  ?PT Visit Diagnosis: Other abnormalities of gait and mobility (R26.89);Difficulty in walking, not elsewhere classified (R26.2);Muscle weakness (generalized) (M62.81) ?  ? ?Time: 4628-6381 ?PT Time Calculation (min) (ACUTE ONLY): 16 min ? ? ?Charges:   PT Evaluation ?$PT Eval Low Complexity: 1 Low ?  ?  ?   ? ? ?Baxter Flattery, PT ? ?Acute Rehab Dept [facility]) [phone] ?Pager [phone] ? ?03/23/2022 ? ? ?Mark Yu ?03/23/2022, 12:46 PM ? ?

## 2022-03-23 NOTE — Progress Notes (Signed)
SLP Cancellation Note ? ?Patient Details ?Name: Mark Yu ?MRN: [id number] ?DOB: Apr 27, 1956 ? ? ?Cancelled treatment:       Reason Eval/Treat Not Completed: Patient not medically ready;Other (comment) (Spoke with patient's RN who reported patient is not ready for PMV eval as he still has a lot of secretions. SLP will follow up next date for PMV eval readiness.) ? ?Sonia Baller, MA, CCC-SLP ?Speech Therapy ? ?

## 2022-03-23 NOTE — Progress Notes (Signed)
OT Cancellation Note ? ?Patient Details ?Name: Mark Yu ?MRN: [id number] ?DOB: 09-Nov-1956 ? ? ?Cancelled Treatment:    Reason Eval/Treat Not Completed: Other (comment) ?Pre nurse report, Patient sleeping at this time. OT to continue to follow and check back as schedule will allow.  ? ?Gwenna Fuston OTR/L, MS ?Acute Rehabilitation Department ?Office# [phone] ?Pager# [phone] ? ? ?03/23/2022, 2:43 PM ?

## 2022-03-23 NOTE — Progress Notes (Signed)
Oncology Nurse Navigator Documentation ? ? ?  03/23/2022  ?  4:00 PM 02/09/2022  ?  4:00 PM 02/04/2022  ?  9:00 AM 01/14/2022  ?  8:00 AM 01/13/2022  ? 12:00 PM 01/09/2022  ? 10:00 AM 01/06/2022  ? 11:00 AM  ?Oncology Nurse Navigator Flowsheets  ?Abnormal Finding Date       01/05/2022  ?Confirmed Diagnosis Date   02/02/2022      ?Diagnosis Status  Confirmed Diagnosis Complete Pathology Pending    Additional Work Up  ?Planned Course of Treatment Chemo/Radiation Concurrent        ?Phase of Treatment Radiation        ?Chemotherapy Actual Start Date: 02/18/2022        ?Radiation Actual Start Date: 02/17/2022        ?Navigator Follow Up Date: 03/26/2022 02/12/2022 02/09/2022 01/23/2022 01/16/2022 01/12/2022 01/08/2022  ?Navigator Follow Up Reason: Appointment Review Appointment Review Follow-up After Biopsy Radiology Appointment Review Appointment Review New Patient Appointment  ?Navigator Location [facility]  ?Referral Date to RadOnc/MedOnc       01/06/2022  ?Navigator Encounter Type Appt/Treatment Plan Review Clinic/MDC Telephone Other: Other: Other: Telephone  ?Telephone   Outgoing Call    [place] Call  ?Multidisiplinary Clinic Date      01/08/2022   ?Multidisiplinary Clinic Type      Thoracic   ?Patient Visit Type  MedOnc Other   Other Initial  ?Treatment Phase Treatment Pre-Tx/Tx Discussion Pre-Tx/Tx Discussion Abnormal Scans Abnormal Scans Abnormal Scans Abnormal Scans  ?Barriers/Navigation Needs Coordination of Care/Mark Yu was a no show to his appt with Dr. Julien Nordmann today due to his hospitalization. His next systemic tx is on 4/17.  Will follow up with Dr. Julien Nordmann if this is ok plan.   Education Coordination of Care;Education Coordination of Care Coordination of Care Coordination of Care Coordination of Care;Education  ?Education  Newly Diagnosed Cancer Education;Other Other    Other  ?Interventions Coordination of Care  Coordination of Care;Psycho-Social Support Coordination of Care;Education;Psycho-Social Support Coordination of Care Coordination of Care Coordination of Care Coordination of Care;Education;Psycho-Social Support  ?Acuity Level 2-Minimal Needs (1-2 Barriers Identified) Level 3-Moderate Needs (3-4 Barriers Identified) Level 2-Minimal Needs (1-2 Barriers Identified) Level 2-Minimal Needs (1-2 Barriers Identified) Level 2-Minimal Needs (1-2 Barriers Identified) Level 2-Minimal Needs (1-2 Barriers Identified) Level 2-Minimal Needs (1-2 Barriers Identified)  ?Coordination of Care   Appts Other Other Other Appts  ?Education Method  Verbal;Other Verbal    Verbal  ?Time Spent with Patient 30 30 30 15 30 15 30   ?  ?

## 2022-03-24 ENCOUNTER — Inpatient Hospital Stay: Payer: Medicare Other

## 2022-03-24 ENCOUNTER — Inpatient Hospital Stay (HOSPITAL_COMMUNITY): Payer: Medicare Other

## 2022-03-24 ENCOUNTER — Ambulatory Visit: Payer: Medicare Other

## 2022-03-24 DIAGNOSIS — J9601 Acute respiratory failure with hypoxia: Secondary | ICD-10-CM | POA: Diagnosis not present

## 2022-03-24 DIAGNOSIS — R5381 Other malaise: Secondary | ICD-10-CM | POA: Diagnosis not present

## 2022-03-24 DIAGNOSIS — E43 Unspecified severe protein-calorie malnutrition: Secondary | ICD-10-CM | POA: Diagnosis not present

## 2022-03-24 LAB — GLUCOSE, CAPILLARY
Glucose-Capillary: 112 mg/dL — ABNORMAL HIGH (ref 70–99)
Glucose-Capillary: 115 mg/dL — ABNORMAL HIGH (ref 70–99)
Glucose-Capillary: 116 mg/dL — ABNORMAL HIGH (ref 70–99)
Glucose-Capillary: 116 mg/dL — ABNORMAL HIGH (ref 70–99)
Glucose-Capillary: 120 mg/dL — ABNORMAL HIGH (ref 70–99)
Glucose-Capillary: 124 mg/dL — ABNORMAL HIGH (ref 70–99)
Glucose-Capillary: 153 mg/dL — ABNORMAL HIGH (ref 70–99)

## 2022-03-24 LAB — CBC
HCT: 30.2 % — ABNORMAL LOW (ref 39.0–52.0)
Hemoglobin: 9.5 g/dL — ABNORMAL LOW (ref 13.0–17.0)
MCH: 30.4 pg (ref 26.0–34.0)
MCHC: 31.5 g/dL (ref 30.0–36.0)
MCV: 96.5 fL (ref 80.0–100.0)
Platelets: 318 10*3/uL (ref 150–400)
RBC: 3.13 MIL/uL — ABNORMAL LOW (ref 4.22–5.81)
RDW: 17.4 % — ABNORMAL HIGH (ref 11.5–15.5)
WBC: 9.7 10*3/uL (ref 4.0–10.5)
nRBC: 0 % (ref 0.0–0.2)

## 2022-03-24 LAB — BASIC METABOLIC PANEL
Anion gap: 5 (ref 5–15)
BUN: 18 mg/dL (ref 8–23)
CO2: 33 mmol/L — ABNORMAL HIGH (ref 22–32)
Calcium: 8.6 mg/dL — ABNORMAL LOW (ref 8.9–10.3)
Chloride: 100 mmol/L (ref 98–111)
Creatinine, Ser: 0.32 mg/dL — ABNORMAL LOW (ref 0.61–1.24)
GFR, Estimated: >60 mL/min (ref >60)
Glucose, Bld: 135 mg/dL — ABNORMAL HIGH (ref 70–99)
Potassium: 3.3 mmol/L — ABNORMAL LOW (ref 3.5–5.1)
Sodium: 138 mmol/L (ref 135–145)

## 2022-03-24 MED ORDER — POTASSIUM CHLORIDE 10 MEQ/100ML IV SOLN
10.0000 meq | INTRAVENOUS | Status: DC
  Filled 2022-03-24: qty 100

## 2022-03-24 MED ORDER — POTASSIUM CHLORIDE 20 MEQ PO PACK
20.0000 meq | PACK | ORAL | Status: AC
  Administered 2022-03-24 (×2): 20 meq
  Filled 2022-03-24 (×2): qty 1

## 2022-03-24 MED ORDER — ALPRAZOLAM 1 MG PO TABS
1.0000 mg | ORAL_TABLET | Freq: Two times a day (BID) | ORAL | Status: DC
  Administered 2022-03-24 – 2022-03-29 (×10): 1 mg
  Filled 2022-03-24 (×10): qty 1

## 2022-03-24 MED ORDER — POTASSIUM CHLORIDE 10 MEQ/50ML IV SOLN
10.0000 meq | INTRAVENOUS | Status: AC
  Administered 2022-03-24 (×4): 10 meq via INTRAVENOUS
  Filled 2022-03-24 (×4): qty 50

## 2022-03-24 MED ORDER — FUROSEMIDE 10 MG/ML IJ SOLN
40.0000 mg | Freq: Four times a day (QID) | INTRAMUSCULAR | Status: AC
  Administered 2022-03-24 (×2): 40 mg via INTRAVENOUS
  Filled 2022-03-24 (×2): qty 4

## 2022-03-24 MED ORDER — ALPRAZOLAM 1 MG PO TABS: 1.0000 mg | ORAL_TABLET | Freq: Two times a day (BID) | ORAL | Status: DC

## 2022-03-24 MED ORDER — POTASSIUM CHLORIDE 20 MEQ PO PACK
40.0000 meq | PACK | Freq: Four times a day (QID) | ORAL | Status: AC
  Administered 2022-03-24 (×2): 40 meq
  Filled 2022-03-24 (×2): qty 2

## 2022-03-24 NOTE — Progress Notes (Signed)
Brief oncology note: ? ?Received message from PCCM that patient will be going to [facility] following hospital discharge.  Discussed with Dr. Julien Nordmann and will cancel current medical oncology appointments.  Scheduling message has been sent to arrange for outpatient follow-up with Dr. Julien Nordmann in approximately 3 to 4 weeks to reevaluate and determine if chemotherapy will be resumed at that point. ? ?I have requested for appointments to be scheduled so that they are on his AVS when he is discharged from the hospital. ? ?Mikey Bussing, DNP, AGPCNP-BC, AOCNP ? ?

## 2022-03-24 NOTE — Progress Notes (Signed)
eLink Physician-Brief Progress Note ?Patient Name: Mark Yu ?DOB: 06-Dec-1956 ?MRN: [id number] ? ? ?Date of Service ? 03/24/2022  ?HPI/Events of Note ? Patient with respiratory fatigue on pressure support ventilation.  ?eICU Interventions ? Patient will be rested over night on full ventilator support.  ? ? ? ?  ? ?Kerry Kass Juliann Olesky ?03/24/2022, 12:10 AM ?

## 2022-03-24 NOTE — Progress Notes (Signed)
PT request to stay on ventilator with full support (feeling anxious- RN aware). RT Hudson secured an order to be placed on FS last night. ?

## 2022-03-24 NOTE — Discharge Planning (Signed)
Oncology Discharge Planning Admission Note ? ?[place] at [facility] ?Address: [address], [place], [place] [address] ?Hours of Operation:  8am - 5pm, Monday - Friday  ?Clinic Contact Information:  [phone] ? ?Oncology Care Team: ?Medical Oncologist:  Dr. Julien Nordmann, [facility] ? ?Dr. Julien Nordmann & Myrtha Mantis - NP are aware of this hospital admission dated 03/09/22, and the cancer center will follow [facility] inpatient care to assist with discharge planning as indicated by the oncologist.  We will plan to schedule necessary follow up closer to discharge from [facility] per notes. ? ?Disclaimer:  This [place] note does not imply a formal consult request has been made by the admitting attending for this admission or there will be an inpatient consult completed by oncology.  Please request oncology consults as per standard process as indicated. ?

## 2022-03-24 NOTE — Progress Notes (Signed)
? ?NAME:  Shelley Pooley, MRN:  [id number], DOB:  06/09/1956, LOS: 26 ?ADMISSION DATE:  03/08/2022, CONSULTATION DATE:  3/14 ?REFERRING MD:  [place], CHIEF COMPLAINT:  Dyspnea  ? ?History of Present Illness:  ?66 y/o male presented with limited stage small cell lung cancer and COPD admitted with acute on chronic respiratory failure with hypoxemia due to rhinovirus requiring intubation on 3/14.   ? ?Pertinent  Medical History  ?Severe COPD with emphysema, Depression, Anxiety, HTN, extensive stage SCLC ?Concurrent systemic chemotherapy and radiation treatment ?Rhinovirus infection ? ?Significant Hospital Events: ?Including procedures, antibiotic start and stop dates in addition to other pertinent events   ?3/12 Admit with dyspnea, AECOPD in setting of rhinovirus  ?3/14 PCCM consulted for evaluation of respiratory failure. Intubated for airway protection  ?3/17 On vent, XRT  ?3/18 tolerating pressure support today ?3/19 not weaning as well today with every episode of bronchospasms ?3/20 PRBC for hgb of 6.6, PSV wean & extubated.  Failed due to global weakness/inability to clear secretions > reintubated.  ?Tracheal aspirate negative normal flora ?3/22 extubated. D/w family and pt trach vs one-way extubation. Pt was non-committal. Was on NIPPV for about 8 hrs then had to be re-intubated ?3/23 vanc and cefepime started. On-going family and pt discussion. He agreed to trach  ?Tracheal aspirate-no growth ?MRSA PCR negative ?03/20/22- TRACH - [name] and [name] ?03/21/22 -status post tracheostomy 40% FiO2 10 L oxygen.  Currently on trach collar.  Overnight was on ventilator.  Also on fentanyl infusion and Precedex infusion.  Nursing reports that he gets very anxious when trying to wean.  Nevertheless he sitting with tracheostomy and typing on the keyboard and asking appropriate questions.  On antibiotics cefepime.  On Jevity tube feeds.  Afebrile since 03/18/2022.  White count is coming down. ?3/27 refused XRT again  today ? ?Interim History / Subjective:  ? ?Refused XRT again  ?Says that he feels over stimulated: it's too loud, the tracheostomy collar bothers him; ?This morning he feels short of breath ? ? ?Objective   ?Blood pressure 128/70, pulse 100, temperature 98.3 ?F (36.8 ?C), temperature source Oral, resp. rate 17, height 5\' 11"  (1.803 m), weight 49.4 kg, SpO2 96 %. ?   ?Vent Mode: PRVC ?FiO2 (%):  [35 %] 35 % ?Set Rate:  [14 bmp] 14 bmp ?Vt Set:  [600 mL] 600 mL ?PEEP:  [5 cmH20] 5 cmH20 ?Pressure Support:  [5 cmH20-10 cmH20] 10 cmH20  ? ?Intake/Output Summary (Last 24 hours) at 03/24/2022 0808 ?Last data filed at 03/24/2022 0330 ?Gross per 24 hour  ?Intake 1249.94 ml  ?Output 3200 ml  ?Net -1950.06 ml  ? ?Filed Weights  ? 03/22/22 0500 03/23/22 0500 03/24/22 0424  ?Weight: 49.8 kg 49.8 kg 49.4 kg  ? ? ?Examination: ? ?General:  In bed on vent ?HENT: NCAT tracheostomy in place ?PULM: CTA B, vent supported breathing ?CV: RRR, no mgr ?GI: BS+, soft, nontender ?MSK: normal bulk and tone ?Neuro: tracheostomy in place ? ? ?Resolved Hospital Problem list   ? ? ?Assessment & Plan:  ?Principal Problem: ?  Acute respiratory failure with hypoxia (HCC) ?Active Problems: ?  COPD exacerbation ([place]) ?  Anxiety ?  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  Anemia associated with chemotherapy ?  Essential hypertension ?  Metastasis to lymph nodes ([place]) ?  Protein-calorie malnutrition, severe ?  Depression ?  Physical deconditioning ? 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 ?Aspiration pneumonia ?Stop cefepime after 5  days ? ?Limited stage small cell lung cancer ?Continue XRT  ?F/u heme/onc recommendations ? ?Depression and anxiety ?Xanax to continue ? ?Protein calorie malnutrition, severe ?Cachexia ?Tube feeding to continue ?Hoping he can swallow and we can avoid a PEG ?Vitamin supplementation to continue ? ?Anemia of chronic illness ?Monitor for bleeding ?Transfuse PRBC for Hgb < 7 gm/dL ? ?Hypertension ?Amlodipine ? ?Best Practice (right click and "Reselect all SmartList Selections" daily)  ? ?Diet/type: tubefeeds ?DVT prophylaxis: SCD > consider restart sub q heparin this week ?GI prophylaxis: PPI ?Lines: N/A ?Foley:  N/A ?Code Status:  full code ?Last date of multidisciplinary goals of care discussion [3/24 with [facility]; I updated his girlfriend bedside on 3/28] ? ?Labs   ?CBC: ?Recent Labs  ?Lab 03/21/22 ?8768 03/21/22 ?1157 03/22/22 ?2620 03/23/22 ?3559 03/24/22 ?7416  ?WBC 11.4* 11.6* 10.7* 7.2 9.7  ?HGB 6.0* 8.8* 9.3* 9.1* 9.5*  ?HCT 19.9* 27.6* 29.0* 28.0* 30.2*  ?MCV 100.5* 97.5 95.4 95.6 96.5  ?PLT 312 355 382 302 318  ? ? ?Basic Metabolic Panel: ?Recent Labs  ?Lab 03/18/22 ?2029 03/19/22 ?3845 03/20/22 ?3646 03/21/22 ?8032 03/22/22 ?0320 03/23/22 ?1224 03/24/22 ?8250  ?NA 142   < > 139 137 136 135 138  ?K 4.0   < > 4.1 3.9 3.7 3.8 3.3*  ?CL 104   < > 105 102 98 97* 100  ?CO2 30   < > 31 32 31 32 33*  ?GLUCOSE 129*   < > 101* 116* 117* 137* 135*  ?BUN 23   < > 26* 25* 22 20 18   ?CREATININE 0.38*   < > 0.32* 0.36* 0.32* 0.33* 0.32*  ?CALCIUM 8.9   < > 8.5* 8.1* 8.2* 8.3* 8.6*  ?MG 2.2  --   --   --  2.1 2.1  --   ?PHOS  --   --   --   --  3.3  --   --   ? < > = values in this interval not displayed.  ? ?GFR: ?Estimated Creatinine Clearance: 64.3 mL/min (A) (by C-G formula based on SCr of 0.32 mg/dL (L)). ?Recent Labs  ?Lab 03/18/22 ?0952 03/18/22 ?2035 03/19/22 ?0370 03/19/22 ?1044 03/20/22 ?0442 03/21/22 ?4888 03/21/22 ?9169 03/22/22 ?4503 03/23/22 ?8882 03/24/22 ?8003  ?PROCALCITON 0.11  --  0.31  --  0.25  --   --    --   --   --   ?WBC  --    < > 16.0*   < > 10.1   < > 11.6* 10.7* 7.2 9.7  ? < > = values in this interval not displayed.  ? ? ?Liver Function Tests: ?Recent Labs  ?Lab 03/21/22 ?4917 03/22/22 ?0320  ?AST 16 18  ?ALT 17 19  ?ALKPHOS 59 59  ?BILITOT 1.2 0.6  ?PROT 6.1* 6.2*  ?ALBUMIN 2.3* 2.5*  ? ?No results for input(s): LIPASE, AMYLASE in the last 168 hours. ?No results for input(s): AMMONIA in the last 168 hours. ? ?ABG ?   ?Component Value Date/Time  ? PHART 7.35 03/21/2022 0054  ? PCO2ART 57 (H) 03/21/2022 0054  ? PO2ART 56 (L) 03/21/2022 0054  ? HCO3 32.0 (H) 03/21/2022 0054  ? ACIDBASEDEF 4.6 (H) 03/09/2022  1836  ? O2SAT 87.8 03/21/2022 0054  ?  ? ?Coagulation Profile: ?Recent Labs  ?Lab 03/21/22 ?4098  ?INR 1.2  ? ? ?Cardiac Enzymes: ?No results for input(s): CKTOTAL, CKMB, CKMBINDEX, TROPONINI in the last 168 hours. ? ?HbA1C: ?No results found for: HGBA1C ? ?CBG: ?Recent Labs  ?Lab 03/23/22 ?1613 03/23/22 ?2043 03/24/22 ?1191 03/24/22 ?4782 03/24/22 ?9562  ?GLUCAP 123* 112* 116* 124* 120*  ? ? ?Critical care time: 30 minutes ?  ? ?Roselie Awkward, MD ?[facility] ?Pager: [phone] ?Cell: [phone] ?After 7:00 pm call Elink  [phone] ? ?

## 2022-03-24 NOTE — Progress Notes (Signed)
OT Cancellation Note ? ?Patient Details ?Name: Mark Yu ?MRN: [id number] ?DOB: 1956-10-10 ? ? ?Cancelled Treatment:    Reason Eval/Treat Not Completed: Medical issues which prohibited therapy. Spoke with RN this AM, patient asking to stay on vent due to anxiety vs switching to trach, asked to check back later. Will re-schedule OT eval attempt for 3/29.  ? ?Delbert Phenix OT ?OT pager: [phone] ? ? ?Rosemary Holms ?03/24/2022, 2:22 PM ?

## 2022-03-24 NOTE — Progress Notes (Signed)
SLP Cancellation Note ? ?Patient Details ?Name: Mark Yu ?MRN: [id number] ?DOB: [birth year] ? ? ?Cancelled treatment:       Reason Eval/Treat Not Completed: Patient declined. Explained benefit of PMV for verbal communication - its ease of use vs writing/letter boards. Pt declined.  He remains on full vent support this am secondary to anxiety per chart.  SLP will continue efforts to encourage PMV. ? ?Lylia Karn L. Alexandro Line, MA CCC/SLP ?Acute Rehabilitation Services ?Office number [phone] ?Pager [phone] ? ? ? ?Juan Quam Laurice ?03/24/2022, 11:27 AM ?

## 2022-03-24 NOTE — Progress Notes (Signed)
Per CCM order, trach stitches removed- uneventful. ?

## 2022-03-24 NOTE — Progress Notes (Signed)
K+3.3 ?Replaced per protocol  ?

## 2022-03-24 NOTE — Progress Notes (Signed)
? ?                                                                                                                                                     ?                                                   ?Daily Progress Note  ? ?Patient Name: Mark Yu       Date: 03/24/2022 ?DOB: 10/28/1956  Age: 66 y.o. MRN#: [id number] ?Attending Physician: Mark Doom, MD ?Primary Care Physician: Mark Peer, MD ?Admit Date: 03/08/2022 ? ?Reason for Consultation/Follow-up: Establishing goals of care ? ?Subjective: ?Awake alert oriented sitting up in bed.  He is able to type on a word document on the computer in the room. ? ? ?Length of Stay: 15 ? ?Current Medications: ?Scheduled Meds:  ? ALPRAZolam  1 mg Per Tube BID  ? amLODipine  2.5 mg Per Tube Daily  ? arformoterol  15 mcg Nebulization BID  ? budesonide (PULMICORT) nebulizer solution  0.5 mg Nebulization BID  ? chlorhexidine gluconate (MEDLINE KIT)  15 mL Mouth Rinse BID  ? Chlorhexidine Gluconate Cloth  6 each Topical Daily  ? cholecalciferol  1,000 Units Per Tube Daily  ? docusate  100 mg Per Tube BID  ? feeding supplement (PROSource TF)  45 mL Per Tube 5 X Daily  ? folic acid  1 mg Per Tube Daily  ? free water  100 mL Per Tube Q4H  ? furosemide  40 mg Intravenous Q6H  ? magnesium oxide  400 mg Per Tube Daily  ? mouth rinse  15 mL Mouth Rinse 10 times per day  ? nystatin   Topical BID  ? pantoprazole sodium  40 mg Per Tube Daily  ? polyethylene glycol  17 g Per Tube Daily  ? potassium chloride  40 mEq Per Tube Q6H  ? predniSONE  10 mg Per Tube Q breakfast  ? revefenacin  175 mcg Nebulization Daily  ? sodium chloride flush  10-40 mL Intracatheter Q12H  ? sodium chloride flush  3 mL Intravenous Q12H  ? vitamin B-12  1,000 mcg Per Tube Daily  ? zinc sulfate  220 mg Per Tube Daily  ? ? ?Continuous Infusions: ? sodium chloride 10 mL/hr at 03/24/22 1406  ? dexmedetomidine (PRECEDEX) IV infusion 0.3 mcg/kg/hr (03/24/22 1406)  ? feeding supplement (JEVITY 1.2 CAL)  1,000 mL (03/24/22 0408)  ? ? ?PRN Meds: ?sodium chloride, acetaminophen **OR** acetaminophen, albuterol, fentaNYL (SUBLIMAZE) injection, loperamide HCl, midazolam, sodium chloride flush ? ?Physical Exam         ?Thin appearing elderly gentleman sitting up in bed ?Has tracheostomy ?Has  Jevity tube feeds ?Awake alert oriented ? ?Vital Signs: BP (!) 151/76   Pulse 88   Temp 98.2 ?F (36.8 ?C) (Oral)   Resp (!) 23   Ht _0  (1.803 m)   Wt 49.4 kg   SpO2 100%   BMI 15.19 kg/m?  ?SpO2: SpO2: 100 % ?O2 Device: O2 Device: Ventilator ?O2 Flow Rate: O2 Flow Rate (L/min): 8 L/min ? ?Intake/output summary:  ?Intake/Output Summary (Last 24 hours) at 03/24/2022 1519 ?Last data filed at 03/24/2022 1406 ?Gross per 24 hour  ?Intake 2740.83 ml  ?Output 2887 ml  ?Net -146.17 ml  ? ?LBM: Last BM Date : 03/23/22 ?Baseline Weight: Weight: 49 kg ?Most recent weight: Weight: 49.4 kg ? ?     ?Palliative Assessment/Data: ? ? ? ?Flowsheet Rows   ? ?Flowsheet Row Most Recent Value  ?Intake Tab   ?Referral Department Critical care  ?Unit at Time of Referral ICU  ?Palliative Care Primary Diagnosis Pulmonary  ?Date Notified 03/18/22  ?Palliative Care Type New Palliative care  ?Reason for referral Clarify Goals of Care  ?Date of Admission 03/08/22  ?Date first seen by Palliative Care 03/19/22  ?# of days Palliative referral response time 1 Day(s)  ?# of days IP prior to Palliative referral 10  ?Clinical Assessment   ?Palliative Performance Scale Score 30%  ?Psychosocial & Spiritual Assessment   ?Palliative Care Outcomes   ?Patient/Family meeting held? Yes  ?Who was at the meeting? Patient  ? ?  ? ? ?Patient Active Problem List  ? Diagnosis Date Noted  ? Depression   ? Physical deconditioning   ? Protein-calorie malnutrition, severe 03/10/2022  ? 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 Essential hypertension 03/09/2022  ? Metastasis to lymph nodes ([place]) 03/09/2022  ? Acute respiratory failure with hypoxia ([place]) 03/08/2022  ?  Port-A-Cath in place 02/25/2022  ? 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 Encounter for antineoplastic chemotherapy 02/09/2022  ? Hilar adenopathy   ? COPD exacerbation ([place]) 01/22/2022  ? Tobacco use 01/22/2022  ? Anxiety 01/22/2022  ? Pulmonary nodules 01/08/2022  ? ? ?Palliative Care Assessment & Plan  ? ?Patient Profile: ? ? ? ?Assessment: ?66 year old gentleman with small cell lung cancer and COPD admitted with acute on chronic respiratory failure with hypoxemia due to rhinovirus.  Patient is status post ventilator dependent respiratory failure and had to undergo tracheostomy placement. ? ? ?Recommendations/Plan: ?Palliative medicine team has been consulted for goals of care decisions.  Patient is awake alert sitting up in bed.  He is able to type on a word document on the computer.  Patient has written that his pain is very sharp and that he is trying to be coherent.  He asks for patient's as he is not able to type very fast.  I asked him why he has been refusing to go for radiation treatments.  Patient states that he would like to rest and recover for 1 more day and believes that he will be in much better shape to go for radiation treatments tomorrow.  He wishes to continue with full code/full scope treatment.  He is aware that he will have to go to [facility] facility. ?Brief life review performed.  Patient states that [name] [phone] is his fianc?e.  He states that he has 3 sisters.  He states that he has a son who lives in [place].  He states that he has not completed advance care planning documents and has not designated  anybody to be his healthcare power of attorney agent.  For now, he wishes to make his own decisions and does not want to complete advance care planning documents. ? ? ?Goals of Care and Additional Recommendations: ?Limitations on Scope of Treatment: Full Scope Treatment ? ?Code Status: full code.  ? ?  ?Code Status Orders  ?(From admission,  onward)  ?  ? ? ?  ? ?  Start     Ordered  ? 03/08/22 1903  Full code  Continuous       ? 03/08/22 1903  ? ?  ?  ? ?  ? ?Code Status History   ? ? This patient has a current code status but no historical code status.  ? ?  ? ? ?Prognosis: ? Unable to determine ? ?Discharge Planning: ?LTACH ? ?Care plan was discussed with  patient. He is able to type up on the computer.  ? ?Thank you for allowing the Palliative Medicine Team to assist in the care of this patient. ? ? ?Time In: 1300 Time Out: 1325 Total Time 25 Prolonged Time Billed  no   ? ?   ?Greater than 50%  of this time was spent counseling and coordinating care related to the above assessment and plan. ? ?Loistine Chance, MD ? ?Please contact Palliative Medicine Team phone at [phone] for questions and concerns.  ? ? ? ? ? ?

## 2022-03-25 ENCOUNTER — Ambulatory Visit
Admission: RE | Admit: 2022-03-25 | Discharge: 2022-03-25 | Disposition: A | Discharge status: Home / Self Care | Payer: Medicare Other | Source: Ambulatory Visit | Attending: Radiation Oncology | Admitting: Radiation Oncology

## 2022-03-25 ENCOUNTER — Inpatient Hospital Stay: Payer: Medicare Other

## 2022-03-25 DIAGNOSIS — J9601 Acute respiratory failure with hypoxia: Secondary | ICD-10-CM | POA: Diagnosis not present

## 2022-03-25 LAB — BPAM RBC
Blood Product Expiration Date: 202304152359
Blood Product Expiration Date: 202304202359
ISSUE DATE / TIME: 202303250259
Unit Type and Rh: 5100
Unit Type and Rh: 5100

## 2022-03-25 LAB — GLUCOSE, CAPILLARY
Glucose-Capillary: 126 mg/dL — ABNORMAL HIGH (ref 70–99)
Glucose-Capillary: 119 mg/dL — ABNORMAL HIGH (ref 70–99)
Glucose-Capillary: 94 mg/dL (ref 70–99)
Glucose-Capillary: 87 mg/dL (ref 70–99)

## 2022-03-25 LAB — COMPREHENSIVE METABOLIC PANEL
ALT: 17 U/L (ref 0–44)
AST: 16 U/L (ref 15–41)
Albumin: 2.6 g/dL — ABNORMAL LOW (ref 3.5–5.0)
Alkaline Phosphatase: 61 U/L (ref 38–126)
Anion gap: 4 — ABNORMAL LOW (ref 5–15)
BUN: 24 mg/dL — ABNORMAL HIGH (ref 8–23)
CO2: 33 mmol/L — ABNORMAL HIGH (ref 22–32)
Calcium: 8.8 mg/dL — ABNORMAL LOW (ref 8.9–10.3)
Chloride: 99 mmol/L (ref 98–111)
Creatinine, Ser: 0.35 mg/dL — ABNORMAL LOW (ref 0.61–1.24)
GFR, Estimated: >60 mL/min (ref >60)
Glucose, Bld: 126 mg/dL — ABNORMAL HIGH (ref 70–99)
Potassium: 4.5 mmol/L (ref 3.5–5.1)
Sodium: 136 mmol/L (ref 135–145)
Total Bilirubin: 0.2 mg/dL — ABNORMAL LOW (ref 0.3–1.2)
Total Protein: 6.9 g/dL (ref 6.5–8.1)

## 2022-03-25 LAB — TYPE AND SCREEN
ABO/RH(D): O POS
Antibody Screen: NEGATIVE
Unit division: 0
Unit division: 0

## 2022-03-25 LAB — CBC WITH DIFFERENTIAL/PLATELET
Abs Immature Granulocytes: 0.06 10*3/uL (ref 0.00–0.07)
Basophils Absolute: 0 10*3/uL (ref 0.0–0.1)
Basophils Relative: 0 %
Eosinophils Absolute: 0 10*3/uL (ref 0.0–0.5)
Eosinophils Relative: 0 %
HCT: 29 % — ABNORMAL LOW (ref 39.0–52.0)
Hemoglobin: 9.2 g/dL — ABNORMAL LOW (ref 13.0–17.0)
Immature Granulocytes: 1 %
Lymphocytes Relative: 8 %
Lymphs Abs: 0.4 10*3/uL — ABNORMAL LOW (ref 0.7–4.0)
MCH: 31 pg (ref 26.0–34.0)
MCHC: 31.7 g/dL (ref 30.0–36.0)
MCV: 97.6 fL (ref 80.0–100.0)
Monocytes Absolute: 0.9 10*3/uL (ref 0.1–1.0)
Monocytes Relative: 16 %
Neutro Abs: 4.1 10*3/uL (ref 1.7–7.7)
Neutrophils Relative %: 75 %
Platelets: 324 10*3/uL (ref 150–400)
RBC: 2.97 MIL/uL — ABNORMAL LOW (ref 4.22–5.81)
RDW: 17.6 % — ABNORMAL HIGH (ref 11.5–15.5)
WBC: 5.5 10*3/uL (ref 4.0–10.5)
nRBC: 0 % (ref 0.0–0.2)

## 2022-03-25 MED ORDER — PROSOURCE TF PO LIQD
45.0000 mL | Freq: Four times a day (QID) | ORAL | Status: DC
  Administered 2022-03-25 – 2022-03-30 (×21): 45 mL
  Filled 2022-03-25 (×22): qty 45

## 2022-03-25 MED ORDER — FUROSEMIDE 10 MG/ML PO SOLN
40.0000 mg | Freq: Four times a day (QID) | ORAL | Status: AC
  Administered 2022-03-25 (×2): 40 mg
  Filled 2022-03-25 (×2): qty 4

## 2022-03-25 MED ORDER — JEVITY 1.2 CAL PO LIQD
1000.0000 mL | ORAL | Status: DC
  Administered 2022-03-27 – 2022-03-30 (×5): 1000 mL

## 2022-03-25 MED ORDER — POTASSIUM CHLORIDE 20 MEQ PO PACK
40.0000 meq | PACK | Freq: Two times a day (BID) | ORAL | Status: AC
  Administered 2022-03-25 (×2): 40 meq
  Filled 2022-03-25 (×2): qty 2

## 2022-03-25 NOTE — TOC Progression Note (Signed)
Transition of Care (TOC) - Progression Note  ? ? ?Patient Details  ?Name: Jeydan Barner ?MRN: [id number] ?Date of Birth: 06-29-1956 ? ?Transition of Care (TOC) CM/SW Contact  ?[place], LCSW ?Phone Number: ?03/25/2022, 9:55 AM ? ?Clinical Narrative:    ? ?LTACH auth still pending as of this morning per [facility] with [employer].  ? ?Expected Discharge Plan: Home/Self Care ?Barriers to Discharge: Insurance Authorization ? ?Expected Discharge Plan and Services ?Expected Discharge Plan: Home/Self Care ?  ?Discharge Planning Services: CM Consult ?  ?Living arrangements for the past 2 months: [place] ?                ?  ?  ?  ?  ?  ?  ?  ?  ?  ?  ? ? ?Social Determinants of Health (SDOH) Interventions ?  ? ?Readmission Risk Interventions ? ?  03/09/2022  ? 12:52 PM  ?Readmission Risk Prevention Plan  ?Transportation Screening Complete  ?PCP or Specialist Appt within 3-5 Days Complete  ?[place] or Home Care Consult Complete  ?Social Work Consult for [place] Planning/Counseling Complete  ?Medication Review Press photographer) Complete  ? ? ?

## 2022-03-25 NOTE — Progress Notes (Signed)
OT Cancellation Note ? ?Patient Details ?Name: Leam Madero ?MRN: [id number] ?DOB: Dec 04, 1956 ? ? ?Cancelled Treatment:    Reason Eval/Treat Not Completed: Other (comment) Spoke with RN this AM patient going for radiation. Second attempt patient fatigued from radiation. Will re-attempt 3/30. ? ?Delbert Phenix OT ?OT pager: [phone] ? ? ?Rosemary Holms ?03/25/2022, 12:25 PM ?

## 2022-03-25 NOTE — Progress Notes (Signed)
? ?NAME:  Mark Yu, MRN:  [id number], DOB:  August 08, 1956, LOS: 69 ?ADMISSION DATE:  03/08/2022, CONSULTATION DATE:  3/14 ?REFERRING MD:  [place], CHIEF COMPLAINT:  Dyspnea  ? ?History of Present Illness:  ?66 y/o male presented with limited stage small cell lung cancer and COPD admitted with acute on chronic respiratory failure with hypoxemia due to rhinovirus requiring intubation on 3/14.   ? ?Pertinent  Medical History  ?Severe COPD with emphysema, Depression, Anxiety, HTN, extensive stage SCLC ?Concurrent systemic chemotherapy and radiation treatment ?Rhinovirus infection ? ?Significant Hospital Events: ?Including procedures, antibiotic start and stop dates in addition to other pertinent events   ?3/12 Admit with dyspnea, AECOPD in setting of rhinovirus  ?3/14 PCCM consulted for evaluation of respiratory failure. Intubated for airway protection  ?3/17 On vent, XRT  ?3/18 tolerating pressure support today ?3/19 not weaning as well today with every episode of bronchospasms ?3/20 PRBC for hgb of 6.6, PSV wean & extubated.  Failed due to global weakness/inability to clear secretions > reintubated.  ?Tracheal aspirate negative normal flora ?3/22 extubated. D/w family and pt trach vs one-way extubation. Pt was non-committal. Was on NIPPV for about 8 hrs then had to be re-intubated ?3/23 vanc and cefepime started. On-going family and pt discussion. He agreed to trach  ?Tracheal aspirate-no growth ?MRSA PCR negative ?03/20/22- TRACH - [name] and [name] ?03/21/22 -status post tracheostomy 40% FiO2 10 L oxygen.  Currently on trach collar.  Overnight was on ventilator.  Also on fentanyl infusion and Precedex infusion.  Nursing reports that he gets very anxious when trying to wean.  Nevertheless he sitting with tracheostomy and typing on the keyboard and asking appropriate questions.  On antibiotics cefepime.  On Jevity tube feeds.  Afebrile since 03/18/2022.  White count is coming down. ?3/27 refused XRT again  today ?3/28 stopped cefepime ? ?Interim History / Subjective:  ? ?Resting comfortably this morning ?For XRT today at 10:45 ? ?Objective   ?Blood pressure 115/65, pulse 71, temperature 98.2 ?F (36.8 ?C), temperature source Oral, resp. rate 15, height 5\' 11"  (1.803 m), weight 49.4 kg, SpO2 99 %. ?   ?Vent Mode: PRVC ?FiO2 (%):  [35 %] 35 % ?Set Rate:  [14 bmp] 14 bmp ?Vt Set:  [600 mL] 600 mL ?PEEP:  [5 cmH20] 5 cmH20 ?Plateau Pressure:  [15 SEG31-51 cmH20] 15 cmH20  ? ?Intake/Output Summary (Last 24 hours) at 03/25/2022 0754 ?Last data filed at 03/24/2022 1900 ?Gross per 24 hour  ?Intake 860.9 ml  ?Output 2297 ml  ?Net -1436.1 ml  ? ?Filed Weights  ? 03/23/22 0500 03/24/22 0424 03/25/22 0500  ?Weight: 49.8 kg 49.4 kg 49.4 kg  ? ? ?Examination: ? ?General:  Cachectic male, in bed on vent ?HENT: NCAT tracheostomy in place ?PULM: CTA B, vent supported breathing ?CV: RRR, no mgr ?GI: BS+, soft, nontender ?MSK: normal bulk and tone ?Neuro: sleeping on vent ? ?3/28 CXR images personally reviewed> flattened diaphragms, no infiltrate, tracheostomy in place ? ?Resolved Hospital Problem list   ?Aspiration pneumonia ? ?Assessment & Plan:  ?Principal Problem: ?  Acute respiratory failure with hypoxia (HCC) ?Active Problems: ?  COPD exacerbation ([place]) ?  Anxiety ?  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  Anemia associated with chemotherapy ?  Essential hypertension ?  Metastasis to lymph nodes ([place]) ?  Protein-calorie malnutrition, severe ?  Depression ?  Physical deconditioning ? 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 ?Limited stage small  cell lung cancer ?Contiue XRT ?F/U heme/onc recommendations > resume outpatient visits and infusions in 3-4 weeks ? ?Depression and anxiety ?Xanax to continue ? ?Protein calorie malnutrition, severe ?Cachexia ?Tube feeding to continue ?SLP following> will continue to encourage him to swallow ? ?Anemia of chronic illness ?Monitor for bleeding ?Transfuse PRBC for Hgb < 7 gm/dL ? ?Hypertension ?Amlodipine ? ?Best Practice (right click and "Reselect all SmartList Selections" daily)  ? ?Diet/type: tubefeeds ?DVT prophylaxis: SCD > consider restart sub q heparin this week ?GI prophylaxis: PPI ?Lines: N/A ?Foley:  N/A ?Code Status:  full code ?Last date of multidisciplinary goals of care discussion [3/24 with [facility]; I updated his girlfriend bedside on 3/28] ? ?Labs   ?CBC: ?Recent Labs  ?Lab 03/21/22 ?5456 03/22/22 ?0328 03/23/22 ?0409 03/24/22 ?0443 03/25/22 ?0400  ?WBC 11.6* 10.7* 7.2 9.7 5.5  ?NEUTROABS  --   --   --   --  4.1  ?HGB 8.8* 9.3* 9.1* 9.5* 9.2*  ?HCT 27.6* 29.0* 28.0* 30.2* 29.0*  ?MCV 97.5 95.4 95.6 96.5 97.6  ?PLT 355 382 302 318 324  ? ? ?Basic Metabolic Panel: ?Recent Labs  ?Lab 03/18/22 ?2029 03/19/22 ?2563 03/21/22 ?8937 03/22/22 ?0320 03/23/22 ?3428 03/24/22 ?0443 03/25/22 ?0400  ?NA 142   < > 137 136 135 138 136  ?K 4.0   < > 3.9 3.7 3.8 3.3* 4.5  ?CL 104   < > 102 98 97* 100 99  ?CO2 30   < > 32 31 32 33* 33*  ?GLUCOSE 129*   < > 116* 117* 137* 135* 126*  ?BUN 23   < > 25* 22 20 18  24*  ?CREATININE 0.38*   < > 0.36* 0.32* 0.33* 0.32* 0.35*  ?CALCIUM 8.9   < > 8.1* 8.2* 8.3* 8.6* 8.8*  ?MG 2.2  --   --  2.1 2.1  --   --   ?PHOS  --   --   --  3.3  --   --   --   ? < > = values in this interval not displayed.  ? ?GFR: ?Estimated Creatinine Clearance: 64.3 mL/min (A) (by C-G formula based on SCr of 0.35 mg/dL (L)). ?Recent Labs  ?Lab 03/18/22 ?0952 03/18/22 ?2035 03/19/22 ?7681 03/19/22 ?1044 03/20/22 ?0442 03/21/22 ?1572 03/22/22 ?6203 03/23/22 ?0409 03/24/22 ?0443 03/25/22 ?0400  ?PROCALCITON  0.11  --  0.31  --  0.25  --   --   --   --   --   ?WBC  --    < > 16.0*   < > 10.1   < > 10.7* 7.2 9.7 5.5  ? < > = values in this interval not displayed.  ? ? ?Liver Function Tests: ?Recent Labs  ?Lab 03/21/22 ?0655 03/22/22 ?0320 03/25/22 ?0400  ?AST 16 18 16   ?ALT 17 19 17   ?ALKPHOS 59 59 61  ?BILITOT 1.2 0.6 0.2*  ?PROT 6.1* 6.2* 6.9  ?ALBUMIN 2.3* 2.5* 2.6*  ? ?No results for input(s): LIPASE, AMYLASE in the last 168 hours. ?No results for input(s): AMMONIA in the last 168 hours. ? ?ABG ?   ?Component Value Date/Time  ? PHART 7.35 03/21/2022 0054  ? PCO2ART 57 (H) 03/21/2022 0054  ? PO2ART 56 (  L) 03/21/2022 0054  ? HCO3 32.0 (H) 03/21/2022 0054  ? ACIDBASEDEF 4.6 (H) 03/09/2022 1836  ? O2SAT 87.8 03/21/2022 0054  ?  ? ?Coagulation Profile: ?Recent Labs  ?Lab 03/21/22 ?4199  ?INR 1.2  ? ? ?Cardiac Enzymes: ?No results for input(s): CKTOTAL, CKMB, CKMBINDEX, TROPONINI in the last 168 hours. ? ?HbA1C: ?No results found for: HGBA1C ? ?CBG: ?Recent Labs  ?Lab 03/24/22 ?1112 03/24/22 ?1643 03/24/22 ?2014 03/24/22 ?1444 03/25/22 ?5848  ?GLUCAP 153* 112* 115* 116* 126*  ? ? ?Critical care time: 31 minutes ?  ? ?Roselie Awkward, MD ?[place] [facility] ?Pager: [phone] ?Cell: [phone] ?After 7:00 pm call Elink  [phone] ? ?

## 2022-03-25 NOTE — Progress Notes (Signed)
Nutrition Follow-up ? ?DOCUMENTATION CODES:  ? ?Severe malnutrition in context of chronic illness, Underweight ? ?INTERVENTION:  ?- will adjust TF regimen: Jevity 1.2 @ 50 ml/hr with 45 ml Prosource TF QID and 100 ml free water. ? ?- this regimen will provide 1600 kcal (105% kcal need), 111 grams protein, and 1568 ml free water. ? ? ?NUTRITION DIAGNOSIS:  ? ?Severe Malnutrition related to chronic illness, cancer and cancer related treatments as evidenced by severe fat depletion, severe muscle depletion. -ongoing ? ?GOAL:  ? ?Patient will meet greater than or equal to 90% of their needs -met ? ?MONITOR:  ? ?Vent status, TF tolerance, Labs, Weight trends ? ?ASSESSMENT:  ? ?66 y.o. male with medical history of recently diagnosed small cell lung cancer, COPD, anxiety, HTN, and depression. He presented to the ED due to progressive shortness of breath with minimal exertion x2 days. he had been finished a steroid taper shortly before admission and had recently been prescribed an appetite stimulant. ? ?Significant Events: ?3/12- admission ?3/14- intubation; OGT placement; initial RD assessment; tube feeding initiation  ?3/20- extubation + re-intubation; OGT removal + replacement ?3/22- extubation + re-intubation; OGT removal + replacement ?3/24- tracheostomy + diagnostic bronch; OGT removed; small bore NGT placed in R nare (distal gastric per abdominal x-ray) ? ? ?Patient remains intubated with small bore NGT in place. He returned from XRT a few minutes prior to RD visit. No visitors present at the time of RD visit. ? ?He is receiving Jevity 1.2 @ 45 ml/hr with 45 ml Prosource TF x5/day and 100 ml free water every 4 hours. This regimen is providing 1496 kcal (98% kcal need), 115 grams protein, and 1471 ml free water.  ? ?Patient is currently intubated via trach on ventilator support ?MV: 9.8 L/min ?Temp (24hrs), Avg:98.5 ?F (36.9 ?C), Min:98.2 ?F (36.8 ?C), Max:98.6 ?F (37 ?C) ?Propofol: none ?BP: 93/58 and MAP: 67 ? ?Labs  reviewed; CBGs: 126 and 119 mg/dl, BUN: 24 mg/dl, creatinine: 0.35 mg/dl. ? ?Medications reviewed; 1000 units cholecalciferol/day, 100 mg colace BID, 1 mg folvite/day, 40 mg IV lasix x2 doses 3/28, PRN imodium, 400 mag-ox/day, 40 mg protonix per OGT/day, 17 g miralax/day, 40 mEq Klor-Con x2 doses 3/28, 10 mEq IV KCl x4 runs 3/28, 10 mg deltasone/day, 1000 mcg cyanocobalamin per OGT/day, 220 mg zinc sulfate/day. ? ?Drips; precedex @ 0.9 mcg/kg/hr. ? ? ?Diet Order:   ?Diet Order   ? ?       ?  Diet NPO time specified  Diet effective now       ?  ? ?  ?  ? ?  ? ? ?EDUCATION NEEDS:  ? ?No education needs have been identified at this time ? ?Skin:  Skin Assessment: Skin Integrity Issues: ?Skin Integrity Issues:: DTI ?DTI: vertebral column (newly documented on 3/19) ? ?Last BM:  3/28 (multiple medium to large type 7) ? ?Height:  ? ?Ht Readings from Last 1 Encounters:  ?03/18/22 5' 11"  (1.803 m)  ? ? ?Weight:  ? ?Wt Readings from Last 1 Encounters:  ?03/25/22 49.4 kg  ? ? ?BMI:  Body mass index is 15.19 kg/m?. ? ?Estimated Nutritional Needs:  ?Kcal:  1524 kcal ?Protein:  95-120 grams ?Fluid:  >/= 2 L/day ? ? ? ? ?Jarome Matin, MS, RD, LDN ?Registered Dietitian II ?Inpatient Clinical Nutrition ?RD pager # and on-call/weekend pager # available in [place]  ? ?

## 2022-03-26 ENCOUNTER — Ambulatory Visit
Admission: RE | Admit: 2022-03-26 | Discharge: 2022-03-26 | Disposition: A | Discharge status: Home / Self Care | Payer: Medicare Other | Source: Ambulatory Visit | Attending: Radiation Oncology | Admitting: Radiation Oncology

## 2022-03-26 DIAGNOSIS — F419 Anxiety disorder, unspecified: Secondary | ICD-10-CM | POA: Diagnosis not present

## 2022-03-26 DIAGNOSIS — J9601 Acute respiratory failure with hypoxia: Secondary | ICD-10-CM | POA: Diagnosis not present

## 2022-03-26 DIAGNOSIS — J441 Chronic obstructive pulmonary disease with (acute) exacerbation: Secondary | ICD-10-CM | POA: Diagnosis not present

## 2022-03-26 LAB — GLUCOSE, CAPILLARY
Glucose-Capillary: 102 mg/dL — ABNORMAL HIGH (ref 70–99)
Glucose-Capillary: 105 mg/dL — ABNORMAL HIGH (ref 70–99)
Glucose-Capillary: 108 mg/dL — ABNORMAL HIGH (ref 70–99)
Glucose-Capillary: 125 mg/dL — ABNORMAL HIGH (ref 70–99)
Glucose-Capillary: 95 mg/dL (ref 70–99)
Glucose-Capillary: 96 mg/dL (ref 70–99)
Glucose-Capillary: 99 mg/dL (ref 70–99)

## 2022-03-26 MED ORDER — PREDNISONE 20 MG PO TABS
20.0000 mg | ORAL_TABLET | Freq: Every day | ORAL | Status: AC
  Administered 2022-03-27 – 2022-03-28 (×2): 20 mg
  Filled 2022-03-26 (×2): qty 1

## 2022-03-26 MED ORDER — FUROSEMIDE 40 MG PO TABS
40.0000 mg | ORAL_TABLET | Freq: Two times a day (BID) | ORAL | Status: DC
  Administered 2022-03-26 – 2022-03-29 (×6): 40 mg
  Filled 2022-03-26 (×6): qty 1

## 2022-03-26 NOTE — Progress Notes (Signed)
? ?NAME:  Gurinder Toral, MRN:  [id number], DOB:  1956/02/25, LOS: 2 ?ADMISSION DATE:  03/08/2022, CONSULTATION DATE:  3/14 ?REFERRING MD:  Danford, CHIEF COMPLAINT:  Dyspnea  ? ?History of Present Illness:  ?66 y/o male presented with limited stage small cell lung cancer and COPD admitted with acute on chronic respiratory failure with hypoxemia due to rhinovirus requiring intubation on 3/14.   ? ?Pertinent  Medical History  ?Severe COPD with emphysema, Depression, Anxiety, HTN, extensive stage SCLC ?Concurrent systemic chemotherapy and radiation treatment ?Rhinovirus infection ? ?Significant Hospital Events: ?Including procedures, antibiotic start and stop dates in addition to other pertinent events   ?3/12 Admit with dyspnea, AECOPD in setting of rhinovirus  ?3/14 PCCM consulted for evaluation of respiratory failure. Intubated for airway protection  ?3/17 On vent, XRT  ?3/18 tolerating pressure support today ?3/19 not weaning as well today with every episode of bronchospasms ?3/20 PRBC for hgb of 6.6, PSV wean & extubated.  Failed due to global weakness/inability to clear secretions > reintubated.  ?Tracheal aspirate negative normal flora ?3/22 extubated. D/w family and pt trach vs one-way extubation. Pt was non-committal. Was on NIPPV for about 8 hrs then had to be re-intubated ?3/23 vanc and cefepime started. On-going family and pt discussion. He agreed to trach  ?Tracheal aspirate-no growth ?MRSA PCR negative ?03/20/22- TRACH - [name] and [name] ?03/21/22 -status post tracheostomy 40% FiO2 10 L oxygen.  Currently on trach collar.  Overnight was on ventilator.  Also on fentanyl infusion and Precedex infusion.  Nursing reports that he gets very anxious when trying to wean.  Nevertheless he sitting with tracheostomy and typing on the keyboard and asking appropriate questions.  On antibiotics cefepime.  On Jevity tube feeds.  Afebrile since 03/18/2022.  White count is coming down. ?3/27 refused XRT again  today ?3/28 stopped cefepime ? ?Interim History / Subjective:  ? ?Feels OK ?He's ready to do radiation therapy today ? ?Objective   ?Blood pressure 134/79, pulse (!) 101, temperature 98.3 ?F (36.8 ?C), temperature source Oral, resp. rate 20, height 5\' 11"  (1.803 m), weight 48.9 kg, SpO2 98 %. ?   ?Vent Mode: PRVC ?FiO2 (%):  [30 %-40 %] 35 % ?Set Rate:  [14 bmp] 14 bmp ?Vt Set:  [600 mL] 600 mL ?PEEP:  [5 cmH20] 5 cmH20 ?Plateau Pressure:  [10 cmH20-18 cmH20] 17 cmH20  ? ?Intake/Output Summary (Last 24 hours) at 03/26/2022 0908 ?Last data filed at 03/26/2022 0800 ?Gross per 24 hour  ?Intake 2979.4 ml  ?Output 2350 ml  ?Net 629.4 ml  ? ?Filed Weights  ? 03/24/22 0424 03/25/22 0500 03/26/22 0416  ?Weight: 49.4 kg 49.4 kg 48.9 kg  ? ? ?Examination: ? ?General:  In bed on vent ?HENT: NCAT tracheostomy in place ?PULM: wheezing bilaterally, vent supported breathing ?CV: RRR, no mgr ?GI: BS+, soft, nontender ?MSK: normal bulk and tone ?Neuro: awake, alert, conversant ? ? ?3/28 CXR images personally reviewed> flattened diaphragms, no infiltrate, tracheostomy in place ? ?Resolved Hospital Problem list   ?Aspiration pneumonia ? ?Assessment & Plan:  ?Principal Problem: ?  Acute respiratory failure with hypoxia (HCC) ?Active Problems: ?  COPD exacerbation ([place]) ?  Anxiety ?  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 ([place]) ?  Anemia associated with chemotherapy ?  Essential hypertension ?  Metastasis to lymph nodes ([place]) ?  Protein-calorie malnutrition, severe ?  Depression ?  Physical deconditioning ? ?Acute on chronic hypoxemic/hypercarbic respiratory failure from rhinovirus pneumonia with COPD exacerbation >  wheezing is worse on 3/30 ?Cigarette smoker ?Baseline severe COPD with extensive centrilobular and bullous emphysema  ?Tracheostomy status with some bleeding now improved ?Continue to pursue LTACH ?Trach collar during day ?Ventilator at night ?Increase prednisone to 20mg  daily  ?Continue  brovana/pulmicort/yupelri ?Continue lasix ? ?Limited stage small cell lung cancer ?Continue XRT ?F/U heme/onc recommendations> resume outpatient visits and infusions in 3-4 weeks ? ?Depression and anxiety ?Xanax as ordered ?Wean off precedex ? ?Protein calorie malnutrition, severe ?Cachexia ?Tube feeding to continue  ?SLP following > I have encouraged him to try the PMV and swallowing ? ?Anemia of chronic illness ?Monitor for bleeding ?Transfuse PRBC for Hgb < 7 gm/dL ? ?Hypertension ?Amlodipine ? ?Best Practice (right click and "Reselect all SmartList Selections" daily)  ? ?Diet/type: tubefeeds ?DVT prophylaxis: SCD > consider restart sub q heparin this week ?GI prophylaxis: PPI ?Lines: N/A ?Foley:  N/A ?Code Status:  full code ?Last date of multidisciplinary goals of care discussion [3/24 with [facility]; I spoke to him at length on 3/30, his goals have not changed and he wants to continue with his XRT and pursue LTACH] ? ?Labs   ?CBC: ?Recent Labs  ?Lab 03/21/22 ?0272 03/22/22 ?0328 03/23/22 ?0409 03/24/22 ?0443 03/25/22 ?0400  ?WBC 11.6* 10.7* 7.2 9.7 5.5  ?NEUTROABS  --   --   --   --  4.1  ?HGB 8.8* 9.3* 9.1* 9.5* 9.2*  ?HCT 27.6* 29.0* 28.0* 30.2* 29.0*  ?MCV 97.5 95.4 95.6 96.5 97.6  ?PLT 355 382 302 318 324  ? ? ?Basic Metabolic Panel: ?Recent Labs  ?Lab 03/21/22 ?5366 03/22/22 ?0320 03/23/22 ?0409 03/24/22 ?0443 03/25/22 ?0400  ?NA 137 136 135 138 136  ?K 3.9 3.7 3.8 3.3* 4.5  ?CL 102 98 97* 100 99  ?CO2 32 31 32 33* 33*  ?GLUCOSE 116* 117* 137* 135* 126*  ?BUN 25* 22 20 18  24*  ?CREATININE 0.36* 0.32* 0.33* 0.32* 0.35*  ?CALCIUM 8.1* 8.2* 8.3* 8.6* 8.8*  ?MG  --  2.1 2.1  --   --   ?PHOS  --  3.3  --   --   --   ? ?GFR: ?Estimated Creatinine Clearance: 63.7 mL/min (A) (by C-G formula based on SCr of 0.35 mg/dL (L)). ?Recent Labs  ?Lab 03/20/22 ?0442 03/21/22 ?4403 03/22/22 ?4742 03/23/22 ?0409 03/24/22 ?0443 03/25/22 ?0400  ?PROCALCITON 0.25  --   --   --   --   --   ?WBC 10.1   < > 10.7* 7.2 9.7 5.5  ? <  > = values in this interval not displayed.  ? ? ?Liver Function Tests: ?Recent Labs  ?Lab 03/21/22 ?0655 03/22/22 ?0320 03/25/22 ?0400  ?AST 16 18 16   ?ALT 17 19 17   ?ALKPHOS 59 56 38  ?BILITOT 1.2 0.6 0.2*  ?PROT 6.1* 6.2* 6.9  ?ALBUMIN 2.3* 2.5* 2.6*  ? ?No results for input(s): LIPASE, AMYLASE in the last 168 hours. ?No results for input(s): AMMONIA in the last 168 hours. ? ?ABG ?   ?Component Value Date/Time  ? PHART 7.35 03/21/2022 0054  ? PCO2ART 57 (H) 03/21/2022 0054  ? PO2ART 56 (L) 03/21/2022 0054  ? HCO3 32.0 (H) 03/21/2022 0054  ? ACIDBASEDEF 4.6 (H) 03/09/2022 1836  ? O2SAT 87.8 03/21/2022 0054  ?  ? ?Coagulation Profile: ?Recent Labs  ?Lab 03/21/22 ?7564  ?INR 1.2  ? ? ?Cardiac Enzymes: ?No results for input(s): CKTOTAL, CKMB, CKMBINDEX, TROPONINI in the last 168 hours. ? ?HbA1C: ?No results found for: HGBA1C ? ?CBG: ?  Recent Labs  ?Lab 03/25/22 ?1626 03/25/22 ?2001 03/26/22 ?0013 03/26/22 ?0037 03/26/22 ?0730  ?GLUCAP 94 87 95 108* 99  ? ? ?Critical care time: 32 minutes ?  ? ?Roselie Awkward, MD ?[facility] ?Pager: [phone] ?Cell: [phone] ?After 7:00 pm call Elink  [phone] ? ?

## 2022-03-26 NOTE — Progress Notes (Signed)
Physical Therapy Treatment ?Patient Details ?Name: Mark Yu ?MRN: [id number] ?DOB: 07-08-1956 ?Today's Date: 03/26/2022 ? ? ?History of Present Illness 66 yo male smoker presented to [facility] with dyspnea.   Found to have rhinovirus, pna, COPD exacerbation. had progressive hypoxia and transferred to SDU 3/13 and started on Bipap.  Respiratory status and mental status worsened,  intubated on 3/14, Ext 3/22 and re-intubated, trach 3/24.  PMH: COPD with emphysema and recent diagnosis of extensive stage SCLC on concurrent chemoradiation therapy. ? ?  ?PT Comments  ? ? Pt agreeable to LE therex and performed same with assist and rest breaks.  Pt declines to attempt sitting EOB 2* fatigue and states "Its been a big day and I still have to go for radiation".   ?Recommendations for follow up therapy are one component of a multi-disciplinary discharge planning process, led by the attending physician.  Recommendations may be updated based on patient status, additional functional criteria and insurance authorization. ? ?Follow Up Recommendations ? Skilled nursing-short term rehab (<3 hours/day) ?  ?  ?Assistance Recommended at Discharge Frequent or constant Supervision/Assistance  ?Patient can return home with the following A lot of help with bathing/dressing/bathroom;Two people to help with bathing/dressing/bathroom;Help with stairs or ramp for entrance;Direct supervision/assist for medications management;Assistance with feeding;Assist for transportation;Assistance with cooking/housework ?  ?Equipment Recommendations ? Other (comment)  ?  ?Recommendations for Other Services   ? ? ?  ?Precautions / Restrictions Precautions ?Precautions: Fall ?Precaution Comments: T-collar, panda, multiple lines and leads/monitor VS ?Restrictions ?Weight Bearing Restrictions: No  ?  ? ?Mobility ? Bed Mobility ?  ?  ?  ?  ?  ?  ?  ?General bed mobility comments: Deferred, patient declines trying to sit up edge of bed ?  ? ?Transfers ?  ?  ?  ?  ?   ?  ?  ?  ?  ?  ?  ? ?Ambulation/Gait ?  ?  ?  ?  ?  ?  ?  ?  ? ? ?Stairs ?  ?  ?  ?  ?  ? ? ?Wheelchair Mobility ?  ? ?Modified Rankin (Stroke Patients Only) ?  ? ? ?  ?Balance   ?  ?  ?  ?  ?  ?  ?  ?  ?  ?  ?  ?  ?  ?  ?  ?  ?  ?  ?  ? ?  ?Cognition Arousal/Alertness: Awake/alert ?Behavior During Therapy: [facility] for tasks assessed/performed ?Overall Cognitive Status: Within Functional Limits for tasks assessed ?  ?  ?  ?  ?  ?  ?  ?  ?  ?  ?  ?  ?  ?  ?  ?  ?General Comments: appears WFL per computer communication and alphabet board ?  ?  ? ?  ?Exercises General Exercises - Lower Extremity ?Ankle Circles/Pumps: AROM, Both, 10 reps, Supine, AAROM ?Heel Slides: AAROM, Both, 15 reps, Supine ?Hip ABduction/ADduction: AAROM, Both, 10 reps, Supine ? ?  ?General Comments   ?  ?  ? ?Pertinent Vitals/Pain Pain Assessment ?Pain Assessment: No/denies pain  ? ? ?Home Living Family/patient expects to be discharged to::  [facility]) ?Living Arrangements: Spouse/significant other ?Available Help at Discharge: Family ?  ?  ?  ?  ?  ?  ?  ?   ?  ?Prior Function    ?  ?  ?   ? ?PT Goals (current goals can now be found in the care  plan section) Acute Rehab PT Goals ?Patient Stated Goal: get stronger ?PT Goal Formulation: With patient ?Time For Goal Achievement: 04/06/22 ?Potential to Achieve Goals: Fair ?Progress towards PT goals: Progressing toward goals ? ?  ?Frequency ? ? ? Min 2X/week ? ? ? ?  ?PT Plan Current plan remains appropriate  ? ? ?Co-evaluation   ?  ?  ?  ?  ? ?  ?AM-PAC PT "6 Clicks" Mobility   ?Outcome Measure ? Help needed turning from your back to your side while in a flat bed without using bedrails?: Total ?Help needed moving from lying on your back to sitting on the side of a flat bed without using bedrails?: Total ?Help needed moving to and from a bed to a chair (including a wheelchair)?: Total ?Help needed standing up from a chair using your arms (e.g., wheelchair or bedside chair)?: Total ?Help needed to walk in  hospital room?: Total ?Help needed climbing 3-5 steps with a railing? : Total ?6 Click Score: 6 ? ?  ?End of Session   ?Activity Tolerance: Patient limited by fatigue;Other (comment) ?Patient left: in bed;with nursing/sitter in room ?Nurse Communication: Mobility status ?PT Visit Diagnosis: Other abnormalities of gait and mobility (R26.89);Difficulty in walking, not elsewhere classified (R26.2);Muscle weakness (generalized) (M62.81) ?  ? ? ?Time: 9563-8756 ?PT Time Calculation (min) (ACUTE ONLY): 30 min ? ?Charges:  $Therapeutic Exercise: 8-22 mins          ?          ? ?Debe Coder PT ?Acute Rehabilitation Services ?Pager [phone] ?Office [phone] ? ? ? ?Riyana Biel ?03/26/2022, 2:59 PM ? ?

## 2022-03-26 NOTE — Progress Notes (Signed)
Occupational Therapy Evaluation ? ?Patient lives with fiancee and is independent at baseline. Works as Art gallery manager. Patient with global weakness, limited activity tolerance and declines attempt to sit up to edge of bed today. Patient participate in exercises listed below and fatigues easily. Will continue to follow to progress with strength, endurance in order to maximize patient safety and independence with self care/mobility.  ? ? ? 03/26/22 1407  ?OT Visit Information  ?Assistance Needed +2 ?(for mobility)  ?History of Present Illness 66 yo male smoker presented to [facility] with dyspnea.   Found to have rhinovirus, pna, COPD exacerbation. had progressive hypoxia and transferred to SDU 3/13 and started on Bipap.  Respiratory status and mental status worsened,  intubated on 3/14, Ext 3/22 and re-intubated, trach 3/24.  PMH: COPD with emphysema and recent diagnosis of extensive stage SCLC on concurrent chemoradiation therapy.  ?Precautions  ?Precautions Fall  ?Precaution Comments T-collar, panda, multiple lines and leads/monitor VS  ?Restrictions  ?Weight Bearing Restrictions No  ?Home Living  ?Family/patient expects to be discharged to:  ?(LTACH)  ?Living Arrangements Spouse/significant other  ?Available Help at Discharge Family  ?Prior Function  ?Prior Level of Function  Independent/Modified Independent  ?Mobility Comments per pt and his girlfriend of 17 yrs, pt was independent prior to this admssion  ?ADLs Comments patient worked as radio DJ  ?Communication  ?Communication Tracheostomy  ?Pain Assessment  ?Pain Assessment No/denies pain  ?Cognition  ?Arousal/Alertness Awake/alert  ?Behavior During Therapy [facility] for tasks assessed/performed  ?Overall Cognitive Status Within Functional Limits for tasks assessed  ?Upper Extremity Assessment  ?Upper Extremity Assessment RUE deficits/detail;LUE deficits/detail  ?RUE Deficits / Details limited bilateral should AROM ~100 degrees flexion. Elbow strength 3/5  ?Lower Extremity  Assessment  ?Lower Extremity Assessment Defer to PT evaluation  ?ADL  ?Overall ADL's  Needs assistance/impaired  ?Eating/Feeding NPO  ?Grooming Set up;Bed level  ?Upper Body Bathing Minimal assistance;Bed level  ?Lower Body Bathing Maximal assistance;Bed level  ?Upper Body Dressing  Moderate assistance;Bed level  ?Lower Body Dressing Total assistance;Bed level  ?Toilet Transfer Details (indicate cue type and reason) Deferred patient declines trying to sit up at edge of bed  ?Toileting- Clothing Manipulation and Hygiene Total assistance;Bed level  ?General ADL Comments Patient presents with global weakness, decreased activity tolerance impacting overall independence with self care  ?Bed Mobility  ?General bed mobility comments Deferred, patient declines trying to sit up edge of bed  ?Exercises  ?Exercises Other exercises  ?Other Exercises  ?Other Exercises "punch the ceiling" x20 both arms, isometric strengthening of triceps 5 reps each  ?OT - End of Session  ?Equipment Utilized During Treatment Oxygen  ?Activity Tolerance Patient limited by fatigue  ?Patient left in bed;with call bell/phone within reach  ?OT Assessment  ?OT Recommendation/Assessment Patient needs continued OT Services  ?OT Visit Diagnosis Muscle weakness (generalized) (M62.81);Other abnormalities of gait and mobility (R26.89)  ?OT Problem List Decreased strength;Decreased activity tolerance;Impaired balance (sitting and/or standing);Cardiopulmonary status limiting activity  ?OT Plan  ?OT Frequency (ACUTE ONLY) Min 2X/week  ?OT Treatment/Interventions (ACUTE ONLY) Self-care/ADL training;Therapeutic exercise;Therapeutic activities;Patient/family education;Balance training  ?AM-PAC OT "6 Clicks" Daily Activity Outcome Measure (Version 2)  ?Help from another person eating meals? 1  ?Help from another person taking care of personal grooming? 3  ?Help from another person toileting, which includes using toliet, bedpan, or urinal? 1  ?Help from another  person bathing (including washing, rinsing, drying)? 2  ?Help from another person to put on and taking off regular upper body clothing?  2  ?Help from another person to put on and taking off regular lower body clothing? 1  ?6 Click Score 10  ?Progressive Mobility  ?What is the highest level of mobility based on the progressive mobility assessment? Level 1 (Bedfast) - Unable to balance while sitting on edge of bed  ?Activity Refused mobility  ?OT Recommendation  ?Follow Up Recommendations OT at Long-term acute care hospital  ?Assistance recommended at discharge Frequent or constant Supervision/Assistance  ?Patient can return home with the following Two people to help with walking and/or transfers;Two people to help with bathing/dressing/bathroom;Assistance with cooking/housework;Direct supervision/assist for medications management;Direct supervision/assist for financial management;Assist for transportation;Help with stairs or ramp for entrance  ?Functional Status Assessent Patient has had a recent decline in their functional status and/or demonstrates limited ability to make significant improvements in function in a reasonable and predictable amount of time  ?OT Equipment Other (comment) ?(TBD)  ?Individuals Consulted  ?Consulted and Agree with Results and Recommendations Patient  ?Acute Rehab OT Goals  ?Patient Stated Goal "I am very weak"  ?OT Goal Formulation With patient  ?Time For Goal Achievement 04/09/22  ?Potential to [place]  ?OT Time Calculation  ?OT Start Time (ACUTE ONLY) 1250  ?OT Stop Time (ACUTE ONLY) 1315  ?OT Time Calculation (min) 25 min  ?OT General Charges  ?$OT Visit 1 Visit  ?OT Evaluation  ?$OT Eval Low Complexity 1 Low  ?Written Expression  ?Dominant Hand Right  ? ?Mark Yu OT ?OT pager: [phone] ? ?

## 2022-03-26 NOTE — Progress Notes (Signed)
Chaplain engaged in an initial visit with Gorge and his fiance.  Kinta expressed that he has been feeling "so-so" while acknowledging that everybody keeps telling him he is doing well. Chaplain affirmed his healthcare journey and how hard that can be to experience.  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   ? ?Chaplain also was intentional about offering support to Tymar's fiance at his bedside and he had other medical professionals coming.  She shared that they have been together about 17 years, meeting through radio, and were recently supposed to get married and then learned about the state of Gurdeep's health.  They were set to get married in late Feb.  She voiced that it has been hard on the both of them. She noted that Bellamy's cancer has been shrinking and that he continues to be himself, full of personality, through it all.  ? ?Chaplain offered a compassionate presence, reflective listening and support. Chaplain will continue to follow-up. ? ? ? 03/26/22 1200  ?Clinical Encounter Type  ?Visited With Patient and family together  ?Visit Type Initial;Spiritual support  ? ? ?

## 2022-03-26 NOTE — Evaluation (Signed)
Passy-Muir Speaking Valve - Evaluation ?Patient Details  ?Name: Mark Yu ?MRN: [id number] ?Date of Birth: February 14, 1956 ? ?Today's Date: 03/26/2022 ?Time: 4193-7902 ?SLP Time Calculation (min) (ACUTE ONLY): 40 min ? ?Past Medical History:  ?Past Medical History:  ?Diagnosis Date  ? Anxiety   ? tx xanax  ? COPD (chronic obstructive pulmonary disease) ([place])   ? tx inhalers  ? Depression   ? Dyspnea   ? with exertion  ? HTN (hypertension)   ? tx amlodipine  ? Hyponatremia 03/08/2022  ? ?Past Surgical History:  ?Past Surgical History:  ?Procedure Laterality Date  ? BRONCHIAL BIOPSY  02/02/2022  ? Procedure: BRONCHIAL BIOPSIES;  Surgeon: Collene Gobble, MD;  Location: [facility] ENDOSCOPY;  Service: Pulmonary;;  ? BRONCHIAL BRUSHINGS  02/02/2022  ? Procedure: BRONCHIAL BRUSHINGS;  Surgeon: Collene Gobble, MD;  Location: [facility] ENDOSCOPY;  Service: Pulmonary;;  ? BRONCHIAL NEEDLE ASPIRATION BIOPSY  02/02/2022  ? Procedure: BRONCHIAL NEEDLE ASPIRATION BIOPSIES;  Surgeon: Collene Gobble, MD;  Location: [facility] ENDOSCOPY;  Service: Pulmonary;;  ? IR IMAGING GUIDED PORT INSERTION  02/24/2022  ? MRI    ? NO PAST SURGERIES    ? VIDEO BRONCHOSCOPY WITH ENDOBRONCHIAL ULTRASOUND N/A 02/02/2022  ? Procedure: VIDEO BRONCHOSCOPY WITH ENDOBRONCHIAL ULTRASOUND;  Surgeon: Collene Gobble, MD;  Location: [facility] ENDOSCOPY;  Service: Pulmonary;  Laterality: N/A;  ? VIDEO BRONCHOSCOPY WITH RADIAL ENDOBRONCHIAL ULTRASOUND  02/02/2022  ? Procedure: VIDEO BRONCHOSCOPY WITH RADIAL ENDOBRONCHIAL ULTRASOUND;  Surgeon: Collene Gobble, MD;  Location: [facility] ENDOSCOPY;  Service: Pulmonary;;  ? ?HPI:  ?with medical history of recently diagnosed small cell lung cancer, COPD, anxiety, HTN, and depression. He presented to the ED due to progressive shortness of breath with minimal exertion x2 days. he had been finished a steroid taper shortly before admission and had recently been prescribed an appetite stimulant.     Significant Events:  3/12- admission  3/14- intubation; OGT  placement; initial RD assessment; tube feeding initiation   3/20- extubation + re-intubation; OGT removal + replacement  3/22- extubation + re-intubation; OGT removal + replacement  3/24- tracheostomy + diagnostic bronch; OGT removed; small bore NGT placed in R nare (distal gastric per abdominal x-ray) per CCM notes.  ? ? ?Assessment / Plan / Recommendation ? ?Clinical Impression ? RT deflated pt's cuff prior to PMSV placement.  Adequate upper airway flow noted with occlusion of trach.  Pt with excellent tolerance of PMSV over approximately 25 minutes before becoming signficantly fatigued likely due to increased incidence and amount of coughing/expectoration of secretions as trial progressed.  Pt able to expectorate secretions orally and via trach fortunately but this requirement fatigued him significantly prompting end of PMSV trial.  PT with abdominal breathing and obvious fatigue.   Wet voice initially noted that would intermittently clear with cued throat clear/cough.  However wet voice would recur due to secretion retention.  His vitals remained stable throughout the session - with excellent coordination of speaking/respiration.  At this time, recommend use of PMSV with RNs, RT, or SLPs only due to increased production of secretions and fatigue.  SLP educated pt to use of valve, other physiological benefits and contraindictions.  Pt's s/o returned demonstration of PMSV removal and advised comfort with the process.  Using teach back, education ongoing.  Will follow for PMSV treatment - including increasing length of trials and for swallow evaluation when MD indicates ready. ?SLP Visit Diagnosis: Aphonia (R49.1)  ?  ?SLP Assessment ? Patient needs continued Speech Lanaguage Pathology Services  ?  ?  Recommendations for follow up therapy are one component of a multi-disciplinary discharge planning process, led by the attending physician.  Recommendations may be updated based on patient status, additional functional  criteria and insurance authorization. ? ?Follow Up Recommendations ? SLP at Long-term acute care hospital ? ?  ?Assistance Recommended at Discharge Frequent or constant Supervision/Assistance  ?Functional Status Assessment Patient has had a recent decline in their functional status and demonstrates the ability to make significant improvements in function in a reasonable and predictable amount of time.  ?Frequency and Duration min 2x/week  ?2 weeks ?  ? ?PMSV Trial Able to redirect subglottic air through upper airway: Yes ?Able to Attain Phonation: Yes ?Voice Quality: Wet;Low vocal intensity ?Able to Expectorate Secretions: Yes ?Level of Secretion Expectoration with PMSV: Tracheal;Oral ?Breath Support for Phonation: Moderately decreased ?Intelligibility: Intelligible ?Respirations During Trial: 17 (17-29, 17-18 most of the time) ?SpO2 During Trial: 90 % (91-92) ?Pulse During Trial: 85 ?Behavior: Alert;Expresses self well;Good eye contact;Other (comment);Responsive to questions (uses computer to type for communication without valve in place, language skills appear intact) ?  ?Tracheostomy Tube ? Additional Tracheostomy Tube Assessment ?Fenestrated: No ?Trach Collar Period: during entire session ?Secretion Description: copius, mildly viscous and white - pt able to expel them via trach and orally ?Frequency of Tracheal Suctioning: pt't trach was suctioned ?Level of Secretion Expectoration: Tracheal;Oral  ?  ?Vent Dependency ? FiO2 (%): 35 %  ?  ?Cuff Deflation Trial Tolerated Cuff Deflation: Yes ?Length of Time for Cuff Deflation Trial: entire session ?Behavior: Alert;Cooperative;Controlled  ?Kathleen Lime, MS CCC SLP ?Acute Rehab Services ?Office [phone] ?Pager [phone] ?   ? ? ?  ?[name] ?03/26/2022, 12:26 PM ? ?

## 2022-03-27 ENCOUNTER — Inpatient Hospital Stay (HOSPITAL_COMMUNITY): Payer: Medicare Other

## 2022-03-27 ENCOUNTER — Ambulatory Visit
Admission: RE | Admit: 2022-03-27 | Discharge: 2022-03-27 | Disposition: A | Discharge status: Home / Self Care | Payer: Medicare Other | Source: Ambulatory Visit | Attending: Radiation Oncology | Admitting: Radiation Oncology

## 2022-03-27 ENCOUNTER — Encounter: Payer: Self-pay | Admitting: *Deleted

## 2022-03-27 DIAGNOSIS — J9601 Acute respiratory failure with hypoxia: Secondary | ICD-10-CM | POA: Diagnosis not present

## 2022-03-27 DIAGNOSIS — J441 Chronic obstructive pulmonary disease with (acute) exacerbation: Secondary | ICD-10-CM | POA: Diagnosis not present

## 2022-03-27 DIAGNOSIS — C3412 Malignant neoplasm of upper lobe, left bronchus or lung: Secondary | ICD-10-CM | POA: Diagnosis not present

## 2022-03-27 LAB — GLUCOSE, CAPILLARY
Glucose-Capillary: 112 mg/dL — ABNORMAL HIGH (ref 70–99)
Glucose-Capillary: 141 mg/dL — ABNORMAL HIGH (ref 70–99)
Glucose-Capillary: 140 mg/dL — ABNORMAL HIGH (ref 70–99)
Glucose-Capillary: 121 mg/dL — ABNORMAL HIGH (ref 70–99)
Glucose-Capillary: 123 mg/dL — ABNORMAL HIGH (ref 70–99)

## 2022-03-27 LAB — COMPREHENSIVE METABOLIC PANEL
ALT: 20 U/L (ref 0–44)
AST: 17 U/L (ref 15–41)
Albumin: 2.9 g/dL — ABNORMAL LOW (ref 3.5–5.0)
Alkaline Phosphatase: 69 U/L (ref 38–126)
Anion gap: 6 (ref 5–15)
BUN: 23 mg/dL (ref 8–23)
CO2: 32 mmol/L (ref 22–32)
Calcium: 8.9 mg/dL (ref 8.9–10.3)
Chloride: 97 mmol/L — ABNORMAL LOW (ref 98–111)
Creatinine, Ser: 0.41 mg/dL — ABNORMAL LOW (ref 0.61–1.24)
GFR, Estimated: >60 mL/min (ref >60)
Glucose, Bld: 136 mg/dL — ABNORMAL HIGH (ref 70–99)
Potassium: 3.6 mmol/L (ref 3.5–5.1)
Sodium: 135 mmol/L (ref 135–145)
Total Bilirubin: 0.2 mg/dL — ABNORMAL LOW (ref 0.3–1.2)
Total Protein: 7.4 g/dL (ref 6.5–8.1)

## 2022-03-27 MED ORDER — POTASSIUM CHLORIDE 20 MEQ PO PACK
20.0000 meq | PACK | Freq: Once | ORAL | Status: AC
  Administered 2022-03-27: 20 meq
  Filled 2022-03-27: qty 1

## 2022-03-27 NOTE — Progress Notes (Signed)
Patient having desaturation issues during the night. Increased oxygen to 60% ATC and Sp02 still remained 87-89%. Placed patient back on the vent and will continue with wean in the morning.  ?

## 2022-03-27 NOTE — Progress Notes (Signed)
Patient requested coffee be pushed down his NGT, education was provided regarding his diet order.  ? ?Will continue to monitor.  ?

## 2022-03-27 NOTE — Progress Notes (Signed)
? ?NAME:  Mark Yu, MRN:  [id number], DOB:  Jul 03, 1956, LOS: 68 ?ADMISSION DATE:  03/08/2022, CONSULTATION DATE:  3/14 ?REFERRING MD:  Mark Yu, CHIEF COMPLAINT:  Dyspnea  ? ?History of Present Illness:  ?66 y/o male presented with limited stage small cell lung cancer and COPD admitted with acute on chronic respiratory failure with hypoxemia due to rhinovirus requiring intubation on 3/14.   ? ?Pertinent  Medical History  ?Severe COPD with emphysema, Depression, Anxiety, HTN, extensive stage SCLC ?Concurrent systemic chemotherapy and radiation treatment ?Rhinovirus infection ? ?Significant Hospital Events: ?Including procedures, antibiotic start and stop dates in addition to other pertinent events   ?3/12 Admit with dyspnea, AECOPD in setting of rhinovirus  ?3/14 PCCM consulted for evaluation of respiratory failure. Intubated for airway protection  ?3/17 On vent, XRT  ?3/18 tolerating pressure support today ?3/19 not weaning as well today with every episode of bronchospasms ?3/20 PRBC for hgb of 6.6, PSV wean & extubated.  Failed due to global weakness/inability to clear secretions > reintubated.  ?Tracheal aspirate negative normal flora ?3/22 extubated. D/w family and pt trach vs one-way extubation. Pt was non-committal. Was on NIPPV for about 8 hrs then had to be re-intubated ?3/23 vanc and cefepime started. On-going family and pt discussion. He agreed to trach  ?Tracheal aspirate-no growth ?MRSA PCR negative ?03/20/22- TRACH - Mark Yu and Mark Yu ?03/21/22 -status post tracheostomy 40% FiO2 10 L oxygen.  Currently on trach collar.  Overnight was on ventilator.  Also on fentanyl infusion and Precedex infusion.  Nursing reports that he gets very anxious when trying to wean.  Nevertheless he sitting with tracheostomy and typing on the keyboard and asking appropriate questions.  On antibiotics cefepime.  On Jevity tube feeds.  Afebrile since 03/18/2022.  White count is coming down. ?3/27 refused XRT again  today ?3/28 stopped cefepime ? ?Interim History / Subjective:  ? ?Required increased oxygen overnight and placed back on vent ?Back on trach collar this morning, copious secretions. ?Able to talk with PMV valve ?Afebrile ? ?Objective   ?Blood pressure (!) 154/87, pulse (!) 112, temperature 97.6 ?F (36.4 ?C), temperature source Oral, resp. rate 17, height 5\' 11"  (1.803 m), weight 48.9 kg, SpO2 99 %. ?   ?Vent Mode: PRVC ?FiO2 (%):  [25 %-40 %] 40 % ?Set Rate:  [14 bmp] 14 bmp ?Vt Set:  [600 mL] 600 mL ?PEEP:  [5 cmH20] 5 cmH20 ?Plateau Pressure:  [18 cmH20] 18 cmH20  ? ?Intake/Output Summary (Last 24 hours) at 03/27/2022 1041 ?Last data filed at 03/27/2022 3762 ?Gross per 24 hour  ?Intake 837.66 ml  ?Output 2000 ml  ?Net -1162.34 ml  ? ? ?Filed Weights  ? 03/25/22 0500 03/26/22 0416 03/27/22 0500  ?Weight: 49.4 kg 48.9 kg 48.9 kg  ? ? ?Examination: ? ?General: Chronically ill-appearing, emaciated, sitting up in bed, on trach collar ?HENT: NCAT tracheostomy in place ?PULM: No accessory muscle use, scattered bilateral rhonchi ?CV: RRR, no mgr ?GI: BS+, soft, nontender ?MSK: normal bulk and tone ?Neuro: Awake, interactive, nonfocal ? ?Labs show mild hypokalemia ? ?3/30 chest x-ray independently reviewed shows stable right lower lobe airspace disease ? ?Resolved Hospital Problem list   ?Aspiration pneumonia ? ?Assessment & Plan:  ?Principal Problem: ?  Acute respiratory failure with hypoxia (HCC) ?Active Problems: ?  COPD exacerbation ([place]) ?  Anxiety ?  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  Anemia associated with chemotherapy ?  Essential hypertension ?  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  Protein-calorie malnutrition, severe ?  Depression ?  Physical deconditioning ? ?Acute on chronic hypoxemic/hypercarbic respiratory failure from rhinovirus pneumonia with COPD exacerbation  ?Cigarette smoker ?Baseline severe COPD with extensive centrilobular and bullous emphysema  ?Tracheostomy status with some  bleeding now improved ? ?Continue tracheobronchial toilet due to copious secretions ?Continue trach collar daytime and vent at night until secretions decrease ?-prednisone to 20mg  daily  ?Continue brovana/pulmicort/yupelri ?Continue lasix ? ?Limited stage small cell lung cancer ?Continue XRT ?F/U heme/onc recommendations> resume outpatient visits and infusions in 3-4 weeks ? ?Depression and anxiety ?Xanax  ? ? ?Protein calorie malnutrition, severe ?Continue tube feeds ?Okay to attempt swallowing eval once secretions decreased ? ?Anemia of chronic illness ?Monitor for bleeding ?Transfuse PRBC for Hgb < 7 gm/dL ? ?Hypertension ?Amlodipine ? ?Replete hypokalemia ? ?Plan is to pursue LTAC discharge otherwise wean him off vent and plan for SNF ? ?Best Practice (right click and "Reselect all SmartList Selections" daily)  ? ?Diet/type: tubefeeds ?DVT prophylaxis: SCD > consider restart sub q heparin this week ?GI prophylaxis: PPI ?Lines: N/A ?Foley:  N/A ?Code Status:  full code ?Last date of multidisciplinary goals of care discussion [3/24 with Mark Yu;  he wants to continue with his XRT and pursue LTACH] ? ?Labs   ?CBC: ?Recent Labs  ?Lab 03/21/22 ?5397 03/22/22 ?0328 03/23/22 ?0409 03/24/22 ?0443 03/25/22 ?0400  ?WBC 11.6* 10.7* 7.2 9.7 5.5  ?NEUTROABS  --   --   --   --  4.1  ?HGB 8.8* 9.3* 9.1* 9.5* 9.2*  ?HCT 27.6* 29.0* 28.0* 30.2* 29.0*  ?MCV 97.5 95.4 95.6 96.5 97.6  ?PLT 355 382 302 318 324  ? ? ? ?Basic Metabolic Panel: ?Recent Labs  ?Lab 03/22/22 ?0320 03/23/22 ?0409 03/24/22 ?0443 03/25/22 ?0400 03/27/22 ?0500  ?NA 136 135 138 136 135  ?K 3.7 3.8 3.3* 4.5 3.6  ?CL 98 97* 100 99 97*  ?CO2 31 32 33* 33* 32  ?GLUCOSE 117* 137* 135* 126* 136*  ?BUN 22 20 18  24* 23  ?CREATININE 0.32* 0.33* 0.32* 0.35* 0.41*  ?CALCIUM 8.2* 8.3* 8.6* 8.8* 8.9  ?MG 2.1 2.1  --   --   --   ?PHOS 3.3  --   --   --   --   ? ? ?GFR: ?Estimated Creatinine Clearance: 63.7 mL/min (A) (by C-G formula based on SCr of 0.41 mg/dL (L)). ?Recent  Labs  ?Lab 03/22/22 ?0328 03/23/22 ?0409 03/24/22 ?0443 03/25/22 ?0400  ?WBC 10.7* 7.2 9.7 5.5  ? ? ? ?Liver Function Tests: ?Recent Labs  ?Lab 03/21/22 ?6734 03/22/22 ?0320 03/25/22 ?0400 03/27/22 ?0500  ?AST 16 18 16 17   ?ALT 17 19 17 20   ?ALKPHOS 59 59 61 69  ?BILITOT 1.2 0.6 0.2* 0.2*  ?PROT 6.1* 6.2* 6.9 7.4  ?ALBUMIN 2.3* 2.5* 2.6* 2.9*  ? ? ?No results for input(s): LIPASE, AMYLASE in the last 168 hours. ?No results for input(s): AMMONIA in the last 168 hours. ? ?ABG ?   ?Component Value Date/Time  ? PHART 7.35 03/21/2022 0054  ? PCO2ART 57 (H) 03/21/2022 0054  ? PO2ART 56 (L) 03/21/2022 0054  ? HCO3 32.0 (H) 03/21/2022 0054  ? ACIDBASEDEF 4.6 (H) 03/09/2022 1836  ? O2SAT 87.8 03/21/2022 0054  ? ?  ? ?Coagulation Profile: ?Recent Labs  ?Lab 03/21/22 ?1937  ?INR 1.2  ? ? ? ?Cardiac Enzymes: ?No results for input(s): CKTOTAL, CKMB, CKMBINDEX, TROPONINI in the last 168 hours. ? ?HbA1C: ?No results found for: HGBA1C ? ?CBG: ?  Recent Labs  ?Lab 03/26/22 ?1548 03/26/22 ?1932 03/26/22 ?2322 03/27/22 ?6619 03/27/22 ?0744  ?GLUCAP 96 105* 102* 112* 141*  ? ? ? ?Critical care time: 31 minutes ?  ? ?Kara Mead MD. Shade Flood. ?[facility] ?Pager : [phone] ? ?If no response to pager , please call [phone] until 7 pm ?After 7:00 pm call Elink  [phone]  ? ?03/27/2022 ? ? ?

## 2022-03-27 NOTE — Progress Notes (Signed)
RT was called to bedside, patient's sats were in the low 80's momentarily. PassyMuir valve removed per RT instruction, patient repositioned and sats began to increase.  ? ?Patient denies any pain, reports some anxiety.  ? ?VSS, will continue to monitor.  ?

## 2022-03-27 NOTE — Progress Notes (Signed)
Modified Barium Swallow Progress Note ? ?Patient Details  ?Name: Mark Yu ?MRN: [id number] ?Date of Birth: 1956/03/03 ? ?Today's Date: 03/27/2022 ? ?Modified Barium Swallow completed.  Full report located under Chart Review in the Imaging Section. ? ?Brief recommendations include the following: ? ?Clinical Impression ? Pt tested both with and without PSMV without significant change.  Patient presents with gross pharyngeal-cervical esophageal dysphagia with very minimal pharyngeal clearance due to severe hypomotilty.  Pt able to move very minimal amount of barium into esophagus which results in aspiration both during and after the swallow.  Decreased laryngeal elevation/closure allows aspiration/penetration during swallow and aspiration after as pyriform sinus retention spills into open airway.  Reflexive cough present with gross aspiration- but pt was unable to clear as aspirates were deep into trachea.  Various postures including chin tuck, head turn right with and without chin tuck and HOB reclined to approximately 45* did not improve airway protection nor decrease retention.  Forunately pt is able to cough and expectorate during entire testing procedure helping to expel portion of retained boluses.  Recommend NPO except SINGLE SMALL ice chips after oral care provided by RN ONLY.  Given pt's prolonged hospital coarse, severity of dysphagia, anticipate he will benefit from pursuing long term alternative means of nutrition if full treatment continues to be goal.  Pt will benefit from aggressive SLP dysphagia treatment and will require repeat instrumental evalaution before transitioning into any po beyond small single ice chips.  SLP educated pt to findings using monitor for live feedback.   Head turn right conducted due to pt having slight palatal deviation to the left upon phonation. ?  ?Swallow Evaluation Recommendations ? ?   ? ? SLP Diet Recommendations: Ice chips PRN after oral care ? Single ice chips given  by RN only  ?   ? ? Medication Administration: Via alternative means ? ?   ? ?   ? ?   ? ? Oral Care Recommendations: Oral care QID ? ?   ? ?Kathleen Lime, MS CCC SLP ?Acute Rehab Services ?Office [phone] ?Pager [phone] ? ? ?[name] ?03/27/2022,3:43 PM ?

## 2022-03-27 NOTE — Progress Notes (Signed)
Oncology Nurse Navigator Documentation ? ? ?  03/27/2022  ? 11:00 AM 03/23/2022  ?  4:00 PM 02/09/2022  ?  4:00 PM 02/04/2022  ?  9:00 AM 01/14/2022  ?  8:00 AM 01/13/2022  ? 12:00 PM 01/09/2022  ? 10:00 AM  ?Oncology Nurse Navigator Flowsheets  ?Confirmed Diagnosis Date    02/02/2022     ?Diagnosis Status   Confirmed Diagnosis Complete Pathology Pending     ?Planned Course of Treatment  Chemo/Radiation Concurrent       ?Phase of Treatment  Radiation       ?Chemotherapy Actual Start Date:  02/18/2022       ?Radiation Actual Start Date:  02/17/2022       ?Navigator Follow Up Date: 04/02/2022 03/26/2022 02/12/2022 02/09/2022 01/23/2022 01/16/2022 01/12/2022  ?Navigator Follow Up Reason: Review Note Appointment Review Appointment Review Follow-up After Biopsy Radiology Appointment Review Appointment Review  ?Navigator Location [facility] [facility] [facility] [facility]  ?Navigator Encounter Type Appt/Treatment Plan Review/I followed up on Mark Yu's notes while he is in the hospital. He is in the ICU getting care.  He has a follow up with Dr. Julien Nordmann in 2 weeks.  Will follow up on his status.  Appt/Treatment Plan Review Clinic/MDC Telephone Other: Other: Other:  ?Telephone    Outgoing Call     ?Multidisiplinary Clinic Date       01/08/2022  ?Multidisiplinary Clinic Type       Thoracic  ?Patient Visit Type   MedOnc Other   Other  ?Treatment Phase  Treatment Pre-Tx/Tx Discussion Pre-Tx/Tx Discussion Abnormal Scans Abnormal Scans Abnormal Scans  ?Barriers/Navigation Needs Coordination of Care Coordination of Care Education Coordination of Care;Education Coordination of Care Coordination of Care Coordination of Care  ?Education   Newly Diagnosed Cancer Education;Other Other     ?Interventions Coordination of Care Coordination of Care Coordination of Care;Psycho-Social Support Coordination of Care;Education;Psycho-Social Support Coordination of Care Coordination of  Care Coordination of Care  ?Acuity Level 2-Minimal Needs (1-2 Barriers Identified) Level 2-Minimal Needs (1-2 Barriers Identified) Level 3-Moderate Needs (3-4 Barriers Identified) Level 2-Minimal Needs (1-2 Barriers Identified) Level 2-Minimal Needs (1-2 Barriers Identified) Level 2-Minimal Needs (1-2 Barriers Identified) Level 2-Minimal Needs (1-2 Barriers Identified)  ?Coordination of Care Appts   Appts Other Other Other  ?Education Method   Verbal;Other Verbal     ?Time Spent with Patient 30 30 30 30 15 30 15   ?  ?

## 2022-03-28 ENCOUNTER — Other Ambulatory Visit: Payer: Self-pay

## 2022-03-28 ENCOUNTER — Inpatient Hospital Stay (HOSPITAL_COMMUNITY): Payer: Medicare Other

## 2022-03-28 DIAGNOSIS — C3412 Malignant neoplasm of upper lobe, left bronchus or lung: Secondary | ICD-10-CM | POA: Diagnosis not present

## 2022-03-28 DIAGNOSIS — E43 Unspecified severe protein-calorie malnutrition: Secondary | ICD-10-CM | POA: Diagnosis not present

## 2022-03-28 DIAGNOSIS — J9601 Acute respiratory failure with hypoxia: Secondary | ICD-10-CM | POA: Diagnosis not present

## 2022-03-28 DIAGNOSIS — J441 Chronic obstructive pulmonary disease with (acute) exacerbation: Secondary | ICD-10-CM | POA: Diagnosis not present

## 2022-03-28 LAB — BASIC METABOLIC PANEL
Anion gap: 7 (ref 5–15)
BUN: 28 mg/dL — ABNORMAL HIGH (ref 8–23)
CO2: 33 mmol/L — ABNORMAL HIGH (ref 22–32)
Calcium: 9.1 mg/dL (ref 8.9–10.3)
Chloride: 97 mmol/L — ABNORMAL LOW (ref 98–111)
Creatinine, Ser: 0.42 mg/dL — ABNORMAL LOW (ref 0.61–1.24)
GFR, Estimated: >60 mL/min (ref >60)
Glucose, Bld: 132 mg/dL — ABNORMAL HIGH (ref 70–99)
Potassium: 3.7 mmol/L (ref 3.5–5.1)
Sodium: 137 mmol/L (ref 135–145)

## 2022-03-28 LAB — GLUCOSE, CAPILLARY
Glucose-Capillary: 125 mg/dL — ABNORMAL HIGH (ref 70–99)
Glucose-Capillary: 134 mg/dL — ABNORMAL HIGH (ref 70–99)
Glucose-Capillary: 140 mg/dL — ABNORMAL HIGH (ref 70–99)
Glucose-Capillary: 139 mg/dL — ABNORMAL HIGH (ref 70–99)
Glucose-Capillary: 117 mg/dL — ABNORMAL HIGH (ref 70–99)
Glucose-Capillary: 112 mg/dL — ABNORMAL HIGH (ref 70–99)
Glucose-Capillary: 117 mg/dL — ABNORMAL HIGH (ref 70–99)

## 2022-03-28 LAB — CBC
HCT: 32 % — ABNORMAL LOW (ref 39.0–52.0)
Hemoglobin: 10.1 g/dL — ABNORMAL LOW (ref 13.0–17.0)
MCH: 30.4 pg (ref 26.0–34.0)
MCHC: 31.6 g/dL (ref 30.0–36.0)
MCV: 96.4 fL (ref 80.0–100.0)
Platelets: 264 10*3/uL (ref 150–400)
RBC: 3.32 MIL/uL — ABNORMAL LOW (ref 4.22–5.81)
RDW: 17.4 % — ABNORMAL HIGH (ref 11.5–15.5)
WBC: 7.7 10*3/uL (ref 4.0–10.5)
nRBC: 0 % (ref 0.0–0.2)

## 2022-03-28 MED ORDER — METOPROLOL TARTRATE 5 MG/5ML IV SOLN: 5.0000 mg | Freq: Once | INTRAVENOUS | Status: DC

## 2022-03-28 MED ORDER — AMIODARONE IV BOLUS ONLY 150 MG/100ML: 150.0000 mg | Freq: Once | INTRAVENOUS | Status: AC

## 2022-03-28 MED ORDER — AMIODARONE HCL IN DEXTROSE 360-4.14 MG/200ML-% IV SOLN
30.0000 mg/h | INTRAVENOUS | Status: DC
  Administered 2022-03-28 – 2022-03-29 (×3): 30 mg/h via INTRAVENOUS
  Filled 2022-03-28 (×3): qty 200

## 2022-03-28 MED ORDER — AMIODARONE HCL IN DEXTROSE 360-4.14 MG/200ML-% IV SOLN
60.0000 mg/h | INTRAVENOUS | Status: AC
  Administered 2022-03-28: 60 mg/h via INTRAVENOUS

## 2022-03-28 MED ORDER — AMIODARONE IV BOLUS ONLY 150 MG/100ML
INTRAVENOUS | Status: AC
  Administered 2022-03-28: 150 mg via INTRAVENOUS
  Filled 2022-03-28: qty 100

## 2022-03-28 MED ORDER — AMIODARONE HCL IN DEXTROSE 360-4.14 MG/200ML-% IV SOLN
INTRAVENOUS | Status: AC
  Filled 2022-03-28: qty 200

## 2022-03-28 MED ORDER — ENOXAPARIN SODIUM 30 MG/0.3ML IJ SOSY
30.0000 mg | PREFILLED_SYRINGE | Freq: Every day | INTRAMUSCULAR | Status: DC
  Administered 2022-03-28 – 2022-04-09 (×13): 30 mg via SUBCUTANEOUS
  Filled 2022-03-28 (×13): qty 0.3

## 2022-03-28 MED ORDER — ADENOSINE 6 MG/2ML IV SOLN
INTRAVENOUS | Status: AC
  Filled 2022-03-28: qty 4

## 2022-03-28 NOTE — Progress Notes (Signed)
?  Amiodarone Drug - Drug Interaction Consult Note ? ?Recommendations: ?Monitor and supplement as needed to maintain potassium >4 and magnesium >2 to minimize risk of cardiotoxicity ? ?Amiodarone is metabolized by the cytochrome P450 system and therefore has the potential to cause many drug interactions. Amiodarone has an average plasma half-life of 50 days (range 20 to 100 days).  ? ?There is potential for drug interactions to occur several weeks or months after stopping treatment and the onset of drug interactions may be slow after initiating amiodarone.  ? ?[]  Statins: Increased risk of myopathy. Simvastatin- restrict dose to 20mg  daily. ?Other statins: counsel patients to report any muscle pain or weakness immediately. ? ?[]  Anticoagulants: Amiodarone can increase anticoagulant effect. Consider warfarin dose reduction. Patients should be monitored closely and the dose of anticoagulant altered accordingly, remembering that amiodarone levels take several weeks to stabilize. ? ?[]  Antiepileptics: Amiodarone can increase plasma concentration of phenytoin, the dose should be reduced. Note that small changes in phenytoin dose can result in large changes in levels. Monitor patient and counsel on signs of toxicity. ? ?[]  Beta blockers: increased risk of bradycardia, AV block and myocardial depression. Sotalol - avoid concomitant use. ? ?[]   Calcium channel blockers (diltiazem and verapamil): increased risk of bradycardia, AV block and myocardial depression. ? ?[]   Cyclosporine: Amiodarone increases levels of cyclosporine. Reduced dose of cyclosporine is recommended. ? ?[]  Digoxin dose should be halved when amiodarone is started. ? ?[x]  Diuretics: increased risk of cardiotoxicity if hypokalemia occurs. ? ?[]  Oral hypoglycemic agents (glyburide, glipizide, glimepiride): increased risk of hypoglycemia. Patient's glucose levels should be monitored closely when initiating amiodarone therapy.  ? ?[]  Drugs that prolong the QT  interval:  Torsades de pointes risk may be increased with concurrent use - avoid if possible.  Monitor QTc, also keep magnesium/potassium WNL if concurrent therapy can't be avoided. ? Antibiotics: e.g. fluoroquinolones, erythromycin. ? Antiarrhythmics: e.g. quinidine, procainamide, disopyramide, sotalol. ? Antipsychotics: e.g. phenothiazines, haloperidol. ?  Lithium, tricyclic antidepressants, and methadone. ?Thank You,  ?Netta Cedars PharmD ?03/28/2022 1:35 AM ? ? ?  ?

## 2022-03-28 NOTE — Progress Notes (Signed)
Placed new ATC set up on PT. PT denies need for trach suctioning at this time. ?

## 2022-03-28 NOTE — Progress Notes (Signed)
eLink Physician-Brief Progress Note ?Patient Name: Shawan Corella ?DOB: [birth year] ?MRN: [id number] ? ? ?Date of Service ? 03/28/2022  ?HPI/Events of Note ? Notified of SVT with rates in the 160s-180s.  EKG and telemetry shows rapid atrial fibrillation.  ?BP 141/115. Pt is off anticoagulation due to bleeding around the trache.  ? ?RN also reported that breath sounds are very distant.  ?eICU Interventions ? Give amiodarone 150mg  IV bolus and start on amiodarone gtt.  ?Get CXR.   ? ? ? ?Intervention Category ?Intermediate Interventions: Arrhythmia - evaluation and management ? ?Elsie Lincoln ?03/28/2022, 1:32 AM ?

## 2022-03-28 NOTE — Progress Notes (Signed)
? ?NAME:  Mark Yu, MRN:  [id number], DOB:  Jan 18, 1956, LOS: 6 ?ADMISSION DATE:  03/08/2022, CONSULTATION DATE:  3/14 ?REFERRING MD:  Danford, CHIEF COMPLAINT:  Dyspnea  ? ?History of Present Illness:  ?66 y/o male presented with limited stage small cell lung cancer and COPD admitted with acute on chronic respiratory failure with hypoxemia due to rhinovirus requiring intubation on 3/14.   ? ?Pertinent  Medical History  ?Severe COPD with emphysema, Depression, Anxiety, HTN, extensive stage SCLC ?Concurrent systemic chemotherapy and radiation treatment ?Rhinovirus infection ? ?Significant Hospital Events: ?Including procedures, antibiotic start and stop dates in addition to other pertinent events   ?3/12 Admit with dyspnea, AECOPD in setting of rhinovirus  ?3/14 PCCM consulted for evaluation of respiratory failure. Intubated for airway protection  ?3/17 On vent, XRT  ?3/18 tolerating pressure support today ?3/19 not weaning as well today with every episode of bronchospasms ?3/20 PRBC for hgb of 6.6, PSV wean & extubated.  Failed due to global weakness/inability to clear secretions > reintubated.  ?Tracheal aspirate negative normal flora ?3/22 extubated. D/w family and pt trach vs one-way extubation. Pt was non-committal. Was on NIPPV for about 8 hrs then had to be re-intubated ?3/23 vanc and cefepime started. On-going family and pt discussion. He agreed to trach  ?Tracheal aspirate-no growth ?MRSA PCR negative ?03/20/22- TRACH - [name] and [name] ?03/21/22 -status post tracheostomy 40% FiO2 10 L oxygen.  Currently on trach collar.  Overnight was on ventilator.  Also on fentanyl infusion and Precedex infusion.  Nursing reports that he gets very anxious when trying to wean.  Nevertheless he sitting with tracheostomy and typing on the keyboard and asking appropriate questions.  On antibiotics cefepime.  On Jevity tube feeds.  Afebrile since 03/18/2022.  White count is coming down. ?3/27 refused XRT again  today ?3/28 stopped cefepime ?3/30 ATC daytime, Required increased oxygen overnight and placed back on vent ? ?Interim History / Subjective:  ? ?Placed back on vent overnight. ?Episode of AF/RVR, started back on amiodarone drip ?Able to communicate by writing ? ?Objective   ?Blood pressure (!) 151/98, pulse 88, temperature (!) 97.3 ?F (36.3 ?C), temperature source Oral, resp. rate 19, height 5\' 11"  (1.803 m), weight 50 kg, SpO2 96 %. ?   ?Vent Mode: PRVC ?FiO2 (%):  [30 %-35 %] 35 % ?Set Rate:  [14 bmp] 14 bmp ?Vt Set:  [600 mL] 600 mL ?PEEP:  [5 cmH20] 5 cmH20 ?Plateau Pressure:  [11 cmH20-16 cmH20] 16 cmH20  ? ?Intake/Output Summary (Last 24 hours) at 03/28/2022 1011 ?Last data filed at 03/28/2022 6734 ?Gross per 24 hour  ?Intake 1645.41 ml  ?Output 1450 ml  ?Net 195.41 ml  ? ? ?Filed Weights  ? 03/26/22 0416 03/27/22 0500 03/28/22 0500  ?Weight: 48.9 kg 48.9 kg 50 kg  ? ? ?Examination: ? ?General: Chronically ill-appearing, emaciated, sitting up in bed, on vent ?HENT: NCAT tracheostomy in place ?PULM: Decreased breath sounds bilateral, no accessory muscle use ?CV: S1-S2 regular ?GI: BS+, soft, nontender ?MSK: normal bulk and tone ?Neuro: Awake, interactive, nonfocal ? ?Labs show normal electrolytes, no leukocytosis, stable anemia ? ?4/1 chest x-ray independently reviewed shows improved right infrahilar airspace disease ? ?Resolved Hospital Problem list   ?Aspiration pneumonia ? ?Assessment & Plan:  ?Principal Problem: ?  Acute respiratory failure with hypoxia (HCC) ?Active Problems: ?  COPD exacerbation ([place]) ?  Anxiety ?  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  Anemia associated with chemotherapy ?  Essential hypertension ?  Metastasis to lymph nodes ([place]) ?  Protein-calorie malnutrition, severe ?  Depression ?  Physical deconditioning ? ?Acute on chronic hypoxemic/hypercarbic respiratory failure from rhinovirus pneumonia with COPD exacerbation  ?Cigarette smoker ?Baseline severe COPD with extensive  centrilobular and bullous emphysema  ?Tracheostomy status with some bleeding now improved ? ?Continue tracheobronchial toilet due to copious secretions ?Plan for trach collar 24 hours as tolerated ?-prednisone  20mg  daily  ?Continue brovana/pulmicort/yupelri ?Continue lasix ? ?Transient atrial fibrillation RVR -started on IV amiodarone , has reverted to sinus rhythm, will continue for 24 hours and then discontinue if remains in rhythm ?-No anticoagulation due to trach bleeding ? ?Limited stage small cell lung cancer ?Continue XRT , started 2/21 ?F/U heme/onc recommendations> resume outpatient visits and infusions in 3-4 weeks ? ?Depression and anxiety ?Xanax  ? ? ?Protein calorie malnutrition, severe ?Continue tube feeds ?Failed modified barium swallow 3/31 , reassess after trach downsize but may need PEG eventually ? ?Anemia of chronic illness ?Monitor for bleeding ?Transfuse PRBC for Hgb < 7 gm/dL ? ?Hypertension ?Amlodipine ? ? ?Plan is to pursue LTAC discharge otherwise wean him off vent and plan for SNF ? ?Best Practice (right click and "Reselect all SmartList Selections" daily)  ? ?Diet/type: tubefeeds ?DVT prophylaxis: SCD > restart sub q heparin  ?GI prophylaxis: PPI ?Lines: N/A ?Foley:  N/A ?Code Status:  full code ?Last date of multidisciplinary goals of care discussion [3/24 with [facility];  he wants to continue with his XRT and pursue LTACH] ? ?Labs   ?CBC: ?Recent Labs  ?Lab 03/22/22 ?0328 03/23/22 ?0409 03/24/22 ?0443 03/25/22 ?0400 03/28/22 ?3559  ?WBC 10.7* 7.2 9.7 5.5 7.7  ?NEUTROABS  --   --   --  4.1  --   ?HGB 9.3* 9.1* 9.5* 9.2* 10.1*  ?HCT 29.0* 28.0* 30.2* 29.0* 32.0*  ?MCV 95.4 95.6 96.5 97.6 96.4  ?PLT 382 302 318 324 264  ? ? ? ?Basic Metabolic Panel: ?Recent Labs  ?Lab 03/22/22 ?0320 03/23/22 ?0409 03/24/22 ?0443 03/25/22 ?0400 03/27/22 ?0500 03/28/22 ?7416  ?NA 136 135 138 136 135 137  ?K 3.7 3.8 3.3* 4.5 3.6 3.7  ?CL 98 97* 100 99 97* 97*  ?CO2 31 32 33* 33* 32 33*  ?GLUCOSE 117* 137*  135* 126* 136* 132*  ?BUN 22 20 18  24* 23 28*  ?CREATININE 0.32* 0.33* 0.32* 0.35* 0.41* 0.42*  ?CALCIUM 8.2* 8.3* 8.6* 8.8* 8.9 9.1  ?MG 2.1 2.1  --   --   --   --   ?PHOS 3.3  --   --   --   --   --   ? ? ?GFR: ?Estimated Creatinine Clearance: 65.1 mL/min (A) (by C-G formula based on SCr of 0.42 mg/dL (L)). ?Recent Labs  ?Lab 03/23/22 ?0409 03/24/22 ?0443 03/25/22 ?0400 03/28/22 ?3845  ?WBC 7.2 9.7 5.5 7.7  ? ? ? ?Liver Function Tests: ?Recent Labs  ?Lab 03/22/22 ?0320 03/25/22 ?0400 03/27/22 ?0500  ?AST 18 16 17   ?ALT 19 17 20   ?ALKPHOS 59 61 69  ?BILITOT 0.6 0.2* 0.2*  ?PROT 6.2* 6.9 7.4  ?ALBUMIN 2.5* 2.6* 2.9*  ? ? ?No results for input(s): LIPASE, AMYLASE in the last 168 hours. ?No results for input(s): AMMONIA in the last 168 hours. ? ?ABG ?   ?Component Value Date/Time  ? PHART 7.35 03/21/2022 0054  ? PCO2ART 57 (H) 03/21/2022 0054  ? PO2ART 56 (L) 03/21/2022 0054  ? HCO3 32.0 (H) 03/21/2022 0054  ? ACIDBASEDEF 4.6 (H)  03/09/2022 1836  ? O2SAT 87.8 03/21/2022 0054  ? ?  ? ?Coagulation Profile: ?No results for input(s): INR, PROTIME in the last 168 hours. ? ? ?Cardiac Enzymes: ?No results for input(s): CKTOTAL, CKMB, CKMBINDEX, TROPONINI in the last 168 hours. ? ?HbA1C: ?No results found for: HGBA1C ? ?CBG: ?Recent Labs  ?Lab 03/27/22 ?1557 03/27/22 ?2014 03/28/22 ?0109 03/28/22 ?5573 03/28/22 ?0747  ?GLUCAP 121* 123* 125* 134* 140*  ? ? ? ?Critical care time: 33 minutes ?  ? ?Kara Mead MD. Shade Flood. ?[facility] ?Pager : [phone] ? ?If no response to pager , please call [phone] until 7 pm ?After 7:00 pm call Elink  [phone]  ? ?03/28/2022 ? ? ?

## 2022-03-28 NOTE — Progress Notes (Signed)
Placed PT on 35% ATC at approximately 0925- tolerating well at this time. PT has PMV on at this time with trach cuff down. Sp02 96%, HR 98, rr 16,  BP 154/103. RN aware. ?

## 2022-03-29 DIAGNOSIS — J9601 Acute respiratory failure with hypoxia: Secondary | ICD-10-CM | POA: Diagnosis not present

## 2022-03-29 DIAGNOSIS — Z930 Tracheostomy status: Secondary | ICD-10-CM

## 2022-03-29 DIAGNOSIS — C3412 Malignant neoplasm of upper lobe, left bronchus or lung: Secondary | ICD-10-CM | POA: Diagnosis not present

## 2022-03-29 DIAGNOSIS — E43 Unspecified severe protein-calorie malnutrition: Secondary | ICD-10-CM | POA: Diagnosis not present

## 2022-03-29 LAB — GLUCOSE, CAPILLARY
Glucose-Capillary: 103 mg/dL — ABNORMAL HIGH (ref 70–99)
Glucose-Capillary: 104 mg/dL — ABNORMAL HIGH (ref 70–99)
Glucose-Capillary: 113 mg/dL — ABNORMAL HIGH (ref 70–99)
Glucose-Capillary: 115 mg/dL — ABNORMAL HIGH (ref 70–99)
Glucose-Capillary: 96 mg/dL (ref 70–99)
Glucose-Capillary: 97 mg/dL (ref 70–99)

## 2022-03-29 LAB — BASIC METABOLIC PANEL
Anion gap: 7 (ref 5–15)
BUN: 31 mg/dL — ABNORMAL HIGH (ref 8–23)
CO2: 35 mmol/L — ABNORMAL HIGH (ref 22–32)
Calcium: 9.1 mg/dL (ref 8.9–10.3)
Chloride: 96 mmol/L — ABNORMAL LOW (ref 98–111)
Creatinine, Ser: 0.41 mg/dL — ABNORMAL LOW (ref 0.61–1.24)
GFR, Estimated: >60 mL/min (ref >60)
Glucose, Bld: 119 mg/dL — ABNORMAL HIGH (ref 70–99)
Potassium: 3.5 mmol/L (ref 3.5–5.1)
Sodium: 138 mmol/L (ref 135–145)

## 2022-03-29 LAB — CBC
HCT: 30.5 % — ABNORMAL LOW (ref 39.0–52.0)
Hemoglobin: 9.7 g/dL — ABNORMAL LOW (ref 13.0–17.0)
MCH: 30.7 pg (ref 26.0–34.0)
MCHC: 31.8 g/dL (ref 30.0–36.0)
MCV: 96.5 fL (ref 80.0–100.0)
Platelets: 222 10*3/uL (ref 150–400)
RBC: 3.16 MIL/uL — ABNORMAL LOW (ref 4.22–5.81)
RDW: 17.5 % — ABNORMAL HIGH (ref 11.5–15.5)
WBC: 5.6 10*3/uL (ref 4.0–10.5)
nRBC: 0 % (ref 0.0–0.2)

## 2022-03-29 LAB — PHOSPHORUS: Phosphorus: 4.6 mg/dL (ref 2.5–4.6)

## 2022-03-29 LAB — MAGNESIUM: Magnesium: 2.3 mg/dL (ref 1.7–2.4)

## 2022-03-29 MED ORDER — ALPRAZOLAM 1 MG PO TABS: 1.0000 mg | ORAL_TABLET | Freq: Every day | ORAL | Status: DC

## 2022-03-29 MED ORDER — FUROSEMIDE 40 MG PO TABS
40.0000 mg | ORAL_TABLET | Freq: Every day | ORAL | Status: DC
Start: 2022-03-30 — End: 2022-04-15
  Administered 2022-03-30 – 2022-04-14 (×16): 40 mg
  Filled 2022-03-29 (×16): qty 1

## 2022-03-29 MED ORDER — POTASSIUM CHLORIDE 20 MEQ PO PACK
20.0000 meq | PACK | Freq: Once | ORAL | Status: AC
  Administered 2022-03-29: 20 meq
  Filled 2022-03-29: qty 1

## 2022-03-29 MED ORDER — ALPRAZOLAM 0.5 MG PO TABS
0.5000 mg | ORAL_TABLET | Freq: Two times a day (BID) | ORAL | Status: DC
  Administered 2022-03-29: 0.5 mg
  Filled 2022-03-29: qty 1

## 2022-03-29 MED ORDER — POTASSIUM CHLORIDE CRYS ER 20 MEQ PO TBCR: 20.0000 meq | EXTENDED_RELEASE_TABLET | Freq: Once | ORAL | Status: DC

## 2022-03-29 MED ORDER — ALPRAZOLAM 0.5 MG PO TABS
0.5000 mg | ORAL_TABLET | Freq: Two times a day (BID) | ORAL | Status: DC
  Administered 2022-03-29: 0.5 mg via ORAL
  Filled 2022-03-29: qty 1

## 2022-03-29 NOTE — Progress Notes (Signed)
? ?NAME:  Mark Yu, MRN:  [id number], DOB:  09-21-1956, LOS: 65 ?ADMISSION DATE:  03/08/2022, CONSULTATION DATE:  3/14 ?REFERRING MD:  Danford, CHIEF COMPLAINT:  Dyspnea  ? ?History of Present Illness:  ?66 y/o male presented with limited stage small cell lung cancer and COPD admitted with acute on chronic respiratory failure with hypoxemia due to rhinovirus requiring intubation on 3/14.   ? ?Pertinent  Medical History  ?Severe COPD with emphysema, Depression, Anxiety, HTN, extensive stage SCLC ?Concurrent systemic chemotherapy and radiation treatment ?Rhinovirus infection ? ?Significant Hospital Events: ?Including procedures, antibiotic start and stop dates in addition to other pertinent events   ?3/12 Admit with dyspnea, AECOPD in setting of rhinovirus  ?3/14 PCCM consulted for evaluation of respiratory failure. Intubated for airway protection  ?3/17 On vent, XRT  ?3/18 tolerating pressure support today ?3/19 not weaning as well today with every episode of bronchospasms ?3/20 PRBC for hgb of 6.6, PSV wean & extubated.  Failed due to global weakness/inability to clear secretions > reintubated.  ?Tracheal aspirate negative normal flora ?3/22 extubated. D/w family and pt trach vs one-way extubation. Pt was non-committal. Was on NIPPV for about 8 hrs then had to be re-intubated ?3/23 vanc and cefepime started. On-going family and pt discussion. He agreed to trach  ?Tracheal aspirate-no growth ?MRSA PCR negative ?03/20/22- TRACH - [name] and [name] ?03/21/22 -status post tracheostomy 40% FiO2 10 L oxygen.  Currently on trach collar.  Overnight was on ventilator.  Also on fentanyl infusion and Precedex infusion.  Nursing reports that he gets very anxious when trying to wean.  Nevertheless he sitting with tracheostomy and typing on the keyboard and asking appropriate questions.  On antibiotics cefepime.  On Jevity tube feeds.  Afebrile since 03/18/2022.  White count is coming down. ?3/27 refused XRT again  today ?3/28 stopped cefepime ?3/30 ATC daytime, Required increased oxygen overnight and placed back on vent ?4/1Episode of AF/RVR, started back on amiodarone drip ? ?Interim History / Subjective:  ? ?Chronic critically ill, trach collar daytime back on vent overnight. ?Remains in sinus rhythm on amiodarone drip ?Read Steve's memo -he has several requests today ? ?Objective   ?Blood pressure (!) 183/99, pulse 85, temperature 98.2 ?F (36.8 ?C), temperature source Oral, resp. rate 16, height 5\' 11"  (1.803 m), weight 41.2 kg, SpO2 96 %. ?   ?Vent Mode: PRVC ?FiO2 (%):  [30 %-35 %] 35 % ?Set Rate:  [14 bmp] 14 bmp ?Vt Set:  [600 mL] 600 mL ?PEEP:  [5 cmH20] 5 cmH20 ?Plateau Pressure:  [10 cmH20-16 cmH20] 16 cmH20  ? ?Intake/Output Summary (Last 24 hours) at 03/29/2022 1001 ?Last data filed at 03/29/2022 0500 ?Gross per 24 hour  ?Intake 1516.05 ml  ?Output 1750 ml  ?Net -233.95 ml  ? ? ?Filed Weights  ? 03/27/22 0500 03/28/22 0500 03/29/22 0500  ?Weight: 48.9 kg 50 kg 41.2 kg  ? ? ?Examination: ? ?General: Chronically ill-appearing, emaciated, sitting up in bed, on vent ?HENT: NCAT tracheostomy in place ?PULM: No accessory muscle use, bilateral ventilated breath sounds ?CV: S1-S2 regular ?GI: BS+, soft, nontender ?MSK: Muscle wasting, cachectic ?Neuro: Awake, interactive, nonfocal ? ?Labs show mild hypokalemia, no leukocytosis, stable anemia ? ?4/1 chest x-ray independently reviewed shows improved right infrahilar airspace disease ? ?Resolved Hospital Problem list   ?Aspiration pneumonia ? ?Assessment & Plan:  ?Principal Problem: ?  Acute respiratory failure with hypoxia (HCC) ?Active Problems: ?  COPD exacerbation ([place]) ?  Anxiety ?  Small cell carcinoma of  upper lobe of left lung ([place]) ?  Anemia associated with chemotherapy ?  Essential hypertension ?  Metastasis to lymph nodes ([place]) ?  Protein-calorie malnutrition, severe ?  Depression ?  Physical deconditioning ? ?Acute on chronic hypoxemic/hypercarbic respiratory failure  from rhinovirus pneumonia with COPD exacerbation  ?Cigarette smoker ?Baseline severe COPD with extensive centrilobular and bullous emphysema  ?Tracheostomy status with some bleeding, resolved ? ?Continue tracheobronchial toilet due to copious secretions ?Plan for trach collar 24 hours as tolerated ?-prednisone  20mg  daily  ?Continue brovana/pulmicort/yupelri ?Continue lasix ? ?Transient atrial fibrillation RVR -reverted to sinus rhythm with IV amiodarone ?-discontinue now ?-No anticoagulation due to trach bleeding ?-Restarted Lovenox 4/1 ? ?Limited stage small cell lung cancer ?Continue XRT , started 2/21 ?F/U heme/onc recommendations> resume outpatient visits and infusions in 3-4 weeks ? ?Depression and anxiety ?Xanax -dose at 0.5 twice daily 1 with 1 mg nightly ? ? ?Protein calorie malnutrition, severe ?Continue tube feeds ?Failed modified barium swallow 3/31 , reassess after trach downsize but may need PEG eventually ? ?Anemia of chronic illness ?Monitor for bleeding ?Transfuse PRBC for Hgb < 7 gm/dL ? ?Hypertension ?Amlodipine ? ? ?Hope to wean him off vent and plan for SNF ?Needs aggressive PT/OT ? ?Best Practice (right click and "Reselect all SmartList Selections" daily)  ? ?Diet/type: tubefeeds ?DVT prophylaxis: SCD > restart sub q heparin  ?GI prophylaxis: PPI ?Lines: N/A ?Foley:  N/A ?Code Status:  full code ?Last date of multidisciplinary goals of care discussion [3/24 with [facility];  he wants to continue with his XRT and pursue LTACH] ? ?Labs   ?CBC: ?Recent Labs  ?Lab 03/23/22 ?0409 03/24/22 ?0443 03/25/22 ?0400 03/28/22 ?9767 03/29/22 ?0512  ?WBC 7.2 9.7 5.5 7.7 5.6  ?NEUTROABS  --   --  4.1  --   --   ?HGB 9.1* 9.5* 9.2* 10.1* 9.7*  ?HCT 28.0* 30.2* 29.0* 32.0* 30.5*  ?MCV 95.6 96.5 97.6 96.4 96.5  ?PLT 302 318 324 264 222  ? ? ? ?Basic Metabolic Panel: ?Recent Labs  ?Lab 03/23/22 ?0409 03/24/22 ?0443 03/25/22 ?0400 03/27/22 ?0500 03/28/22 ?3419 03/29/22 ?0512  ?NA 135 138 136 135 137 138  ?K 3.8 3.3*  4.5 3.6 3.7 3.5  ?CL 97* 100 99 97* 97* 96*  ?CO2 32 33* 33* 32 33* 35*  ?GLUCOSE 137* 135* 126* 136* 132* 119*  ?BUN 20 18 24* 23 28* 31*  ?CREATININE 0.33* 0.32* 0.35* 0.41* 0.42* 0.41*  ?CALCIUM 8.3* 8.6* 8.8* 8.9 9.1 9.1  ?MG 2.1  --   --   --   --  2.3  ?PHOS  --   --   --   --   --  4.6  ? ? ?GFR: ?Estimated Creatinine Clearance: 53.6 mL/min (A) (by C-G formula based on SCr of 0.41 mg/dL (L)). ?Recent Labs  ?Lab 03/24/22 ?0443 03/25/22 ?0400 03/28/22 ?3790 03/29/22 ?0512  ?WBC 9.7 5.5 7.7 5.6  ? ? ? ?Liver Function Tests: ?Recent Labs  ?Lab 03/25/22 ?0400 03/27/22 ?0500  ?AST 16 17  ?ALT 17 20  ?ALKPHOS 61 69  ?BILITOT 0.2* 0.2*  ?PROT 6.9 7.4  ?ALBUMIN 2.6* 2.9*  ? ? ?No results for input(s): LIPASE, AMYLASE in the last 168 hours. ?No results for input(s): AMMONIA in the last 168 hours. ? ?ABG ?   ?Component Value Date/Time  ? PHART 7.35 03/21/2022 0054  ? PCO2ART 57 (H) 03/21/2022 0054  ? PO2ART 56 (L) 03/21/2022 0054  ? HCO3 32.0 (H) 03/21/2022 0054  ? ACIDBASEDEF  4.6 (H) 03/09/2022 1836  ? O2SAT 87.8 03/21/2022 0054  ? ?  ? ?Coagulation Profile: ?No results for input(s): INR, PROTIME in the last 168 hours. ? ? ?Cardiac Enzymes: ?No results for input(s): CKTOTAL, CKMB, CKMBINDEX, TROPONINI in the last 168 hours. ? ?HbA1C: ?No results found for: HGBA1C ? ?CBG: ?Recent Labs  ?Lab 03/28/22 ?1611 03/28/22 ?1951 03/28/22 ?2316 03/29/22 ?0355 03/29/22 ?4035  ?GLUCAP 117* 112* 117* 115* 113*  ? ? ? ?Critical care time: 31 minutes ?  ? ?Kara Mead MD. Shade Flood. ?[facility] ?Pager : [phone] ? ?If no response to pager , please call [phone] until 7 pm ?After 7:00 pm call Elink  [phone]  ? ?03/29/2022 ? ? ?

## 2022-03-29 NOTE — Progress Notes (Signed)
Dr. Elsworth Soho and respiratory talked to the Pt about wearing the ATC at [facility]. The Pt said he would try. We explained to the Pt that he wouldn't be able to go home on the ventilator. ?

## 2022-03-30 ENCOUNTER — Ambulatory Visit: Payer: Medicare Other | Admitting: Internal Medicine

## 2022-03-30 ENCOUNTER — Ambulatory Visit: Payer: Medicare Other

## 2022-03-30 ENCOUNTER — Ambulatory Visit
Admission: RE | Admit: 2022-03-30 | Discharge: 2022-03-30 | Disposition: A | Discharge status: Home / Self Care | Payer: Medicare Other | Source: Ambulatory Visit | Attending: Radiation Oncology | Admitting: Radiation Oncology

## 2022-03-30 DIAGNOSIS — J9601 Acute respiratory failure with hypoxia: Secondary | ICD-10-CM | POA: Diagnosis not present

## 2022-03-30 LAB — GLUCOSE, CAPILLARY
Glucose-Capillary: 104 mg/dL — ABNORMAL HIGH (ref 70–99)
Glucose-Capillary: 116 mg/dL — ABNORMAL HIGH (ref 70–99)
Glucose-Capillary: 104 mg/dL — ABNORMAL HIGH (ref 70–99)
Glucose-Capillary: 86 mg/dL (ref 70–99)

## 2022-03-30 LAB — BASIC METABOLIC PANEL
Anion gap: 6 (ref 5–15)
BUN: 28 mg/dL — ABNORMAL HIGH (ref 8–23)
CO2: 32 mmol/L (ref 22–32)
Calcium: 9 mg/dL (ref 8.9–10.3)
Chloride: 99 mmol/L (ref 98–111)
Creatinine, Ser: 0.36 mg/dL — ABNORMAL LOW (ref 0.61–1.24)
GFR, Estimated: >60 mL/min (ref >60)
Glucose, Bld: 125 mg/dL — ABNORMAL HIGH (ref 70–99)
Potassium: 3.7 mmol/L (ref 3.5–5.1)
Sodium: 137 mmol/L (ref 135–145)

## 2022-03-30 MED ORDER — ALPRAZOLAM 0.5 MG PO TABS
0.5000 mg | ORAL_TABLET | ORAL | Status: DC | PRN
  Administered 2022-03-30 (×2): 1 mg via ORAL
  Administered 2022-03-31 – 2022-04-01 (×4): 0.5 mg via ORAL
  Administered 2022-04-01: 1 mg via ORAL
  Administered 2022-04-01: 0.5 mg via ORAL
  Administered 2022-04-01: 1 mg via ORAL
  Administered 2022-04-01: 0.5 mg via ORAL
  Administered 2022-04-02 (×2): 1 mg via ORAL
  Administered 2022-04-02: 0.5 mg via ORAL
  Administered 2022-04-03: 1 mg via ORAL
  Administered 2022-04-03: 0.5 mg via ORAL
  Administered 2022-04-04 (×3): 1 mg via ORAL
  Administered 2022-04-05 (×2): 0.5 mg via ORAL
  Administered 2022-04-05 – 2022-04-06 (×3): 1 mg via ORAL
  Administered 2022-04-06 – 2022-04-08 (×5): 0.5 mg via ORAL
  Administered 2022-04-08: 1 mg via ORAL
  Administered 2022-04-08 – 2022-04-09 (×2): 0.5 mg via ORAL
  Administered 2022-04-09: 1 mg via ORAL
  Administered 2022-04-09 (×2): 0.5 mg via ORAL
  Administered 2022-04-10: 1 mg via ORAL
  Administered 2022-04-10 (×3): 0.5 mg via ORAL
  Administered 2022-04-11: 1 mg via ORAL
  Administered 2022-04-11: 0.5 mg via ORAL
  Administered 2022-04-11 (×2): 1 mg via ORAL
  Administered 2022-04-11 – 2022-04-12 (×4): 0.5 mg via ORAL
  Administered 2022-04-12 – 2022-04-13 (×3): 1 mg via ORAL
  Administered 2022-04-13: 0.5 mg via ORAL
  Administered 2022-04-13 – 2022-04-14 (×6): 1 mg via ORAL
  Administered 2022-04-15: 0.5 mg via ORAL
  Administered 2022-04-15: 1 mg via ORAL
  Administered 2022-04-15 – 2022-04-16 (×4): 0.5 mg via ORAL
  Administered 2022-04-16 (×2): 1 mg via ORAL
  Administered 2022-04-17: 0.5 mg via ORAL
  Administered 2022-04-17: 1 mg via ORAL
  Administered 2022-04-17: 0.5 mg via ORAL
  Filled 2022-03-30: qty 1
  Filled 2022-03-30: qty 2
  Filled 2022-03-30: qty 1
  Filled 2022-03-30: qty 2
  Filled 2022-03-30: qty 1
  Filled 2022-03-30 (×3): qty 2
  Filled 2022-03-30 (×3): qty 1
  Filled 2022-03-30 (×4): qty 2
  Filled 2022-03-30: qty 1
  Filled 2022-03-30 (×2): qty 2
  Filled 2022-03-30: qty 1
  Filled 2022-03-30 (×2): qty 2
  Filled 2022-03-30: qty 1
  Filled 2022-03-30 (×5): qty 2
  Filled 2022-03-30 (×5): qty 1
  Filled 2022-03-30: qty 2
  Filled 2022-03-30: qty 1
  Filled 2022-03-30 (×2): qty 2
  Filled 2022-03-30: qty 1
  Filled 2022-03-30 (×2): qty 2
  Filled 2022-03-30 (×4): qty 1
  Filled 2022-03-30: qty 2
  Filled 2022-03-30: qty 1
  Filled 2022-03-30: qty 2
  Filled 2022-03-30: qty 1
  Filled 2022-03-30: qty 2
  Filled 2022-03-30: qty 1
  Filled 2022-03-30 (×2): qty 2
  Filled 2022-03-30: qty 1
  Filled 2022-03-30 (×2): qty 2
  Filled 2022-03-30: qty 1
  Filled 2022-03-30: qty 2
  Filled 2022-03-30 (×6): qty 1
  Filled 2022-03-30: qty 2
  Filled 2022-03-30: qty 1
  Filled 2022-03-30 (×5): qty 2
  Filled 2022-03-30: qty 1

## 2022-03-30 MED ORDER — ORAL CARE MOUTH RINSE
15.0000 mL | Freq: Two times a day (BID) | OROMUCOSAL | Status: DC
  Administered 2022-03-31 – 2022-04-16 (×19): 15 mL via OROMUCOSAL

## 2022-03-30 MED ORDER — CHLORHEXIDINE GLUCONATE 0.12 % MT SOLN
15.0000 mL | Freq: Two times a day (BID) | OROMUCOSAL | Status: DC
  Administered 2022-03-30 – 2022-04-17 (×35): 15 mL via OROMUCOSAL
  Filled 2022-03-30 (×28): qty 15

## 2022-03-30 MED ORDER — POTASSIUM CHLORIDE 20 MEQ PO PACK
40.0000 meq | PACK | Freq: Once | ORAL | Status: AC
Start: 2022-03-30 — End: 2022-03-30
  Administered 2022-03-30: 40 meq
  Filled 2022-03-30: qty 2

## 2022-03-30 MED ORDER — GUAIFENESIN 100 MG/5ML PO LIQD
30.0000 mL | Freq: Every day | ORAL | Status: DC
  Administered 2022-03-30 – 2022-04-14 (×15): 30 mL
  Filled 2022-03-30 (×15): qty 30

## 2022-03-30 MED ORDER — ALPRAZOLAM 0.5 MG PO TABS
0.5000 mg | ORAL_TABLET | Freq: Once | ORAL | Status: AC
  Administered 2022-03-30: 0.5 mg
  Filled 2022-03-30: qty 1

## 2022-03-30 NOTE — TOC Progression Note (Signed)
Transition of Care (TOC) - Progression Note  ? ? ?Patient Details  ?Name: Mark Yu ?MRN: [id number] ?Date of Birth: 1956-12-15 ? ?Transition of Care (TOC) CM/SW Contact  ?Anjolie Majer, Marjie Skiff, RN ?Phone Number: ?03/30/2022, 1:02 PM ? ?Clinical Narrative:    ?Reached out to [place] at [facility] to inquire about pending auth. No decision yet and per Noelle Penner has up to 14 days to decide. Auth started on 3/27.  TOC will continue to follow. ? ? ?Expected Discharge Plan: Home/Self Care ?Barriers to Discharge: Insurance Authorization ? ?Expected Discharge Plan and Services ?Expected Discharge Plan: Home/Self Care ?  ?Discharge Planning Services: CM Consult ?  ?Living arrangements for the past 2 months: [place] ?                ?  ? Readmission Risk Interventions ? ?  03/09/2022  ? 12:52 PM  ?Readmission Risk Prevention Plan  ?Transportation Screening Complete  ?PCP or Specialist Appt within 3-5 Days Complete  ?[place] or Home Care Consult Complete  ?Social Work Consult for [place] Planning/Counseling Complete  ?Medication Review Press photographer) Complete  ? ? ?

## 2022-03-30 NOTE — Progress Notes (Signed)
Patient requested inpatient optometry visit to have eye prescription checked. Informed patient optometry does not typically come into the hospital for routine eye checks, but would pass request on to care team.  ?

## 2022-03-30 NOTE — Progress Notes (Signed)
Physical Therapy Treatment ?Patient Details ?Name: Mark Yu ?MRN: [id number] ?DOB: [birth year] ?Today's Date: 03/30/2022 ? ? ?History of Present Illness 66 yo male smoker presented to [facility] with dyspnea.   Found to have rhinovirus, pna, COPD exacerbation. had progressive hypoxia and transferred to SDU 3/13 and started on Bipap.  Respiratory status and mental status worsened,  intubated on 3/14, Ext 3/22 and re-intubated, trach 3/24.  PMH: COPD with emphysema and recent diagnosis of extensive stage SCLC on concurrent chemoradiation therapy. ? ?  ?PT Comments  ? ? Patient pleasant and motivated to work with therapy today. Pt required min guard for safety and assist for line management to move to EOB. VSS sitting and pt able to hold balance unsupported. Mod+2 assist required for rise from EOB, pt completed 2x and Mod +2 assist provided to block knees and prevent buckling with side steps to move to recliner. Educated pt to remain up for ~1-2 hours to gradually increase activity tolerance. Acute PT will continue to progress pt as able. ?  ?Recommendations for follow up therapy are one component of a multi-disciplinary discharge planning process, led by the attending physician.  Recommendations may be updated based on patient status, additional functional criteria and insurance authorization. ? ?Follow Up Recommendations ? Skilled nursing-short term rehab (<3 hours/day) ?  ?  ?Assistance Recommended at Discharge Frequent or constant Supervision/Assistance  ?Patient can return home with the following A lot of help with bathing/dressing/bathroom;Two people to help with bathing/dressing/bathroom;Help with stairs or ramp for entrance;Direct supervision/assist for medications management;Assistance with feeding;Assist for transportation;Assistance with cooking/housework ?  ?Equipment Recommendations ? None recommended by PT  ?  ?Recommendations for Other Services   ? ? ?  ?Precautions / Restrictions Precautions ?Precautions:  Fall ?Precaution Comments: T-collar, panda, multiple lines and leads/monitor VS ?Restrictions ?Weight Bearing Restrictions: No  ?  ? ?Mobility ? Bed Mobility ?Overal bed mobility: Needs Assistance ?Bed Mobility: Supine to Sit ?  ?  ?Supine to sit: Min guard, HOB elevated ?  ?  ?  ?  ? ?Transfers ?Overall transfer level: Needs assistance ?Equipment used: 2 person hand held assist ?Transfers: Sit to/from Stand, Bed to chair/wheelchair/BSC ?Sit to Stand: Mod assist, +2 physical assistance, +2 safety/equipment ?  ?Step pivot transfers: Mod assist, +2 physical assistance, +2 safety/equipment ?  ?  ?  ?  ?  ? ?Ambulation/Gait ?  ?  ?  ?  ?  ?  ?  ?  ? ? ?Stairs ?  ?  ?  ?  ?  ? ? ?Wheelchair Mobility ?  ? ?Modified Rankin (Stroke Patients Only) ?  ? ? ?  ?Balance Overall balance assessment: Needs assistance ?Sitting-balance support: Feet supported ?Sitting balance-Leahy Scale: Good ?  ?  ?Standing balance support: During functional activity, Bilateral upper extremity supported ?Standing balance-Leahy Scale: Poor ?  ?  ?  ?  ?  ?  ?  ?  ?  ?  ?  ?  ?  ? ?  ?Cognition Arousal/Alertness: Awake/alert ?Behavior During Therapy: [facility] for tasks assessed/performed ?Overall Cognitive Status: Within Functional Limits for tasks assessed ?  ?  ?  ?  ?  ?  ?  ?  ?  ?  ?  ?  ?  ?  ?  ?  ?  ?  ?  ? ?  ?Exercises   ? ?  ?General Comments   ?  ?  ? ?Pertinent Vitals/Pain Pain Assessment ?Pain Assessment: No/denies pain  ? ? ?Home Living   ?  ?  ?  ?  ?  ?  ?  ?  ?  ?   ?  ?  Prior Function    ?  ?  ?   ? ?PT Goals (current goals can now be found in the care plan section) Acute Rehab PT Goals ?Patient Stated Goal: get stronger ?PT Goal Formulation: With patient ?Time For Goal Achievement: 04/06/22 ?Potential to Achieve Goals: Fair ?Progress towards PT goals: Progressing toward goals ? ?  ?Frequency ? ? ? Min 2X/week ? ? ? ?  ?PT Plan Current plan remains appropriate  ? ? ?Co-evaluation   ?  ?  ?  ?  ? ?  ?AM-PAC PT "6 Clicks" Mobility    ?Outcome Measure ? Help needed turning from your back to your side while in a flat bed without using bedrails?: A Little ?Help needed moving from lying on your back to sitting on the side of a flat bed without using bedrails?: A Little ?Help needed moving to and from a bed to a chair (including a wheelchair)?: A Lot ?Help needed standing up from a chair using your arms (e.g., wheelchair or bedside chair)?: A Lot ?Help needed to walk in hospital room?: Total ?Help needed climbing 3-5 steps with a railing? : Total ?6 Click Score: 12 ? ?  ?End of Session Equipment Utilized During Treatment: Gait belt;Oxygen;Other (comment) (trach collar) ?Activity Tolerance: Patient tolerated treatment well ?Patient left: in chair;with call bell/phone within reach;with chair alarm set;with family/visitor present ?Nurse Communication: Mobility status ?PT Visit Diagnosis: Other abnormalities of gait and mobility (R26.89);Difficulty in walking, not elsewhere classified (R26.2);Muscle weakness (generalized) (M62.81) ?  ? ? ?Time: 5176-1607 ?PT Time Calculation (min) (ACUTE ONLY): 33 min ? ?Charges:  $Therapeutic Activity: 23-37 mins          ?          ? ?Gwynneth Albright PT, DPT ?Acute Rehabilitation Services ?Office [phone] ?Pager [phone] ? ? ? ?Jacques Navy ?03/30/2022, 1:44 PM ? ?

## 2022-03-30 NOTE — Progress Notes (Signed)
? ?NAME:  Mark Yu, MRN:  [id number], DOB:  [birth year], LOS: 21 ?ADMISSION DATE:  03/08/2022, CONSULTATION DATE:  3/14 ?REFERRING MD:  Danford, CHIEF COMPLAINT:  Dyspnea  ? ?History of Present Illness:  ?66 y/o male presented with limited stage small cell lung cancer and COPD admitted with acute on chronic respiratory failure with hypoxemia due to rhinovirus requiring intubation on 3/14.   ? ?Pertinent  Medical History  ?Severe COPD with emphysema, Depression, Anxiety, HTN, extensive stage SCLC ?Concurrent systemic chemotherapy and radiation treatment ?Rhinovirus infection ? ?Significant Hospital Events: ?Including procedures, antibiotic start and stop dates in addition to other pertinent events   ?3/12 Admit with dyspnea, AECOPD in setting of rhinovirus  ?3/14 PCCM consulted for evaluation of respiratory failure. Intubated for airway protection  ?3/17 On vent, XRT  ?3/18 tolerating pressure support today ?3/19 not weaning as well today with every episode of bronchospasms ?3/20 PRBC for hgb of 6.6, PSV wean & extubated.  Failed due to global weakness/inability to clear secretions > reintubated.  ?Tracheal aspirate negative normal flora ?3/22 extubated. D/w family and pt trach vs one-way extubation. Pt was non-committal. Was on NIPPV for about 8 hrs then had to be re-intubated ?3/23 vanc and cefepime started. On-going family and pt discussion. He agreed to trach  ?Tracheal aspirate-no growth ?MRSA PCR negative ?03/20/22- TRACH - Mark Yu and Mark Yu ?03/21/22 -status post tracheostomy 40% FiO2 10 L oxygen.  Currently on trach collar.  Overnight was on ventilator.  Also on fentanyl infusion and Precedex infusion.  Nursing reports that he gets very anxious when trying to wean.  Nevertheless he sitting with tracheostomy and typing on the keyboard and asking appropriate questions.  On antibiotics cefepime.  On Jevity tube feeds.  Afebrile since 03/18/2022.  White count is coming down. ?3/27 refused XRT again  today ?3/28 stopped cefepime ?3/30 ATC daytime, Required increased oxygen overnight and placed back on vent ?4/1Episode of AF/RVR, started back on amiodarone drip ? ?Interim History / Subjective:  ?Doing excellent this AM. ?Remains with PMV overnight. ?Has a number of requests that I have addressed in orders. ? ?Objective   ?Blood pressure (!) 152/86, pulse 85, temperature 97.7 ?F (36.5 ?C), temperature source Oral, resp. rate (!) 28, height 5\' 11"  (1.803 m), weight 41 kg, SpO2 100 %. ?   ?FiO2 (%):  [28 %-35 %] 28 %  ? ?Intake/Output Summary (Last 24 hours) at 03/30/2022 2505 ?Last data filed at 03/30/2022 0800 ?Gross per 24 hour  ?Intake 1717.71 ml  ?Output 1250 ml  ?Net 467.71 ml  ? ? ?Filed Weights  ? 03/28/22 0500 03/29/22 0500 03/30/22 0500  ?Weight: 50 kg 41.2 kg 41 kg  ? ? ?Examination: ?Loquacious man in NAD ?Temporal wasting ?NGT in place ?Weak, moves all 4 ext to command ?Port accessed ?Lungs diminished bilaterally ? ?Resolved Hospital Problem list   ?Aspiration pneumonia ? ?Assessment & Plan:  ?Principal Problem: ?  Acute respiratory failure with hypoxia (HCC) ?Active Problems: ?  COPD exacerbation ([place]) ?  Anxiety ?  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  Anemia associated with chemotherapy ?  Essential hypertension ?  Metastasis to lymph nodes ([place]) ?  Protein-calorie malnutrition, severe ?  Depression ?  Physical deconditioning ? ?Acute on chronic hypoxemic/hypercarbic respiratory failure from rhinovirus pneumonia with COPD exacerbation  ?Cigarette smoker ?Baseline severe COPD with extensive centrilobular and bullous emphysema  ?Tracheostomy status with some bleeding, resolved ? ?Continue tracheobronchial toilet due to copious secretions ?Plan for  trach collar 24 hours as tolerated ?-prednisone  20mg  daily  ?Continue brovana/pulmicort/yupelri ?Continue lasix ?Guaifenesin per his request ? ?Transient atrial fibrillation RVR -reverted to sinus rhythm with IV amiodarone ?- If recurs, needs  full AC ?- Telemetry, keep K/Mg WNL ? ?Limited stage small cell lung cancer ?- Continue XRT , started 2/21 ?- F/U heme/onc recommendations> resume outpatient visits and infusions in 3-4 weeks ? ?Depression and anxiety ?- Xanax adjust per his request ? ?Protein calorie malnutrition, severe ?Continue tube feeds ?Failed modified barium swallow 3/31 , to be reassessed today ?May be able to downsize him this week which would help ?Hopefully can avoid PEG ? ?Anemia of chronic illness ?Monitor for bleeding ?Transfuse PRBC for Hgb < 7 gm/dL ? ?Hypertension ?Amlodipine ? ?Maybe CIR candidate? ?Will see how he progresses through week ?Looks great today ? ?Best Practice (right click and "Reselect all SmartList Selections" daily)  ? ?Diet/type: tubefeeds ?DVT prophylaxis: SCD > restart sub q heparin  ?GI prophylaxis: PPI ?Lines: N/A ?Foley:  N/A ?Code Status:  full code ?Last date of multidisciplinary goals of care discussion [3/24 with [facility];  he wants to continue with his XRT and pursue LTACH] ? ?Mark Emery MD PCCM ? ?

## 2022-03-30 NOTE — Progress Notes (Signed)
Speech Language Pathology Treatment: Nada Boozer Speaking valve  ?Patient Details ?Name: Tywon Niday ?MRN: [id number] ?DOB: 15-Oct-1956 ?Today's Date: 03/30/2022 ?Time: 7902-4097 ?SLP Time Calculation (min) (ACUTE ONLY): 45 min ? ?Assessment / Plan / Recommendation ?Clinical Impression ? Patient seen by SLP for skilled treatment focused on PMV usage and patient education. Patient was already wearing PMV when SLP entered room (cuff already deflated) and he reported that he has been wearing it since previous date and that he did sleep with it on. SLP advised patient that recommendation is for wearing when awake but remove when sleeping. Patient achieving very clear, strong voicing and he reports it is close to his baseline. He had one incident of coughing and expectorating (using suction himself) small amount of clear thin secretions but otherwise, no wet sounding voice, no other coughing or throat clearing. Patient told SLP of his theory of why he failed swallowing test (MBS) on Friday which is that he hasn't swallowed in so long "he forgot how". Although he does want to have repeat swallow test at some point, he did not seem to be in a hurry. SLP attempted to discuss potential  need for longer term non-oral nutrition (PEG) but patient did not engage in this topic and just stated that he feels he would be able to eat some soft foods like "mashed potatoes" after he gets the feeding tube removed. SLP will continue efforts to educate patient regarding short and long term POC in regards to swallow function and PMV use. ?  ?HPI HPI: with medical history of recently diagnosed small cell lung cancer, COPD, anxiety, HTN, and depression. He presented to the ED due to progressive shortness of breath with minimal exertion x2 days. he had been finished a steroid taper shortly before admission and had recently been prescribed an appetite stimulant.     Significant Events:  3/12- admission  3/14- intubation; OGT placement; initial  RD assessment; tube feeding initiation   3/20- extubation + re-intubation; OGT removal + replacement  3/22- extubation + re-intubation; OGT removal + replacement  3/24- tracheostomy + diagnostic bronch; OGT removed; small bore NGT placed in R nare (distal gastric per abdominal x-ray) per CCM notes.  Pt has undergone PMSV trials with adequate tolerance and phonation but rapid fatigue and increased amount of secretions present.   MBS indicated to determine if pt may initiate any po. Pt required vent use overnight on 3/30 to 3/31 due to fatigue. ?  ?   ?SLP Plan ? Continue with current plan of care ? ?  ?  ?Recommendations for follow up therapy are one component of a multi-disciplinary discharge planning process, led by the attending physician.  Recommendations may be updated based on patient status, additional functional criteria and insurance authorization. ?  ? ?Recommendations  ?Diet recommendations: NPO ?Medication Administration: Via alternative means  ?   ? Patient may use Passy-Muir Speech Valve: During all waking hours (remove during sleep) ?PMSV Supervision: Intermittent ?MD: Please consider changing trach tube to : Cuffless;Smaller size  ?   ? ? ? ? Oral Care Recommendations: Oral care BID;Patient independent with oral care ?Follow Up Recommendations: SLP at Long-term acute care hospital ?Assistance recommended at discharge: Frequent or constant Supervision/Assistance ?SLP Visit Diagnosis: Aphonia (R49.1) ?Plan: Continue with current plan of care ? ? ? ? ?  ?  ? ?Sonia Baller, MA, CCC-SLP ?Speech Therapy ? ?

## 2022-03-30 NOTE — Progress Notes (Signed)
eLink Physician-Brief Progress Note ?Patient Name: Mark Yu ?DOB: 1956/02/13 ?MRN: [id number] ? ? ?Date of Service ? 03/30/2022  ?HPI/Events of Note ? Asking for xanax that scheduled to start tonight for anxiety/insomnia. Trach status, Notes reviewed. ? ? ?Discussed with bed side RN. Has 2 orders for xanax. O.5 q12 hrs from 17:00. And 1 mg at bed time from 3 rd.  ?eICU Interventions ? Xanax 0.5 mg ordered per tube .   ? ? ? ?Intervention Category ?Minor Interventions: Other:;Agitation / anxiety - evaluation and management ? ?Elmer Sow ?03/30/2022, 12:01 AM ?

## 2022-03-31 ENCOUNTER — Inpatient Hospital Stay (HOSPITAL_COMMUNITY): Payer: Medicare Other

## 2022-03-31 ENCOUNTER — Ambulatory Visit: Payer: Medicare Other

## 2022-03-31 DIAGNOSIS — J9601 Acute respiratory failure with hypoxia: Secondary | ICD-10-CM | POA: Diagnosis not present

## 2022-03-31 DIAGNOSIS — Z930 Tracheostomy status: Secondary | ICD-10-CM

## 2022-03-31 DIAGNOSIS — I480 Paroxysmal atrial fibrillation: Secondary | ICD-10-CM

## 2022-03-31 LAB — BASIC METABOLIC PANEL
Anion gap: 6 (ref 5–15)
BUN: 32 mg/dL — ABNORMAL HIGH (ref 8–23)
CO2: 31 mmol/L (ref 22–32)
Calcium: 9.2 mg/dL (ref 8.9–10.3)
Chloride: 103 mmol/L (ref 98–111)
Creatinine, Ser: 0.44 mg/dL — ABNORMAL LOW (ref 0.61–1.24)
GFR, Estimated: >60 mL/min (ref >60)
Glucose, Bld: 103 mg/dL — ABNORMAL HIGH (ref 70–99)
Potassium: 4 mmol/L (ref 3.5–5.1)
Sodium: 140 mmol/L (ref 135–145)

## 2022-03-31 LAB — MAGNESIUM
Magnesium: 2.4 mg/dL (ref 1.7–2.4)
Magnesium: 2.1 mg/dL (ref 1.7–2.4)

## 2022-03-31 LAB — GLUCOSE, CAPILLARY
Glucose-Capillary: 114 mg/dL — ABNORMAL HIGH (ref 70–99)
Glucose-Capillary: 115 mg/dL — ABNORMAL HIGH (ref 70–99)

## 2022-03-31 LAB — PHOSPHORUS
Phosphorus: 4.2 mg/dL (ref 2.5–4.6)
Phosphorus: 4.4 mg/dL (ref 2.5–4.6)

## 2022-03-31 MED ORDER — PROSOURCE TF PO LIQD
45.0000 mL | Freq: Three times a day (TID) | ORAL | Status: DC
  Administered 2022-03-31 – 2022-04-09 (×25): 45 mL
  Filled 2022-03-31 (×29): qty 45

## 2022-03-31 MED ORDER — FREE WATER
100.0000 mL | Status: DC
  Administered 2022-03-31 – 2022-04-09 (×46): 100 mL

## 2022-03-31 MED ORDER — JEVITY 1.5 CAL/FIBER PO LIQD
1000.0000 mL | ORAL | Status: DC
  Administered 2022-03-31 – 2022-04-01 (×2): 1000 mL
  Filled 2022-03-31 (×6): qty 1000

## 2022-03-31 MED ORDER — SODIUM BICARBONATE 650 MG PO TABS
650.0000 mg | ORAL_TABLET | Freq: Once | ORAL | Status: DC
  Filled 2022-03-31: qty 1

## 2022-03-31 MED ORDER — PANCRELIPASE (LIP-PROT-AMYL) 10440-39150 UNITS PO TABS
20880.0000 [IU] | ORAL_TABLET | Freq: Once | ORAL | Status: DC
  Filled 2022-03-31: qty 2

## 2022-03-31 NOTE — Progress Notes (Addendum)
Nutrition Follow-up ? ?DOCUMENTATION CODES:  ? ?Severe malnutrition in context of chronic illness, Underweight ? ?INTERVENTION:  ?- will adjust TF regimen: Jevity 1.5 @ 35 ml/hr x18 hours/day (1600-0800) to advance by 10 ml every 18 hours of run time to reach goal rate of 75 ml/hr x18 hours/day (1600-0800) with 45 ml Prosource TF (or equivalent) TID and 100 ml every 4 hours. ? ?- at goal rate, this regimen will provide 2185 kcal, 28 grams fiber, 119 grams protein, and 1626 ml free water. ? ? ?NUTRITION DIAGNOSIS:  ? ?Severe Malnutrition related to chronic illness, cancer and cancer related treatments as evidenced by severe fat depletion, severe muscle depletion. -ongoing ? ?GOAL:  ? ?Patient will meet greater than or equal to 90% of their needs -to be met with TF regimen ? ?MONITOR:  ? ?TF tolerance, Diet advancement, Labs, Weight trends, Skin ? ?ASSESSMENT:  ? ?66 y.o. male with medical history of recently diagnosed small cell lung cancer, COPD, anxiety, HTN, and depression. He presented to the ED due to progressive shortness of breath with minimal exertion x2 days. he had been finished a steroid taper shortly before admission and had recently been prescribed an appetite stimulant. ? ?Significant Events: ?3/12- admission ?3/14- intubation; OGT placement; initial RD assessment; tube feeding initiation  ?3/20- extubation + re-intubation; OGT removal + replacement ?3/22- extubation + re-intubation; OGT removal + replacement ?3/24- tracheostomy + diagnostic bronch; OGT removed; small bore NGT placed in R nare (distal gastric per abdominal x-ray) ? ? ?Patient discussed in rounds this AM and with RN one-on-one. Patient's small bore NGT has been clogged and TF on hold since ~0845 today. Possible need for replacement. Patient has been off the vent, on ATC x48 hours.  ? ?He underwent MBS on 3/31 but failed; possible repeat MBS today. Estimated needs updated for patient consistently off of vent support.  ? ?Order currently  in place for Jevity 1.2 @ 50 ml/hr with 45 ml Prosource TF QID. ? ?This regimen is providing 1600 kcal, 111 grams protein, and 968 ml free water. ? ?Weight today is the lowest weight this hospitalization. Weight previously stable 3/22-4/1 and trending down since that time. Mild pitting edema to BLE documented in the edema section of flow sheet.  ? ?Per notes, plan for LTAC at time of d/c. ? ? ?Labs reviewed; CBG: 114 mg/dl, BUN: 32 mg/dl, creatinine: 0.44 mg/dl. ? ?Medications reviewed; 1000 units cholecalciferol/day, 1 mg folvite/day, 40 mg lasix per NGT/day, PRN imodium, 400 mg mag-ox/day, 40 mg protonix/day, 17 g miralax/day, 1000 mcg cyanocobalamin per NGT/day, 220 mg zinc sulfate/day.  ? ? ?Diet Order:   ?Diet Order   ? ?       ?  Diet NPO time specified  Diet effective now       ?  ? ?  ?  ? ?  ? ? ?EDUCATION NEEDS:  ? ?No education needs have been identified at this time ? ?Skin:  Skin Assessment: Skin Integrity Issues: ?Skin Integrity Issues:: DTI ?DTI: vertebral column (newly documented on 3/19) ? ?Last BM:  3/31 (type 5, medium amount) ? ?Height:  ? ?Ht Readings from Last 1 Encounters:  ?03/18/22 $RemoveBe'5\' 11"'[id number]$  (1.803 m)  ? ? ?Weight:  ? ?Wt Readings from Last 1 Encounters:  ?03/31/22 39.2 kg  ? ? ?BMI:  Body mass index is 12.05 kg/m?. ? ?Estimated Nutritional Needs:  ?Kcal:  2100-2350 kcal ?Protein:  105-115 grams ?Fluid:  >/= 2 L/day ? ? ? ? ?Mark Matin, MS, RD,  LDN ?Registered Dietitian II ?Inpatient Clinical Nutrition ?RD pager # and on-call/weekend pager # available in [place]  ? ?

## 2022-03-31 NOTE — Progress Notes (Signed)
SLP Cancellation Note ? ?Patient Details ?Name: Mark Yu ?MRN: [id number] ?DOB: April 02, 1956 ? ? ?Cancelled treatment:       Reason Eval/Treat Not Completed: Other (comment) (RT getting ready to change out pt's trach to a cuffless #6)  Will continue efforts.  ? ? ?Kathleen Lime, MS CCC SLP ?Acute Rehab Services ?Office [phone] ?Pager [phone] ? ?[name] ?03/31/2022, 3:33 PM ?

## 2022-03-31 NOTE — Procedures (Signed)
Tracheostomy Change Note ? ?Patient Details:   ?Name: Mark Yu ?DOB: 1956-10-03 ?MRN: [id number] ?   ?Airway Documentation: ?   ? ?Evaluation ? O2 sats: stable throughout ?Complications: No apparent complications ?Patient did tolerate procedure well. ?Bilateral Breath Sounds: Clear, Diminished ?  ? ?[name] ?03/31/2022, 4:02 PM ?

## 2022-03-31 NOTE — Progress Notes (Signed)
? ?NAME:  Mark Yu, MRN:  [id number], DOB:  11-Dec-1956, LOS: 76 ?ADMISSION DATE:  03/08/2022, CONSULTATION DATE:  3/14 ?REFERRING MD:  Danford, CHIEF COMPLAINT:  Dyspnea  ? ?History of Present Illness:  ?66 y/o male presented with limited stage small cell lung cancer and COPD admitted with acute on chronic respiratory failure with hypoxemia due to rhinovirus requiring intubation on 3/14.   ? ?Pertinent  Medical History  ?Severe COPD with emphysema, Depression, Anxiety, HTN, extensive stage SCLC ?Concurrent systemic chemotherapy and radiation treatment ?Rhinovirus infection ? ?Significant Hospital Events: ?Including procedures, antibiotic start and stop dates in addition to other pertinent events   ?3/12 Admit with dyspnea, AECOPD in setting of rhinovirus  ?3/14 PCCM consulted for evaluation of respiratory failure. Intubated for airway protection  ?3/17 On vent, XRT  ?3/18 tolerating pressure support today ?3/19 not weaning as well today with every episode of bronchospasms ?3/20 PRBC for hgb of 6.6, PSV wean & extubated.  Failed due to global weakness/inability to clear secretions > reintubated.  ?Tracheal aspirate negative normal flora ?3/22 extubated. D/w family and pt trach vs one-way extubation. Pt was non-committal. Was on NIPPV for about 8 hrs then had to be re-intubated ?3/23 vanc and cefepime started. On-going family and pt discussion. He agreed to trach  ?Tracheal aspirate-no growth ?MRSA PCR negative ?03/20/22- TRACH - [name] and [name] ?03/21/22 -status post tracheostomy 40% FiO2 10 L oxygen.  Currently on trach collar.  Overnight was on ventilator.  Also on fentanyl infusion and Precedex infusion.  Nursing reports that he gets very anxious when trying to wean.  Nevertheless he sitting with tracheostomy and typing on the keyboard and asking appropriate questions.  On antibiotics cefepime.  On Jevity tube feeds.  Afebrile since 03/18/2022.  White count is coming down. ?3/27 refused XRT again  today ?3/28 stopped cefepime ?3/30 ATC daytime, Required increased oxygen overnight and placed back on vent ?4/1Episode of AF/RVR, started back on amiodarone drip ? ?Interim History / Subjective:  ?No events. ?On TC all night. ? ?Objective   ?Blood pressure (!) 145/87, pulse 85, temperature 97.6 ?F (36.4 ?C), temperature source Oral, resp. rate (!) 25, height 5\' 11"  (1.803 m), weight 39.2 kg, SpO2 97 %. ?   ?FiO2 (%):  [28 %] 28 %  ? ?Intake/Output Summary (Last 24 hours) at 03/31/2022 0827 ?Last data filed at 03/31/2022 0600 ?Gross per 24 hour  ?Intake 1543 ml  ?Output 1550 ml  ?Net -7 ml  ? ? ?Filed Weights  ? 03/29/22 0500 03/30/22 0500 03/31/22 0500  ?Weight: 41.2 kg 41 kg 39.2 kg  ? ? ?Examination: ?NAD ?Trach in place with PMV ?Lungs with scattered rhonic ?Severe muscle wasting ?Raynauds? On fingers stable, scattered bruising ? ?BMP looks good ?CBC stable ? ?Resolved Hospital Problem list   ?Aspiration pneumonia ? ?Assessment & Plan:  ?Principal Problem: ?  Acute respiratory failure with hypoxia (HCC) ?Active Problems: ?  COPD exacerbation ([place]) ?  Anxiety ?  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  Anemia associated with chemotherapy ?  Essential hypertension ?  Metastasis to lymph nodes ([place]) ?  Protein-calorie malnutrition, severe ?  Depression ?  Physical deconditioning ?  Tracheostomy dependent ([place]) ?  PAF (paroxysmal atrial fibrillation) ([place]) ? ?Acute on chronic hypoxemic/hypercarbic respiratory failure from rhinovirus pneumonia with COPD exacerbation  ?Cigarette smoker ?Baseline severe COPD with extensive centrilobular and bullous emphysema  ?Tracheostomy status with some bleeding, resolved ? ?-Continue tracheobronchial toilet due to copious secretions ?-  Continue TC ?- Switch from cuffed 6-0 to uncuffed, per RT secretions too much to consider downsize at present ?-Continue brovana/pulmicort/yupelri ?-Continue PO lasix ?-Guaifenesin per his request ? ?Transient atrial fibrillation RVR  -reverted to sinus rhythm with IV amiodarone ?- If recurs, needs full AC ?- Telemetry, keep K/Mg WNL ? ?Limited stage small cell lung cancer ?- Continue XRT , started 2/21 ?- F/U heme/onc recommendations> resume outpatient visits and infusions in 3-4 weeks ? ?Depression and anxiety ?- Xanax per his request ? ?Protein calorie malnutrition, severe ?Continue tube feeds ?Failed modified barium swallow 3/31 , to be reassessed today ?Hopefully can avoid PEG ? ?Anemia of chronic illness ?Monitor for bleeding ?Transfuse PRBC for Hgb < 7 gm/dL ? ?Hypertension ?Amlodipine ? ?Plan to go to [facility] once radiation plans done ? ?Best Practice (right click and "Reselect all SmartList Selections" daily)  ? ?Diet/type: tubefeeds ?DVT prophylaxis: subQ heparin ?GI prophylaxis: PPI ?Lines: N/A ?Foley:  N/A ?Code Status:  full code ?Last date of multidisciplinary goals of care discussion [3/24 with [facility];  he wants to continue with his XRT and pursue LTACH] ? ?Will check in again Monday 04/06/22 ? ?[name] MD PCCM ? ?

## 2022-03-31 NOTE — Progress Notes (Signed)
OT Cancellation Note ? ?Patient Details ?Name: Mark Yu ?MRN: [id number] ?DOB: Jun 13, 1956 ? ? ?Cancelled Treatment:    Reason Eval/Treat Not Completed: Patient declined, no reason specified ?Patient declined to participate in therapy on this date stating that he wanted to hang out with his girlfriend who was present in room. Patient was educated on importance of movement. Patient verbalized understanding but continued to decline to participate.  ?Ranya Fiddler OTR/L, MS ?Acute Rehabilitation Department ?Office# [phone] ?Pager# [phone] ? ? ? ?03/31/2022, 12:07 PM ?

## 2022-03-31 NOTE — Progress Notes (Signed)
?PROGRESS NOTE ?Mark Yu  SJG:[id number] DOB: 13-Oct-1956 DOA: 03/08/2022 ?PCP: [name], MD  ? 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 In thE ER, tachypneic to 20s, having severe SOB, SpO2 86% on room air. ?3/12: admitted  for Ae of COPD, CTA  No PE, new infiltrates, Rhinovirus+ **CODE BLUE** in PM after returning from RadTx, walked to bathroom without, found gray and agonal immediately after, bagged and transferred to [facility] on BiPAP ?3/13: Initially improved mentation and ABG on BiPAP but in AM again nearly unresponsive, again tachypneic to 30s, intubated. Had complicated hospital course with respiratory failure persistent w/ hx of COPD now s/p trach 3/24- weaning> on TC. PCCM following for downsizing and perhaps decannulation. And he is on XRT for concurrent lung cancer. Plannign for SELECT/LTACH once XRT is finished (~7 days or so) ?Major events: ?3/12 Admit with dyspnea, AECOPD in setting of rhinovirus  ?3/14 PCCM consulted for evaluation of respiratory failure. Intubated for airway protection  ?3/17 On vent, XRT  ?3/18 tolerating pressure support today ?3/19 not weaning as well today with every episode of bronchospasms ?3/20 PRBC for hgb of 6.6, PSV wean & extubated.  Failed due to global weakness/inability to clear secretions > reintubated.  ?Tracheal aspirate negative normal flora ?3/22 extubated. D/w family and pt trach vs one-way extubation. Pt was non-committal. Was on NIPPV for about 8 hrs then had to be re-intubated ?3/23 vanc and cefepime started. On-going family and pt discussion. He agreed to trach  ?Tracheal aspirate-no growth ?MRSA PCR negative ?03/20/22- TRACH - [name] and [name] ?03/21/22 -status post tracheostomy 40% FiO2 10 L oxygen.  Currently on trach collar.  Overnight was on ventilator.  Also on fentanyl infusion and Precedex infusion.   Nursing reports that he gets very anxious when trying to wean.  Nevertheless he sitting with tracheostomy and typing on the keyboard and asking appropriate questions.  On antibiotics cefepime.  On Jevity tube feeds.  Afebrile since 03/18/2022.  White count is coming down. ?3/27 refused XRT again today ?3/28 stopped cefepime ?3/30 ATC daytime, Required increased oxygen overnight and placed back on vent ?4/1Episode of AF/RVR, started back on amiodarone drip ?  ?  ?Subjective: ?Seen and examined this morning.  Resting comfortably on trach collar.  Complains of being weakness which is generalized, denies nausea vomiting chest pain. ?Overnight afebrile Tmax 98.2, on trach collar at 5 L saturating 94-99 percentage ?Labs this a.m. with a stable BMP, last CBC in 4/2 with hemoglobin 9.7 g ? ?Assessment and Plan: ?Principal Problem: ?  Acute respiratory failure with hypoxia (HCC) ?Active Problems: ?  COPD exacerbation ([place]) ?  Tracheostomy dependent ([place]) ?  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  Metastasis to lymph nodes ([place]) ?  Anxiety ?  Anemia associated with chemotherapy ?  Essential hypertension ?  Protein-calorie malnutrition, severe ?  Depression ?  Physical deconditioning ?  PAF (paroxysmal atrial fibrillation) ([place]) ? 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  Doing well on 5 L.  Pulmonary managing continue pulmonary toileting, Brovana/Pulmicortyupleri, p.o. Lasix, antitussives. ?Switched from cuffed6-0 2 uncuffed today, RT managing secretions and considering downsizing. ? ?  Transient A-fib with RVR while in ICU patient back in sinus rhythm with IV amiodarone.  If recurs will need full anticoagulation per  ICU recommendation monitor in telemetry. ? 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 ?Anxiety disorder ?Depression: ?Mood is stable. ? ?Anemia associated with chemotherapy ?Anemia of chronic disease: ?Monitor hemoglobin. ?Recent Labs  ?Lab 03/25/22 ?0400 03/28/22 ?0865 03/29/22 ?0512  ?HGB 9.2* 10.1* 9.7*  ?HCT 29.0* 32.0* 30.5*  ?  ?Essential hypertension: Controlled on amlodipine. ? ?Protein-calorie malnutrition, severe: Continue to augment nutritional status, currently with NG tube feeding.  Dietitian following. ? ?Physical deconditioning: Continue PT OT, and significant generalized weakness ? ?Hyponatremia-resolved  ? ?Goals of care currently full code, patient was seen by palliative care, remains to be seen with multiple comorbidities  ? ?DVT prophylaxis: enoxaparin (LOVENOX) injection 30 mg Start: 03/28/22 1200 ?Code Status:   Code Status: Full Code ?Family Communication: plan of care discussed with patient at bedside. ?Patient status is: Inpatient level of care: ICU  ?Remains inpatient because: Ongoing management of respiratory failure and ongoing need for radiation ?Patient currently not stable ? ?Dispo: The patient is from: Home ?           Anticipated disposition: To be decided, LTAC evaluating once completion with XRT ? ?Mobility Assessment (last 72 hours)   ? ? Mobility Assessment   ? ? [place] Name 03/30/22 1300 03/30/22 0800  ?  ?  ?  ? Does patient have an order for bedrest or is patient medically unstable -- No - Continue assessment     ? What is the highest level of mobility based on the progressive mobility assessment? Level 3 (Stands with assist) - Balance while standing  and cannot march in place Level 2 ([place]) - Balance while sitting on edge of bed and cannot stand     ? Is the above level different from baseline mobility prior to current illness? -- Yes - Recommend  PT order     ? ?  ?  ? ?  ?  ? ?Objective: ?Vitals last 24 hrs: ?Vitals:  ? 03/31/22 0500 03/31/22 0600 03/31/22 0752 03/31/22 0822  ?BP:      ?Pulse: 88 85    ?Resp: 19 (!) 25    ?Temp:    97.6 ?F (36.4 ?C)  ?TempSrc:    Oral  ?SpO2: 99% 97% 94%   ?Weight: 39.2 kg     ?Height:      ? ?Weight change: -1.8 kg ? ?Physical Examination: ?General exam: AA oriented, pleasant, thin malnourished,older than stated age, weak appearing. ?HEENT:Oral mucosa moist, Ear/Nose WNL grossly, dentition normal. ?Respiratory system: bilaterally diminished BS, trach collar present  ?Cardiovascular system: S1 & S2 +, No JVD,. ?Gastrointestinal system: Abdomen soft,NT,ND, BS+. NGT+ ?Nervous System:Alert, awake, moving extremities and grossly nonfocal ?Extremities: LE edema none,distal peripheral pulses palpable.  ?Skin: No rashes,no icterus. ?MSK: Normal muscle bulk,tone, power ? ?Medications reviewed:  ?Scheduled Meds: ? amLODipine  2.5 mg Per Tube Daily  ? arformoterol  15 mcg Nebulization BID  ? budesonide (PULMICORT) nebulizer solution  0.5 mg Nebulization BID  ? chlorhexidine  15 mL Mouth Rinse BID  ? Chlorhexidine Gluconate Cloth  6 each Topical Daily  ? cholecalciferol  1,000 Units Per Tube Daily  ? enoxaparin (LOVENOX) injection  30 mg Subcutaneous Daily  ? feeding supplement (PROSource TF)  45 mL Per Tube QID  ?  folic acid  1 mg Per Tube Daily  ? furosemide  40 mg Per Tube Daily  ? guaiFENesin  30 mL Per Tube Q0600  ? magnesium oxide  400 mg Per Tube Daily  ? mouth rinse  15 mL Mouth Rinse q12n4p  ? nystatin   Topical BID  ? pantoprazole sodium  40 mg Per Tube Daily  ? polyethylene glycol  17 g Per Tube Daily  ? revefenacin  175 mcg Nebulization Daily  ? sodium chloride flush  10-40 mL Intracatheter Q12H  ? sodium chloride flush  3 mL Intravenous Q12H  ? vitamin B-12  1,000 mcg Per Tube Daily  ? zinc sulfate  220 mg Per Tube Daily  ?Continuous Infusions: ? sodium chloride Stopped (03/27/22 1055)  ? feeding supplement (JEVITY 1.2  CAL) 1,000 mL (03/30/22 2148)  ? ?Diet Order   ? ?       ?  Diet NPO time specified  Diet effective now       ?  ? ?  ?  ? ?  ? ?Intake/Output Summary (Last 24 hours) at 03/31/2022 0850 ?Last data filed at

## 2022-04-01 ENCOUNTER — Ambulatory Visit
Admission: RE | Admit: 2022-04-01 | Discharge: 2022-04-01 | Disposition: A | Discharge status: Home / Self Care | Payer: Medicare Other | Source: Ambulatory Visit | Attending: Radiation Oncology | Admitting: Radiation Oncology

## 2022-04-01 ENCOUNTER — Ambulatory Visit: Payer: Medicare Other

## 2022-04-01 ENCOUNTER — Encounter: Payer: Self-pay | Admitting: *Deleted

## 2022-04-01 DIAGNOSIS — J9601 Acute respiratory failure with hypoxia: Secondary | ICD-10-CM | POA: Diagnosis not present

## 2022-04-01 LAB — BASIC METABOLIC PANEL
Anion gap: 7 (ref 5–15)
BUN: 28 mg/dL — ABNORMAL HIGH (ref 8–23)
CO2: 31 mmol/L (ref 22–32)
Calcium: 9.1 mg/dL (ref 8.9–10.3)
Chloride: 101 mmol/L (ref 98–111)
Creatinine, Ser: 0.44 mg/dL — ABNORMAL LOW (ref 0.61–1.24)
GFR, Estimated: >60 mL/min (ref >60)
Glucose, Bld: 114 mg/dL — ABNORMAL HIGH (ref 70–99)
Potassium: 3.5 mmol/L (ref 3.5–5.1)
Sodium: 139 mmol/L (ref 135–145)

## 2022-04-01 LAB — GLUCOSE, CAPILLARY
Glucose-Capillary: 117 mg/dL — ABNORMAL HIGH (ref 70–99)
Glucose-Capillary: 81 mg/dL (ref 70–99)
Glucose-Capillary: 115 mg/dL — ABNORMAL HIGH (ref 70–99)

## 2022-04-01 LAB — CBC
HCT: 29.8 % — ABNORMAL LOW (ref 39.0–52.0)
Hemoglobin: 9.5 g/dL — ABNORMAL LOW (ref 13.0–17.0)
MCH: 30.4 pg (ref 26.0–34.0)
MCHC: 31.9 g/dL (ref 30.0–36.0)
MCV: 95.2 fL (ref 80.0–100.0)
Platelets: 217 10*3/uL (ref 150–400)
RBC: 3.13 MIL/uL — ABNORMAL LOW (ref 4.22–5.81)
RDW: 17.2 % — ABNORMAL HIGH (ref 11.5–15.5)
WBC: 8.9 10*3/uL (ref 4.0–10.5)
nRBC: 0 % (ref 0.0–0.2)

## 2022-04-01 LAB — MAGNESIUM
Magnesium: 2.1 mg/dL (ref 1.7–2.4)
Magnesium: 2.1 mg/dL (ref 1.7–2.4)

## 2022-04-01 LAB — PHOSPHORUS
Phosphorus: 4.4 mg/dL (ref 2.5–4.6)
Phosphorus: 4.2 mg/dL (ref 2.5–4.6)

## 2022-04-01 NOTE — Progress Notes (Signed)
Oncology Nurse Navigator Documentation ? ? ?  04/01/2022  ?  3:00 PM 03/27/2022  ? 11:00 AM 03/23/2022  ?  4:00 PM 02/09/2022  ?  4:00 PM 02/04/2022  ?  9:00 AM 01/14/2022  ?  8:00 AM 01/13/2022  ? 12:00 PM  ?Oncology Nurse Navigator Flowsheets  ?Confirmed Diagnosis Date     02/02/2022    ?Diagnosis Status    Confirmed Diagnosis Complete Pathology Pending    ?Planned Course of Treatment   Chemo/Radiation Concurrent      ?Phase of Treatment   Radiation      ?Chemotherapy Actual Start Date:   02/18/2022      ?Radiation Actual Start Date:   02/17/2022      ?Navigator Follow Up Date: 04/08/2022 04/02/2022 03/26/2022 02/12/2022 02/09/2022 01/23/2022 01/16/2022  ?Navigator Follow Up Reason: Other: Review Note Appointment Review Appointment Review Follow-up After Biopsy Radiology Appointment Review  ?Navigator Location [facility]  ?Navigator Encounter Type Appt/Treatment Plan Review Appt/Treatment Plan Review Appt/Treatment Plan Review Clinic/[facility] Telephone Other: Other:  ?Telephone     Outgoing Call    ?Patient Visit Type    MedOnc Other    ?Treatment Phase   Treatment Pre-Tx/Tx Discussion Pre-Tx/Tx Discussion Abnormal Scans Abnormal Scans  ?Barriers/Navigation Needs Coordination of Care Coordination of Care Coordination of Care Education Coordination of Care;Education Coordination of Care Coordination of Care  ?Education    Newly Diagnosed Cancer Education;Other Other    ?Interventions Coordination of Care/I followed up on Mr. Nulty's treatment.  He is still in the hospital with discharge plan of SNF when appropriate. Will follow up on his discharge date.  Coordination of Care Coordination of Care Coordination of Care;Psycho-Social Support Coordination of Care;Education;Psycho-Social Support Coordination of Care Coordination of Care  ?Acuity Level 2-Minimal Needs (1-2 Barriers Identified) Level 2-Minimal Needs (1-2 Barriers  Identified) Level 2-Minimal Needs (1-2 Barriers Identified) Level 3-Moderate Needs (3-4 Barriers Identified) Level 2-Minimal Needs (1-2 Barriers Identified) Level 2-Minimal Needs (1-2 Barriers Identified) Level 2-Minimal Needs (1-2 Barriers Identified)  ?Coordination of Care Other Appts   Appts Other Other  ?Education Method    Verbal;Other Verbal    ?Time Spent with Patient 30 30 30 30 30 15 30   ?  ?

## 2022-04-01 NOTE — Progress Notes (Deleted)
Physical Therapy Treatment ?Patient Details ?Name: Mark Yu ?MRN: [id number] ?DOB: [birth year] ?Today's Date: 04/01/2022 ? ? ?History of Present Illness 66 yo male smoker presented to [facility] with dyspnea.   Found to have rhinovirus, pna, COPD exacerbation. had progressive hypoxia and transferred to SDU 3/13 and started on Bipap.  Respiratory status and mental status worsened,  intubated on 3/14, Ext 3/22 and re-intubated, trach 3/24.  PMH: COPD with emphysema and recent diagnosis of extensive stage SCLC on concurrent chemoradiation therapy. ? ?  ?PT Comments  ? ? The patient received  in recliner. Patient did ambulate x 5' x 2 with Rw. Patient  reports feeling fatigued today. Continue PT for mobility.   ?Recommendations for follow up therapy are one component of a multi-disciplinary discharge planning process, led by the attending physician.  Recommendations may be updated based on patient status, additional functional criteria and insurance authorization. ? ?Follow Up Recommendations ? PT at Long-term acute care hospital ?  ?  ?Assistance Recommended at Discharge Frequent or constant Supervision/Assistance  ?Patient can return home with the following A lot of help with bathing/dressing/bathroom;Two people to help with bathing/dressing/bathroom;Help with stairs or ramp for entrance;Direct supervision/assist for medications management;Assistance with feeding;Assist for transportation;Assistance with cooking/housework ?  ?Equipment Recommendations ? None recommended by PT  ?  ?Recommendations for Other Services   ? ? ?  ?Precautions / Restrictions Precautions ?Precautions: Fall ?Precaution Comments: T-collar, G tube, multiple lines and leads/monitor VS  ?  ? ?Mobility ? Bed Mobility ?  ?  ?  ?  ?  ?  ?  ?General bed mobility comments: in recliner ?  ? ?Transfers ?Overall transfer level: Needs assistance ?Equipment used: Rolling walker (2 wheels) ?Transfers: Sit to/from Stand ?Sit to Stand: Mod assist, +2 physical  assistance, +2 safety/equipment ?  ?  ?  ?  ?  ?General transfer comment: Mod Assist of 2    to power up from recliner, performed x 2  times ?  ? ?Ambulation/Gait ?Ambulation/Gait assistance: Mod assist, +2 physical assistance, +2 safety/equipment ?Gait Distance (Feet): 5 Feet (x2) ?Assistive device: Rolling walker (2 wheels) ?Gait Pattern/deviations: Step-to pattern, Shuffle ?Gait velocity: decr ?  ?  ?General Gait Details: Patient  shuffled  x 5 '.using RW. ? ? ?Stairs ?  ?  ?  ?  ?  ? ? ?Wheelchair Mobility ?  ? ?Modified Rankin (Stroke Patients Only) ?  ? ? ?  ?Balance   ?  ?  ?  ?  ?  ?  ?  ?  ?  ?  ?  ?  ?  ?  ?  ?  ?  ?  ?  ? ?  ?Cognition Arousal/Alertness: Awake/alert ?Behavior During Therapy: Flat affect ?Overall Cognitive Status: Within Functional Limits for tasks assessed ?  ?  ?  ?  ?  ?  ?  ?  ?  ?  ?  ?  ?  ?  ?  ?  ?General Comments: follows  directions with delay.Used  PMV briefly but had increased secretions ?  ?  ? ?  ?Exercises   ? ?  ?General Comments   ?  ?  ? ?Pertinent Vitals/Pain Pain Assessment ?Pain Assessment: Faces ?Faces Pain Scale: Hurts a little bit ?Pain Location: indicated no pain but did point to his abdomen  ? ? ?Home Living   ?  ?  ?  ?  ?  ?  ?  ?  ?  ?   ?  ?  Prior Function    ?  ?  ?   ? ?PT Goals (current goals can now be found in the care plan section) Progress towards PT goals: Progressing toward goals ? ?  ?Frequency ? ? ? Min 2X/week ? ? ? ?  ?PT Plan Current plan remains appropriate;Discharge plan needs to be updated  ? ? ?Co-evaluation PT/OT/SLP Co-Evaluation/Treatment: Yes ?Reason for Co-Treatment: For patient/therapist safety;Complexity of the patient's impairments (multi-system involvement);To address functional/ADL transfers ?PT goals addressed during session: Mobility/safety with mobility ?OT goals addressed during session: ADL's and self-care ?  ? ?  ?AM-PAC PT "6 Clicks" Mobility   ?Outcome Measure ? Help needed turning from your back to your side while in a flat  bed without using bedrails?: A Little ?Help needed moving from lying on your back to sitting on the side of a flat bed without using bedrails?: A Little ?Help needed moving to and from a bed to a chair (including a wheelchair)?: A Lot ?Help needed standing up from a chair using your arms (e.g., wheelchair or bedside chair)?: A Lot ?Help needed to walk in hospital room?: Total ?Help needed climbing 3-5 steps with a railing? : Total ?6 Click Score: 12 ? ?  ?End of Session Equipment Utilized During Treatment: Oxygen;Other (comment) ?Activity Tolerance: Patient limited by fatigue ?Patient left: in chair;with call bell/phone within reach;with family/visitor present ?Nurse Communication: Mobility status ?PT Visit Diagnosis: Other abnormalities of gait and mobility (R26.89);Difficulty in walking, not elsewhere classified (R26.2);Muscle weakness (generalized) (M62.81) ?  ? ? ?Time: 2876-8115 ?PT Time Calculation (min) (ACUTE ONLY): 28 min ? ?Charges:  $Gait Training: 8-22 mins          ?          ? ?Tresa Endo PT ?Acute Rehabilitation Services ?Pager [phone] ?Office [phone] ? ? ? ?Claretha Cooper ?04/01/2022, 2:51 PM ? ?

## 2022-04-01 NOTE — Progress Notes (Signed)
Occupational Therapy Treatment ?Patient Details ?Name: Mark Yu ?MRN: [id number] ?DOB: 1956-04-28 ?Today's Date: 04/01/2022 ? ? ?History of present illness 66 yo male smoker presented to [facility] with dyspnea.   Found to have rhinovirus, pna, COPD exacerbation. had progressive hypoxia and transferred to SDU 3/13 and started on Bipap.  Respiratory status and mental status worsened,  intubated on 3/14, Ext 3/22 and re-intubated, trach 3/24.  PMH: COPD with emphysema and recent diagnosis of extensive stage SCLC on concurrent chemoradiation therapy. ?  ?OT comments ? Patient agreed to transfer out of bed on this date. STEDY was used to focus on standing tolerance with +2 for safety. Patient was able to practice standing for brief time with one UE support and reach posteriorly in prep for ADL tasks with no LOB. Patient would continue to benefit from skilled OT services at this time while admitted and after d/c to address noted deficits in order to improve overall safety and independence in ADLs.  ?Patient's discharge plan remains appropriate at this time. OT will continue to follow acutely.  ?  ? ?Recommendations for follow up therapy are one component of a multi-disciplinary discharge planning process, led by the attending physician.  Recommendations may be updated based on patient status, additional functional criteria and insurance authorization. ?   ?Follow Up Recommendations ? OT at Long-term acute care hospital  ?  ?Assistance Recommended at Discharge Frequent or constant Supervision/Assistance  ?Patient can return home with the following ? Two people to help with walking and/or transfers;Two people to help with bathing/dressing/bathroom;Assistance with cooking/housework;Direct supervision/assist for medications management;Direct supervision/assist for financial management;Assist for transportation;Help with stairs or ramp for entrance ?  ?Equipment Recommendations ? Other (comment) (defer to next venue)  ?   ?Recommendations for Other Services   ? ?  ?Precautions / Restrictions Precautions ?Precautions: Fall ?Precaution Comments: T-collar, NG tube, multiple lines and leads/monitor VS ?Restrictions ?Weight Bearing Restrictions: No  ? ? ?  ? ?Mobility Bed Mobility ?Overal bed mobility: Needs Assistance ?Bed Mobility: Supine to Sit ?  ?  ?Supine to sit: Min guard, HOB elevated ?  ?  ?General bed mobility comments: extra  time for lines, no physical assist. ?  ? ?Transfers ?  ?  ?  ?  ?  ?  ?  ?  ?  ?  ?  ?  ?Balance Overall balance assessment: Needs assistance ?Sitting-balance support: Feet supported ?Sitting balance-Leahy Scale: Good ?  ?  ?Standing balance support: Bilateral upper extremity supported, During functional activity, Reliant on assistive device for balance ?Standing balance-Leahy Scale: Poor ?  ?  ?  ?  ?  ?  ?  ?  ?  ?  ?  ?  ?   ? ?ADL either performed or assessed with clinical judgement  ? ?ADL Overall ADL's : Needs assistance/impaired ?  ?  ?  ?  ?  ?  ?  ?  ?  ?  ?  ?  ?  ?  ?  ?  ?  ?  ?  ?General ADL Comments: patient reported that he would get out of bed with therapy on this date. patient appears to like to guide what occurs during session with very detailed instructions for placement of all belongings. patient while standing in STEDY was able to take one UE off the STEDY and reach posterioly on either side without LOB. patient was noted to bump his own posterior hand on ulnar side on STEDY on R side when reaching around.  no skin tear or new skin discoloration noted. nurse made aware. ?  ? ?Extremity/Trunk Assessment   ?  ?  ?  ?  ?  ? ?Vision   ?  ?  ?Perception   ?  ?Praxis   ?  ? ?Cognition Arousal/Alertness: Awake/alert ?Behavior During Therapy: [facility] for tasks assessed/performed ?Overall Cognitive Status: Within Functional Limits for tasks assessed ?  ?  ?  ?  ?  ?  ?  ?  ?  ?  ?  ?  ?  ?  ?  ?  ?General Comments: patient was agreeable to participation in session. ?  ?  ?   ?Exercises Other  Exercises ?Other Exercises: patient was educated on continuing to use yellow theraband in room during the day. patient reported that he does do reps with it but speads them out during the day to not get fatigued. ?Other Exercises: patient was provided with larger stress ball to maintain ROM in digits and hand. patient reported increasd ability to use this one v.s. smaller yellow one. patient compelted a few reps while therapist was in room. ? ?  ?Shoulder Instructions   ? ? ?  ?General Comments held to stedy bar, remained standing  ? ? ?Pertinent Vitals/ Pain       Pain Assessment ?Pain Assessment: Faces ?Faces Pain Scale: No hurt ?Pain Intervention(s): Monitored during session ? ?Home Living   ?  ?  ?  ?  ?  ?  ?  ?  ?  ?  ?  ?  ?  ?  ?  ?  ?  ?  ? ?  ?Prior Functioning/Environment    ?  ?  ?  ?   ? ?Frequency ? Min 2X/week  ? ? ? ? ?  ?Progress Toward Goals ? ?OT Goals(current goals can now be found in the care plan section) ? Progress towards OT goals: Progressing toward goals ? ?   ?Plan Discharge plan remains appropriate   ? ?Co-evaluation ? ? ? PT/OT/SLP Co-Evaluation/Treatment: Yes ?Reason for Co-Treatment: Complexity of the patient's impairments (multi-system involvement);For patient/therapist safety ?PT goals addressed during session: Mobility/safety with mobility ?OT goals addressed during session: ADL's and self-care ?  ? ?  ?AM-PAC OT "6 Clicks" Daily Activity     ?Outcome Measure ? ? Help from another person eating meals?: Total ?Help from another person taking care of personal grooming?: A Little ?Help from another person toileting, which includes using toliet, bedpan, or urinal?: Total ?Help from another person bathing (including washing, rinsing, drying)?: A Lot ?Help from another person to put on and taking off regular upper body clothing?: A Lot ?Help from another person to put on and taking off regular lower body clothing?: Total ?6 Click Score: 10 ? ?  ?End of Session Equipment Utilized During  Treatment: Oxygen;Other (comment) (STEDY) ? ?OT Visit Diagnosis: Muscle weakness (generalized) (M62.81);Other abnormalities of gait and mobility (R26.89) ?  ?Activity Tolerance Patient tolerated treatment well ?  ?Patient Left in chair;with call bell/phone within reach ?  ?Nurse Communication Other (comment) ?  ? ?   ? ?Time: 5361-4431 ?OT Time Calculation (min): 23 min ? ?Charges: OT General Charges ?$OT Visit: 1 Visit ?OT Treatments ?$Therapeutic Activity: 8-22 mins ? ?Kasai Beltran OTR/L, MS ?Acute Rehabilitation Department ?Office# [phone] ?Pager# [phone] ? ? ?Feliz Beam Jamaine Quintin ?04/01/2022, 3:45 PM ?

## 2022-04-01 NOTE — Progress Notes (Signed)
Speech Language Pathology Treatment: Dysphagia  ?Patient Details ?Name: Mark Yu ?MRN: [id number] ?DOB: 23-Dec-1956 ?Today's Date: 04/01/2022 ?Time: 2094-7096 ?SLP Time Calculation (min) (ACUTE ONLY): 61 min ? ?Assessment / Plan / Recommendation ?Clinical Impression ? Pt seen for skilled SLP treatment to address dysphagia goals.  SLP showed pt his flouroscopy loops - reviewing his study compared a normal MBS.   PT has had trach changed to cuffless - and is tolerating valve well.  Secretions appear to be better managed as well.   ? ?PO observed including intake of ice chips - with cue to allow ice to melt and swallow - pt performs and immediately coughs post-swallow with expectoration of thin secretions .  He report he is taking in ice - allowing it to melt and orally suctioning the water from it.  Also admits to suctioning saliva.  SLP encouraged him to swallow water from ice chips and his saliva after adequate oral care to decrease disuse muscle atrophy.  He was agreeable to try but reports that he can "feel it separate and go down the wrong way".   Pt denies issues with swallowing PTA but does endorse becoming dyspneic easily since March of 2022 and also reports ongoing weight loss. Educated pt to 3 pillars of aspiration pna risks, of which he has all 3, including compromised immune system, poor dentition and dysphagia/decreased laryngeal integrity.  ? ?Prior MBS 03/27/2022 showed a gross pharyngo-cervical esophageal dysphagia 3/31 and clinically pt is not showing significant improvement.  He is agreeable to a PEG for nutrition and to continue to work with SLP.  He admits that chance for adequate improvement is poor given that he senses the liquids separate and some "goes the wrong way" without ability to clear with cough.  SLP does not recommend to repeat an MBS at this time as it will likely continue to show gross dysphagia and will not change care plan IMO.  Recommend PEG and continue SLP - pt agreeable.  SLP  spoke to RN and secure chatted Dr Tamala Julian with CCM and Dr Lupita Leash, hospitalist with recommendations.    ? ?Will continue to follow pt and initiate RMST to improve motility of suprahyoid musculature to improve pharyngeal motility and laryngeal closure for airway protection with po. .  ?  ?HPI HPI: with medical history of recently diagnosed small cell lung cancer, COPD, anxiety, HTN, and depression. He presented to the ED due to progressive shortness of breath with minimal exertion x2 days. he had been finished a steroid taper shortly before admission and had recently been prescribed an appetite stimulant.     Significant Events:  3/12- admission  3/14- intubation; OGT placement; initial RD assessment; tube feeding initiation   3/20- extubation + re-intubation; OGT removal + replacement  3/22- extubation + re-intubation; OGT removal + replacement  3/24- tracheostomy + diagnostic bronch; OGT removed; small bore NGT placed in R nare (distal gastric per abdominal x-ray) per CCM notes.  Pt has undergone PMSV trials with adequate tolerance and phonation but rapid fatigue and increased amount of secretions present.   MBS indicated to determine if pt may initiate any po. Pt required vent use overnight on 3/30 to 3/31 due to fatigue. ?  ?   ?SLP Plan ? Continue with current plan of care ? ?  ?  ?Recommendations for follow up therapy are one component of a multi-disciplinary discharge planning process, led by the attending physician.  Recommendations may be updated based on patient status, additional functional criteria and insurance  authorization. ?  ? ?Recommendations  ?Medication Administration: Via alternative means  ?   ? Patient may use Passy-Muir Speech Valve: During all waking hours (remove during sleep) ?PMSV Supervision: Intermittent ?MD: Please consider changing trach tube to : Cuffless;Smaller size  ?   ? ? ? ? Oral Care Recommendations: Oral care BID;Patient independent with oral care ?Follow Up Recommendations: SLP at  Long-term acute care hospital ?Assistance recommended at discharge: Frequent or constant Supervision/Assistance ?SLP Visit Diagnosis: Aphonia (R49.1);Dysphagia, pharyngeal phase (R13.13);Dysphagia, pharyngoesophageal phase (R13.14) ?Plan: Continue with current plan of care ? ? ? ? ?  ?  ? ?Kathleen Lime, MS CCC SLP ?Acute Rehab Services ?Office [phone] ?Pager [phone] ? ?Mark Yu ? ?04/01/2022, 5:55 PM ?

## 2022-04-01 NOTE — TOC Progression Note (Signed)
Transition of Care (TOC) - Progression Note  ? ? ?Patient Details  ?Name: Mark Yu ?MRN: [id number] ?Date of Birth: 1956/07/20 ? ?Transition of Care (TOC) CM/SW Contact  ?Tawanna Cooler, RN ?Phone Number: ?04/01/2022, 2:22 PM ? ?Clinical Narrative:    ? ?Insurance denied Ltach auth.   ?Patient on a trach collar, receiving tube feeds via NG tube.   ?PT continues to recommend SNF. ?TOC CM following.  ?

## 2022-04-01 NOTE — Progress Notes (Signed)
Physical Therapy Treatment ?Patient Details ?Name: Mark Yu ?MRN: [id number] ?DOB: 1956-01-20 ?Today's Date: 04/01/2022 ? ? ?History of Present Illness 66 yo male smoker presented to [facility] with dyspnea.   Found to have rhinovirus, pna, COPD exacerbation. had progressive hypoxia and transferred to SDU 3/13 and started on Bipap.  Respiratory status and mental status worsened,  intubated on 3/14, Ext 3/22 and re-intubated, trach 3/24.  PMH: COPD with emphysema and recent diagnosis of extensive stage SCLC on concurrent chemoradiation therapy. ? ?  ?PT Comments  ? ? The patient is quite anxious, stating  that he cannot stand  and step. Mark Yu utilized  for transfer  Patient able to pull self to stand and remained standing as the STEDY was turned to  transfer to recliner.  Patient may be able to progress short distance ambulation next visit. SPO2 remained > 90% on 28% TC.   ?Recommendations for follow up therapy are one component of a multi-disciplinary discharge planning process, led by the attending physician.  Recommendations may be updated based on patient status, additional functional criteria and insurance authorization. ? ?Follow Up Recommendations ? PT at Long-term acute care hospital/ SNF ?  ?  ?Assistance Recommended at Discharge Frequent or constant Supervision/Assistance  ?Patient can return home with the following Help with stairs or ramp for entrance;Assistance with feeding;Assist for transportation;Assistance with cooking/housework;Two people to help with walking and/or transfers;A little help with bathing/dressing/bathroom;Direct supervision/assist for medications management ?  ?Equipment Recommendations ? None recommended by PT  ?  ?Recommendations for Other Services   ? ? ?  ?Precautions / Restrictions Precautions ?Precautions: Fall ?Precaution Comments: T-collar, NG tube, multiple lines and leads/monitor VS  ?  ? ?Mobility ? Bed Mobility ?  ?Bed Mobility: Supine to Sit ?  ?  ?Supine to sit: Min  guard, HOB elevated ?  ?  ?General bed mobility comments: extra  time for lines, no physical assist. ?  ? ?Transfers ?Overall transfer level: Needs assistance ?Equipment used: Ambulation equipment used ?Transfers: Sit to/from Stand ?Sit to Stand: Min assist, +2 safety/equipment ?  ?  ?  ?  ?  ?General transfer comment: patient stood at [facility], remained standing  as  patient rolled to transfer to the recliner. Assisted to sit down  to recliner to low seat. ?Transfer via Lift Equipment: Stedy ? ?Ambulation/Gait ?  ? ?  ?  ?  ?  ?  ?  ? ? ?Stairs ?  ?  ?  ?  ?  ? ? ?Wheelchair Mobility ?  ? ?Modified Rankin (Stroke Patients Only) ?  ? ? ?  ?Balance   ?  ?  ?  ?  ?Standing balance support: Bilateral upper extremity supported, During functional activity, Reliant on assistive device for balance ?  ?  ?  ?  ?  ?  ?  ?  ?  ?  ?  ?  ?  ?  ? ?  ?Cognition Arousal/Alertness: Awake/alert ?Behavior During Therapy: Restless, Anxious ?Overall Cognitive Status: Within Functional Limits for tasks assessed ?  ?  ?  ?  ?  ?  ?  ?  ?  ?  ?  ?  ?  ?  ?  ?  ?General Comments: using PMV ?  ?  ? ?  ?Exercises   ? ?  ?General Comments General comments (skin integrity, edema, etc.): held to stedy bar, remained standing ?  ?  ? ?Pertinent Vitals/Pain Pain Assessment ?Pain Assessment: Faces ?Faces Pain Scale:  No hurt   ? ? ?Home Living   ?  ?  ?  ?  ?  ?  ?  ?  ?  ?   ?  ?Prior Function    ?  ?  ?   ? ?PT Goals (current goals can now be found in the care plan section) Progress towards PT goals: Progressing toward goals ? ?  ?Frequency ? ? ? Min 2X/week ? ? ? ?  ?PT Plan Current plan remains appropriate  ? ? ?Co-evaluation PT/OT/SLP Co-Evaluation/Treatment: Yes ?Reason for Co-Treatment: Complexity of the patient's impairments (multi-system involvement);For patient/therapist safety ?PT goals addressed during session: Mobility/safety with mobility ?OT goals addressed during session: ADL's and self-care ?  ? ?  ?AM-PAC PT "6 Clicks" Mobility    ?Outcome Measure ? Help needed turning from your back to your side while in a flat bed without using bedrails?: A Little ?Help needed moving from lying on your back to sitting on the side of a flat bed without using bedrails?: A Little ?Help needed moving to and from a bed to a chair (including a wheelchair)?: A Lot ?Help needed standing up from a chair using your arms (e.g., wheelchair or bedside chair)?: A Lot ?Help needed to walk in hospital room?: Total ?Help needed climbing 3-5 steps with a railing? : Total ?6 Click Score: 12 ? ?  ?End of Session Equipment Utilized During Treatment: Oxygen;Other (comment) ?Activity Tolerance: Patient tolerated treatment well ?Patient left: in chair;with call bell/phone within reach ?Nurse Communication: Mobility status ?PT Visit Diagnosis: Other abnormalities of gait and mobility (R26.89);Difficulty in walking, not elsewhere classified (R26.2);Muscle weakness (generalized) (M62.81) ?  ? ? ?Time: 8828-0034 ?PT Time Calculation (min) (ACUTE ONLY): 30 min ? ?Charges:  $ ?$Therapeutic Activity: 8-22 mins          ?          ?Mark Yu PT ?Acute Rehabilitation Services ?Pager [phone] ?Office [phone] ? ? ?Mark Yu ?04/01/2022, 3:15 PM ? ?

## 2022-04-01 NOTE — Progress Notes (Signed)
?PROGRESS NOTE ?Mark Yu  QBH:[id number] DOB: 1956-04-20 DOA: 03/08/2022 ?PCP: [name], MD  ? 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 In thE ER, tachypneic to 20s, having severe SOB, SpO2 86% on room air. ?3/12: admitted  for Ae of COPD, CTA  No PE, new infiltrates, Rhinovirus+ **CODE BLUE** in PM after returning from RadTx, walked to bathroom without, found gray and agonal immediately after, bagged and transferred to [facility] on BiPAP ?3/13: Initially improved mentation and ABG on BiPAP but in AM again nearly unresponsive, again tachypneic to 30s, intubated. Had complicated hospital course with respiratory failure persistent w/ hx of COPD now s/p trach 3/24- weaning> on TC. PCCM following for downsizing and perhaps decannulation. And he is on XRT for concurrent lung cancer. Plannign for SELECT/LTACH once XRT is finished (~7 days or so) ?Major events: ?3/12 Admit with dyspnea, AECOPD in setting of rhinovirus  ?3/14 PCCM consulted for evaluation of respiratory failure. Intubated for airway protection  ?3/17 On vent, XRT  ?3/18 tolerating pressure support today ?3/19 not weaning as well today with every episode of bronchospasms ?3/20 PRBC for hgb of 6.6, PSV wean & extubated.  Failed due to global weakness/inability to clear secretions > reintubated.  ?Tracheal aspirate negative normal flora ?3/22 extubated. D/w family and pt trach vs one-way extubation. Pt was non-committal. Was on NIPPV for about 8 hrs then had to be re-intubated ?3/23 vanc and cefepime started. On-going family and pt discussion. He agreed to trach  ?Tracheal aspirate-no growth ?MRSA PCR negative ?03/20/22- TRACH - [name] and [name] ?03/21/22 -status post tracheostomy 40% FiO2 10 L oxygen.  Currently on trach collar.  Overnight was on ventilator.  Also on fentanyl infusion and Precedex infusion.   Nursing reports that he gets very anxious when trying to wean.  Nevertheless he sitting with tracheostomy and typing on the keyboard and asking appropriate questions.  On antibiotics cefepime.  On Jevity tube feeds.  Afebrile since 03/18/2022.  White count is coming down. ?3/27 refused XRT again today ?3/28 stopped cefepime ?3/30 ATC daytime, Required increased oxygen overnight and placed back on vent ?4/1Episode of AF/RVR, started back on amiodarone drip ?  ?  ?Subjective: ?Seen and examined this morning.  Resting comfortably overnight ng tube changed as it was clogged. ?Overnight afebrile, on 5 L TC ?He is going for radiation therapy today, did not go yesterday ? ?Assessment and Plan: ?Principal Problem: ?  Acute respiratory failure with hypoxia (HCC) ?Active Problems: ?  COPD exacerbation ([place]) ?  Tracheostomy dependent ([place]) ?  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  Metastasis to lymph nodes ([place]) ?  Anxiety ?  Anemia associated with chemotherapy ?  Essential hypertension ?  Protein-calorie malnutrition, severe ?  Depression ?  Physical deconditioning ?  PAF (paroxysmal atrial fibrillation) ([place]) ? ?Acute on chronic hypoxic/hypercapnic respiratory failure from rhinovirus pneumonia with COPD exacerbation ?Acute COPD exacerbation with baseline severe COPD with extensive centrilobular and bullous emphysema ?Cigarette smoker ?S/p tracheostomy and on trach collar currently: ?Complicated course in the ICU subsequently needing trach collar.  Doing well on 5 L TC. Pccm following closely downgraded and hopefully eventually decannulate.continue with pulmonary toileting, Brovana/Pulmicortyupleri, p.o. Lasix, antitussives. ? ?Transient A-fib with RVR while in ICU patient back in sinus rhythm with IV amiodarone.  If recurs will need full anticoagulation per ICU recommendation  monitor in telemetry. ? 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  Continue daily treatment.Follow-up heme-onc and radiation oncology recommendation ? ?Anxiety disorder ?Depression: ?Mood is stable. ? ?Anemia associated with chemotherapy ?Anemia of chronic disease: ?Hemoglobin remains stable monitor intermittently ?Recent Labs  ?Lab 03/28/22 ?3500 03/29/22 ?0512  ?HGB 10.1* 9.7*  ?HCT 32.0* 30.5*  ?Essential hypertension: BP controlled on amlodipine. ? ?Protein-calorie malnutrition, severe: ?Continue to augment nutritional status, currently with NG tube feeding.  Encourage p.o.dietitian following. ? ?Physical deconditioning: Continue PT OT,  for his significant generalized weakness ? ?Hyponatremia-resolved  ? ?Goals of care currently full code, patient was seen by palliative care, remains to be seen with multiple comorbidities  ? ?DVT prophylaxis: enoxaparin (LOVENOX) injection 30 mg Start: 03/28/22 1200 ?Code Status:   Code Status: Full Code ?Family Communication: plan of care discussed with patient at bedside. ?Patient status is: Inpatient level of care: Progressive discussed with ICU and okay to change to progressive care. ?Remains inpatient because: Ongoing management of respiratory failure and ongoing need for radiation ?Patient currently not stable ? ?Dispo: The patient is from: Home ?           Anticipated disposition: To be decided, LTAC evaluating-denied by insurance . ? ?Mobility Assessment (last 72 hours)   ? ? Mobility Assessment   ? ? [place] Name 03/30/22 1300 03/30/22 0800  ?  ?  ?  ? Does patient have an order for bedrest or is patient medically unstable -- No - Continue assessment     ? What is the highest level of mobility based on the progressive mobility assessment? Level 3 (Stands with assist) - Balance while standing  and cannot march in place Level 2 ([place]) - Balance while sitting on edge of bed and cannot stand     ? Is the above level different from baseline mobility prior to current illness? -- Yes -  Recommend PT order     ? ?  ?  ? ?  ?  ? ?Objective: ?Vitals last 24 hrs: ?Vitals:  ? 04/01/22 0700 04/01/22 0759 04/01/22 0805 04/01/22 0821  ?BP:    (!) 141/96  ?Pulse:    95  ?Resp:    (!) 23  ?Temp: 97.7 ?F (36.5 ?C)     ?TempSrc: Oral     ?SpO2:  93% 96% 94%  ?Weight:      ?Height:      ? ?Weight change:  ? ?Physical Examination: ?General exam: AA0x3, frail, thin older than stated age, weak appearing. ?HEENT:Oral mucosa moist, Ear/Nose WNL grossly, dentition normal. ?Respiratory system: bilaterally air entry + trach collar in place, no use of accessory muscle ?Cardiovascular system: S1 & S2 +, No JVD,. ?Gastrointestinal system: Abdomen soft,NT,ND, BS+ ?Nervous System:Alert, awake, moving extremities and grossly nonfocal ?Extremities: edema neg,distal peripheral pulses palpable.  ?Skin: No rashes,no icterus. ?MSK: Normal muscle bulk,tone, power ?NGT+ ? ?Medications reviewed:  ?Scheduled Meds: ? amLODipine  2.5 mg Per Tube Daily  ? arformoterol  15 mcg Nebulization BID  ? budesonide (PULMICORT) nebulizer solution  0.5 mg Nebulization BID  ? chlorhexidine  15 mL Mouth Rinse BID  ? Chlorhexidine Gluconate Cloth  6 each Topical Daily  ? cholecalciferol  1,000 Units Per Tube Daily  ? enoxaparin (LOVENOX) injection  30 mg Subcutaneous Daily  ? feeding supplement (PROSource TF)  45 mL Per Tube TID  ? folic acid  1 mg Per Tube Daily  ? free water  100 mL Per Tube Q4H  ? furosemide  40 mg Per Tube Daily  ? guaiFENesin  30 mL Per Tube Q0600  ? lipase/protease/amylase)  20,880 Units Per Tube Once  ? And  ? sodium bicarbonate  650 mg Per Tube Once  ? magnesium oxide  400 mg Per Tube Daily  ? mouth rinse  15 mL Mouth Rinse q12n4p  ? nystatin   Topical BID  ? pantoprazole sodium  40 mg Per Tube Daily  ? polyethylene glycol  17 g Per Tube Daily  ? revefenacin  175 mcg Nebulization Daily  ? sodium chloride flush  10-40 mL Intracatheter Q12H  ? sodium chloride flush  3 mL Intravenous Q12H  ? vitamin B-12  1,000 mcg Per Tube  Daily  ? zinc sulfate  220 mg Per Tube Daily  ?Continuous Infusions: ? feeding supplement (JEVITY 1.5 CAL/FIBER) 1,000 mL (03/31/22 1617)  ? ?Diet Order   ? ?       ?  Diet NPO time specified  Diet effective now

## 2022-04-02 ENCOUNTER — Ambulatory Visit: Payer: Medicare Other

## 2022-04-02 ENCOUNTER — Ambulatory Visit
Admission: RE | Admit: 2022-04-02 | Discharge: 2022-04-02 | Disposition: A | Discharge status: Home / Self Care | Payer: Medicare Other | Source: Ambulatory Visit | Attending: Radiation Oncology | Admitting: Radiation Oncology

## 2022-04-02 DIAGNOSIS — J9601 Acute respiratory failure with hypoxia: Secondary | ICD-10-CM | POA: Diagnosis not present

## 2022-04-02 LAB — GLUCOSE, CAPILLARY
Glucose-Capillary: 120 mg/dL — ABNORMAL HIGH (ref 70–99)
Glucose-Capillary: 84 mg/dL (ref 70–99)

## 2022-04-02 LAB — BASIC METABOLIC PANEL
Anion gap: 6 (ref 5–15)
BUN: 23 mg/dL (ref 8–23)
CO2: 31 mmol/L (ref 22–32)
Calcium: 8.6 mg/dL — ABNORMAL LOW (ref 8.9–10.3)
Chloride: 98 mmol/L (ref 98–111)
Creatinine, Ser: 0.44 mg/dL — ABNORMAL LOW (ref 0.61–1.24)
GFR, Estimated: >60 mL/min (ref >60)
Glucose, Bld: 138 mg/dL — ABNORMAL HIGH (ref 70–99)
Potassium: 3.3 mmol/L — ABNORMAL LOW (ref 3.5–5.1)
Sodium: 135 mmol/L (ref 135–145)

## 2022-04-02 LAB — MAGNESIUM
Magnesium: 2.1 mg/dL (ref 1.7–2.4)
Magnesium: 1.9 mg/dL (ref 1.7–2.4)

## 2022-04-02 LAB — PHOSPHORUS
Phosphorus: 4.5 mg/dL (ref 2.5–4.6)
Phosphorus: 4 mg/dL (ref 2.5–4.6)

## 2022-04-02 MED ORDER — BARIUM SULFATE 2 % PO SUSP
240.0000 mL | Freq: Once | ORAL | Status: AC
  Administered 2022-04-02: 240 mL via NASOGASTRIC

## 2022-04-02 MED ORDER — BARIUM SULFATE 2 % PO SUSP
450.0000 mL | Freq: Once | ORAL | Status: AC
  Administered 2022-04-02: 450 mL via ORAL

## 2022-04-02 NOTE — Progress Notes (Signed)
Patient ID: Mark Yu, male   DOB: 02-09-1956, 66 y.o.   MRN: [id number] ?Pt's G tube placement has been rescheduled for 4/7. Nurse given additional preprocedure orders.  ?

## 2022-04-02 NOTE — Progress Notes (Signed)
Per Odelia Gage, PA-C with Radiology to order another barium sulfate suspension and administer now.  ?

## 2022-04-02 NOTE — Progress Notes (Signed)
?PROGRESS NOTE ?Mark Yu  MLY:[id number] DOB: 02-09-1956 DOA: 03/08/2022 ?PCP: [name], MD  ? 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 In thE ER, tachypneic to 20s, having severe SOB, SpO2 86% on room air. ?3/12: admitted  for Ae of COPD, CTA  No PE, new infiltrates, Rhinovirus+ **CODE BLUE** in PM after returning from RadTx, walked to bathroom without, found gray and agonal immediately after, bagged and transferred to [facility] on BiPAP ?3/13: Initially improved mentation and ABG on BiPAP but in AM again nearly unresponsive, again tachypneic to 30s, intubated. Had complicated hospital course with respiratory failure persistent w/ hx of COPD now s/p trach 3/24- weaning> on TC. PCCM following for downsizing and perhaps decannulation.  Patient is getting daily XRT for concurrent lung cancer which seems to be helping his pulmonary status overall.Evaluated by [facility] but denied by insurance.  With ongoing dysphagia speech recommended PEG tube placement 4/5 ?  ?Major events: ?3/12 Admit with dyspnea, AECOPD in setting of rhinovirus  ?3/14 PCCM consulted for evaluation of respiratory failure. Intubated for airway protection  ?3/17 On vent, XRT  ?3/18 tolerating pressure support today ?3/19 not weaning as well today with every episode of bronchospasms ?3/20 PRBC for hgb of 6.6, PSV wean & extubated.  Failed due to global weakness/inability to clear secretions > reintubated.  ?Tracheal aspirate negative normal flora ?3/22 extubated. D/w family and pt trach vs one-way extubation. Pt was non-committal. Was on NIPPV for about 8 hrs then had to be re-intubated ?3/23 vanc and cefepime started. On-going family and pt discussion. He agreed to trach  ?Tracheal aspirate-no growth ?MRSA PCR negative ?03/20/22- TRACH - [name] and [name] ?03/21/22 -status post tracheostomy 40% FiO2 10 L  oxygen.  Currently on trach collar.  Overnight was on ventilator.  Also on fentanyl infusion and Precedex infusion.  Nursing reports that he gets very anxious when trying to wean.  Nevertheless he sitting with tracheostomy and typing on the keyboard and asking appropriate questions.  On antibiotics cefepime.  On Jevity tube feeds.  Afebrile since 03/18/2022.  White count is coming down. ?3/27 refused XRT again today ?3/28 stopped cefepime ?3/30 ATC daytime, Required increased oxygen overnight and placed back on vent ?4/1Episode of AF/RVR, started back on amiodarone drip ?  ?Subjective: ?Seen and examined.  Alert awake pleasant.  Reports he is getting PEG tube at 3:00. ?Overnight afebrile, BP stable ?Remains on 5 L trach collar. ? ?Assessment and Plan: ?Principal Problem: ?  Acute respiratory failure with hypoxia ([place]) ?Active Problems: ?  COPD exacerbation ([place]) ?  Tracheostomy dependent ([place]) ?  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  Metastasis to lymph nodes ([place]) ?  Anxiety ?  Anemia associated with chemotherapy ?  Essential hypertension ?  Protein-calorie malnutrition, severe ?  Depression ?  Physical deconditioning ?  PAF (paroxysmal atrial fibrillation) ([place]) ? 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 ?Pulmonary managing continue to downsize and hopefully decannulate.  On 5 L trach collar currently.  ?RT following.  Continue Brovana/Pulmicortyupleri, p.o. Lasix, antitussives. ? ?Transient A-fib with  RVR while in ICU patient back in sinus rhythm with IV amiodarone.  If recurs will need full anticoagulation per ICU recommendation  monitor in telemetry. ? 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  Continue daily treatment. Follow-up heme-onc and radiation oncology recommendation ? ?Anxiety disorder ?Depression: ?Mood is stable. ?Anemia associated with chemotherapy ?Anemia of chronic disease: ?Hb stable monitor intermittently ?Recent Labs  ?Lab 03/28/22 ?5170 03/29/22 ?0174 04/01/22 ?1735  ?HGB 10.1* 9.7* 9.5*  ?HCT 32.0* 30.5* 29.8*  ?Essential hypertension: BP controlled on amlodipine. ?Protein-calorie malnutrition, severe: ?Continue to augment nutritional status, currently with NG tube feeding.  Encourage p.o.dietitian following. ?Gross pharyngeal cervical esophageal dysphagia: Speech reevaluated 4/5, at this time recommending PEG tube and IR consulted for PEG. ?Physical deconditioning: Continue PT OT ?Hyponatremia-resolved  ?Goals of care currently full code, patient was seen by palliative care, remains to be seen with multiple comorbidities  ? ?DVT prophylaxis: enoxaparin (LOVENOX) injection 30 mg Start: 03/28/22 1200 ?Code Status:   Code Status: Full Code ?Family Communication: plan of care discussed with patient at bedside. ?Patient status is: Inpatient level of care: Progressive  ?Remains inpatient because: Ongoing management of respiratory failure and ongoing need for radiation ?Patient currently not stable ? ?Dispo: The patient is from: Home ?           Anticipated disposition: To be decided, LTAC evaluating-denied by insurance . ? ?Mobility Assessment (last 72 hours)   ? ? Mobility Assessment   ? ? [place] Name 04/02/22 0800 04/01/22 1538 04/01/22 1512 04/01/22 1448 03/30/22 1300  ? Does patient have an order for bedrest or is patient medically unstable No - Continue assessment -- -- -- --  ? What is the highest level of mobility based on the progressive mobility assessment? Level 3 (Stands with assist) - Balance while  standing  and cannot march in place Level 3 (Stands with assist) - Balance while standing  and cannot march in place Level 4 (Walks with assist in room) - Balance while marching in place and cannot step forward and back - Complete Level 4 (Walks with assist in room) - Balance while marching in place and cannot step forward and back - Complete Level 3 (Stands with assist) - Balance while standing  and cannot march in place  ? Is the above level different from baseline mobility prior to current illness? Yes - Recommend PT order -- -- -- --  ? ?  ?  ? ?  ?  ? ?Objective: ?Vitals last 24 hrs: ?Vitals:  ? 04/02/22 0800 04/02/22 0854 04/02/22 0855 04/02/22 0918  ?BP:    140/76  ?Pulse: 96 95    ?Resp: 19 13    ?Temp:      ?TempSrc:      ?SpO2: 94% 99% 98%   ?Weight:      ?Height:      ? ?Weight change:  ? ?Physical Examination: ?General exam: AA OS ox 3, pleasant, thin frail looking,older than stated age, weak appearing. ?HEENT:Oral mucosa moist, Ear/Nose WNL grossly, dentition normal. ?Respiratory system: bilaterally diminished, TC+ no use of accessory muscle ?Cardiovascular system: S1 & S2 +, No JVD,. ?Gastrointestinal system: Abdomen soft,NT,ND, BS+ ?Nervous System:Alert, awake, moving extremities and grossly nonfocal ?Extremities: edema neg,distal peripheral pulses palpable.  ?Skin: No rashes,no icterus. ?MSK: thin muscle bulk,tone, power ? ? ?Medications reviewed:  ?Scheduled Meds: ? amLODipine  2.5 mg Per Tube  Daily  ? arformoterol  15 mcg Nebulization BID  ? budesonide (PULMICORT) nebulizer solution  0.5 mg Nebulization BID  ? chlorhexidine  15 mL Mouth Rinse BID  ? Chlorhexidine Gluconate Cloth  6 each Topical Daily  ? cholecalciferol  1,000 Units Per Tube Daily  ? enoxaparin (LOVENOX) injection  30 mg Subcutaneous Daily  ? feeding supplement (PROSource TF)  45 mL Per Tube TID  ? folic acid  1 mg Per Tube Daily  ? free water  100 mL Per Tube Q4H  ? furosemide  40 mg Per Tube Daily  ? guaiFENesin  30 mL Per Tube  Q0600  ? lipase/protease/amylase)  20,880 Units Per Tube Once  ? And  ? sodium bicarbonate  650 mg Per Tube Once  ? magnesium oxide  400 mg Per Tube Daily  ? mouth rinse  15 mL Mouth Rinse q12n4p  ? nystatin   Top

## 2022-04-02 NOTE — Progress Notes (Signed)
SLP Cancellation Note ? ?Patient Details ?Name: Mark Yu ?MRN: [id number] ?DOB: 1956-04-11 ? ? ?Cancelled treatment:       Reason Eval/Treat Not Completed: Other (comment) (pt to get PEG today per RN, will continue efforts) and for XRT soon.  ?Kathleen Lime, MS [facility] SLP ?Acute Rehab Services ?Office [phone] ?Pager [phone] ? ? ?[name] ?04/02/2022, 11:02 AM ?

## 2022-04-02 NOTE — Consult Note (Signed)
? ?Chief Complaint: ?Patient was seen in consultation today for gastrostomy placement at the request of Dr Antonieta Pert ? ?Referring Physician(s): ?Antonieta Pert, MD ? ?Supervising Physician: Michaelle Birks ? ?Patient Status: [facility] - In-pt ? ?History of Present Illness: ?Mark Yu is a 66 y.o. 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 In thE ER, tachypneic to 20s, having severe SOB, SpO2 86% on room air. ?3/12: admitted  for Ae of COPD, CTA  No PE, new infiltrates, Rhinovirus+ **CODE BLUE** in PM after returning from RadTx, walked to bathroom without, found gray and agonal immediately after, bagged and transferred to [facility] on BiPAP ?3/13: Initially improved mentation and ABG on BiPAP but in AM again nearly unresponsive, again tachypneic to 30s, intubated. Had complicated hospital course with respiratory failure persistent w/ hx of COPD now s/p trach 3/24- weaning> on TC. PCCM following for downsizing and perhaps decannulation.  Patient is getting daily XRT for concurrent lung cancer which seems to be helping his pulmonary status overall.  Evaluated by [facility] but denied by insurance.  With ongoing dysphagia speech recommended PEG tube placement 4/5. ?IR consulted for placement of gastrostomy tube. ? ?Past Medical History:  ?Diagnosis Date  ? Anxiety   ? tx xanax  ? COPD (chronic obstructive pulmonary disease) ([place])   ? tx inhalers  ? Depression   ? Dyspnea   ? with exertion  ? HTN (hypertension)   ? tx amlodipine  ? Hyponatremia 03/08/2022  ? ? ?Past Surgical History:  ?Procedure Laterality Date  ? BRONCHIAL BIOPSY  02/02/2022  ? Procedure: BRONCHIAL BIOPSIES;  Surgeon: Collene Gobble, MD;  Location: [facility] ENDOSCOPY;  Service: Pulmonary;;  ? BRONCHIAL BRUSHINGS  02/02/2022  ? Procedure: BRONCHIAL BRUSHINGS;  Surgeon: Collene Gobble, MD;  Location: [facility] ENDOSCOPY;  Service: Pulmonary;;  ? BRONCHIAL NEEDLE ASPIRATION BIOPSY   02/02/2022  ? Procedure: BRONCHIAL NEEDLE ASPIRATION BIOPSIES;  Surgeon: Collene Gobble, MD;  Location: [place] ENDOSCOPY;  Service: Pulmonary;;  ? IR IMAGING GUIDED PORT INSERTION  02/24/2022  ? MRI    ? NO PAST SURGERIES    ? VIDEO BRONCHOSCOPY WITH ENDOBRONCHIAL ULTRASOUND N/A 02/02/2022  ? Procedure: VIDEO BRONCHOSCOPY WITH ENDOBRONCHIAL ULTRASOUND;  Surgeon: Collene Gobble, MD;  Location: [facility] ENDOSCOPY;  Service: Pulmonary;  Laterality: N/A;  ? VIDEO BRONCHOSCOPY WITH RADIAL ENDOBRONCHIAL ULTRASOUND  02/02/2022  ? Procedure: VIDEO BRONCHOSCOPY WITH RADIAL ENDOBRONCHIAL ULTRASOUND;  Surgeon: Collene Gobble, MD;  Location: [facility] ENDOSCOPY;  Service: Pulmonary;;  ? ? ?Allergies: ?Amoxicillin ? ?Medications: ?Prior to Admission medications   ?Medication Sig Start Date End Date Taking? Authorizing Provider  ?acetaminophen (TYLENOL) 500 MG tablet Take 500 mg by mouth every 8 (eight) hours as needed for moderate pain or headache.   Yes [provider]  ?albuterol (PROVENTIL) (2.5 MG/3ML) 0.083% nebulizer solution Take 3 mLs (2.5 mg total) by nebulization every 6 (six) hours as needed for wheezing or shortness of breath. 02/20/22  Yes Martyn Ehrich, NP  ?albuterol (VENTOLIN HFA) 108 (90 Base) MCG/ACT inhaler Inhale 2 puffs into the lungs every 4 (four) hours as needed for wheezing or shortness of breath.   Yes [provider]  ?ALPRAZolam Duanne Moron) 1 MG tablet Take 0.5-1 tablets (0.5-1 mg total) by mouth 2 (two) times daily as needed for anxiety. 02/20/22  Yes Martyn Ehrich, NP  ?amLODipine (NORVASC) 5 MG tablet Take 5 mg by mouth daily.  12/28/21  Yes [provider]  ?BREZTRI AEROSPHERE 160-9-4.8 MCG/ACT AERO Inhale 2 puffs into the lungs in the morning and at bedtime. 01/02/22  Yes [provider]  ?Calcium Carb-Cholecalciferol 500-10 MG-MCG TABS Take 1 tablet by mouth daily.   Yes [provider]  ?cholecalciferol (VITAMIN D) 25 MCG (1000 UNIT) tablet Take 1,000 Units by mouth  daily.   Yes [provider]  ?fluticasone (FLONASE) 50 MCG/ACT nasal spray Place 2 sprays into both nostrils daily as needed for allergies or rhinitis.   Yes [provider]  ?folic acid (FOLVITE) 329 MCG tablet Take 400 mcg by mouth daily.   Yes [provider]  ?guaiFENesin (MUCINEX) 600 MG 12 hr tablet Take 600 mg by mouth every evening.   Yes [provider]  ?magnesium oxide (MAG-OX) 400 MG tablet Take 400 mg by mouth daily.   Yes [provider]  ?prochlorperazine (COMPAZINE) 10 MG tablet Take 1 tablet (10 mg total) by mouth every 6 (six) hours as needed for nausea or vomiting. 02/09/22  Yes Curt Bears, MD  ?sucralfate (CARAFATE) 1 g tablet Take 1 tablet (1 g total) by mouth 4 (four) times daily. Dissolve each tablet in 15 cc water before use. 03/06/22  Yes Kyung Rudd, MD  ?vitamin B-12 (CYANOCOBALAMIN) 1000 MCG tablet Take 1,000 mcg by mouth daily.   Yes [provider]  ?lidocaine-prilocaine (EMLA) cream Apply to the Port-A-Cath site 30-60 minutes before chemotherapy 02/09/22   Curt Bears, MD  ?methylPREDNISolone (MEDROL DOSEPAK) 4 MG TBPK tablet Use as instructed. 02/25/22   Curt Bears, MD  ?  ? ?Family History  ?Problem Relation Age of Onset  ? Alzheimer's disease Mother   ? Multiple sclerosis Father   ? ? ?Social History  ? ?Socioeconomic History  ? Marital status: Significant Other  ?  Spouse name: Not on file  ? Number of children: Not on file  ? Years of education: Not on file  ? Highest education level: Not on file  ?Occupational History  ? Not on file  ?Tobacco Use  ? Smoking status: Every Day  ?  Packs/day: 0.25  ?  Years: 50.00  ?  Pack years: 12.50  ?  Types: Cigarettes  ? Smokeless tobacco: Not on file  ? Tobacco comments:  ?  3 cigarettes from Sunday to Wednesday.  1 whole cigarettes today 02/20/2022 hfb  ?Vaping Use  ? Vaping Use: Never used  ?Substance and Sexual Activity  ? Alcohol use: Yes  ?  Alcohol/week: 30.0 standard  drinks  ?  Types: 30 Cans of beer per week  ? Drug use: Yes  ?  Types: Marijuana  ?  Comment: Last use in 03/2021  ? Sexual activity: Yes  ?Other Topics Concern  ? Not on file  ?Social History Narrative  ? Not on file  ? ?Social Determinants of Health  ? ?Financial Resource Strain: Not on file  ?Food Insecurity: Not on file  ?Transportation Needs: Not on file  ?Physical Activity: Not on file  ?Stress: Not on file  ?Social Connections: Not on file  ? ? ?Review of Systems  ?Constitutional:  Positive for activity change and fatigue.  ?Eyes: Negative.   ?Respiratory:  Negative for shortness of breath.   ?Cardiovascular: Negative.   ?Gastrointestinal: Negative.   ?Endocrine: Negative.   ?Genitourinary: Negative.   ?Musculoskeletal: Negative.   ?Allergic/Immunologic: Negative.   ?Neurological: Negative.   ?Hematological: Negative.   ?Psychiatric/Behavioral: Negative.    ? ?Vital Signs: ?BP  140/76   Pulse 95   Temp 97.8 ?F (36.6 ?C) (Oral)   Resp 13   Ht 5\' 11"  (1.803 m)   Wt 86 lb 6.7 oz (39.2 kg)   SpO2 98%   BMI 12.05 kg/m?  ? ?Physical Exam ?Vitals reviewed.  ?Constitutional:   ?   General: He is not in acute distress. ?   Appearance: He is cachectic. He is ill-appearing.  ?HENT:  ?   Head: Normocephalic.  ?   Mouth/Throat:  ?   Pharynx: Oropharynx is clear.  ?   Comments: Trach collar ?Cardiovascular:  ?   Rate and Rhythm: Normal rate.  ?Pulmonary:  ?   Effort: No tachypnea, accessory muscle usage or respiratory distress.  ?   Breath sounds: Examination of the right-lower field reveals decreased breath sounds. Examination of the left-lower field reveals decreased breath sounds. Decreased breath sounds present.  ?   Comments: Trach collar with high flow O2 ?Abdominal:  ?   General: Abdomen is flat.  ?   Palpations: Abdomen is soft.  ?Neurological:  ?   Mental Status: He is alert.  ? ? ?Imaging: ?DG Chest 1 View ? ?Result Date: 03/16/2022 ?CLINICAL DATA:  COPD, difficulty breathing EXAM: CHEST  1 VIEW COMPARISON:   Previous studies including the examination done earlier today FINDINGS: Cardiac size is within normal limits. Low position of diaphragms suggests COPD. There is interval improvement in aeration in the right lower l

## 2022-04-03 ENCOUNTER — Ambulatory Visit: Payer: Medicare Other

## 2022-04-03 ENCOUNTER — Inpatient Hospital Stay (HOSPITAL_COMMUNITY): Payer: Medicare Other

## 2022-04-03 DIAGNOSIS — J9601 Acute respiratory failure with hypoxia: Secondary | ICD-10-CM | POA: Diagnosis not present

## 2022-04-03 HISTORY — PX: IR GASTROSTOMY TUBE MOD SED: IMG625

## 2022-04-03 LAB — PHOSPHORUS
Phosphorus: 4 mg/dL (ref 2.5–4.6)
Phosphorus: 4.1 mg/dL (ref 2.5–4.6)

## 2022-04-03 LAB — BASIC METABOLIC PANEL
Anion gap: 9 (ref 5–15)
BUN: 16 mg/dL (ref 8–23)
CO2: 31 mmol/L (ref 22–32)
Calcium: 8.6 mg/dL — ABNORMAL LOW (ref 8.9–10.3)
Chloride: 96 mmol/L — ABNORMAL LOW (ref 98–111)
Creatinine, Ser: 0.34 mg/dL — ABNORMAL LOW (ref 0.61–1.24)
GFR, Estimated: >60 mL/min (ref >60)
Glucose, Bld: 83 mg/dL (ref 70–99)
Potassium: 3.4 mmol/L — ABNORMAL LOW (ref 3.5–5.1)
Sodium: 136 mmol/L (ref 135–145)

## 2022-04-03 LAB — MAGNESIUM
Magnesium: 1.9 mg/dL (ref 1.7–2.4)
Magnesium: 2 mg/dL (ref 1.7–2.4)

## 2022-04-03 MED ORDER — FENTANYL CITRATE (PF) 100 MCG/2ML IJ SOLN
INTRAMUSCULAR | Status: AC
  Filled 2022-04-03: qty 2

## 2022-04-03 MED ORDER — LIDOCAINE HCL 1 % IJ SOLN
INTRAMUSCULAR | Status: AC
Start: 2022-04-03 — End: 2022-04-03
  Filled 2022-04-03: qty 20

## 2022-04-03 MED ORDER — FENTANYL CITRATE (PF) 100 MCG/2ML IJ SOLN
INTRAMUSCULAR | Status: AC | PRN
  Administered 2022-04-03: 50 ug via INTRAVENOUS

## 2022-04-03 MED ORDER — LIDOCAINE VISCOUS HCL 2 % MT SOLN
OROMUCOSAL | Status: AC
  Filled 2022-04-03: qty 15

## 2022-04-03 MED ORDER — MIDAZOLAM HCL 2 MG/2ML IJ SOLN
INTRAMUSCULAR | Status: AC | PRN
  Administered 2022-04-03: 1 mg via INTRAVENOUS

## 2022-04-03 MED ORDER — JEVITY 1.5 CAL/FIBER PO LIQD
1000.0000 mL | ORAL | Status: DC
  Administered 2022-04-04 – 2022-04-09 (×6): 1000 mL
  Filled 2022-04-03 (×10): qty 1000

## 2022-04-03 MED ORDER — GUAIFENESIN 100 MG/5ML PO LIQD
5.0000 mL | ORAL | Status: DC | PRN
  Administered 2022-04-03 – 2022-04-09 (×15): 5 mL via ORAL
  Filled 2022-04-03 (×16): qty 10

## 2022-04-03 MED ORDER — GLUCAGON HCL RDNA (DIAGNOSTIC) 1 MG IJ SOLR
INTRAMUSCULAR | Status: AC
  Filled 2022-04-03: qty 1

## 2022-04-03 MED ORDER — VANCOMYCIN HCL IN DEXTROSE 1-5 GM/200ML-% IV SOLN
INTRAVENOUS | Status: AC | PRN
Start: 2022-04-03 — End: 2022-04-03
  Administered 2022-04-03: 1000 mg via INTRAVENOUS

## 2022-04-03 MED ORDER — MIDAZOLAM HCL 2 MG/2ML IJ SOLN
INTRAMUSCULAR | Status: AC
  Filled 2022-04-03: qty 2

## 2022-04-03 MED ORDER — FENTANYL CITRATE PF 50 MCG/ML IJ SOSY
12.5000 ug | PREFILLED_SYRINGE | INTRAMUSCULAR | Status: DC | PRN
  Administered 2022-04-07 – 2022-04-15 (×7): 12.5 ug via INTRAVENOUS
  Filled 2022-04-03 (×8): qty 1

## 2022-04-03 MED ORDER — HYDROCODONE-ACETAMINOPHEN 7.5-325 MG/15ML PO SOLN
10.0000 mL | Freq: Four times a day (QID) | ORAL | Status: DC | PRN
  Administered 2022-04-09 – 2022-04-14 (×6): 10 mL via ORAL
  Filled 2022-04-03 (×6): qty 15

## 2022-04-03 MED ORDER — VANCOMYCIN HCL IN DEXTROSE 1-5 GM/200ML-% IV SOLN: 1000.0000 mg | INTRAVENOUS | Status: AC

## 2022-04-03 MED ORDER — GLUCAGON HCL (RDNA) 1 MG IJ SOLR
INTRAMUSCULAR | Status: AC | PRN
  Administered 2022-04-03: .5 mL via INTRAVENOUS

## 2022-04-03 MED ORDER — IOHEXOL 300 MG/ML  SOLN
50.0000 mL | Freq: Once | INTRAMUSCULAR | Status: DC | PRN
Start: 2022-04-03 — End: 2022-04-17

## 2022-04-03 MED ORDER — VANCOMYCIN HCL IN DEXTROSE 1-5 GM/200ML-% IV SOLN
INTRAVENOUS | Status: AC
  Administered 2022-04-03: 5 mg
  Filled 2022-04-03: qty 200

## 2022-04-03 NOTE — Procedures (Signed)
Interventional Radiology Procedure: ? ? ?Indications: Dysphagia and malnutrition  ? ?Procedure: Gastrostomy tube placement ? ?Findings: 24 Fr tube in stomach ? ?Complications: No immediate complications noted. ?    ?EBL: Minimal ? ?Plan: May use tube today for medications and anticipate starting feeds tomorrow. ? ? ?Mark Yu R. Anselm Pancoast, MD  ?Pager: [phone] ? ? ? ?  ?

## 2022-04-03 NOTE — Progress Notes (Signed)
PT Cancellation Note ? ?Patient Details ?Name: Mark Yu ?MRN: [id number] ?DOB: 1956/08/11 ? ? ?Cancelled Treatment:    Reason Eval/Treat Not Completed: Other (comment) ?Pt had PEG placed this am.  Spoke with RN and pt still loopy/confused and unable to participate.  Pt to have radiation in afternoon.  Will f/u as able. ?Abran Richard, PT ?Acute Rehab Services ?Pager [phone] ?[facility] [phone] ? ? ?Mikael Spray Karolee Meloni ?04/03/2022, 11:51 AM ?

## 2022-04-03 NOTE — Progress Notes (Signed)
?PROGRESS NOTE ?Mark Yu  KGY:[id number] DOB: 07/08/1956 DOA: 03/08/2022 ?PCP: [name], MD  ? 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 In thE ER, tachypneic to 20s, having severe SOB, SpO2 86% on room air. ?3/12: admitted  for Ae of COPD, CTA  No PE, new infiltrates, Rhinovirus+ **CODE BLUE** in PM after returning from RadTx, walked to bathroom without, found gray and agonal immediately after, bagged and transferred to [facility] on BiPAP ?3/13: Initially improved mentation and ABG on BiPAP but in AM again nearly unresponsive, again tachypneic to 30s, intubated. Had complicated hospital course with respiratory failure persistent w/ hx of COPD now s/p trach 3/24- weaning> on TC. PCCM following for downsizing and perhaps decannulation.  Patient is getting daily XRT for concurrent lung cancer which seems to be helping his pulmonary status overall.Evaluated by [facility] but denied by insurance.  With ongoing dysphagia speech recommended PEG tube placement 4/5 ?  ?Major events: ?3/12 Admit with dyspnea, AECOPD in setting of rhinovirus  ?3/14 PCCM consulted for evaluation of respiratory failure. Intubated for airway protection  ?3/17 On vent, XRT  ?3/18 tolerating pressure support today ?3/19 not weaning as well today with every episode of bronchospasms ?3/20 PRBC for hgb of 6.6, PSV wean & extubated.  Failed due to global weakness/inability to clear secretions > reintubated.  ?Tracheal aspirate negative normal flora ?3/22 extubated. D/w family and pt trach vs one-way extubation. Pt was non-committal. Was on NIPPV for about 8 hrs then had to be re-intubated ?3/23 vanc and cefepime started. On-going family and pt discussion. He agreed to trach  ?Tracheal aspirate-no growth ?MRSA PCR negative ?03/20/22- TRACH - [name] and [name] ?03/21/22 -status post tracheostomy 40% FiO2 10 L  oxygen.  Currently on trach collar.  Overnight was on ventilator.  Also on fentanyl infusion and Precedex infusion.  Nursing reports that he gets very anxious when trying to wean.  Nevertheless he sitting with tracheostomy and typing on the keyboard and asking appropriate questions.  On antibiotics cefepime.  On Jevity tube feeds.  Afebrile since 03/18/2022.  White count is coming down. ?3/27 refused XRT again today ?3/28 stopped cefepime ?3/30 ATC daytime, Required increased oxygen overnight and placed back on vent ?4/1Episode of AF/RVR, started back on amiodarone drip ?  ?Subjective: ?Seen and examined this morning.  He just came back from PEG tube insertion no other acute events.  Waking up from anesthesia.   ?Overnight afebrile has been on 5 L trach collar ? ?Assessment and Plan: ?Principal Problem: ?  Acute respiratory failure with hypoxia (HCC) ?Active Problems: ?  COPD exacerbation ([place]) ?  Tracheostomy dependent ([place]) ?  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  Metastasis to lymph nodes ([place]) ?  Anxiety ?  Anemia associated with chemotherapy ?  Essential hypertension ?  Protein-calorie malnutrition, severe ?  Depression ?  Physical deconditioning ?  PAF (paroxysmal atrial fibrillation) ([place]) ? 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 ?Pulmonary managing continue to downsize and hopefully decannulate.  On 5 L trach collar currently.  ?RT following.  Continue  Brovana/Pulmicortyupleri, p.o. Lasix, antitussives.  Respiratory status overall remains stable plan unchanged ? ?Transient A-fib with RVR while in ICU patient back in sinus rhythm  with IV amiodarone.  If recurs will need full anticoagulation per ICU recommendation monitor in telemetry. ? 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 ?Continue with daily radiation therapy 1. Follow-up heme-onc and radiation oncology recommendation ? ?Anxiety disorder ?Depression: ?Stable. ? ?Anemia associated with chemotherapy ?Anemia of chronic disease: ?Stable.  Monitor. ?Recent Labs  ?Lab 03/28/22 ?[id number] 03/29/22 ?6759 04/01/22 ?1735  ?HGB 10.1* 9.7* 9.5*  ?HCT 32.0* 30.5* 29.8*  ? ?Essential hypertension: Stable amlodipine. ?Protein-calorie malnutrition, severe: ?Continue to augment nutritional status, hopefully we can start PEG tube feeding tomorrow for additional ? ?Gross pharyngeal cervical esophageal dysphagia: Speech reevaluated 4/5, S/P PEG tube placement  ? ?Physical deconditioning: Continue PT OT ? ?Hyponatremia-resolved  ? ?Goals of care currently full code, patient was seen by palliative care, remains to be seen with multiple comorbidities  ? ?DVT prophylaxis: enoxaparin (LOVENOX) injection 30 mg Start: 03/28/22 1200 ?Code Status:   Code Status: Full Code ?Family Communication: plan of care discussed with patient at bedside. ?Patient status is: Inpatient level of care: Progressive  ?Remains inpatient because: Ongoing management of respiratory failure and ongoing need for radiation ?Patient currently not stable ? ?Dispo: The patient is from: Home ?           Anticipated disposition: To be decided, LTAC evaluating-denied by insurance . ? ?Mobility Assessment (last 72 hours)   ? ? Mobility Assessment   ? ? [place] Name 04/02/22 2000 04/02/22 0800 04/01/22 1538 04/01/22 1512 04/01/22 1448  ? Does patient have an order for bedrest or is patient medically unstable No - Continue assessment No - Continue assessment -- -- --  ? What is the highest level of mobility based on the progressive mobility  assessment? Level 3 (Stands with assist) - Balance while standing  and cannot march in place Level 3 (Stands with assist) - Balance while standing  and cannot march in place Level 3 (Stands with assist) - Balance while standing  and cannot march in place Level 4 (Walks with assist in room) - Balance while marching in place and cannot step forward and back - Complete Level 4 (Walks with assist in room) - Balance while marching in place and cannot step forward and back - Complete  ? Is the above level different from baseline mobility prior to current illness? Yes - Recommend PT order Yes - Recommend PT order -- -- --  ? ?  ?  ? ?  ?  ? ?Objective: ?Vitals last 24 hrs: ?Vitals:  ? 04/03/22 0910 04/03/22 0915 04/03/22 0920 04/03/22 0949  ?BP: (!) 145/85 (!) 148/86 131/81 135/78  ?Pulse:  93 94 88  ?Resp: (!) 23 (!) 23 (!) 22 14  ?Temp:    98.1 ?F (36.7 ?C)  ?TempSrc:    Oral  ?SpO2:  96% 93% 93%  ?Weight:      ?Height:      ? ?Weight change:  ? ?Physical Examination: ?General exam: AA OX3, ill looking,older than stated age, weak appearing. ?HEENT:Oral mucosa moist, Ear/Nose WNL grossly, dentition normal. ?Respiratory system: bilaterally breath sounds present with trach collar in place  ?Cardiovascular system: S1 & S2 +, No JVD,. ?Gastrointestinal system: Abdomen soft,PEG+ WITH ABD BINDER ?Nervous System:Alert, awake, moving extremities and grossly nonfocal ?Extremities:  edema neg,distal peripheral pulses palpable.  ?Skin: No rashes,no icterus. ?MSK: Normal muscle bulk,tone, power ? ? ? ?Medications reviewed:  ?Scheduled Meds: ? amLODipine  2.5 mg Per Tube Daily  ? arformoterol  15 mcg Nebulization BID  ? budesonide (PULMICORT) nebulizer solution  0.5 mg Nebulization BID  ? chlorhexidine  15 mL Mouth Rinse BID  ? Chlorhexidine Gluconate Cloth  6 each Topical Daily  ? cholecalciferol  1,000 Units Per Tube Daily  ? enoxaparin (LOVENOX) injection  30 mg Subcutaneous Daily  ? feeding supplement (PROSource TF)  45 mL Per Tube  TID  ? folic acid  1 mg Per Tube Daily  ? free water  100 mL Per Tube Q4H  ? furosemide  40 mg Per Tube Daily  ? guaiFENesin  30 mL Per Tube Q0600  ? lipase/protease/amylase)  20,880 Units Per Tube Onc

## 2022-04-03 NOTE — Progress Notes (Signed)
Prepared new ATC set up for PT (PT not in room at this time).  ?

## 2022-04-03 NOTE — Progress Notes (Signed)
Nutrition Follow-up ? ?DOCUMENTATION CODES:  ? ?Severe malnutrition in context of chronic illness, Underweight ? ?INTERVENTION:  ?- when able to resume TF: Jevity 1.5 @ 45 ml/hr to advance by 10 ml/hr every 18 hours of run time to reach goal rate of 75 ml/hr x18 hours/day with 45 ml Prosource TF TID and 100 ml free water every 4 hours. ? ? ?NUTRITION DIAGNOSIS:  ? ?Severe Malnutrition related to chronic illness, cancer and cancer related treatments as evidenced by severe fat depletion, severe muscle depletion. -ongoing ? ?GOAL:  ? ?Patient will meet greater than or equal to 90% of their needs -to be met with TF regimen at goal rate.  ? ?MONITOR:  ? ?TF tolerance, Diet advancement, Labs, Weight trends, Skin ? ?ASSESSMENT:  ? ?66 y.o. male with medical history of recently diagnosed small cell lung cancer, COPD, anxiety, HTN, and depression. He presented to the ED due to progressive shortness of breath with minimal exertion x2 days. he had been finished a steroid taper shortly before admission and had recently been prescribed an appetite stimulant. ? ?Significant Events: ?3/12- admission ?3/14- intubation; OGT placement; initial RD assessment; tube feeding initiation  ?3/20- extubation + re-intubation; OGT removal + replacement ?3/22- extubation + re-intubation; OGT removal + replacement ?3/24- tracheostomy + diagnostic bronch; OGT removed; small bore NGT placed in R nare (distal gastric per abdominal x-ray) ?4/4- NGT became clogged and was replaced; transitioned to TF over 18 hours/day ?4/5- TF placed on hold at midnight pending PEG on 4/6 ?4/7- PEG placed by IR ? ? ?Patient returned from IR for PEG placement ~30 minutes prior to RD visit. Patient shares that he is feeling anxious and experiencing sharp pain with coughing; RD alerted RN. Male at bedside shares that staff shared with her that PEG not to be used for feeding for 24 hours.  ? ?Patient was receiving Jevity 1.5 @ 35 ml/hr x18 hours/day (1600-0800) with 45  ml Prosource TF TID and 100 ml free water every 4 hours prior to TF being turned off. ? ?Jevity 1.5 to increase by 10 ml/hr every 18 hours of run time to reach goal rate of 75 ml/hr x18 hours/day with 45 ml Prosource TF TID and 100 ml free water every 4 hours prior to TF being turned off. ? ?At goal rate, this regimen will provide 2185 kcal, 28 grams fiber, 119 grams protein, and 1626 ml free water. ? ?Weight continues to trend down with Lasix order in place. Non-pitting edema to BLE documented in the edema section of flow sheet. ? ? ?Labs reviewed; CBGs: 120 and 84 mg/dl, K: 3.4 mmol/l, Cl: 96 mmol/l, creatinine: 0.34 mg/dl, Ca: 8.6 mg/dl. ? ?Medications reviewed; 1000 units cholecalciferol/day, 1 mg folvite/day, 40 mg lasix per PEG/day, PRN imodium, 400 mg mag-ox per PEG/day, 40 mg protonix per PEG/day, 17 g miralax/day, 1000 mcg cyanocobalamin per PEG/day.  ? ? ?Diet Order:   ?Diet Order   ? ?       ?  Diet NPO time specified Except for: Ice Chips  Diet effective now       ?  ? ?  ?  ? ?  ? ? ?EDUCATION NEEDS:  ? ?No education needs have been identified at this time ? ?Skin:  Skin Assessment: Skin Integrity Issues: ?Skin Integrity Issues:: DTI ?DTI: vertebral column (newly documented on 3/19) ? ?Last BM:  4/5 (type 5 x1, medium amount) ? ?Height:  ? ?Ht Readings from Last 1 Encounters:  ?03/18/22 $RemoveBe'5\' 11"'fcxMvSwBy$  (1.803 m)  ? ? ?  Weight:  ? ?Wt Readings from Last 1 Encounters:  ?03/31/22 39.2 kg  ? ? ? ?BMI:  Body mass index is 12.05 kg/m?. ? ?Estimated Nutritional Needs:  ?Kcal:  2100-2350 kcal ?Protein:  105-115 grams ?Fluid:  >/= 2 L/day ? ? ? ? ?Jarome Matin, MS, RD, LDN ?Registered Dietitian II ?Inpatient Clinical Nutrition ?RD pager # and on-call/weekend pager # available in [place]  ? ?

## 2022-04-03 NOTE — Progress Notes (Signed)
Requested that PT be placed on pulse oximetry- RN agrees.  ?

## 2022-04-03 NOTE — Progress Notes (Signed)
PT denies need for trach suctioning at this time. ?

## 2022-04-04 DIAGNOSIS — J9601 Acute respiratory failure with hypoxia: Secondary | ICD-10-CM | POA: Diagnosis not present

## 2022-04-04 LAB — BASIC METABOLIC PANEL
Anion gap: 12 (ref 5–15)
BUN: 22 mg/dL (ref 8–23)
CO2: 30 mmol/L (ref 22–32)
Calcium: 8.7 mg/dL — ABNORMAL LOW (ref 8.9–10.3)
Chloride: 96 mmol/L — ABNORMAL LOW (ref 98–111)
Creatinine, Ser: 0.49 mg/dL — ABNORMAL LOW (ref 0.61–1.24)
GFR, Estimated: >60 mL/min (ref >60)
Glucose, Bld: 73 mg/dL (ref 70–99)
Potassium: 3.3 mmol/L — ABNORMAL LOW (ref 3.5–5.1)
Sodium: 138 mmol/L (ref 135–145)

## 2022-04-04 LAB — MAGNESIUM
Magnesium: 2 mg/dL (ref 1.7–2.4)
Magnesium: 1.8 mg/dL (ref 1.7–2.4)

## 2022-04-04 LAB — GLUCOSE, CAPILLARY: Glucose-Capillary: 72 mg/dL (ref 70–99)

## 2022-04-04 LAB — PHOSPHORUS
Phosphorus: 4 mg/dL (ref 2.5–4.6)
Phosphorus: 4 mg/dL (ref 2.5–4.6)

## 2022-04-04 MED ORDER — DEXTROSE-NACL 5-0.9 % IV SOLN
INTRAVENOUS | Status: DC
  Administered 2022-04-04: 75 mL via INTRAVENOUS

## 2022-04-04 MED ORDER — IBUPROFEN 200 MG PO TABS
200.0000 mg | ORAL_TABLET | Freq: Four times a day (QID) | ORAL | Status: DC | PRN
  Administered 2022-04-04 – 2022-04-12 (×15): 200 mg via ORAL
  Filled 2022-04-04 (×16): qty 1

## 2022-04-04 MED ORDER — POTASSIUM CHLORIDE 20 MEQ PO PACK
20.0000 meq | PACK | Freq: Once | ORAL | Status: AC
  Administered 2022-04-04: 20 meq
  Filled 2022-04-04: qty 1

## 2022-04-04 NOTE — Progress Notes (Signed)
Kesean Serviss is a 66 y.o. male s/p image guided gastrostomy tube placement with Dr. Anselm Pancoast yesterday.  ? ?G tube site appears clean, dry, no bleeding, no sign of acute infection.  ?Ok to use the gastrostomy tube.  ? ?Please call IR for questions and concerns regarding the G tube.  ? ? ?Armando Gang Dewanna Hurston PA-C ?04/04/2022 10:29 AM ? ? ? ? ? ? ?

## 2022-04-04 NOTE — Progress Notes (Signed)
?PROGRESS NOTE ?Mark Yu  KGM:[id number] DOB: 1956/02/01 DOA: 03/08/2022 ?PCP: [name], MD  ? 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 In thE ER, tachypneic to 20s, having severe SOB, SpO2 86% on room air. ?3/12: admitted  for Ae of COPD, CTA  No PE, new infiltrates, Rhinovirus+ **CODE BLUE** in PM after returning from RadTx, walked to bathroom without, found gray and agonal immediately after, bagged and transferred to [facility] on BiPAP ?3/13: Initially improved mentation and ABG on BiPAP but in AM again nearly unresponsive, again tachypneic to 30s, intubated. Had complicated hospital course with respiratory failure persistent w/ hx of COPD now s/p trach 3/24- weaning> on TC. PCCM following for downsizing and perhaps decannulation.  Patient is getting daily XRT for concurrent lung cancer which seems to be helping his pulmonary status overall.Evaluated by [facility] but denied by insurance.  With ongoing dysphagia speech recommended PEG tube placement 4/5 ?  ?Major events: ?3/12 Admit with dyspnea, AECOPD in setting of rhinovirus  ?3/14 PCCM consulted for evaluation of respiratory failure. Intubated for airway protection  ?3/17 On vent, XRT  ?3/18 tolerating pressure support today ?3/19 not weaning as well today with every episode of bronchospasms ?3/20 PRBC for hgb of 6.6, PSV wean & extubated.  Failed due to global weakness/inability to clear secretions > reintubated.  ?Tracheal aspirate negative normal flora ?3/22 extubated. D/w family and pt trach vs one-way extubation. Pt was non-committal. Was on NIPPV for about 8 hrs then had to be re-intubated ?3/23 vanc and cefepime started. On-going family and pt discussion. He agreed to trach  ?Tracheal aspirate-no growth ?MRSA PCR negative ?03/20/22- TRACH - [name] and [name] ?03/21/22 -status post tracheostomy 40% FiO2 10 L  oxygen.  Currently on trach collar.  Overnight was on ventilator.  Also on fentanyl infusion and Precedex infusion.  Nursing reports that he gets very anxious when trying to wean.  Nevertheless he sitting with tracheostomy and typing on the keyboard and asking appropriate questions.  On antibiotics cefepime.  On Jevity tube feeds.  Afebrile since 03/18/2022.  White count is coming down. ?3/27 refused XRT again today ?3/28 stopped cefepime ?3/30 ATC daytime, Required increased oxygen overnight and placed back on vent ?4/1Episode of AF/RVR, started back on amiodarone drip ?  ?Subjective: ?Seen and examined this morning.  Patient resting comfortably appears weak today blood sugar was in 70s remains n.p.o. until 4 PM today. ?Overnight no hypoxic events doing well on trach collar, no fever.  Blood pressure stable ?Labs this morning with potassium 3.3 stable renal function ?Some pain at the PEG tube site but controlled with Tylenol ?Reports unable to cough much due to pain ? ?Assessment and Plan: ?Principal Problem: ?  Acute respiratory failure with hypoxia (HCC) ?Active Problems: ?  COPD exacerbation ([place]) ?  Tracheostomy dependent ([place]) ?  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  Metastasis to lymph nodes ([place]) ?  Anxiety ?  Anemia associated with chemotherapy ?  Essential hypertension ?  Protein-calorie malnutrition, severe ?  Depression ?  Physical deconditioning ?  PAF (paroxysmal atrial fibrillation) ([place]) ? 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 ?Pulmonary managing continue to downsize and hopefully decannulate.on TC at 5 L-continue  current pulmonary supportive/tolerating-Brovana/Pulmicortyupleri, p.o. Lasix, antitussives.  ? ?Transient A-fib with RVR while in ICU patient back in sinus rhythm with IV amiodarone.  If recurs will need full anticoagulation per ICU recommendation monitor in telemetry. ? 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 ?Continue with daily radiation therapy 1. Follow-up heme-onc and radiation oncology recommendation ? ?Anxiety disorder ?Depression: ?Stable. ? ?Anemia associated with chemotherapy ?Anemia of chronic disease: ?Stable.  Monitor ?Recent Labs  ?Lab 03/29/22 ?[id number] 04/01/22 ?1735  ?HGB 9.7* 9.5*  ?HCT 30.5* 29.8*  ?Essential hypertension: Stable amlodipine. ?Protein-calorie malnutrition, severe: ?Continue to augment nutritional status, had PEG tube placed 4/7 hopefully can start soon for tube feeding once okay with IR-planning for 4 PM today ? ?Low blood sugar in 70s we will put on IV fluids until tissue disease started. ? ?Gross pharyngeal cervical esophageal dysphagia: Speech reevaluated 4/5, S/P PEG- see above ? ?Physical deconditioning: Continue PT OT ?Hyponatremia-resolved  ?Hypokalemia we will replete with potassium  ?Goals of care currently full code, patient was seen by palliative care, remains to be seen with multiple comorbidities  ? ?DVT prophylaxis: enoxaparin (LOVENOX) injection 30 mg Start: 03/28/22 1200 ?Code Status:   Code Status: Full Code ?Family Communication: plan of care discussed with patient at bedside. ?Patient status is: Inpatient level of care: Progressive  ?Remains inpatient because: Ongoing management of respiratory failure and ongoing need for radiation ?Patient currently not stable ? ?Dispo: The patient is from: Home ?           Anticipated disposition: To be decided, LTAC evaluating-denied by insurance . ? ?Mobility Assessment (last 72 hours)   ? ? Mobility Assessment    ? ? [place] Name 04/03/22 2334 04/03/22 1245 04/02/22 2000 04/02/22 0800 04/01/22 1538  ? Does patient have an order for bedrest or is patient medically unstable No - Continue assessment No - Continue assessment No - Continue assessment No - Continue assessment --  ? What is the highest level of mobility based on the progressive mobility assessment? Level 2 ([place]) - Balance while sitting on edge of bed and cannot stand -- Level 3 (Stands with assist) - Balance while standing  and cannot march in place Level 3 (Stands with assist) - Balance while standing  and cannot march in place Level 3 (Stands with assist) - Balance while standing  and cannot march in place  ? Is the above level different from baseline mobility prior to current illness? -- -- Yes - Recommend PT order Yes - Recommend PT order --  ? ? [place] Name 04/01/22 1512 04/01/22 1448  ?  ?  ?  ? What is the highest level of mobility based on the progressive mobility assessment? Level 4 (Walks with assist in room) - Balance while marching in place and cannot step forward and back - Complete Level 4 (Walks with assist in room) - Balance while marching in place and cannot step forward and back - Complete     ? ?  ?  ? ?  ?  ? ?Objective: ?Vitals last 24 hrs: ?Vitals:  ? 04/04/22 0617 04/04/22 0845 04/04/22 1041 04/04/22 1042  ?BP: (!) 132/91  130/81   ?Pulse: 98   (!) 101  ?Resp: 19   (!) 24  ?  Temp: 97.7 ?F (36.5 ?C)   97.8 ?F (36.6 ?C)  ?TempSrc: Oral   Oral  ?SpO2: 96% 93%  91%  ?Weight:      ?Height:      ? ?Weight change:  ? ?Physical Examination: ?General exam: AA OX3, thin, ill looking,older than stated age, weak appearing. ?HEENT:Oral mucosa moist, Ear/Nose WNL grossly, dentition normal. ?Respiratory system: bilaterally diminished, TC in place no use of accessory muscle ?Cardiovascular system: S1 & S2 +, No JVD,. ?Gastrointestinal system: Abdomen soft,NT,ND, BS+ PEG+ mild tenderness ?Nervous System:Alert, awake, moving extremities and grossly  nonfocal ?Extremities: edema neg,distal peripheral pulses palpable.  ?Skin: No rashes,no icterus. ?MSK: Normal muscle bulk,tone, power ? ? ? ? ?Medications reviewed:  ?Scheduled Meds: ? amLODipine  2.5 mg Per Tube Daily

## 2022-04-05 DIAGNOSIS — J9601 Acute respiratory failure with hypoxia: Secondary | ICD-10-CM | POA: Diagnosis not present

## 2022-04-05 LAB — CBC
HCT: 26.5 % — ABNORMAL LOW (ref 39.0–52.0)
Hemoglobin: 8.3 g/dL — ABNORMAL LOW (ref 13.0–17.0)
MCH: 30.2 pg (ref 26.0–34.0)
MCHC: 31.3 g/dL (ref 30.0–36.0)
MCV: 96.4 fL (ref 80.0–100.0)
Platelets: 221 10*3/uL (ref 150–400)
RBC: 2.75 MIL/uL — ABNORMAL LOW (ref 4.22–5.81)
RDW: 16.7 % — ABNORMAL HIGH (ref 11.5–15.5)
WBC: 7.2 10*3/uL (ref 4.0–10.5)
nRBC: 0 % (ref 0.0–0.2)

## 2022-04-05 LAB — BASIC METABOLIC PANEL
Anion gap: 8 (ref 5–15)
BUN: 21 mg/dL (ref 8–23)
CO2: 34 mmol/L — ABNORMAL HIGH (ref 22–32)
Calcium: 8.4 mg/dL — ABNORMAL LOW (ref 8.9–10.3)
Chloride: 94 mmol/L — ABNORMAL LOW (ref 98–111)
Creatinine, Ser: 0.4 mg/dL — ABNORMAL LOW (ref 0.61–1.24)
GFR, Estimated: >60 mL/min (ref >60)
Glucose, Bld: 155 mg/dL — ABNORMAL HIGH (ref 70–99)
Potassium: 3.2 mmol/L — ABNORMAL LOW (ref 3.5–5.1)
Sodium: 136 mmol/L (ref 135–145)

## 2022-04-05 LAB — GLUCOSE, CAPILLARY
Glucose-Capillary: 180 mg/dL — ABNORMAL HIGH (ref 70–99)
Glucose-Capillary: 130 mg/dL — ABNORMAL HIGH (ref 70–99)

## 2022-04-05 MED ORDER — POTASSIUM CHLORIDE 20 MEQ PO PACK
40.0000 meq | PACK | Freq: Once | ORAL | Status: AC
  Administered 2022-04-05: 40 meq
  Filled 2022-04-05: qty 2

## 2022-04-05 NOTE — Progress Notes (Signed)
OT Cancellation Note ? ?Patient Details ?Name: Mark Yu ?MRN: [id number] ?DOB: [birth year] ? ? ?Cancelled Treatment:    Reason Eval/Treat Not Completed: Patient declined, no reason specified. Patient declined saying he needed a day of rest. ? ?Lenward Chancellor ?04/05/2022, 7:15 AM ?

## 2022-04-05 NOTE — Progress Notes (Signed)
?PROGRESS NOTE ?Mark Yu  BTD:[id number] DOB: [birth year] DOA: 03/08/2022 ?PCP: [name], MD  ? 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 In thE ER, tachypneic to 20s, having severe SOB, SpO2 86% on room air. ?3/12: admitted  for Ae of COPD, CTA  No PE, new infiltrates, Rhinovirus+ **CODE BLUE** in PM after returning from RadTx, walked to bathroom without, found gray and agonal immediately after, bagged and transferred to [facility] on BiPAP ?3/13: Initially improved mentation and ABG on BiPAP but in AM again nearly unresponsive, again tachypneic to 30s, intubated. Had complicated hospital course with respiratory failure persistent w/ hx of COPD now s/p trach 3/24- weaning> on TC. PCCM following for downsizing and perhaps decannulation.  Patient is getting daily XRT for concurrent lung cancer which seems to be helping his pulmonary status overall.Evaluated by [facility] but denied by insurance.  With ongoing dysphagia speech recommended PEG tube placement 4/7-PEG tube feeding started 04/04/22 evening ?  ?Major events: ?3/12 Admit with dyspnea, AECOPD in setting of rhinovirus  ?3/14 PCCM consulted for evaluation of respiratory failure. Intubated for airway protection  ?3/17 On vent, XRT  ?3/18 tolerating pressure support today ?3/19 not weaning as well today with every episode of bronchospasms ?3/20 PRBC for hgb of 6.6, PSV wean & extubated.  Failed due to global weakness/inability to clear secretions > reintubated.  ?Tracheal aspirate negative normal flora ?3/22 extubated. D/w family and pt trach vs one-way extubation. Pt was non-committal. Was on NIPPV for about 8 hrs then had to be re-intubated ?3/23 vanc and cefepime started. On-going family and pt discussion. He agreed to trach  ?Tracheal aspirate-no growth ?MRSA PCR negative ?03/20/22- TRACH - [name] and [name] ?03/21/22  -status post tracheostomy 40% FiO2 10 L oxygen.  Currently on trach collar.  Overnight was on ventilator.  Also on fentanyl infusion and Precedex infusion.  Nursing reports that he gets very anxious when trying to wean.  Nevertheless he sitting with tracheostomy and typing on the keyboard and asking appropriate questions.  On antibiotics cefepime.  On Jevity tube feeds.  Afebrile since 03/18/2022.  White count is coming down. ?3/27 refused XRT again today ?3/28 stopped cefepime ?3/30 ATC daytime, Required increased oxygen overnight and placed back on vent ?4/1Episode of AF/RVR, started back on amiodarone drip ?  ?Subjective: ?Seen and examined this morning.  Resting comfortably. ?Abdomen pain better this am ?Overnight afebrile, doing well on trach collar  ?On PEG tube feeding since yesterday evening -tolerating. ?Labs this morning with mild hypokalemia ?Hemoglobin of 9.5> 8.3 g ? ?Assessment and Plan: ?Principal Problem: ?  Acute respiratory failure with hypoxia (HCC) ?Active Problems: ?  COPD exacerbation ([place]) ?  Tracheostomy dependent ([place]) ?  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  Metastasis to lymph nodes ([place]) ?  Anxiety ?  Anemia associated with chemotherapy ?  Essential hypertension ?  Protein-calorie malnutrition, severe ?  Depression ?  Physical deconditioning ?  PAF (paroxysmal atrial fibrillation) ([place]) ? 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 ?Pulmonary managing continue to downsize and hopefully decannulate.on TC  at 5 L-continue current pulmonary supportive/tolerating-Brovana/Pulmicortyupleri, p.o. Lasix, antitussives.   Monitor respiratory status pulmonary following intermittently ? ?Transient A-fib with RVR while in ICU patient back in sinus rhythm with IV amiodarone.  If recurs will need full anticoagulation per ICU recommendation monitor in telemetry. ? 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 Started on 2/21, and need through 04/13/22 per Rad onc team. ?Follow-up heme-onc and radiation oncology recommendation ? ?Anxiety disorder ?Depression: ?Mood is largely stable. ? ?Anemia associated with chemotherapy ?Anemia of chronic disease: ?Hemoglobin slightly downtrending, no obvious bleeding noted.  Monitor ?Recent Labs  ?Lab 04/01/22 ?1735 04/05/22 ?0356  ?HGB 9.5* 8.3*  ?HCT 29.8* 26.5*  ?Essential hypertension: Stable amlodipine. ?Protein-calorie malnutrition, severe: ?Continue to augment nutritional status, had PEG tube placed 4/7-started on PEG tube feeding 4/8 continue as tolerated dietitian managing.   ? ?Low blood sugar in 70s -resolved wean off IV fluid once tolerating PEG tube  feed ? ?Gross pharyngeal cervical esophageal dysphagia: Speech reevaluated 4/5, S/P PEG ? ?Physical deconditioning: Continue PT OT ?Hyponatremia-resolved  ?Hypokalemia -replete again ? ?Goals of care currently full code, patient was seen by palliative care, remains to be seen with multiple comorbidities  ? ?DVT prophylaxis: enoxaparin (LOVENOX) injection 30 mg Start: 03/28/22 1200 ?Code Status:   Code Status: Full Code ?Family Communication: plan of care discussed with patient at bedside. ?Patient status is: Inpatient level of care: Progressive  ?Remains inpatient because: Ongoing management of respiratory failure and ongoing need for radiation ?Patient currently not stable ? ?Dispo: The patient is from: Home ?           Anticipated disposition: To be decided, LTAC evaluating-denied by insurance . ? ?Mobility Assessment (last 72 hours)   ? ? Mobility Assessment   ? ? [place] Name 04/05/22 0730  04/04/22 2110 04/03/22 2334 04/03/22 1245 04/02/22 2000  ? Does patient have an order for bedrest or is patient medically unstable No - Continue assessment No - Continue assessment No - Continue assessment No - Continue assessment No - Continue assessment  ? What is the highest level of mobility based on the progressive mobility assessment? Level 3 (Stands with assist) - Balance while standing  and cannot march in place Level 4 (Walks with assist in room) - Balance while marching in place and cannot step forward and back - Complete Level 2 (Chairfast) - Balance while sitting on edge of bed and cannot stand -- Level 3 (Stands with assist) - Balance while standing  and cannot march in place  ? Is the above level different from baseline mobility prior to current illness? Yes - Recommend PT order Yes - Recommend PT order -- -- Yes - Recommend PT order  ? ?  ?  ? ?  ?  ? ?Objective: ?Vitals last 24 hrs: ?Vitals:  ? 04/04/22 2310 04/05/22 0331 04/05/22 0508 04/05/22 5732  ?BP:   130/76   ?Pulse: (!) 107 94 (!) 102   ?Resp: 18 18 18    ?Temp:   98.5 ?F (36.9 ?C)   ?TempSrc:   Oral   ?SpO2: 93% 95% 92% 92%  ?Weight:      ?Height:      ? ?Weight change:  ? ?Physical Examination: ?General exam:AA0X3,pleasant,,older than stated age, weak appearing. ?HEENT:Oral mucosa moist, Ear/Nose WNL grossly, dentition normal. ?Respiratory system: b/L clear,trach collar in place no use of accessory muscle ?Cardiovascular system: S1 & S2 +, No JVD,. ?Gastrointestinal system: Abdomen soft, PEG+  NT,ND, BS+ ?Nervous System:Alert, awake, moving extremities and grossly nonfocal ?Extremities: edema neg,distal peripheral pulses palpable.  ?Skin: No rashes,no icterus. ?MSK: Normal muscle bulk,tone, power ? ?Medications reviewed:  ?Scheduled Meds: ? amLODipine  2.5 mg Per Tube Daily  ? arformoterol  15 mcg Nebulization BID  ? budesonide (PULMICORT) nebulizer solution  0.5 mg Nebulization BID  ? chlorhexidine  15 mL Mouth Rinse BID  ? Chlorhexidine  Gluconate Cloth  6 each Topical Daily  ? cholecalciferol  1,000 Units Per Tube Daily  ? enoxaparin (LOVENOX) injection  30 mg Subcutaneous Daily  ? feeding supplement (PROSource TF)  45 mL Per Tube TID  ? fo

## 2022-04-06 ENCOUNTER — Ambulatory Visit: Payer: Medicare Other

## 2022-04-06 ENCOUNTER — Other Ambulatory Visit: Payer: Medicare Other

## 2022-04-06 ENCOUNTER — Ambulatory Visit: Payer: Medicare Other | Admitting: Internal Medicine

## 2022-04-06 ENCOUNTER — Ambulatory Visit
Admission: RE | Admit: 2022-04-06 | Discharge: 2022-04-06 | Disposition: A | Discharge status: Home / Self Care | Payer: Medicare Other | Source: Ambulatory Visit | Attending: Radiation Oncology | Admitting: Radiation Oncology

## 2022-04-06 DIAGNOSIS — J9601 Acute respiratory failure with hypoxia: Secondary | ICD-10-CM | POA: Diagnosis not present

## 2022-04-06 LAB — GLUCOSE, CAPILLARY
Glucose-Capillary: 129 mg/dL — ABNORMAL HIGH (ref 70–99)
Glucose-Capillary: 162 mg/dL — ABNORMAL HIGH (ref 70–99)
Glucose-Capillary: 102 mg/dL — ABNORMAL HIGH (ref 70–99)

## 2022-04-06 LAB — BASIC METABOLIC PANEL
Anion gap: 5 (ref 5–15)
BUN: 16 mg/dL (ref 8–23)
CO2: 34 mmol/L — ABNORMAL HIGH (ref 22–32)
Calcium: 8.1 mg/dL — ABNORMAL LOW (ref 8.9–10.3)
Chloride: 97 mmol/L — ABNORMAL LOW (ref 98–111)
Creatinine, Ser: <0.3 mg/dL — ABNORMAL LOW (ref 0.61–1.24)
Glucose, Bld: 161 mg/dL — ABNORMAL HIGH (ref 70–99)
Potassium: 3.4 mmol/L — ABNORMAL LOW (ref 3.5–5.1)
Sodium: 136 mmol/L (ref 135–145)

## 2022-04-06 NOTE — Progress Notes (Signed)
OT Cancellation Note ? ?Patient Details ?Name: Mark Yu ?MRN: [id number] ?DOB: 07-23-1956 ? ? ?Cancelled Treatment:    Reason Eval/Treat Not Completed: Patient declined, no reason specified ?Patient declined to participate at this time stating he needed to take a nap prior to radiation. Patient was educated on importance of participation in therapy. Patient was very set on resting prior to radiation at this time. OT to continue to follow and check back as schedule will allow.  ?Zamya Culhane OTR/L, MS ?Acute Rehabilitation Department ?Office# [phone] ?Pager# [phone] ? ?04/06/2022, 11:01 AM ?

## 2022-04-06 NOTE — Care Management Important Message (Signed)
Important Message ? ?Patient Details IM Letter given to the Patient ?Name: Mark Yu ?MRN: [id number] ?Date of Birth: 1956/11/02 ? ? ?Medicare Important Message Given:  Yes ? ? ? ? ?Kerin Salen ?04/06/2022, 12:41 PM ?

## 2022-04-06 NOTE — TOC Progression Note (Signed)
Transition of Care (TOC) - Progression Note  ? ? ?Patient Details  ?Name: An Schnabel ?MRN: [id number] ?Date of Birth: Jun 02, 1956 ? ?Transition of Care (TOC) CM/SW Contact  ?Tawanna Cooler, RN ?Phone Number: ?04/06/2022, 3:09 PM ? ?Clinical Narrative:    ? ?Insurance denied [facility], cannot go to [facility].   ? ?Possibility that patient can go to a SNF, but the trach must be 36 days old before a SNF will accept.  Patient received his trach on 3/24.   Today is day 17 since trach placed.    ? ?TOC following.  ?

## 2022-04-06 NOTE — Progress Notes (Signed)
?PROGRESS NOTE ?Mark Yu  GUY:[id number] DOB: 01-21-1956 DOA: 03/08/2022 ?PCP: [name], MD  ? 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 In thE ER, tachypneic to 20s, having severe SOB, SpO2 86% on room air. ?3/12: admitted  for Ae of COPD, CTA  No PE, new infiltrates, Rhinovirus+ **CODE BLUE** in PM after returning from RadTx, walked to bathroom without, found gray and agonal immediately after, bagged and transferred to [facility] on BiPAP ?3/13: Initially improved mentation and ABG on BiPAP but in AM again nearly unresponsive, again tachypneic to 30s, intubated. Had complicated hospital course with respiratory failure persistent w/ hx of COPD now s/p trach 3/24- weaning> on TC. PCCM following for downsizing and perhaps decannulation.  Patient is getting daily XRT for concurrent lung cancer which seems to be helping his pulmonary status overall.Evaluated by [facility] but denied by insurance.  With ongoing dysphagia speech recommended PEG tube placement 4/7-PEG tube feeding started 04/04/22 evening ?  ?Major events: ?3/12 Admit with dyspnea, AECOPD in setting of rhinovirus  ?3/14 PCCM consulted for evaluation of respiratory failure. Intubated for airway protection  ?3/17 On vent, XRT  ?3/18 tolerating pressure support today ?3/19 not weaning as well today with every episode of bronchospasms ?3/20 PRBC for hgb of 6.6, PSV wean & extubated.  Failed due to global weakness/inability to clear secretions > reintubated.  ?Tracheal aspirate negative normal flora ?3/22 extubated. D/w family and pt trach vs one-way extubation. Pt was non-committal. Was on NIPPV for about 8 hrs then had to be re-intubated ?3/23 vanc and cefepime started. On-going family and pt discussion. He agreed to trach  ?Tracheal aspirate-no growth ?MRSA PCR negative ?03/20/22- TRACH - [name] and [name] ?03/21/22  -status post tracheostomy 40% FiO2 10 L oxygen.  Currently on trach collar.  Overnight was on ventilator.  Also on fentanyl infusion and Precedex infusion.  Nursing reports that he gets very anxious when trying to wean.  Nevertheless he sitting with tracheostomy and typing on the keyboard and asking appropriate questions.  On antibiotics cefepime.  On Jevity tube feeds.  Afebrile since 03/18/2022.  White count is coming down. ?3/27 refused XRT again today ?3/28 stopped cefepime ?3/30 ATC daytime, Required increased oxygen overnight and placed back on vent ?4/1Episode of AF/RVR, started back on amiodarone drip ?  ?Subjective: ?Seen and examined this morning.  Resting comfortably significant other at the bedside.   ?Wants to try some coffee through the PEG tube  ?Overnight afebrile, doing well on trach collar 5 L ?K improving 3.4 ? ?Assessment and Plan: ?Principal Problem: ?  Acute respiratory failure with hypoxia (HCC) ?Active Problems: ?  COPD exacerbation ([place]) ?  Tracheostomy dependent ([place]) ?  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  Metastasis to lymph nodes ([place]) ?  Anxiety ?  Anemia associated with chemotherapy ?  Essential hypertension ?  Protein-calorie malnutrition, severe ?  Depression ?  Physical deconditioning ?  PAF (paroxysmal atrial fibrillation) ([place]) ? 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 ?Pulmonary managing continue to downsize and hopefully decannulate.on TC  at 5 L-continue current pulmonary supportive/tolerating-Brovana/Pulmicortyupleri, p.o. Lasix, antitussives.  Monitor respiratory status.  NoW  tolerating tube feeding.  ? ?Transient A-fib with RVR while in ICU patient back in sinus rhythm with IV amiodarone.  If recurs will need full anticoagulation per ICU recommendation monitor in telemetry. ? 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 Started on 2/21, and need through 04/13/22 per Rad onc team. ?Follow-up heme-onc and radiation oncology recommendation ? ?Anxiety disorder ?Depression: ?Moderately stable. ? ?Anemia associated with chemotherapy ?Anemia of chronic disease: ?Hemoglobin slightly downtrending, repeat CBC transfuse if less than 7 g.   ?Recent Labs  ?Lab 04/01/22 ?1735 04/05/22 ?0356  ?HGB 9.5* 8.3*  ?HCT 29.8* 26.5*  ? ?Essential hypertension: Controlled on amlodipine. ? ?Protein-calorie malnutrition, severe: ?Continue to augment nutritional status, has a PEG tube placed on 4/17 restarted on tube feeding/8 continue as per dietitian to reach to goal  ? ?Low blood sugar in 70s -resolved, discontinue IV fluids ? ?Gross pharyngeal cervical esophageal dysphagia: Speech reevaluated 4/5, S/P PEG ? ?Physical deconditioning: Continue PT OT ?Hyponatremia-resolved  ?Hypokalemia -replete again ? ?Goals of care currently full code, patient was seen by palliative care, remains to be seen with multiple comorbidities  ? ?DVT prophylaxis: enoxaparin (LOVENOX) injection 30 mg Start: 03/28/22 1200 ?Code Status:   Code Status: Full Code ?Family Communication: plan of care discussed with patient at bedside. ?Patient status is: Inpatient level of care: Progressive  ?Remains inpatient because: Ongoing management of respiratory failure and ongoing need for radiation ?Patient currently not stable ? ?Dispo: The patient is from: Home ?           Anticipated disposition: LTAC has been denied it seems.  Awaiting for completion of radiation therapy.  Continue PT OT for placement.  Messaged TOC. ? ?Mobility Assessment (last 72 hours)   ? ? Mobility Assessment   ? ?  [place] Name 04/05/22 2030 04/05/22 0730 04/04/22 2110 04/03/22 2334 04/03/22 1245  ? Does patient have an order for bedrest or is patient medically unstable No - Continue assessment No - Continue assessment No - Continue assessment No - Continue assessment No - Continue assessment  ? What is the highest level of mobility based on the progressive mobility assessment? Level 3 (Stands with assist) - Balance while standing  and cannot march in place Level 3 (Stands with assist) - Balance while standing  and cannot march in place Level 4 (Walks with assist in room) - Balance while marching in place and cannot step forward and back - Complete Level 2 (Chairfast) - Balance while sitting on edge of bed and cannot stand --  ? Is the above level different from baseline mobility prior to current illness? -- Yes - Recommend PT order Yes - Recommend PT order -- --  ? ?  ?  ? ?  ?  ? ?Objective: ?Vitals last 24 hrs: ?Vitals:  ? 04/06/22 0036 04/06/22 0331 04/06/22 9357 04/06/22 0857  ?BP:   124/74   ?Pulse: 94 98 99   ?Resp: 19  20   ?Temp:   97.9 ?F (36.6 ?C)   ?TempSrc:   Oral   ?SpO2: 97% 94% 92% 95%  ?Weight:      ?Height:      ? ?Weight change:  ? ?Physical Examination: ?General exam: AA OX3, pleasant,older than stated age, weak appearing. ?HEENT:Oral mucosa moist, Ear/Nose WNL grossly, dentition normal. ?Respiratory system: bilaterally clear, trach collar in place,no use of  accessory muscle ?Cardiovascular system: S1 & S2 +, No JVD,. ?Gastrointestinal system: Abdomen soft,NT,ND,PEG+, BS+ ?Nervous System:Alert, awake, moving extremities and grossly nonfocal ?Extremities: edema neg,distal peripheral pulses palpable.  ?Skin: No rashes,no icterus. ?MSK: Normal muscle bulk,tone, power ? ? ?Medications reviewed:  ?Scheduled Meds: ? amLODipine  2.5 mg Per Tube Daily  ? arformoterol  15 mcg Nebulization BID  ? budesonide (PULMICORT) nebulizer solution  0.5 mg Nebulization BID  ? chlorhexidine  15 mL Mouth Rinse BID  ? Chlorhexidine  Gluconate Cloth  6 each Topical Daily  ? cholecalciferol  1,000 Units Per Tube Daily  ? enoxaparin (LOVENOX) injection  30 mg Subcutaneous Daily  ? feeding supplement (PROSource TF)  45 mL Per Tube TID  ? folic a

## 2022-04-06 NOTE — Progress Notes (Signed)
PT Cancellation Note ? ?Patient Details ?Name: Mark Yu ?MRN: [id number] ?DOB: 1956-06-14 ? ? ?Cancelled Treatment:    Reason Eval/Treat Not Completed: Patient declined stating he wanted a nap before radiation and did not want therapy service at that time.  Will check back as schedule permits. ? ?Galen Manila ?04/06/2022, 12:48 PM ?

## 2022-04-06 NOTE — Progress Notes (Signed)
SLP Cancellation Note ? ?Patient Details ?Name: Mark Yu ?MRN: [id number] ?DOB: 1956-05-01 ? ? ?Cancelled treatment:       Reason Eval/Treat Not Completed: Patient at procedure or test/unavailable. SLP will f/u next date. ? ?Sonia Baller, MA, CCC-SLP ?Speech Therapy ? ?

## 2022-04-07 ENCOUNTER — Ambulatory Visit: Payer: Medicare Other

## 2022-04-07 ENCOUNTER — Inpatient Hospital Stay (HOSPITAL_COMMUNITY): Payer: Medicare Other

## 2022-04-07 ENCOUNTER — Ambulatory Visit
Admission: RE | Admit: 2022-04-07 | Discharge: 2022-04-07 | Disposition: A | Discharge status: Home / Self Care | Payer: Medicare Other | Source: Ambulatory Visit | Attending: Radiation Oncology | Admitting: Radiation Oncology

## 2022-04-07 DIAGNOSIS — J9601 Acute respiratory failure with hypoxia: Secondary | ICD-10-CM | POA: Diagnosis not present

## 2022-04-07 DIAGNOSIS — J9600 Acute respiratory failure, unspecified whether with hypoxia or hypercapnia: Secondary | ICD-10-CM | POA: Diagnosis not present

## 2022-04-07 DIAGNOSIS — J189 Pneumonia, unspecified organism: Secondary | ICD-10-CM

## 2022-04-07 LAB — BASIC METABOLIC PANEL
Anion gap: 6 (ref 5–15)
BUN: 17 mg/dL (ref 8–23)
CO2: 33 mmol/L — ABNORMAL HIGH (ref 22–32)
Calcium: 8.2 mg/dL — ABNORMAL LOW (ref 8.9–10.3)
Chloride: 99 mmol/L (ref 98–111)
Creatinine, Ser: <0.3 mg/dL — ABNORMAL LOW (ref 0.61–1.24)
Glucose, Bld: 140 mg/dL — ABNORMAL HIGH (ref 70–99)
Potassium: 3.5 mmol/L (ref 3.5–5.1)
Sodium: 138 mmol/L (ref 135–145)

## 2022-04-07 LAB — CBC
HCT: 25.3 % — ABNORMAL LOW (ref 39.0–52.0)
Hemoglobin: 7.9 g/dL — ABNORMAL LOW (ref 13.0–17.0)
MCH: 30.6 pg (ref 26.0–34.0)
MCHC: 31.2 g/dL (ref 30.0–36.0)
MCV: 98.1 fL (ref 80.0–100.0)
Platelets: 294 10*3/uL (ref 150–400)
RBC: 2.58 MIL/uL — ABNORMAL LOW (ref 4.22–5.81)
RDW: 16.2 % — ABNORMAL HIGH (ref 11.5–15.5)
WBC: 7.3 10*3/uL (ref 4.0–10.5)
nRBC: 0 % (ref 0.0–0.2)

## 2022-04-07 LAB — MRSA NEXT GEN BY PCR, NASAL: MRSA by PCR Next Gen: NOT DETECTED

## 2022-04-07 LAB — GLUCOSE, CAPILLARY
Glucose-Capillary: 153 mg/dL — ABNORMAL HIGH (ref 70–99)
Glucose-Capillary: 122 mg/dL — ABNORMAL HIGH (ref 70–99)

## 2022-04-07 LAB — PROCALCITONIN: Procalcitonin: <0.1 ng/mL

## 2022-04-07 MED ORDER — SODIUM CHLORIDE 0.9 % IV SOLN
2.0000 g | Freq: Two times a day (BID) | INTRAVENOUS | Status: AC
  Administered 2022-04-07 – 2022-04-12 (×10): 2 g via INTRAVENOUS
  Filled 2022-04-07 (×10): qty 12.5

## 2022-04-07 NOTE — Progress Notes (Signed)
PT Cancellation Note ? ?Patient Details ?Name: Mark Yu ?MRN: [id number] ?DOB: 1956/02/23 ? ? ?Cancelled Treatment:    Reason Eval/Treat Not Completed: Medical issues which prohibited therapy, recently required increased oxygen. Will check back another time. ?Tresa Endo PT ?Acute Rehabilitation Services ?Pager [phone] ?Office [phone] ? ? ? ?Mark Yu, Shella Maxim ?04/07/2022, 1:59 PM ?

## 2022-04-07 NOTE — Progress Notes (Signed)
RT had to increase O2 from 28% to 40%. Sats 94% no distress noted at this time. RT suction thick yellow secretion. RN at bedside and MD notified.  ?

## 2022-04-07 NOTE — Progress Notes (Signed)
OT Cancellation Note ? ?Patient Details ?Name: Mark Yu ?MRN: [id number] ?DOB: [birth year] ? ? ?Cancelled Treatment:    Reason Eval/Treat Not Completed: Medical issues which prohibited therapy ?Patient's nurse asking therapy to hold off at this time with patient having recent increase in O2 need. Ot to continue to follow and check back as schedule will allow.  ?Makayli Bracken OTR/L, MS ?Acute Rehabilitation Department ?Office# [phone] ?Pager# [phone] ? ?04/07/2022, 2:00 PM ?

## 2022-04-07 NOTE — TOC Progression Note (Addendum)
Transition of Care (TOC) - Progression Note  ? ? ?Patient Details  ?Name: Mark Yu ?MRN: [id number] ?Date of Birth: 09-26-1956 ? ?Transition of Care (TOC) CM/SW Contact  ?Purcell Mouton, RN ?Phone Number: ?04/07/2022, 4:33 PM ? ?Clinical Narrative:    ?Will need updated PT notes for recommendations for SNF for insurance co. Pt's insurance declined pt, to  go to [facility]. ? ? ?Expected Discharge Plan: Home/Self Care ?Barriers to Discharge: Insurance Authorization ? ?Expected Discharge Plan and Services ?Expected Discharge Plan: Home/Self Care ?  ?Discharge Planning Services: CM Consult ?  ?Living arrangements for the past 2 months: [place] ?                ?  ?  ?  ?  ?  ?  ?  ?  ?  ?  ? ? ?Social Determinants of Health (SDOH) Interventions ?  ? ?Readmission Risk Interventions ? ?  03/09/2022  ? 12:52 PM  ?Readmission Risk Prevention Plan  ?Transportation Screening Complete  ?PCP or Specialist Appt within 3-5 Days Complete  ?[place] or Home Care Consult Complete  ?Social Work Consult for [place] Planning/Counseling Complete  ?Medication Review Press photographer) Complete  ? ? ?

## 2022-04-07 NOTE — Progress Notes (Signed)
?PROGRESS NOTE ?Mark Yu  HWE:[id number] DOB: 1956/12/02 DOA: 03/08/2022 ?PCP: [name], MD  ? 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 In thE ER, tachypneic to 20s, having severe SOB, SpO2 86% on room air. ?3/12: admitted  for Ae of COPD, CTA  No PE, new infiltrates, Rhinovirus+ **CODE BLUE** in PM after returning from RadTx, walked to bathroom without, found gray and agonal immediately after, bagged and transferred to [facility] on BiPAP ?3/13: Initially improved mentation and ABG on BiPAP but in AM again nearly unresponsive, again tachypneic to 30s, intubated. Had complicated hospital course with respiratory failure persistent w/ hx of COPD now s/p trach 3/24- weaning> on TC. PCCM following for downsizing and perhaps decannulation.  Patient is getting daily XRT for concurrent lung cancer which seems to be helping his pulmonary status overall.Evaluated by [facility] but denied by insurance.  With ongoing dysphagia speech recommended PEG tube placement 4/7-PEG tube feeding started 04/04/22 evening ?  ?Major events: ?3/12 Admit with dyspnea, AECOPD in setting of rhinovirus  ?3/14 PCCM consulted for evaluation of respiratory failure. Intubated for airway protection  ?3/17 On vent, XRT  ?3/18 tolerating pressure support today ?3/19 not weaning as well today with every episode of bronchospasms ?3/20 PRBC for hgb of 6.6, PSV wean & extubated.  Failed due to global weakness/inability to clear secretions > reintubated.  ?Tracheal aspirate negative normal flora ?3/22 extubated. D/w family and pt trach vs one-way extubation. Pt was non-committal. Was on NIPPV for about 8 hrs then had to be re-intubated ?3/23 vanc and cefepime started. On-going family and pt discussion. He agreed to trach  ?Tracheal aspirate-no growth ?MRSA PCR negative ?03/20/22- TRACH - [name] and [name] ?03/21/22  -status post tracheostomy 40% FiO2 10 L oxygen.  Currently on trach collar.  Overnight was on ventilator.  Also on fentanyl infusion and Precedex infusion.  Nursing reports that he gets very anxious when trying to wean.  Nevertheless he sitting with tracheostomy and typing on the keyboard and asking appropriate questions.  On antibiotics cefepime.  On Jevity tube feeds.  Afebrile since 03/18/2022.  White count is coming down. ?3/27 refused XRT again today ?3/28 stopped cefepime ?3/30 ATC daytime, Required increased oxygen overnight and placed back on vent ?4/1Episode of AF/RVR, started back on amiodarone drip ?  ?Subjective: ?Seen and examined this morning ?Reports he had whole body ache this morning not feeling better with Tylenol. ?He had some congestion this morning and had some vivid dreams ?Overnight patient has been afebrile doing well on trach collar intermittent tachypnea during early morning. ? ?Assessment and Plan: ?Principal Problem: ?  Acute respiratory failure with hypoxia ([place]) ?Active Problems: ?  COPD exacerbation ([place]) ?  Tracheostomy dependent ([place]) ?  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  Metastasis to lymph nodes ([place]) ?  Anxiety ?  Anemia associated with chemotherapy ?  Essential hypertension ?  Protein-calorie malnutrition, severe ?  Depression ?  Physical deconditioning ?  PAF (paroxysmal atrial fibrillation) ([place]) ? 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 Lasix, antitussives.   ? 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 Started on 2/21, and need through 04/13/22 per Rad onc team. ?Follow-up heme-onc and radiation oncology recommendation ? ?Anemia associated with chemotherapy ?Anemia of chronic disease: ?Hemoglobin slightly downtrending, repeat CBC transfuse if less than 7 g.   ?Recent Labs  ?Lab 04/01/22 ?1735 04/05/22 ?0356  ?HGB 9.5* 8.3*  ?HCT 29.8* 26.5*  ? ?Transient A-fib with RVR s/pIV amiodarone, resolved in ICU-If recurs will need full anticoagulation per ICU recommendation monitor in telemetry. ?Essential hypertension: Controlled on amlodipine. ?Anxiety disorder/ Depression:Mood is stable. ? ?Protein-calorie malnutrition, severe: ?Continue to augment nutritional status, has a PEG tube placed on 4/17 restarted on tube feeding/8 continue as per dietitian to reach to goal  ? ?Low blood sugar in 70s -resolved, discontinue IV fluids ? ?Gross pharyngeal cervical esophageal dysphagia: Speech reevaluated 4/5, S/P PEG ? ?Physical deconditioning: Continue PT OT ?Hyponatremia-resolved  ?Hypokalemia -replete again ? ?Goals of care currently full code, patient was seen by palliative care, remains to be seen with multiple comorbidities  ? ?DVT prophylaxis: enoxaparin (LOVENOX) injection 30 mg Start: 03/28/22 1200 ?Code Status:   Code Status: Full Code ?Family Communication: plan of care discussed with patient at bedside. ?Patient status is: Inpatient level of care: Progressive  ?Remains inpatient because: Ongoing management of respiratory failure and ongoing need for radiation ?Patient currently not stable ? ?Dispo: The patient is from: Home ?           Anticipated disposition: LTAC has been denied it seems.  Awaiting for completion of radiation therapy.  Continue PT OT for placement.  Messaged TOC. ? ?Mobility Assessment (last 72 hours)   ? ?  Mobility Assessment   ? ? [place] Name 04/06/22 2035 04/06/22 0830 04/05/22 2030 04/05/22 0730 04/04/22 2110  ? Does patient have an order for bedrest or is patient medically unstable No - Continue assessment No - Continue assessment No - Continue assessment No - Continue assessment No - Continue assessment  ? What is the highest level of mobility based on the progressive mobility assessment? Level 2 ([place]) - Balance while sitting on edge of bed and cannot stand Level 2 (Chairfast) - Balance while sitting on edge of bed and cannot stand Level 3 (Stands with assist) - Balance while standing  and cannot march in place Level 3 (Stands with assist) - Balance while standing  and cannot march in place Level 4 (Walks with assist in room) - Balance while marching in place and cannot step forward and back - Complete  ? Is the above level different from baseline mobility prior to current illness? Yes - Recommend PT order Yes - Recommend PT order -- Yes - Recommend PT order Yes - Recommend PT order  ? ?  ?  ? ?  ?  ? ?Objective: ?Vitals last 24 hrs: ?Vitals:  ? 04/07/22 0400 04/07/22 0500 04/07/22 0506 04/07/22 0836  ?BP:   129/72   ?Pulse:   98   ?Resp: (!) 32 (!) 32 19   ?Temp:   98.2 ?F (36.8 ?C)   ?TempSrc:   Oral   ?SpO2:   98% 99%  ?Weight:      ?Height:      ? ?Weight change:  ? ?Physical Examination: ?General exam: AAOX3, older than stated age, weak appearing. ?HEENT:Oral  mucosa moist, Ear/Nose WNL grossly, dentition normal. ?Respiratory system: bilaterally clear,TC +, no use of accessory muscle ?Cardiovascular system: S1 & S2 +, No JVD,. ?Gastrointestinal system: Abdomen soft,NT,ND, BS+, PEG tube in place ?Nervous System:Alert, awake, moving extremities and grossly nonfocal ?Extremities: edema neg,distal peripheral pulses palpable.  ?Skin: No rashes,no icterus. ?MSK: Normal muscle bulk,tone, power ? ? ?Medications reviewed:  ?Scheduled Meds: ? amLODipine  2.5 mg Per Tube Daily  ? arformoterol  15 mcg Nebulization BID   ? budesonide (PULMICORT) nebulizer solution  0.5 mg Nebulization BID  ? chlorhexidine  15 mL Mouth Rinse BID  ? Chlorhexidine Gluconate Cloth  6 each Topical Daily  ? cholecalciferol  1,000 Units Per Flossie Buffy

## 2022-04-07 NOTE — Progress Notes (Signed)
Pharmacy Antibiotic Note ? ?Mark Yu is a 66 y.o. male admitted on 03/08/2022 with AeCOPD in the setting of rhinovirus.  Pharmacy has been consulted for Cefepime dosing for HAP. ? ?Plan: ?Cefepime 2g IV q12h - estimated CrCl ~50 ml/min rounding SCr 0.8 ?Follow up renal function & cultures ? ?Height: 5\' 11"  (180.3 cm) ?Weight: 39.2 kg (86 lb 6.7 oz) ?IBW/kg (Calculated) : 75.3 ? ?Temp (24hrs), Avg:98.2 ?F (36.8 ?C), Min:98.2 ?F (36.8 ?C), Max:98.2 ?F (36.8 ?C) ? ?Recent Labs  ?Lab 04/01/22 ?1735 04/02/22 ?1749 04/03/22 ?0410 04/04/22 ?4496 04/05/22 ?7591 04/06/22 ?6384  ?WBC 8.9  --   --   --  7.2  --   ?CREATININE  --  0.44* 0.34* 0.49* 0.40* <0.30*  ?  ?CrCl cannot be calculated (This lab value cannot be used to calculate CrCl because it is not a number: <0.30).   ? ?Allergies  ?Allergen Reactions  ? Amoxicillin Rash  ? ? ?Antimicrobials this admission: ?3/13 azithromycin  >>  3/17 ?3/23 cefepime >> 3/27 ?3/23 vancomycin >> 3/24 ?4/11 Cefepime >> ? ?Dose adjustments this admission: ? ?Microbiology results:  ?3/12 Resp panel: rhinovirus positive  ?3/20 Resp cx: normal flora ?3/23 Resp cx: normal flora ?3/23 MRSA PCR: not detected ?4/11 MRSA PCR: ? ?Thank you for allowing pharmacy to be a part of this patient?s care. ? ?Peggyann Juba, PharmD, BCPS ?Pharmacy: [phone] ?04/07/2022 2:00 PM ? ?

## 2022-04-07 NOTE — Progress Notes (Signed)
SLP Cancellation Note ? ?Patient Details ?Name: Mark Yu ?MRN: [id number] ?DOB: [birth year] ? ? ?Cancelled treatment:       Reason Eval/Treat Not Completed: Other (comment);Medical issues which prohibited therapy (pt with increased oxygen needs and CXR shows new RUL pna, will continue efforts; recommend only few ice chips after oral care given current medical issue) ? ?Kathleen Lime, MS CCC SLP ?Acute Rehab Services ?Office [phone] ?Pager [phone] ? ? ?Mark Yu ?04/07/2022, 3:54 PM ?

## 2022-04-07 NOTE — Progress Notes (Signed)
? ?NAME:  Mark Yu, MRN:  [id number], DOB:  1956-12-25, LOS: 44 ?ADMISSION DATE:  03/08/2022, CONSULTATION DATE:  3/14 ?REFERRING MD:  [place], CHIEF COMPLAINT:  Dyspnea  ? ?History of Present Illness:  ?66 y/o male presented with limited stage small cell lung cancer and COPD admitted with acute on chronic respiratory failure with hypoxemia due to rhinovirus requiring intubation on 3/14.  He eventually required tracheostomy  ? ?Pertinent  Medical History  ?Severe COPD with emphysema, Depression, Anxiety, HTN, extensive stage SCLC ?Concurrent systemic chemotherapy and radiation treatment ?Rhinovirus infection ? ?Significant Hospital Events: ?Including procedures, antibiotic start and stop dates in addition to other pertinent events   ?3/12 Admit with dyspnea, AECOPD in setting of rhinovirus  ?3/14 PCCM consulted for evaluation of respiratory failure. Intubated for airway protection  ?3/17 On vent, XRT  ?3/18 tolerating pressure support today ?3/19 not weaning as well today with every episode of bronchospasms ?3/20 PRBC for hgb of 6.6, PSV wean & extubated.  Failed due to global weakness/inability to clear secretions > reintubated.  ?Tracheal aspirate negative normal flora ?3/22 extubated. D/w family and pt trach vs one-way extubation. Pt was non-committal. Was on NIPPV for about 8 hrs then had to be re-intubated ?3/23 vanc and cefepime started. On-going family and pt discussion. He agreed to trach  ?Tracheal aspirate-no growth ?MRSA PCR negative ?03/20/22- TRACH - [name] and [name] ?03/21/22 -status post tracheostomy 40% FiO2 10 L oxygen.  Currently on trach collar.  Overnight was on ventilator.  Also on fentanyl infusion and Precedex infusion.  Nursing reports that he gets very anxious when trying to wean.  Nevertheless he sitting with tracheostomy and typing on the keyboard and asking appropriate questions.  On antibiotics cefepime.  On Jevity tube feeds.  Afebrile since 03/18/2022.  White count is coming  down. ?3/27 refused XRT again today ?3/28 stopped cefepime ?3/30 ATC daytime, Required increased oxygen overnight and placed back on vent ?4/1Episode of AF/RVR, started back on amiodarone drip ?4/11 PCCM following on floor for trach, episode of hypoxia today, has RUL PNA, started on Cefepime ? ?Interim History / Subjective:  ?Pt had an episode of hypoxia after radiation today, sats improved on 10L 40% ?Pt states he feels achy, like someone "beat him up" and that he cannot take a deep enough breath to cough up sputum ? ?Objective   ?Blood pressure 129/72, pulse 98, temperature 98.2 ?F (36.8 ?C), temperature source Oral, resp. rate 19, height 5\' 11"  (1.803 m), weight 39.2 kg, SpO2 94 %. ?   ?FiO2 (%):  [28 %-40 %] 40 %  ? ?Intake/Output Summary (Last 24 hours) at 04/07/2022 1353 ?Last data filed at 04/07/2022 1249 ?Gross per 24 hour  ?Intake --  ?Output 1450 ml  ?Net -1450 ml  ? ? ?Filed Weights  ? 03/29/22 0500 03/30/22 0500 03/31/22 0500  ?Weight: 41.2 kg 41 kg 39.2 kg  ? ? ?General:  cachectic M, resting in bed with occasional coughing in no acute distress ?HEENT: MM pink/moist, trach in place ?Neuro: awake, conversational and oriented ?CV: s1s2 rrr, no m/r/g ?PULM:  no significant wheezing or rhonchi, no distress on 10L 40% trach collar, occasional productive coughing  ?GI: soft, bsx4 active  ?Extremities: warm/dry, no edema, minimal muscle bulk ?Skin: no rashes or lesions ? ? ?Resolved Hospital Problem list   ?Aspiration pneumonia ? ?Assessment & Plan:  ?Principal Problem: ?  Acute respiratory failure with hypoxia (HCC) ?Active Problems: ?  COPD exacerbation ([place]) ?  Anxiety ?  Small  cell carcinoma of upper lobe of left lung ([place]) ?  Anemia associated with chemotherapy ?  Essential hypertension ?  Metastasis to lymph nodes ([place]) ?  Protein-calorie malnutrition, severe ?  Depression ?  Physical deconditioning ?  Tracheostomy dependent ([place]) ?  PAF (paroxysmal atrial fibrillation) ([place]) ? ?Acute on chronic  hypoxemic/hypercarbic respiratory failure from rhinovirus pneumonia with COPD exacerbation  ?Cigarette smoker ?Baseline severe COPD with extensive centrilobular and bullous emphysema  ?Tracheostomy status with some bleeding, resolved ?Pneumonia, RUL  ?-RUL PNA on CXR today, had an episode of desaturation after radiation, on 10L 40% ?-start 5 day course of Cefepime ?-currently has 6-0 uncuffed, using PMV, consider capping trial after several days of abx for acute RUL PNA ?-Continue tracheobronchial toilet due to copious secretions, too much to consider downsize at present ?-if progresses to successful capping trial pt would be a candidate for decannulation  ?-Continue brovana/pulmicort/yupelri ?-Continue PO lasix ?-Guaifenesin per his request ? ?Transient atrial fibrillation RVR ?-no recent recurrence ? -reverted to sinus rhythm with IV amiodarone ?- If recurs, needs full AC ?- Telemetry, keep K/Mg WNL ? ?Limited stage small cell lung cancer ?- Continue XRT , started 2/21, has 5 more treatments ?- F/U heme/onc recommendations> resume outpatient visits and infusions in 3-4 weeks ? ?Depression and anxiety ?- Xanax per his request ? ?Protein calorie malnutrition, severe ?-Continue tube feeds ?-s/p PEG 4/7 ? ?Anemia of chronic illness ?-Monitor for bleeding ?-Transfuse PRBC for Hgb < 7 gm/dL ? ?Hypertension ?-Amlodipine ? ?Per SW notes, insurance denied LTACH, can go to SNF, but trach must be 37 days old ? ? ?Best Practice (right click and "Reselect all SmartList Selections" daily)  ? ?Diet/type: tubefeeds ?DVT prophylaxis: subQ heparin ?GI prophylaxis: PPI ?Lines: N/A ?Foley:  N/A ?Code Status:  full code ?Last date of multidisciplinary goals of care discussion [3/24 with [facility];  he wants to continue with his XRT and pursue LTACH, GOC now per primary team] ? ? ? ?Otilio Carpen Diania Co, PA-C ?[facility] ?See Amion for pager ?If no response to pager , please call [phone] until 7pm ?After 7:00 pm call  Elink  [phone] ? ?

## 2022-04-07 NOTE — Plan of Care (Signed)

## 2022-04-08 ENCOUNTER — Ambulatory Visit: Payer: Medicare Other

## 2022-04-08 ENCOUNTER — Ambulatory Visit
Admission: RE | Admit: 2022-04-08 | Discharge: 2022-04-08 | Disposition: A | Discharge status: Home / Self Care | Payer: Medicare Other | Source: Ambulatory Visit | Attending: Radiation Oncology | Admitting: Radiation Oncology

## 2022-04-08 DIAGNOSIS — I480 Paroxysmal atrial fibrillation: Secondary | ICD-10-CM

## 2022-04-08 DIAGNOSIS — F419 Anxiety disorder, unspecified: Secondary | ICD-10-CM | POA: Diagnosis not present

## 2022-04-08 DIAGNOSIS — J9601 Acute respiratory failure with hypoxia: Secondary | ICD-10-CM | POA: Diagnosis not present

## 2022-04-08 DIAGNOSIS — D6481 Anemia due to antineoplastic chemotherapy: Secondary | ICD-10-CM | POA: Diagnosis not present

## 2022-04-08 DIAGNOSIS — J441 Chronic obstructive pulmonary disease with (acute) exacerbation: Secondary | ICD-10-CM | POA: Diagnosis not present

## 2022-04-08 DIAGNOSIS — J189 Pneumonia, unspecified organism: Secondary | ICD-10-CM

## 2022-04-08 LAB — PROCALCITONIN: Procalcitonin: <0.1 ng/mL

## 2022-04-08 LAB — GLUCOSE, CAPILLARY
Glucose-Capillary: 138 mg/dL — ABNORMAL HIGH (ref 70–99)
Glucose-Capillary: 142 mg/dL — ABNORMAL HIGH (ref 70–99)
Glucose-Capillary: 130 mg/dL — ABNORMAL HIGH (ref 70–99)

## 2022-04-08 NOTE — Progress Notes (Signed)
Occupational Therapy Treatment ?Patient Details ?Name: Mark Yu ?MRN: [id number] ?DOB: October 14, 1956 ?Today's Date: 04/08/2022 ? ? ?History of present illness 66 yo male smoker presented to [facility] with dyspnea.   Found to have rhinovirus, pna, COPD exacerbation. had progressive hypoxia and transferred to SDU 3/13 and started on Bipap.  Respiratory status and mental status worsened,  intubated on 3/14, Ext 3/22 and re-intubated, trach 3/24.  PMH: COPD with emphysema and recent diagnosis of extensive stage SCLC on concurrent chemoradiation therapy. ?  ?OT comments ? Patient min guard to transfer to side of the bed. He was total assist to don socks and set up to wash face at edge of bed. Patient was able to sit edge of bed approx 8 minutes to work on activity tolerance but reports pain in back from radiation table. PT in room to assist with transfers to [facility]. Overall total of mod assist to power up and +2 assistance to take steps to [employer] and back. Patient total assist with toileting but able to stand approximately 30 seconds while being cleaned. Patient currently on  8 L 40% trach collar and o2 sats maintained in high 90s. Patient tolerated treatment well and affect seemed brighter at end of session. Cont POC.    ? ?Recommendations for follow up therapy are one component of a multi-disciplinary discharge planning process, led by the attending physician.  Recommendations may be updated based on patient status, additional functional criteria and insurance authorization. ?   ?Follow Up Recommendations ? Skilled nursing-short term rehab (<3 hours/day)  ?  ?Assistance Recommended at Discharge Frequent or constant Supervision/Assistance  ?Patient can return home with the following ? A lot of help with walking and/or transfers;A lot of help with bathing/dressing/bathroom;Assistance with cooking/housework;Assist for transportation;Help with stairs or ramp for entrance ?  ?Equipment Recommendations ? Other (comment) (Defer to next  veue)  ?  ?Recommendations for Other Services   ? ?  ?Precautions / Restrictions Precautions ?Precautions: Fall ?Precaution Comments: T-collar, PEG,, multiple lines and leads/monitor VS, 8 L TV 40% ?Restrictions ?Weight Bearing Restrictions: No  ? ? ?  ? ?Mobility Bed Mobility ?  ?  ?  ?  ?  ?  ?  ?  ?  ? ?Transfers ?  ?  ?  ?  ?  ?  ?  ?  ?  ?  ?  ?  ?Balance Overall balance assessment: Needs assistance ?Sitting-balance support: Feet supported ?Sitting balance-Leahy Scale: Good ?  ?  ?Standing balance support: Bilateral upper extremity supported, During functional activity, Reliant on assistive device for balance ?Standing balance-Leahy Scale: Poor ?Standing balance comment: reliant on external assistance ?  ?  ?  ?  ?  ?  ?  ?  ?  ?  ?  ?   ? ?ADL either performed or assessed with clinical judgement  ? ?ADL Overall ADL's : Needs assistance/impaired ?Eating/Feeding: NPO ?  ?Grooming: Wash/dry face;Sitting ?Grooming Details (indicate cue type and reason): Patietn washed face sitting at edge of bed ?  ?  ?  ?  ?  ?  ?Lower Body Dressing: Total assistance ?Lower Body Dressing Details (indicate cue type and reason): to don socks ?Toilet Transfer: Moderate assistance;+2 for physical assistance;BSC/3in1 ?Toilet Transfer Details (indicate cue type and reason): transferred to [facility] with +2 assistance - to power up, steady and manage all lines ?Toileting- Clothing Manipulation and Hygiene: Total assistance;Sit to/from stand ?Toileting - Clothing Manipulation Details (indicate cue type and reason): total assist for perianal care -  patient able to maintain stand for approx 30 seconds while being cleaned ?  ?  ?Functional mobility during ADLs: Moderate assistance;+2 for physical assistance ?  ?  ? ?Extremity/Trunk Assessment   ?  ?  ?  ?  ?  ? ?Vision Patient Visual Report: No change from baseline ?  ?  ?Perception   ?  ?Praxis   ?  ? ?Cognition Arousal/Alertness: Awake/alert ?Behavior During Therapy: [facility] for tasks  assessed/performed ?Overall Cognitive Status: Within Functional Limits for tasks assessed ?  ?  ?  ?  ?  ?  ?  ?  ?  ?  ?  ?  ?  ?  ?  ?  ?  ?  ?  ?   ?Exercises   ? ?  ?Shoulder Instructions   ? ? ?  ?General Comments    ? ? ?Pertinent Vitals/ Pain       Pain Assessment ?Pain Assessment: Faces ?Faces Pain Scale: Hurts little more ?Pain Location: midback ?Pain Descriptors / Indicators: Aching ?Pain Intervention(s): Monitored during session, Premedicated before session ? ?Home Living   ?  ?  ?  ?  ?  ?  ?  ?  ?  ?  ?  ?  ?  ?  ?  ?  ?  ?  ? ?  ?Prior Functioning/Environment    ?  ?  ?  ?   ? ?Frequency ? Min 2X/week  ? ? ? ? ?  ?Progress Toward Goals ? ?OT Goals(current goals can now be found in the care plan section) ? Progress towards OT goals: Progressing toward goals ? ?Acute Rehab OT Goals ?Patient Stated Goal: Get out of the hospital ?OT Goal Formulation: With patient ?Time For Goal Achievement: 04/22/22 ?Potential to Achieve Goals: Fair  ?Plan Discharge plan needs to be updated   ? ?Co-evaluation ? ? ? PT/OT/SLP Co-Evaluation/Treatment: Yes ?Reason for Co-Treatment: Complexity of the patient's impairments (multi-system involvement);For patient/therapist safety ?PT goals addressed during session: Mobility/safety with mobility ?OT goals addressed during session: ADL's and self-care (functional mobility, activity tolerance) ?  ? ?  ?AM-PAC OT "6 Clicks" Daily Activity     ?Outcome Measure ? ? Help from another person eating meals?: Total (NPO) ?Help from another person taking care of personal grooming?: A Little ?Help from another person toileting, which includes using toliet, bedpan, or urinal?: Total ?Help from another person bathing (including washing, rinsing, drying)?: A Lot ?Help from another person to put on and taking off regular upper body clothing?: A Lot ?Help from another person to put on and taking off regular lower body clothing?: Total ?6 Click Score: 10 ? ?  ?End of Session Equipment Utilized  During Treatment: Oxygen ? ?OT Visit Diagnosis: Muscle weakness (generalized) (M62.81);Other abnormalities of gait and mobility (R26.89) ?  ?Activity Tolerance Patient tolerated treatment well ?  ?Patient Left in bed;with call bell/phone within reach;with nursing/sitter in room ?  ?Nurse Communication   ?  ? ?   ? ?Time: 0488-8916 ?OT Time Calculation (min): 39 min ? ?Charges: OT General Charges ?$OT Visit: 1 Visit ?OT Treatments ?$Self Care/Home Management : 8-22 mins ? ?Verena Shawgo, OTR/L ?Acute Care Rehab Services  ?Office [phone] ?Pager: [phone]  ? ?Endya Austin L Yuette Putnam ?04/08/2022, 4:35 PM ?

## 2022-04-08 NOTE — Progress Notes (Signed)
Physical Therapy Treatment ?Patient Details ?Name: Mark Yu ?MRN: [id number] ?DOB: 1956-10-31 ?Today's Date: 04/08/2022 ? ? ?History of Present Illness 66 yo male smoker presented to [facility] with dyspnea.   Found to have rhinovirus, pna, COPD exacerbation. had progressive hypoxia and transferred to SDU 3/13 and started on Bipap.  Respiratory status and mental status worsened,  intubated on 3/14, Ext 3/22 and re-intubated, trach 3/24.  PMH: COPD with emphysema and recent diagnosis of extensive stage SCLC on concurrent chemoradiation therapy. ? ?  ?PT Comments  ? ? The  patient  is calm this visit and able to participate in mobilioty to and from [facility]. SPO2 on 40% TC > 955. HR 110-120's. Continue progressive mobility as tolerated.    ?Recommendations for follow up therapy are one component of a multi-disciplinary discharge planning process, led by the attending physician.  Recommendations may be updated based on patient status, additional functional criteria and insurance authorization. ? ?Follow Up Recommendations ? PT at Long-term acute care hospital ?  ?  ?Assistance Recommended at Discharge Frequent or constant Supervision/Assistance  ?Patient can return home with the following Help with stairs or ramp for entrance;Assistance with feeding;Assist for transportation;Assistance with cooking/housework;Two people to help with walking and/or transfers;A little help with bathing/dressing/bathroom;Direct supervision/assist for medications management ?  ?Equipment Recommendations ? None recommended by PT  ?  ?Recommendations for Other Services   ? ? ?  ?Precautions / Restrictions Precautions ?Precaution Comments: T-collar, PEG,, multiple lines and leads/monitor VS  ?  ? ?Mobility ? Bed Mobility ?  ?Bed Mobility: Sit to Supine ?  ?  ?  ?  ?  ?General bed mobility comments: light assistance to lift legs onto bed ?  ? ?Transfers ?Overall transfer level: Needs assistance ?  ?Transfers: Sit to/from Stand, Bed to  chair/wheelchair/BSC ?Sit to Stand: Mod assist, +2 safety/equipment ?  ?Step pivot transfers: Mod assist, +2 physical assistance, +2 safety/equipment ?  ?  ?  ?General transfer comment: aSSISTANCE TO POWER UP TO PARTIALLY STAND, PIVOT STEPS TO  BSC, OD ASSIST FROM bsc, PIVOT STEPS TO BED.cues to reach for rail. stood and supported on bed rail for pericare. ?  ? ?Ambulation/Gait ?  ?  ?  ?  ?  ?  ?  ?  ? ? ?Stairs ?  ?  ?  ?  ?  ? ? ?Wheelchair Mobility ?  ? ?Modified Rankin (Stroke Patients Only) ?  ? ? ?  ?Balance   ?Sitting-balance support: Feet supported ?Sitting balance-Leahy Scale: Good ?  ?  ?Standing balance support: Bilateral upper extremity supported, During functional activity, Reliant on assistive device for balance ?Standing balance-Leahy Scale: Poor ?  ?  ?  ?  ?  ?  ?  ?  ?  ?  ?  ?  ?  ? ?  ?Cognition Arousal/Alertness: Awake/alert ?  ?  ?  ?  ?  ?  ?  ?  ?  ?  ?  ?  ?  ?  ?  ?  ?  ?  ?General Comments: patient was agreeable to participation in session. ?  ?  ? ?  ?Exercises   ? ?  ?General Comments   ?  ?  ? ?Pertinent Vitals/Pain Pain Assessment ?Pain Assessment: No/denies pain  ? ? ?Home Living   ?  ?  ?  ?  ?  ?  ?  ?  ?  ?   ?  ?Prior Function    ?  ?  ?   ? ?  PT Goals (current goals can now be found in the care plan section) Progress towards PT goals: Progressing toward goals ? ?  ?Frequency ? ? ? Min 2X/week ? ? ? ?  ?PT Plan Current plan remains appropriate  ? ? ?Co-evaluation PT/OT/SLP Co-Evaluation/Treatment: Yes ?Reason for Co-Treatment: Complexity of the patient's impairments (multi-system involvement);For patient/therapist safety ?PT goals addressed during session: Mobility/safety with mobility ?OT goals addressed during session: ADL's and self-care ?  ? ?  ?AM-PAC PT "6 Clicks" Mobility   ?Outcome Measure ? Help needed turning from your back to your side while in a flat bed without using bedrails?: A Little ?Help needed moving from lying on your back to sitting on the side of a flat bed  without using bedrails?: A Little ?Help needed moving to and from a bed to a chair (including a wheelchair)?: A Lot ?Help needed standing up from a chair using your arms (e.g., wheelchair or bedside chair)?: A Lot ?Help needed to walk in hospital room?: Total ?Help needed climbing 3-5 steps with a railing? : Total ?6 Click Score: 12 ? ?  ?End of Session Equipment Utilized During Treatment: Oxygen ?Activity Tolerance: Patient tolerated treatment well ?Patient left: in bed;with call bell/phone within reach;with bed alarm set ?Nurse Communication: Mobility status ?PT Visit Diagnosis: Other abnormalities of gait and mobility (R26.89);Difficulty in walking, not elsewhere classified (R26.2);Muscle weakness (generalized) (M62.81) ?  ? ? ?Time: 0165-5374 ?PT Time Calculation (min) (ACUTE ONLY): 25 min ? ?Charges:  $Therapeutic Activity: 8-22 mins          ?          ? ?Tresa Endo PT ?Acute Rehabilitation Services ?Pager [phone] ?Office [phone] ? ? ? ?Jakaiden Fill, Shella Maxim ?04/08/2022, 3:53 PM ? ?

## 2022-04-08 NOTE — Progress Notes (Signed)
? ?PROGRESS NOTE ? ? ? ?Mark Yu  QVZ:[id number] DOB: Jun 24, 1956 DOA: 03/08/2022 ?PCP: [name], MD ? ? ?Brief Narrative: ?89 yom w/ COPD not on home O2, HTN, and metastatic SCLC dx/d Feb 2023 on cisplatin/etop who presented with SOB, wheezing for 3 days, got severe. In thE ER, tachypneic to 20s, having severe SOB, SpO2 86% on room air. ?3/12: admitted  for Ae of COPD, CTA  No PE, new infiltrates, Rhinovirus+ **CODE BLUE** in PM after returning from RadTx, walked to bathroom without, found gray and agonal immediately after, bagged and transferred to [facility] on BiPAP ?3/13: Initially improved mentation and ABG on BiPAP but in AM again nearly unresponsive, again tachypneic to 30s, intubated. Had complicated hospital course with respiratory failure persistent w/ hx of COPD now s/p trach 3/24- weaning> on TC. PCCM following for downsizing and perhaps decannulation.  Patient is getting daily XRT for concurrent lung cancer which seems to be helping his pulmonary status overall.Evaluated by [facility] but denied by insurance.  With ongoing dysphagia speech recommended PEG tube placement 4/7-PEG tube feeding started 04/04/22 evening ? ? ?Assessment and Plan: ? ?Acute on chronic respiratory failure with hypoxia and hypercapnia ?COPD exacerbation ?Centrilobular and bullous emphysema ?Positive for rhinovirus, exacerbating COPD. Patient required ICU admission with intubation on 3/14 and subsequent tracheostomy on 3/24. Currently stable on trach collar with consideration to attempt decannulation prior to discharge. ?-Continue oxygen ?-Continue Brovana and Brovana ?-Continue albuterol PRN ? ?Right upper lobe pneumonia ?Newly diagnosed on 4/11 chest x-ray. Started on empiric Cefepime. Negative MRSA swab. Pulmonology recommendations for 5 days of treatment ?-Continue Cefepime IV ? 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 Medical oncology with plans for  outpatient follow-up. ? ?Anemia of chronic disease ?Also associated with chemotherapy. Baseline hemoglobin around 9. Drift down to 7.9. ?-CBC in AM ? ?Transient atrial fibrillation with RVR ?Patient received amiodarone with resolution of atrial fibrillation. No anticoagulation started. ? ?Primary hypertension ?-Continue amlodipine ? ?Anxiety ?Depression ?-Continue Xanax PRN ? ?Severe protein-calorie malnutrition ?Patient is s/p PEG tube placed on 4/7 ? ?Gross pharyngo-cervical esophageal dysphagia ?Evaluated by speech therapy with recommendations for alternative nutrition. Patient is s/p PEG tube as mentioned above. ? ?Hyponatremia ?Resolved ? ?Hypokalemia ?Resolved. ? ?Pressure injury ?Medial vertebral column. Not present on admission. ? ? ?DVT prophylaxis: Lovenox ?Code Status:   Code Status: Full Code ?Family Communication: Fiance at bedside ?Disposition Plan: Discharge to SNF likely in 5 days pending possible tracheostomy decannulation and completion of IV antibiotics for pneumonia ? ? ?Consultants:  ?PCCM ?Radiation oncology ? ?Procedures:  ?Intubation/extubation ?Tracheostomy ?PEG tube placement ? ?Antimicrobials: ?Vancomycin ?Azithromycin ?Cefepime ? ? ?Subjective: ?Patient reports no new issues overnight. ? ?Objective: ?BP 126/77 (BP Location: Right Arm)   Pulse (!) 106   Temp 98.5 ?F (36.9 ?C) (Oral)   Resp 18   Ht 5\' 11"  (1.803 m)   Wt 39.2 kg   SpO2 97%   BMI 12.05 kg/m?  ? ?Examination: ? ?General exam: Appears calm and comfortable. Cachectic appearing ?Respiratory system: Transmitted upper airway rhonchorus sounds. Respiratory effort normal. ?Cardiovascular system: S1 & S2 heard, RRR. ?Gastrointestinal system: Abdomen is nondistended, soft and nontender. Normal bowel sounds heard. ?Central nervous system: Alert and oriented. No focal neurological deficits. ?Musculoskeletal: No edema. No calf tenderness ?Skin: No cyanosis. ?Psychiatry: Judgement and insight appear normal. Mood & affect  appropriate.  ? ? ?Data Reviewed: I have personally reviewed following labs and imaging studies ? ?CBC ?Lab Results  ?  Component Value Date  ? WBC 7.3 04/07/2022  ? RBC 2.58 (L) 04/07/2022  ? HGB 7.9 (L) 04/07/2022  ? HCT 25.3 (L) 04/07/2022  ? MCV 98.1 04/07/2022  ? MCH 30.6 04/07/2022  ? PLT 294 04/07/2022  ? MCHC 31.2 04/07/2022  ? RDW 16.2 (H) 04/07/2022  ? LYMPHSABS 0.4 (L) 03/25/2022  ? MONOABS 0.9 03/25/2022  ? EOSABS 0.0 03/25/2022  ? BASOSABS 0.0 03/25/2022  ? ? ? ?Last metabolic panel ?Lab Results  ?Component Value Date  ? NA 138 04/07/2022  ? K 3.5 04/07/2022  ? CL 99 04/07/2022  ? CO2 33 (H) 04/07/2022  ? BUN 17 04/07/2022  ? CREATININE <0.30 (L) 04/07/2022  ? GLUCOSE 140 (H) 04/07/2022  ? GFRNONAA NOT CALCULATED 04/07/2022  ? CALCIUM 8.2 (L) 04/07/2022  ? PHOS 4.0 04/04/2022  ? PROT 7.4 03/27/2022  ? ALBUMIN 2.9 (L) 03/27/2022  ? BILITOT 0.2 (L) 03/27/2022  ? ALKPHOS 69 03/27/2022  ? AST 17 03/27/2022  ? ALT 20 03/27/2022  ? ANIONGAP 6 04/07/2022  ? ? ?GFR: ?CrCl cannot be calculated (This lab value cannot be used to calculate CrCl because it is not a number: <0.30). ? ?Recent Results (from the past 240 hour(s))  ?MRSA Next Gen by PCR, Nasal     Status: None  ? Collection Time: 04/07/22  3:48 PM  ? Specimen: Nasal Mucosa; Nasal Swab  ?Result Value Ref Range Status  ? MRSA by PCR Next Gen NOT DETECTED NOT DETECTED Final  ?  Comment: (NOTE) ?The GeneXpert MRSA Assay (FDA approved for NASAL specimens only), ?is one component of a comprehensive MRSA colonization surveillance ?program. It is not intended to diagnose MRSA infection nor to guide ?or monitor treatment for MRSA infections. ?Test performance is not FDA approved in patients less than 2 years ?old. ?Performed at [facility], [address] ?[place], [place] [address] ?  ?  ? ? ?Radiology Studies: ?DG Chest Port 1 View ? ?Result Date: 04/07/2022 ?CLINICAL DATA:  Shortness of breath. EXAM: PORTABLE CHEST 1 VIEW COMPARISON:   One-view chest x-ray 03/28/2022 FINDINGS: Heart size is normal. Right IJ Port-A-Cath is accessed and stable. Tracheostomy tube is in place. Emphysematous changes again noted. New right upper lobe pneumonia present. IMPRESSION: 1. New right upper lobe pneumonia. 2. Emphysema. Electronically Signed   By: San Morelle M.D.   On: 04/07/2022 13:32   ? ? ? LOS: 30 days  ? ? ?Cordelia Poche, MD ?[facility] ?04/08/2022, 6:52 PM ? ? ?If 7PM-7AM, please contact night-coverage ?[email or web address] ? ?

## 2022-04-08 NOTE — Progress Notes (Signed)
30 Day Passar Note ? ?RE:  [id number]       ?Date of Birth: __08-May-1957   ?Date:  04December 31, 2023     ? ?To Whom It May Concern: ? ?Please be advised that the above-named patient will require a short-term nursing home stay - anticipated 30 days or less for rehabilitation and strengthening.  The plan is for return home. ? ? ?Purcell Mouton, RN, CM ?[phone] ?  ?

## 2022-04-08 NOTE — [place] FL2 (Signed)
? MEDICAID FL2 LEVEL OF CARE SCREENING TOOL  ?  ? ?IDENTIFICATION  ?Patient Name: ?Mark Yu Birthdate: Aug 20, 1956 Sex: male Admission Date (Current Location): ?03/08/2022  ?[place] and [place] Number: ? [place] ?  Facility and Address:  ?[facility],  [address] [place], [place] ?     Provider Number: ?[id number]  ?Attending Physician Name and Address:  ?Mariel Aloe, MD ? Relative Name and Phone Number:  ?Yolonda Kida   [phone],[id number],DGLOV Sister   [phone] ?   ?Current Level of Care: ?Hospital Recommended Level of Care: ?[place] Prior Approval Number: ?  ? ?Date Approved/Denied: ?  PASRR Number: ?  ? ?Discharge Plan: ?SNF ?  ? ?Current Diagnoses: ?Patient Active Problem List  ? Diagnosis Date Noted  ? Pneumonia of right upper lobe due to infectious organism   ? 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 PAF (paroxysmal atrial fibrillation) ([place]) 03/31/2022  ? Depression   ? Physical deconditioning   ? Protein-calorie malnutrition, severe 03/10/2022  ? 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 Essential hypertension 03/09/2022  ? Metastasis to lymph nodes ([place]) 03/09/2022  ? Respiratory failure ([place]) 03/08/2022  ? Port-A-Cath in place 02/25/2022  ? 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 Encounter for antineoplastic chemotherapy 02/09/2022  ? Hilar adenopathy   ? COPD exacerbation ([place]) 01/22/2022  ? Tobacco use 01/22/2022  ? Anxiety 01/22/2022  ? Pulmonary nodules 01/08/2022  ? ? ?Orientation RESPIRATION BLADDER Height & Weight   ?  ?Self, Time, Situation, Place ? Tracheostomy (8L at 20%) Incontinent Weight: 39.2 kg ?Height:  5\' 11"  (180.3 cm)  ?BEHAVIORAL SYMPTOMS/MOOD NEUROLOGICAL BOWEL NUTRITION STATUS  ?    Continent Diet (NPO)  ?AMBULATORY STATUS COMMUNICATION OF NEEDS Skin   ?Extensive Assist Verbally Surgical wounds (Tracheostomy) ?  ?  ?  ?    ?     ?     ? ? ?Personal Care Assistance Level of  Assistance  ?Bathing, Feeding, Dressing Bathing Assistance: Maximum assistance ?Feeding assistance: Maximum assistance ?Dressing Assistance: Maximum assistance ?   ? ?Functional Limitations Info  ?Sight, Hearing, Speech Sight Info: Adequate ?Hearing Info: Adequate ?Speech Info: Adequate  ? ? ?SPECIAL CARE FACTORS FREQUENCY  ?PT (By licensed PT), OT (By licensed OT)   ?  ?PT Frequency: x5 week ?OT Frequency: x5 week ?  ?  ?  ?   ? ? ?Contractures Contractures Info: Not present  ? ? ?Additional Factors Info  ?Code Status, Allergies Code Status Info: FULL ?Allergies Info: Amoxicillin ?  ?  ?  ?   ? ?Current Medications (04/08/2022):  This is the current hospital active medication list ?Current Facility-Administered Medications  ?Medication Dose Route Frequency Provider Last Rate Last Admin  ? acetaminophen (TYLENOL) tablet 650 mg  650 mg Per Tube Q6H PRN Noe Gens L, NP   650 mg at 04/08/22 1517  ? Or  ? acetaminophen (TYLENOL) suppository 650 mg  650 mg Rectal Q6H PRN Alfredo Martinez, Brandi L, NP      ? albuterol (PROVENTIL) (2.5 MG/3ML) 0.083% nebulizer solution 2.5 mg  2.5 mg Nebulization Q2H PRN Marcelyn Bruins, MD   2.5 mg at 03/27/22 8416  ? ALPRAZolam Duanne Moron) tablet 0.5-1 mg  0.5-1 mg Oral Q4H PRN Candee Furbish, MD   1 mg at 04/08/22 1518  ? amLODipine (NORVASC) tablet 2.5 mg  2.5 mg Per Tube Daily Erick Colace, NP   2.5 mg at 04/08/22 6063  ?  arformoterol (BROVANA) nebulizer solution 15 mcg  15 mcg Nebulization BID Edwin Dada, MD   15 mcg at 04/08/22 0900  ? budesonide (PULMICORT) nebulizer solution 0.5 mg  0.5 mg Nebulization BID Ollis, Brandi L, NP   0.5 mg at 04/08/22 0900  ? ceFEPIme (MAXIPIME) 2 g in sodium chloride 0.9 % 100 mL IVPB  2 g Intravenous Q12H Emiliano Dyer, RPH 200 mL/hr at 04/08/22 1459 2 g at 04/08/22 1459  ? chlorhexidine (PERIDEX) 0.12 % solution 15 mL  15 mL Mouth Rinse BID Candee Furbish, MD   15 mL at 04/08/22 0825  ? Chlorhexidine Gluconate Cloth 2 % PADS 6 each   6 each Topical Daily Danford, Suann Larry, MD   6 each at 04/07/22 2215  ? cholecalciferol (VITAMIN D3) tablet 1,000 Units  1,000 Units Per Tube Daily Ander Slade, Adewale A, MD   1,000 Units at 04/08/22 0821  ? enoxaparin (LOVENOX) injection 30 mg  30 mg Subcutaneous Daily Rigoberto Noel, MD   30 mg at 04/08/22 4742  ? feeding supplement (JEVITY 1.5 CAL/FIBER) liquid 1,000 mL  1,000 mL Per Tube Continuous Antonieta Pert, MD 75 mL/hr at 04/08/22 1223 1,000 mL at 04/08/22 1223  ? feeding supplement (PROSource TF) liquid 45 mL  45 mL Per Tube TID Antonieta Pert, MD   45 mL at 04/08/22 1518  ? fentaNYL (SUBLIMAZE) injection 12.5 mcg  12.5 mcg Intravenous Q2H PRN Antonieta Pert, MD   12.5 mcg at 04/08/22 1212  ? folic acid (FOLVITE) tablet 1 mg  1 mg Per Tube Daily Ollis, Brandi L, NP   1 mg at 04/08/22 5956  ? free water 100 mL  100 mL Per Tube Q4H Kc, Ramesh, MD   100 mL at 04/08/22 1522  ? furosemide (LASIX) tablet 40 mg  40 mg Per Tube Daily Rigoberto Noel, MD   40 mg at 04/08/22 3875  ? guaiFENesin (ROBITUSSIN) 100 MG/5ML liquid 30 mL  30 mL Per Tube Q0600 Candee Furbish, MD   30 mL at 04/08/22 0820  ? guaiFENesin (ROBITUSSIN) 100 MG/5ML liquid 5 mL  5 mL Oral Q4H PRN Antonieta Pert, MD   5 mL at 04/08/22 1517  ? HYDROcodone-acetaminophen (HYCET) 7.5-325 mg/15 ml solution 10 mL  10 mL Oral Q6H PRN Kc, Ramesh, MD      ? ibuprofen (ADVIL) tablet 200 mg  200 mg Oral Q6H PRN Antonieta Pert, MD   200 mg at 04/08/22 0516  ? iohexol (OMNIPAQUE) 300 MG/ML solution 50 mL  50 mL Per Tube Once PRN Markus Daft, MD      ? lipase/protease/amylase) Rosann Auerbach) tablets 20,880 Units  20,880 Units Per Tube Once Kc, Maren Beach, MD      ? And  ? sodium bicarbonate tablet 650 mg  650 mg Per Tube Once Kc, Maren Beach, MD      ? loperamide HCl (IMODIUM) 1 MG/7.5ML suspension 2 mg  2 mg Per Tube PRN Simonne Maffucci B, MD   2 mg at 03/23/22 1718  ? magnesium oxide (MAG-OX) tablet 400 mg  400 mg Per Tube Daily Noe Gens L, NP   400 mg at 04/08/22 6433  ? MEDLINE mouth  rinse  15 mL Mouth Rinse q12n4p Candee Furbish, MD   15 mL at 04/08/22 1148  ? nystatin (MYCOSTATIN/NYSTOP) topical powder   Topical BID Donita Brooks, NP   Given at 04/08/22 0827  ? pantoprazole sodium (PROTONIX) 40 mg/20 mL oral suspension 40 mg  40  mg Per Tube Daily Chesley Mires, MD   40 mg at 04/08/22 0820  ? polyethylene glycol (MIRALAX / GLYCOLAX) packet 17 g  17 g Per Tube Daily Ollis, Brandi L, NP   17 g at 04/06/22 0907  ? revefenacin (YUPELRI) nebulizer solution 175 mcg  175 mcg Nebulization Daily Chesley Mires, MD   175 mcg at 04/08/22 0859  ? sodium chloride flush (NS) 0.9 % injection 10-40 mL  10-40 mL Intracatheter Q12H Chesley Mires, MD   10 mL at 04/08/22 1148  ? sodium chloride flush (NS) 0.9 % injection 10-40 mL  10-40 mL Intracatheter PRN Chesley Mires, MD   10 mL at 04/07/22 1522  ? sodium chloride flush (NS) 0.9 % injection 3 mL  3 mL Intravenous Q12H Marcelyn Bruins, MD   3 mL at 04/07/22 2217  ? vitamin B-12 (CYANOCOBALAMIN) tablet 1,000 mcg  1,000 mcg Per Tube Daily Noe Gens L, NP   1,000 mcg at 04/08/22 7182  ? ? ? ?Discharge Medications: ?Please see discharge summary for a list of discharge medications. ? ?Relevant Imaging Results: ? ?Relevant Lab Results: ? ? ?Additional Information ?[phone] ? ?Delayna Sparlin, RN ? ? ? ? ?

## 2022-04-09 ENCOUNTER — Ambulatory Visit: Payer: Medicare Other

## 2022-04-09 ENCOUNTER — Ambulatory Visit
Admission: RE | Admit: 2022-04-09 | Discharge: 2022-04-09 | Disposition: A | Discharge status: Home / Self Care | Payer: Medicare Other | Source: Ambulatory Visit | Attending: Radiation Oncology | Admitting: Radiation Oncology

## 2022-04-09 DIAGNOSIS — J441 Chronic obstructive pulmonary disease with (acute) exacerbation: Secondary | ICD-10-CM | POA: Diagnosis not present

## 2022-04-09 DIAGNOSIS — F419 Anxiety disorder, unspecified: Secondary | ICD-10-CM | POA: Diagnosis not present

## 2022-04-09 DIAGNOSIS — D6481 Anemia due to antineoplastic chemotherapy: Secondary | ICD-10-CM | POA: Diagnosis not present

## 2022-04-09 DIAGNOSIS — J9601 Acute respiratory failure with hypoxia: Secondary | ICD-10-CM | POA: Diagnosis not present

## 2022-04-09 LAB — CBC
HCT: 24.7 % — ABNORMAL LOW (ref 39.0–52.0)
Hemoglobin: 7.3 g/dL — ABNORMAL LOW (ref 13.0–17.0)
MCH: 29.3 pg (ref 26.0–34.0)
MCHC: 29.6 g/dL — ABNORMAL LOW (ref 30.0–36.0)
MCV: 99.2 fL (ref 80.0–100.0)
Platelets: 257 10*3/uL (ref 150–400)
RBC: 2.49 MIL/uL — ABNORMAL LOW (ref 4.22–5.81)
RDW: 16.1 % — ABNORMAL HIGH (ref 11.5–15.5)
WBC: 5.4 10*3/uL (ref 4.0–10.5)
nRBC: 0 % (ref 0.0–0.2)

## 2022-04-09 LAB — GLUCOSE, CAPILLARY
Glucose-Capillary: 139 mg/dL — ABNORMAL HIGH (ref 70–99)
Glucose-Capillary: 98 mg/dL (ref 70–99)

## 2022-04-09 LAB — PROCALCITONIN: Procalcitonin: <0.1 ng/mL

## 2022-04-09 MED ORDER — FREE WATER
100.0000 mL | Freq: Every day | Status: DC
  Administered 2022-04-09 – 2022-04-16 (×42): 100 mL

## 2022-04-09 MED ORDER — PROSOURCE TF PO LIQD
45.0000 mL | Freq: Three times a day (TID) | ORAL | Status: DC
  Administered 2022-04-09 – 2022-04-17 (×20): 45 mL
  Filled 2022-04-09 (×26): qty 45

## 2022-04-09 MED ORDER — GUAIFENESIN 100 MG/5ML PO LIQD
10.0000 mL | ORAL | Status: DC | PRN
  Administered 2022-04-09 – 2022-04-17 (×22): 10 mL via ORAL
  Filled 2022-04-09 (×24): qty 10

## 2022-04-09 MED ORDER — JEVITY 1.5 CAL/FIBER PO LIQD
1000.0000 mL | ORAL | Status: DC
  Administered 2022-04-09 – 2022-04-15 (×9): 1000 mL
  Filled 2022-04-09 (×15): qty 1000

## 2022-04-09 NOTE — Progress Notes (Signed)
? ?NAME:  Mark Yu, MRN:  [id number], DOB:  September 04, 1956, LOS: 57 ?ADMISSION DATE:  03/08/2022, CONSULTATION DATE:  3/14 ?REFERRING MD:  [place], CHIEF COMPLAINT:  Dyspnea  ? ?History of Present Illness:  ?66 y/o male presented with limited stage small cell lung cancer and COPD admitted with acute on chronic respiratory failure with hypoxemia due to rhinovirus requiring intubation on 3/14.  He eventually required tracheostomy  ? ?Pertinent  Medical History  ?Severe COPD with emphysema, Depression, Anxiety, HTN, extensive stage SCLC ?Concurrent systemic chemotherapy and radiation treatment ?Rhinovirus infection ? ?Significant Hospital Events: ?Including procedures, antibiotic start and stop dates in addition to other pertinent events   ?3/12 Admit with dyspnea, AECOPD in setting of rhinovirus  ?3/14 PCCM consulted for evaluation of respiratory failure. Intubated for airway protection  ?3/17 On vent, XRT  ?3/18 tolerating pressure support today ?3/19 not weaning as well today with every episode of bronchospasms ?3/20 PRBC for hgb of 6.6, PSV wean & extubated.  Failed due to global weakness/inability to clear secretions > reintubated.  ?Tracheal aspirate negative normal flora ?3/22 extubated. D/w family and pt trach vs one-way extubation. Pt was non-committal. Was on NIPPV for about 8 hrs then had to be re-intubated ?3/23 vanc and cefepime started. On-going family and pt discussion. He agreed to trach  ?Tracheal aspirate-no growth ?MRSA PCR negative ?03/20/22- TRACH - [name] and [name] ?03/21/22 -status post tracheostomy 40% FiO2 10 L oxygen.  Currently on trach collar.  Overnight was on ventilator.  Also on fentanyl infusion and Precedex infusion.  Nursing reports that he gets very anxious when trying to wean.  Nevertheless he sitting with tracheostomy and typing on the keyboard and asking appropriate questions.  On antibiotics cefepime.  On Jevity tube feeds.  Afebrile since 03/18/2022.  White count is coming  down. ?3/27 refused XRT again today ?3/28 stopped cefepime ?3/30 ATC daytime, Required increased oxygen overnight and placed back on vent ?4/10 Episode of AF/RVR, started back on amiodarone drip ?4/11 PCCM following on floor for trach, episode of hypoxia today, has RUL PNA, started on Cefepime ?4/13 No major issues over the last couple days. He remains on 8L ATC  ? ?Interim History / Subjective:  ?Seen sitting up in bed with no acute complaints, states he feel better after antibiotics were resumed  ? ?Objective   ?Blood pressure 111/67, pulse (!) 101, temperature (!) 97.3 ?F (36.3 ?C), temperature source Oral, resp. rate 20, height 5\' 11"  (1.803 m), weight 39.2 kg, SpO2 90 %. ?   ?FiO2 (%):  [35 %] 35 %  ? ?Intake/Output Summary (Last 24 hours) at 04/09/2022 0935 ?Last data filed at 04/09/2022 8101 ?Gross per 24 hour  ?Intake 3436 ml  ?Output 1600 ml  ?Net 1836 ml  ? ? ?Filed Weights  ? 03/29/22 0500 03/30/22 0500 03/31/22 0500  ?Weight: 41.2 kg 41 kg 39.2 kg  ? ?Physical Exam  ?General: Acute on chronically ill appearing thin deconditioned middle aged male lying in bed, in NAD ?HEENT: 6 cuffed shiley trach midline, MM pink/moist, PERRL,  ?Neuro: Alert and oriented x3, non-focal  ?CV: s1s2 regular rate and rhythm, no murmur, rubs, or gallops,  ?PULM:  Clear to ascultation bilaterally,  ?GI: soft, bowel sounds active in all 4 quadrants, non-tender, non-distended, tolerating TF ?Extremities: warm/dry, no edema  ?Skin: no rashes or lesions ? ?Resolved Hospital Problem list   ?Aspiration pneumonia ? ?Assessment & Plan:  ?Principal Problem: ?  Respiratory failure ([place]) ?Active Problems: ?  COPD exacerbation (  [place]) ?  Anxiety ?  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  Anemia associated with chemotherapy ?  Essential hypertension ?  Metastasis to lymph nodes ([place]) ?  Protein-calorie malnutrition, severe ?  Depression ?  Physical deconditioning ?  Tracheostomy dependent ([place]) ?  PAF (paroxysmal atrial  fibrillation) ([place]) ?  Pneumonia of right upper lobe due to infectious organism ? ?Acute on chronic hypoxemic/hypercarbic respiratory failure from rhinovirus pneumonia with COPD exacerbation  ?Cigarette smoker ?Baseline severe COPD with extensive centrilobular and bullous emphysema  ?Tracheostomy status with some bleeding, resolved ?Pneumonia, RUL  ?-RUL PNA on CXR 4/11, had an episode of desaturation after radiation, on 10L 40% ?Limited stage small cell lung cancer ?P: ?Remains on 5 day course of Cefepime  ?Continue uncuffed 6 shiley trach ?Once antibiotic course completed can consider capping trials early next week  ?Continue to encourage pulmonary hygiene  ?Remains on Brovana/pulmicort/Yupelri  ?Continue PO diuretics  ?Continue XRT per oncology (started 2/21) ?Will need outpatient hematology/oncology follow up  ? ?Rest of acute and chronic medical conditions managed per primary  ? ? ?Best Practice (right click and "Reselect all SmartList Selections" daily)  ?Per primary  ? ?Signature:   ? ?Antara Brecheisen D. Harris, NP-C ?[facility] ?Personal contact information can be found on Amion  ?04/09/2022, 9:44 AM ? ? ? ?

## 2022-04-09 NOTE — Progress Notes (Signed)
Pt was in radiation and RT wasn't able to do a trach check. RT will check his trach after radiation. ?

## 2022-04-09 NOTE — Progress Notes (Signed)
Nutrition Follow-up ? ?DOCUMENTATION CODES:  ? ?Severe malnutrition in context of chronic illness, Underweight ? ?INTERVENTION:  ?- will adjust TF regimen to meet re-estimated needs. ? ?- Jevity 1.5 @ 85 ml/hr x18 hours/day (1600-0800) with 45 ml Prosource TF TID and 100 ml free water x6/day. ? ?- this regimen will provide 2415 kcal, 131 grams protein, and 1763 ml free water. ? ?- weigh patient today.  ? ? ?NUTRITION DIAGNOSIS:  ? ?Severe Malnutrition related to chronic illness, cancer and cancer related treatments as evidenced by severe fat depletion, severe muscle depletion. -ongoing ? ?GOAL:  ? ?Patient will meet greater than or equal to 90% of their needs -met with TF regimen ? ?MONITOR:  ? ?TF tolerance, Diet advancement, Labs, Weight trends, Skin ? ?ASSESSMENT:  ? ?66 y.o. male with medical history of recently diagnosed small cell lung cancer, COPD, anxiety, HTN, and depression. He presented to the ED due to progressive shortness of breath with minimal exertion x2 days. he had been finished a steroid taper shortly before admission and had recently been prescribed an appetite stimulant. ? ?Significant Events: ?3/12- admission ?3/14- intubation; OGT placement; initial RD assessment; tube feeding initiation  ?3/20- extubation + re-intubation; OGT removal + replacement ?3/22- extubation + re-intubation; OGT removal + replacement ?3/24- tracheostomy + diagnostic bronch; OGT removed; small bore NGT placed in R nare (distal gastric per abdominal x-ray) ?4/4- NGT became clogged and was replaced; transitioned to TF over 18 hours/day ?4/5- TF placed on hold at midnight pending PEG on 4/6 ?4/7- PEG placed by IR ? ? ?Patient sitting up in bed with his fiance at bedside. Patient with PEG in place and order for Jevity 1.5 @ 75 ml/hr (1600-0800) with 45 ml Prosource TF TID and 100 mg free water every 4 hours. ? ?This regimen provides 2145 kcal, 119 grams protein, and 1626 ml free water.  ? ?Noted that TF was infusing at the  time of RD visit at ~1000. Able to talk with RN after visit to patient's room and requested that TF be turned off until 1600 per order as RN indicated that he was unaware of TF needing to be held through the night. ? ?He has not been weighed since 4/4. Re-estimated nutrition needs using IBW d/t weight loss throughout hospitalization (did not lasix order) and goal of weight restoration for patient with catabolic illness (cancer). ? ?No information documented in the edema section of flow sheet since 4/6 evening.  ? ?Patient shares that he is interested in being able to consume things PO as soon as possible as he misses experiencing the taste of things. He understands the need to remain NPO at this time.  ? ? ?Labs reviewed; CBG: 139 mg/dl, creatinine: <0.3 mg/dl, Ca: 8.2 mg/dl. ? ?Medications reviewed; 1000 units cholecalciferol/day, 1 mg folvite/day, 40 mg lasix per tube/day, imodium PRN, 400 mg mag-ox/day, 40 mg protonix per tube/day, 17 g miralax/day, 1000 mcg cyanocobalamin/day. ? ? ?Diet Order:   ?Diet Order   ? ?       ?  Diet NPO time specified Except for: Ice Chips  Diet effective now       ?  ? ?  ?  ? ?  ? ? ?EDUCATION NEEDS:  ? ?No education needs have been identified at this time ? ?Skin:  Skin Assessment: Skin Integrity Issues: ?Skin Integrity Issues:: DTI ?DTI: vertebral column ? ?Last BM:  4/13 (type 4 x1, medium amount) ? ?Height:  ? ?Ht Readings from Last 1 Encounters:  ?  03/18/22 5' 11"  (1.803 m)  ? ? ?Weight:  ? ?Wt Readings from Last 1 Encounters:  ?03/31/22 39.2 kg  ? ? ? ?BMI:  Body mass index is 12.05 kg/m?. ? ?Estimated Nutritional Needs:  ?Kcal:  2350-2550 kcal ?Protein:  115-130 grams ?Fluid:  >/= 2.3 L/day ? ? ? ? ?Jarome Matin, MS, RD, LDN ?Registered Dietitian II ?Inpatient Clinical Nutrition ?RD pager # and on-call/weekend pager # available in [place]  ? ?

## 2022-04-09 NOTE — Progress Notes (Addendum)
? ?PROGRESS NOTE ? ? ? ?Mark Yu  HUD:[id number] DOB: 1956-10-31 DOA: 03/08/2022 ?PCP: [name], MD ? ? 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 In thE ER, tachypneic to 20s, having severe SOB, SpO2 86% on room air. ?3/12: admitted  for Ae of COPD, CTA  No PE, new infiltrates, Rhinovirus+ **CODE BLUE** in PM after returning from RadTx, walked to bathroom without, found gray and agonal immediately after, bagged and transferred to [facility] on BiPAP ?3/13: Initially improved mentation and ABG on BiPAP but in AM again nearly unresponsive, again tachypneic to 30s, intubated. Had complicated hospital course with respiratory failure persistent w/ hx of COPD now s/p trach 3/24- weaning> on TC. PCCM following for downsizing and perhaps decannulation.  Patient is getting daily XRT for concurrent lung cancer which seems to be helping his pulmonary status overall.Evaluated by [facility] but denied by insurance.  With ongoing dysphagia speech recommended PEG tube placement 4/7-PEG tube feeding started 04/04/22 evening ? ? ?Assessment and Plan: ? ?Acute on chronic respiratory failure with hypoxia and hypercapnia ?COPD exacerbation ?Centrilobular and bullous emphysema ?Positive for rhinovirus, exacerbating COPD. Patient required ICU admission with intubation on 3/14 and subsequent tracheostomy on 3/24. Currently stable on trach collar with consideration to attempt decannulation prior to discharge.Capping trial planned for next week. ?-Continue oxygen ?-Continue Brovana and Brovana ?-Continue albuterol PRN ? ?Right upper lobe pneumonia ?Newly diagnosed on 4/11 chest x-ray. Started on empiric Cefepime. Negative MRSA swab. Pulmonology recommendations for 5 days of treatment ?-Continue Cefepime IV ? 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  Medical oncology with plans for outpatient follow-up. ? ?Anemia of chronic disease ?Also associated with chemotherapy. Baseline hemoglobin around 9. Drift down to 7.3. No obvious evidence of acute hemorrhage. ?-CBC in AM ?-FOBT ? ?Transient atrial fibrillation with RVR ?Patient received amiodarone with resolution of atrial fibrillation. No anticoagulation started. ? ?Primary hypertension ?-Continue amlodipine ? ?Anxiety ?Depression ?-Continue Xanax PRN ? ?Severe protein-calorie malnutrition ?Patient is s/p PEG tube placed on 4/7 ? ?Gross pharyngo-cervical esophageal dysphagia ?Evaluated by speech therapy with recommendations for alternative nutrition. Patient is s/p PEG tube as mentioned above. ? ?Hyponatremia ?Resolved ? ?Hypokalemia ?Resolved. ? ?Pressure injury ?Medial vertebral column. Not present on admission. ? ? ?DVT prophylaxis: SCDs ?Code Status:   Code Status: Full Code ?Family Communication: None at bedside ?Disposition Plan: Discharge to SNF likely in 4-5 days pending pulmonology recommendations, completion of IV antibiotics, possible decannulation. ? ? ?Consultants:  ?PCCM ?Radiation oncology ? ?Procedures:  ?Intubation/extubation ?Tracheostomy ?PEG tube placement ? ?Antimicrobials: ?Vancomycin ?Azithromycin ?Cefepime ? ? ?Subjective: ?No concerns this morning. Still with productive cough. ? ?Objective: ?BP 129/79 (BP Location: Left Arm)   Pulse 97   Temp 97.8 ?F (36.6 ?C) (Oral)   Resp 20   Ht 5\' 11"  (1.803 m)   Wt 46.4 kg   SpO2 94%   BMI 14.27 kg/m?  ? ?Examination: ? ?General exam: Appears calm and comfortable. Chronically ill appearing. ?Respiratory system: Clear to auscultation. Respiratory effort normal. ?Cardiovascular system: S1 & S2 heard, RRR. ?Gastrointestinal system: Abdomen is nondistended, soft and nontender. Normal bowel sounds heard. ?Central nervous system: Alert and oriented. No focal neurological deficits. ?Musculoskeletal: No edema. No calf tenderness ?Skin: No cyanosis. No  rashes ?Psychiatry: Judgement and insight appear normal. Mood & affect appropriate.  ? ? ?Data Reviewed: I  have personally reviewed following labs and imaging studies ? ?CBC ?Lab Results  ?Component Value Date  ? WBC 5.4 04/09/2022  ? RBC 2.49 (L) 04/09/2022  ? HGB 7.3 (L) 04/09/2022  ? HCT 24.7 (L) 04/09/2022  ? MCV 99.2 04/09/2022  ? MCH 29.3 04/09/2022  ? PLT 257 04/09/2022  ? MCHC 29.6 (L) 04/09/2022  ? RDW 16.1 (H) 04/09/2022  ? LYMPHSABS 0.4 (L) 03/25/2022  ? MONOABS 0.9 03/25/2022  ? EOSABS 0.0 03/25/2022  ? BASOSABS 0.0 03/25/2022  ? ? ? ?Last metabolic panel ?Lab Results  ?Component Value Date  ? NA 138 04/07/2022  ? K 3.5 04/07/2022  ? CL 99 04/07/2022  ? CO2 33 (H) 04/07/2022  ? BUN 17 04/07/2022  ? CREATININE <0.30 (L) 04/07/2022  ? GLUCOSE 140 (H) 04/07/2022  ? GFRNONAA NOT CALCULATED 04/07/2022  ? CALCIUM 8.2 (L) 04/07/2022  ? PHOS 4.0 04/04/2022  ? PROT 7.4 03/27/2022  ? ALBUMIN 2.9 (L) 03/27/2022  ? BILITOT 0.2 (L) 03/27/2022  ? ALKPHOS 69 03/27/2022  ? AST 17 03/27/2022  ? ALT 20 03/27/2022  ? ANIONGAP 6 04/07/2022  ? ? ?GFR: ?CrCl cannot be calculated (This lab value cannot be used to calculate CrCl because it is not a number: <0.30). ? ?Recent Results (from the past 240 hour(s))  ?MRSA Next Gen by PCR, Nasal     Status: None  ? Collection Time: 04/07/22  3:48 PM  ? Specimen: Nasal Mucosa; Nasal Swab  ?Result Value Ref Range Status  ? MRSA by PCR Next Gen NOT DETECTED NOT DETECTED Final  ?  Comment: (NOTE) ?The GeneXpert MRSA Assay (FDA approved for NASAL specimens only), ?is one component of a comprehensive MRSA colonization surveillance ?program. It is not intended to diagnose MRSA infection nor to guide ?or monitor treatment for MRSA infections. ?Test performance is not FDA approved in patients less than 2 years ?old. ?Performed at [facility], [place] [address] ?[place], [place] [address] ?  ?  ? ? ?Radiology Studies: ?No results found. ? ? ? LOS: 31 days  ? ? ?Cordelia Poche,  MD ?[facility] ?04/09/2022, 2:19 PM ? ? ?If 7PM-7AM, please contact night-coverage ?[email or web address] ? ?

## 2022-04-09 NOTE — Plan of Care (Signed)
?  Problem: Education: ?Goal: Knowledge of disease or condition will improve ?Outcome: Progressing ?  ?Problem: Respiratory: ?Goal: Ability to maintain a clear airway will improve ?Outcome: Progressing ?  ?Problem: Clinical Measurements: ?Goal: Respiratory complications will improve ?Outcome: Progressing ?  ?

## 2022-04-09 NOTE — Progress Notes (Signed)
Speech Language Pathology Treatment: Dysphagia  ?Patient Details ?Name: Mark Yu ?MRN: [id number] ?DOB: 1956/08/11 ?Today's Date: 04/09/2022 ?Time: 7616-0737 ?SLP Time Calculation (min) (ACUTE ONLY): 23 min ? ?Assessment / Plan / Recommendation ?Clinical Impression ? Pt seen for skilled SLP treatment to address dysphagia and initiate RMST for strengthening cough/expectoration and laryngeal elevation for airway protection.  Pt continues with congested coughing and inconsistent abilty to clear secretions. SLP removed PMSV to allow tracheal expectoration of some secretions - he prefers valve in place for secretion clearance however. [employer] Respironics PEP set at 5 cm H20 pressure =which is lowest setting on this device.   Use of trainer assists pt to cough and expectorate secretions -  He performed 5 repetitions over approximately 10 minutes due to requiring rest breaks from secretion expectoration causing mild dyspnea - but performed exercises well with min cues.  Using teach back to precautions and usage with information in writing - pt agreeable to perform 5 reps TID.  Pt denies any significant "coughing, choking" episodes with ice chips - or refluxing that may contribute to pna - suspect secretion aspiration contributing.  He advised he is not "swallowing the water from the ice" - recommend he swallow some water to decrease disuse muscle atrophy - he was in agreement.  In addition importance or oral care reviewed (as done in ICU - pt does not recall)  Left toothbrush/paste on his tray table for ease of reach. All information provided in written form and within pt's reach for his convenience.  Called secreatry and asked her to place note on computers that pt has trach and cannot speak without valve - so if he calls -please check on him. Will follow for continued dysphagia treament, pt is very motivated. ? ?  ?HPI HPI: Male with medical history of recently diagnosed small cell lung cancer, COPD, anxiety, HTN,  and depression. He presented to the ED due to progressive shortness of breath with minimal exertion x2 days. he had been finished a steroid taper shortly before admission and had recently been prescribed an appetite stimulant.     Significant Events:  3/12- admission  3/14- intubation; OGT placement; initial RD assessment; tube feeding initiation   3/20- extubation + re-intubation; OGT removal + replacement  3/22- extubation + re-intubation; OGT removal + replacement  3/24- tracheostomy + diagnostic bronch; OGT removed; small bore NGT placed in R nare (distal gastric per abdominal x-ray) per CCM notes.  Pt has undergone PMSV trials with adequate tolerance and phonation but rapid fatigue and increased amount of secretions present.   MBS completed showing severe dysphagia- Pt is s/p PEG. He has been libertated from the vent and states he may get trach removed. SLP following for dysphagia treatment. ?  ?   ?SLP Plan ? Continue with current plan of care (repeat MBS when clinically indicated) ? ?  ?  ?Recommendations for follow up therapy are one component of a multi-disciplinary discharge planning process, led by the attending physician.  Recommendations may be updated based on patient status, additional functional criteria and insurance authorization. ?  ? ?Recommendations  ?Diet recommendations:  (ice chips) ?Medication Administration: Via alternative means  ?   ? Patient may use Passy-Muir Speech Valve: During all waking hours (remove during sleep) ?PMSV Supervision: Intermittent ?MD: Please consider changing trach tube to : Smaller size  ?   ? ? ? ? Oral Care Recommendations: Oral care BID;Patient independent with oral care ?Follow Up Recommendations: Skilled nursing-short term rehab (<3 hours/day) ?Assistance recommended  at discharge: Frequent or constant Supervision/Assistance ?SLP Visit Diagnosis: Aphonia (R49.1);Dysphagia, pharyngeal phase (R13.13);Dysphagia, pharyngoesophageal phase (R13.14) ?Plan: Continue  with current plan of care (repeat MBS when clinically indicated) ? ? ? ? ?  ?  ? ? ?[name]. ?Kathleen Lime, MS [facility] SLP ?Acute Rehab Services ?Office [phone] ?Pager [phone] ? ? ? ?04/09/2022, 6:11 PM ?

## 2022-04-10 ENCOUNTER — Ambulatory Visit: Payer: Medicare Other

## 2022-04-10 ENCOUNTER — Ambulatory Visit
Admission: RE | Admit: 2022-04-10 | Discharge: 2022-04-10 | Disposition: A | Discharge status: Home / Self Care | Payer: Medicare Other | Source: Ambulatory Visit | Attending: Radiation Oncology | Admitting: Radiation Oncology

## 2022-04-10 DIAGNOSIS — J441 Chronic obstructive pulmonary disease with (acute) exacerbation: Secondary | ICD-10-CM | POA: Diagnosis not present

## 2022-04-10 DIAGNOSIS — J9601 Acute respiratory failure with hypoxia: Secondary | ICD-10-CM | POA: Diagnosis not present

## 2022-04-10 DIAGNOSIS — D6481 Anemia due to antineoplastic chemotherapy: Secondary | ICD-10-CM | POA: Diagnosis not present

## 2022-04-10 DIAGNOSIS — F419 Anxiety disorder, unspecified: Secondary | ICD-10-CM | POA: Diagnosis not present

## 2022-04-10 LAB — CBC
HCT: 23.8 % — ABNORMAL LOW (ref 39.0–52.0)
Hemoglobin: 7.3 g/dL — ABNORMAL LOW (ref 13.0–17.0)
MCH: 29.9 pg (ref 26.0–34.0)
MCHC: 30.7 g/dL (ref 30.0–36.0)
MCV: 97.5 fL (ref 80.0–100.0)
Platelets: 298 10*3/uL (ref 150–400)
RBC: 2.44 MIL/uL — ABNORMAL LOW (ref 4.22–5.81)
RDW: 15.9 % — ABNORMAL HIGH (ref 11.5–15.5)
WBC: 8.2 10*3/uL (ref 4.0–10.5)
nRBC: 0 % (ref 0.0–0.2)

## 2022-04-10 LAB — OCCULT BLOOD X 1 CARD TO LAB, STOOL: Fecal Occult Bld: POSITIVE — AB

## 2022-04-10 NOTE — Progress Notes (Signed)
Pharmacy Antibiotic Note ? ?Mark Yu is a 66 y.o. male admitted on 03/08/2022 with AeCOPD in the setting of rhinovirus.  Intubated 3/14, tracheostomy 3/24. Pharmacy has been consulted for Cefepime dosing for HAP. ? ?Day #3/5 Cefepime ?- Afebrile ?- WBC wnl ?- SCr < 0.3 ? ?Plan: ?Cefepime 2g IV q12h - estimated CrCl ~50 ml/min rounding SCr 0.8 ?Duration 5 days ordered - last dose 4/16 at 03:00 ? ?Height: 5\' 11"  (180.3 cm) ?Weight: 46.4 kg (102 lb 4.8 oz) ?IBW/kg (Calculated) : 75.3 ? ?Temp (24hrs), Avg:97.8 ?F (36.6 ?C), Min:97.7 ?F (36.5 ?C), Max:98.1 ?F (36.7 ?C) ? ?Recent Labs  ?Lab 04/04/22 ?0249 04/05/22 ?0356 04/06/22 ?0539 04/07/22 ?1621 04/09/22 ?0426  ?WBC  --  7.2  --  7.3 5.4  ?CREATININE 0.49* 0.40* <0.30* <0.30*  --   ? ?  ?CrCl cannot be calculated (This lab value cannot be used to calculate CrCl because it is not a number: <0.30).   ? ?Allergies  ?Allergen Reactions  ? Amoxicillin Rash  ? ? ?Antimicrobials this admission:  ?3/13 azithromycin  >>  3/17 ?3/23 cefepime >> 3/27 ?3/23 vancomycin >> 3/24 ?4/11 Cefepime >> (4/16) ? ?Microbiology results:  ?3/23 Resp cx: rare normal flora ?3/23 MRSA PCR: not detected ?3/20 Resp cx: rare normal flora ?3/12 Resp panel: rhinovirus+ ?4/11 MRSA PCR: neg ? ?Thank you for allowing pharmacy to be a part of this patient?s care. ? ?Peggyann Juba, PharmD, BCPS ?Pharmacy: [phone] ?04/10/2022 8:16 AM ? ?

## 2022-04-10 NOTE — TOC Progression Note (Signed)
Transition of Care (TOC) - Progression Note  ? ? ?Patient Details  ?Name: Mark Yu ?MRN: [id number] ?Date of Birth: 1956/12/18 ? ?Transition of Care (TOC) CM/SW Contact  ?Purcell Mouton, RN ?Phone Number: ?04/10/2022, 1:06 PM ? ?Clinical Narrative:    ? ?PASRR # [id number] A. ? ?Expected Discharge Plan: Home/Self Care ?Barriers to Discharge: Insurance Authorization ? ?Expected Discharge Plan and Services ?Expected Discharge Plan: Home/Self Care ?  ?Discharge Planning Services: CM Consult ?  ?Living arrangements for the past 2 months: [place] ?                ?  ?  ?  ?  ?  ?  ?  ?  ?  ?  ? ? ?Social Determinants of Health (SDOH) Interventions ?  ? ?Readmission Risk Interventions ? ?  03/09/2022  ? 12:52 PM  ?Readmission Risk Prevention Plan  ?Transportation Screening Complete  ?PCP or Specialist Appt within 3-5 Days Complete  ?[place] or Home Care Consult Complete  ?Social Work Consult for [place] Planning/Counseling Complete  ?Medication Review Press photographer) Complete  ? ? ?

## 2022-04-10 NOTE — Care Management Important Message (Signed)
Important Message ? ?Patient Details IM Letter placed in Patients room. ?Name: Mark Yu ?MRN: [id number] ?Date of Birth: [birth year] ? ? ?Medicare Important Message Given:  Yes ? ? ? ? ?Kerin Salen ?04/10/2022, 10:46 AM ?

## 2022-04-10 NOTE — Progress Notes (Signed)
? ?PROGRESS NOTE ? ? ? ?Mark Yu  ZOX:[id number] DOB: [birth year] DOA: 03/08/2022 ?PCP: [name], MD ? ? 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 In thE ER, tachypneic to 20s, having severe SOB, SpO2 86% on room air. ?3/12: admitted  for Ae of COPD, CTA  No PE, new infiltrates, Rhinovirus+ **CODE BLUE** in PM after returning from RadTx, walked to bathroom without, found gray and agonal immediately after, bagged and transferred to [facility] on BiPAP ?3/13: Initially improved mentation and ABG on BiPAP but in AM again nearly unresponsive, again tachypneic to 30s, intubated. Had complicated hospital course with respiratory failure persistent w/ hx of COPD now s/p trach 3/24- weaning> on TC. PCCM following for downsizing and perhaps decannulation.  Patient is getting daily XRT for concurrent lung cancer which seems to be helping his pulmonary status overall.Evaluated by [facility] but denied by insurance.  With ongoing dysphagia speech recommended PEG tube placement 4/7-PEG tube feeding started 04/04/22 evening ? ? ?Assessment and Plan: ? ?Acute on chronic respiratory failure with hypoxia and hypercapnia ?COPD exacerbation ?Centrilobular and bullous emphysema ?Positive for rhinovirus, exacerbating COPD. Patient required ICU admission with intubation on 3/14 and subsequent tracheostomy on 3/24. Currently stable on trach collar with consideration to attempt decannulation prior to discharge.Capping trial planned for next week. ?-Continue oxygen ?-Continue Brovana and Brovana ?-Continue albuterol PRN ? ?Right upper lobe pneumonia ?Newly diagnosed on 4/11 chest x-ray. Started on empiric Cefepime. Negative MRSA swab. Pulmonology recommendations for 5 days of treatment ?-Continue Cefepime IV ? 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  Medical oncology with plans for outpatient follow-up. ? ?Anemia of chronic disease ?Also associated with chemotherapy. Baseline hemoglobin around 9. Drift down to 7.3. No obvious evidence of acute hemorrhage. ?-Follow-up CBC ?-FOBT ? ?Transient atrial fibrillation with RVR ?Patient received amiodarone with resolution of atrial fibrillation. No anticoagulation started. ? ?Primary hypertension ?-Continue amlodipine ? ?Anxiety ?Depression ?-Continue Xanax PRN ? ?Severe protein-calorie malnutrition ?Patient is s/p PEG tube placed on 4/7 ? ?Gross pharyngo-cervical esophageal dysphagia ?Evaluated by speech therapy with recommendations for alternative nutrition. Patient is s/p PEG tube as mentioned above. ? ?Hyponatremia ?Resolved ? ?Hypokalemia ?Resolved. ? ?Pressure injury ?Medial vertebral column. Not present on admission. ? ? ?DVT prophylaxis: SCDs ?Code Status:   Code Status: Full Code ?Family Communication: None at bedside ?Disposition Plan: Discharge to SNF likely in 3-5 days pending pulmonology recommendations, completion of IV antibiotics, possible decannulation. ? ? ?Consultants:  ?PCCM ?Radiation oncology ? ?Procedures:  ?Intubation/extubation ?Tracheostomy ?PEG tube placement ? ?Antimicrobials: ?Vancomycin ?Azithromycin ?Cefepime ? ? ?Subjective: ?No issues this morning. ? ?Objective: ?BP 123/75 (BP Location: Right Arm)   Pulse (!) 101   Temp 97.7 ?F (36.5 ?C) (Oral)   Resp 18   Ht 5\' 11"  (1.803 m)   Wt 46.4 kg   SpO2 93%   BMI 14.27 kg/m?  ? ?Examination: ? ?General exam: Appears calm and comfortable ?Respiratory system: Diffuse rhonchi. Respiratory effort normal. ?Cardiovascular system: S1 & S2 heard, RRR. No murmurs, rubs, gallops or clicks. ?Gastrointestinal system: Abdomen is nondistended, soft and nontender. Normal bowel sounds heard. ?Central nervous system: Alert and oriented. No focal neurological deficits. ?Musculoskeletal: No edema. No calf tenderness ?Skin: No cyanosis. No rashes ?Psychiatry:  Judgement and insight appear normal. Mood & affect appropriate.   ? ? ?Data Reviewed: I have  personally reviewed following labs and imaging studies ? ?CBC ?Lab Results  ?Component Value Date  ? WBC 5.4 04/09/2022  ? RBC 2.49 (L) 04/09/2022  ? HGB 7.3 (L) 04/09/2022  ? HCT 24.7 (L) 04/09/2022  ? MCV 99.2 04/09/2022  ? MCH 29.3 04/09/2022  ? PLT 257 04/09/2022  ? MCHC 29.6 (L) 04/09/2022  ? RDW 16.1 (H) 04/09/2022  ? LYMPHSABS 0.4 (L) 03/25/2022  ? MONOABS 0.9 03/25/2022  ? EOSABS 0.0 03/25/2022  ? BASOSABS 0.0 03/25/2022  ? ? ? ?Last metabolic panel ?Lab Results  ?Component Value Date  ? NA 138 04/07/2022  ? K 3.5 04/07/2022  ? CL 99 04/07/2022  ? CO2 33 (H) 04/07/2022  ? BUN 17 04/07/2022  ? CREATININE <0.30 (L) 04/07/2022  ? GLUCOSE 140 (H) 04/07/2022  ? GFRNONAA NOT CALCULATED 04/07/2022  ? CALCIUM 8.2 (L) 04/07/2022  ? PHOS 4.0 04/04/2022  ? PROT 7.4 03/27/2022  ? ALBUMIN 2.9 (L) 03/27/2022  ? BILITOT 0.2 (L) 03/27/2022  ? ALKPHOS 69 03/27/2022  ? AST 17 03/27/2022  ? ALT 20 03/27/2022  ? ANIONGAP 6 04/07/2022  ? ? ?GFR: ?CrCl cannot be calculated (This lab value cannot be used to calculate CrCl because it is not a number: <0.30). ? ?Recent Results (from the past 240 hour(s))  ?MRSA Next Gen by PCR, Nasal     Status: None  ? Collection Time: 04/07/22  3:48 PM  ? Specimen: Nasal Mucosa; Nasal Swab  ?Result Value Ref Range Status  ? MRSA by PCR Next Gen NOT DETECTED NOT DETECTED Final  ?  Comment: (NOTE) ?The GeneXpert MRSA Assay (FDA approved for NASAL specimens only), ?is one component of a comprehensive MRSA colonization surveillance ?program. It is not intended to diagnose MRSA infection nor to guide ?or monitor treatment for MRSA infections. ?Test performance is not FDA approved in patients less than 2 years ?old. ?Performed at [facility], [address] ?[place], [place] [address] ?  ?  ? ? ?Radiology Studies: ?No results found. ? ? ? LOS: 32 days  ? ? ?Cordelia Poche, MD ?[facility] ?04/10/2022, 9:33 AM ? ? ?If 7PM-7AM, please contact night-coverage ?[email or web address] ? ?

## 2022-04-11 DIAGNOSIS — J441 Chronic obstructive pulmonary disease with (acute) exacerbation: Secondary | ICD-10-CM | POA: Diagnosis not present

## 2022-04-11 DIAGNOSIS — J9601 Acute respiratory failure with hypoxia: Secondary | ICD-10-CM | POA: Diagnosis not present

## 2022-04-11 DIAGNOSIS — F419 Anxiety disorder, unspecified: Secondary | ICD-10-CM | POA: Diagnosis not present

## 2022-04-11 DIAGNOSIS — D6481 Anemia due to antineoplastic chemotherapy: Secondary | ICD-10-CM | POA: Diagnosis not present

## 2022-04-11 LAB — GLUCOSE, CAPILLARY: Glucose-Capillary: 88 mg/dL (ref 70–99)

## 2022-04-11 MED ORDER — LIP MEDEX EX OINT
TOPICAL_OINTMENT | CUTANEOUS | Status: DC | PRN
  Filled 2022-04-11: qty 7

## 2022-04-11 NOTE — Progress Notes (Signed)
? ?PROGRESS NOTE ? ? ? ?Mark Yu  IOE:[id number] DOB: 30-May-1956 DOA: 03/08/2022 ?PCP: [name], MD ? ? 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 In thE ER, tachypneic to 20s, having severe SOB, SpO2 86% on room air. ?3/12: admitted  for Ae of COPD, CTA  No PE, new infiltrates, Rhinovirus+ **CODE BLUE** in PM after returning from RadTx, walked to bathroom without, found gray and agonal immediately after, bagged and transferred to [facility] on BiPAP ?3/13: Initially improved mentation and ABG on BiPAP but in AM again nearly unresponsive, again tachypneic to 30s, intubated. Had complicated hospital course with respiratory failure persistent w/ hx of COPD now s/p trach 3/24- weaning> on TC. PCCM following for downsizing and perhaps decannulation.  Patient is getting daily XRT for concurrent lung cancer which seems to be helping his pulmonary status overall.Evaluated by [facility] but denied by insurance.  With ongoing dysphagia speech recommended PEG tube placement 4/7-PEG tube feeding started 04/04/22 evening ? ? ?Assessment and Plan: ? ?Acute on chronic respiratory failure with hypoxia and hypercapnia ?COPD exacerbation ?Centrilobular and bullous emphysema ?Positive for rhinovirus, exacerbating COPD. Patient required ICU admission with intubation on 3/14 and subsequent tracheostomy on 3/24. Currently stable on trach collar with consideration to attempt decannulation prior to discharge.Capping trial planned for next week. ?-Continue oxygen ?-Continue Brovana and Brovana ?-Continue albuterol PRN ? ?Right upper lobe pneumonia ?Newly diagnosed on 4/11 chest x-ray. Started on empiric Cefepime. Negative MRSA swab. Pulmonology recommendations for 5 days of treatment ?-Continue Cefepime IV ? 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  Medical oncology with plans for outpatient follow-up. ? ?Anemia of chronic disease ?Also associated with chemotherapy. Baseline hemoglobin around 9. Drift down to 7.3. No obvious evidence of acute hemorrhage. FOBT positive. Normal BUN. ?-GI consult ?-CBC in AM ? ?Transient atrial fibrillation with RVR ?Patient received amiodarone with resolution of atrial fibrillation. No anticoagulation started. ? ?Primary hypertension ?-Continue amlodipine ? ?Anxiety ?Depression ?-Continue Xanax PRN ? ?Severe protein-calorie malnutrition ?Patient is s/p PEG tube placed on 4/7 ? ?Gross pharyngo-cervical esophageal dysphagia ?Evaluated by speech therapy with recommendations for alternative nutrition. Patient is s/p PEG tube as mentioned above. ? ?Hyponatremia ?Resolved ? ?Hypokalemia ?Resolved. ? ?Pressure injury ?Medial vertebral column. Not present on admission. ? ? ?DVT prophylaxis: SCDs ?Code Status:   Code Status: Full Code ?Family Communication: None at bedside ?Disposition Plan: Discharge to SNF likely in 2-5 days pending pulmonology recommendations, completion of IV antibiotics, possible decannulation. ? ? ?Consultants:  ?PCCM ?Radiation oncology ? ?Procedures:  ?Intubation/extubation ?Tracheostomy ?PEG tube placement ? ?Antimicrobials: ?Vancomycin ?Azithromycin ?Cefepime ? ? ?Subjective: ?No issues overnight. ? ?Objective: ?BP 118/74 (BP Location: Right Arm)   Pulse 93   Temp 97.6 ?F (36.4 ?C) (Oral)   Resp (!) 24   Ht 5\' 11"  (1.803 m)   Wt 46.4 kg   SpO2 95%   BMI 14.27 kg/m?  ? ?Examination: ? ?General exam: Appears calm and comfortable ?Respiratory system: Diffuse rhonchi. Respiratory effort normal. ?Cardiovascular system: S1 & S2 heard, RRR. No murmurs, rubs, gallops or clicks. ?Gastrointestinal system: Abdomen is nondistended, soft and nontender. No organomegaly or masses felt. Normal bowel sounds heard. ?Central nervous system: Alert and oriented. No focal neurological deficits. ?Musculoskeletal: No edema. No  calf tenderness ?Skin: No cyanosis. No rashes ?Psychiatry: Judgement and insight appear normal. Mood &  affect appropriate.  ? ? ?Data Reviewed: I have personally reviewed following labs and imaging studies ? ?CBC ?Lab Results  ?Component Value Date  ? WBC 8.2 04/10/2022  ? RBC 2.44 (L) 04/10/2022  ? HGB 7.3 (L) 04/10/2022  ? HCT 23.8 (L) 04/10/2022  ? MCV 97.5 04/10/2022  ? MCH 29.9 04/10/2022  ? PLT 298 04/10/2022  ? MCHC 30.7 04/10/2022  ? RDW 15.9 (H) 04/10/2022  ? LYMPHSABS 0.4 (L) 03/25/2022  ? MONOABS 0.9 03/25/2022  ? EOSABS 0.0 03/25/2022  ? BASOSABS 0.0 03/25/2022  ? ? ? ?Last metabolic panel ?Lab Results  ?Component Value Date  ? NA 138 04/07/2022  ? K 3.5 04/07/2022  ? CL 99 04/07/2022  ? CO2 33 (H) 04/07/2022  ? BUN 17 04/07/2022  ? CREATININE <0.30 (L) 04/07/2022  ? GLUCOSE 140 (H) 04/07/2022  ? GFRNONAA NOT CALCULATED 04/07/2022  ? CALCIUM 8.2 (L) 04/07/2022  ? PHOS 4.0 04/04/2022  ? PROT 7.4 03/27/2022  ? ALBUMIN 2.9 (L) 03/27/2022  ? BILITOT 0.2 (L) 03/27/2022  ? ALKPHOS 69 03/27/2022  ? AST 17 03/27/2022  ? ALT 20 03/27/2022  ? ANIONGAP 6 04/07/2022  ? ? ?GFR: ?CrCl cannot be calculated (This lab value cannot be used to calculate CrCl because it is not a number: <0.30). ? ?Recent Results (from the past 240 hour(s))  ?MRSA Next Gen by PCR, Nasal     Status: None  ? Collection Time: 04/07/22  3:48 PM  ? Specimen: Nasal Mucosa; Nasal Swab  ?Result Value Ref Range Status  ? MRSA by PCR Next Gen NOT DETECTED NOT DETECTED Final  ?  Comment: (NOTE) ?The GeneXpert MRSA Assay (FDA approved for NASAL specimens only), ?is one component of a comprehensive MRSA colonization surveillance ?program. It is not intended to diagnose MRSA infection nor to guide ?or monitor treatment for MRSA infections. ?Test performance is not FDA approved in patients less than 2 years ?old. ?Performed at [facility], [address] ?[place], [place] [address] ?  ?  ? ? ?Radiology Studies: ?No results found. ? ? ?  LOS: 33 days  ? ? ?Cordelia Poche, MD ?[facility] ?04/11/2022, 4:32 PM ? ? ?If 7PM-7AM, please contact night-coverage ?[email or web address] ? ?

## 2022-04-11 NOTE — Progress Notes (Signed)
? ?  PT Cancellation Note ? ?Patient Details ?Name: Mark Yu ?MRN: [id number] ?DOB: May 23, 1956 ? ? ?Cancelled Treatment:    Reason Eval/Treat Not Completed: Patient declined, no reason specified, patient declined, offered various  therapy options: exercises, sitting on bed edge, OOB to recliner, even into  a WC for mobility. ? Patient tangential conversation starting with wanting therapy to help get him to a clinic to get his eyes  examined so an get glasses,  states that he wants to work with PT but is not at a point to be able to do it. Continue PT efforts to work on mobility . Marland Kitchen ?Tresa Endo PT ?Acute Rehabilitation Services ?Pager [phone] ?Office [phone] ? ? ? ?Johne Buckle, Shella Maxim ?04/11/2022, 9:42 AM ?

## 2022-04-12 ENCOUNTER — Encounter (HOSPITAL_COMMUNITY): Payer: Self-pay | Admitting: Family Medicine

## 2022-04-12 DIAGNOSIS — J9601 Acute respiratory failure with hypoxia: Secondary | ICD-10-CM | POA: Diagnosis not present

## 2022-04-12 DIAGNOSIS — J441 Chronic obstructive pulmonary disease with (acute) exacerbation: Secondary | ICD-10-CM | POA: Diagnosis not present

## 2022-04-12 DIAGNOSIS — D6481 Anemia due to antineoplastic chemotherapy: Secondary | ICD-10-CM | POA: Diagnosis not present

## 2022-04-12 DIAGNOSIS — F419 Anxiety disorder, unspecified: Secondary | ICD-10-CM | POA: Diagnosis not present

## 2022-04-12 LAB — CBC
HCT: 23.8 % — ABNORMAL LOW (ref 39.0–52.0)
Hemoglobin: 7.3 g/dL — ABNORMAL LOW (ref 13.0–17.0)
MCH: 30.2 pg (ref 26.0–34.0)
MCHC: 30.7 g/dL (ref 30.0–36.0)
MCV: 98.3 fL (ref 80.0–100.0)
Platelets: 238 10*3/uL (ref 150–400)
RBC: 2.42 MIL/uL — ABNORMAL LOW (ref 4.22–5.81)
RDW: 16.2 % — ABNORMAL HIGH (ref 11.5–15.5)
WBC: 4.6 10*3/uL (ref 4.0–10.5)
nRBC: 0 % (ref 0.0–0.2)

## 2022-04-12 LAB — GLUCOSE, CAPILLARY
Glucose-Capillary: 112 mg/dL — ABNORMAL HIGH (ref 70–99)
Glucose-Capillary: 96 mg/dL (ref 70–99)

## 2022-04-12 NOTE — Progress Notes (Signed)
? ?PROGRESS NOTE ? ? ? ?Mark Yu  SWF:[id number] DOB: 1956-12-17 DOA: 03/08/2022 ?PCP: [name], MD ? ? 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 In thE ER, tachypneic to 20s, having severe SOB, SpO2 86% on room air. ?3/12: admitted  for Ae of COPD, CTA  No PE, new infiltrates, Rhinovirus+ **CODE BLUE** in PM after returning from RadTx, walked to bathroom without, found gray and agonal immediately after, bagged and transferred to [facility] on BiPAP ?3/13: Initially improved mentation and ABG on BiPAP but in AM again nearly unresponsive, again tachypneic to 30s, intubated. Had complicated hospital course with respiratory failure persistent w/ hx of COPD now s/p trach 3/24- weaning> on TC. PCCM following for downsizing and perhaps decannulation.  Patient is getting daily XRT for concurrent lung cancer which seems to be helping his pulmonary status overall.Evaluated by [facility] but denied by insurance.  With ongoing dysphagia speech recommended PEG tube placement 4/7-PEG tube feeding started 04/04/22 evening ? ? ?Assessment and Plan: ? ?Acute on chronic respiratory failure with hypoxia and hypercapnia ?COPD exacerbation ?Centrilobular and bullous emphysema ?Positive for rhinovirus, exacerbating COPD. Patient required ICU admission with intubation on 3/14 and subsequent tracheostomy on 3/24. Currently stable on trach collar with consideration to attempt decannulation prior to discharge.Capping trial planned for next week. ?-Continue oxygen ?-Continue Brovana and Brovana ?-Continue albuterol PRN ? ?Right upper lobe pneumonia ?Newly diagnosed on 4/11 chest x-ray. Started on empiric Cefepime. Negative MRSA swab. Pulmonology recommendations for 5 days of treatment ?-Continue Cefepime IV (last day of treatment today) ? 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 Medical oncology with plans for outpatient follow-up. ? ?Anemia of chronic disease ?Also associated with chemotherapy. Baseline hemoglobin around 9. Drift down to 7.3. No obvious evidence of acute hemorrhage. FOBT positive. Normal BUN. On chart review, PET scan from January showed a hypermetabolic area of his proximal colon. Patient reports not ever having a colonoscopy. Hemoglobin has stabilized. Lovenox discontinued on 4/13. ?-[facility] consulted (4/16) ?-CBC in AM ? ?Transient atrial fibrillation with RVR ?Patient received amiodarone with resolution of atrial fibrillation. No anticoagulation started. ? ?Primary hypertension ?-Continue amlodipine ? ?Anxiety ?Depression ?-Continue Xanax PRN ? ?Severe protein-calorie malnutrition ?Patient is s/p PEG tube placed on 4/7 ? ?Gross pharyngo-cervical esophageal dysphagia ?Evaluated by speech therapy with recommendations for alternative nutrition. Patient is s/p PEG tube as mentioned above. ? ?Hyponatremia ?Resolved ? ?Hypokalemia ?Resolved. ? ?Pressure injury ?Medial vertebral column. Not present on admission. ? ? ?DVT prophylaxis: SCDs ?Code Status:   Code Status: Full Code ?Family Communication: None at bedside ?Disposition Plan: Discharge to SNF likely in 2-5 days pending pulmonology recommendations, completion of IV antibiotics, possible decannulation. Also pending GI recommendations ? ? ?Consultants:  ?PCCM ?Radiation oncology ? ?Procedures:  ?Intubation/extubation ?Tracheostomy ?PEG tube placement ? ?Antimicrobials: ?Vancomycin ?Azithromycin ?Cefepime ? ? ?Subjective: ?No issues overnight. Had a breathing treatment this morning. ? ?Objective: ?BP 120/79 (BP Location: Right Arm)   Pulse 95   Temp 97.6 ?F (36.4 ?C) (Oral)   Resp (!) 27   Ht 5\' 11"  (1.803 m)   Wt 46.4 kg   SpO2 95%   BMI 14.27 kg/m?  ? ?Examination: ? ?General exam: Appears calm and comfortable  ?Respiratory system: Mild rhonchi. Respiratory effort  normal. ?Cardiovascular system: S1 & S2 heard, RRR. No murmurs. ?Gastrointestinal system: Abdomen  is nondistended, soft and nontender. Normal bowel sounds heard. ?Central nervous system: Alert and oriented. No focal neurological deficits. ?Musculoskeletal: No edema. No calf tenderness ?Skin: No cyanosis. No rashes ?Psychiatry: Judgement and insight appear normal. Mood & affect appropriate.  ? ? ?Data Reviewed: I have personally reviewed following labs and imaging studies ? ?CBC ?Lab Results  ?Component Value Date  ? WBC 4.6 04/12/2022  ? RBC 2.42 (L) 04/12/2022  ? HGB 7.3 (L) 04/12/2022  ? HCT 23.8 (L) 04/12/2022  ? MCV 98.3 04/12/2022  ? MCH 30.2 04/12/2022  ? PLT 238 04/12/2022  ? MCHC 30.7 04/12/2022  ? RDW 16.2 (H) 04/12/2022  ? LYMPHSABS 0.4 (L) 03/25/2022  ? MONOABS 0.9 03/25/2022  ? EOSABS 0.0 03/25/2022  ? BASOSABS 0.0 03/25/2022  ? ? ? ?Last metabolic panel ?Lab Results  ?Component Value Date  ? NA 138 04/07/2022  ? K 3.5 04/07/2022  ? CL 99 04/07/2022  ? CO2 33 (H) 04/07/2022  ? BUN 17 04/07/2022  ? CREATININE <0.30 (L) 04/07/2022  ? GLUCOSE 140 (H) 04/07/2022  ? GFRNONAA NOT CALCULATED 04/07/2022  ? CALCIUM 8.2 (L) 04/07/2022  ? PHOS 4.0 04/04/2022  ? PROT 7.4 03/27/2022  ? ALBUMIN 2.9 (L) 03/27/2022  ? BILITOT 0.2 (L) 03/27/2022  ? ALKPHOS 69 03/27/2022  ? AST 17 03/27/2022  ? ALT 20 03/27/2022  ? ANIONGAP 6 04/07/2022  ? ? ?GFR: ?CrCl cannot be calculated (This lab value cannot be used to calculate CrCl because it is not a number: <0.30). ? ?Recent Results (from the past 240 hour(s))  ?MRSA Next Gen by PCR, Nasal     Status: None  ? Collection Time: 04/07/22  3:48 PM  ? Specimen: Nasal Mucosa; Nasal Swab  ?Result Value Ref Range Status  ? MRSA by PCR Next Gen NOT DETECTED NOT DETECTED Final  ?  Comment: (NOTE) ?The GeneXpert MRSA Assay (FDA approved for NASAL specimens only), ?is one component of a comprehensive MRSA colonization surveillance ?program. It is not intended to diagnose MRSA infection nor  to guide ?or monitor treatment for MRSA infections. ?Test performance is not FDA approved in patients less than 2 years ?old. ?Performed at [facility], [address] ?[place], [place] [address] ?  ?  ? ? ?Radiology Studies: ?No results found. ? ? ? LOS: 34 days  ? ? ?Cordelia Poche, MD ?[facility] ?04/12/2022, 10:14 AM ? ? ?If 7PM-7AM, please contact night-coverage ?[email or web address] ? ?

## 2022-04-12 NOTE — Consult Note (Signed)
Reason for Consult: Guaiac positive anemia ?Referring Physician: Hospital team ?Mark Yu is an 66 y.o. male.  ?HPI: Patient seen and examined and hospital computer chart reviewed and case discussed with the hospital team and surprisingly he has no GI complaints and he likes his PEG tube which was placed recently and he has no lower bowel complaints and has not had any previous endoscopy or colonoscopy and he was aware of the PET scan findings but his oncologist was not concerned and thought he could wait till after therapy for his lung cancer and he also recently had a trach both that surgery and his PEG could lead to guaiac positivity and he is also on some ibuprofen and subcu Lovenox and he was transfused earlier in the hospital stay and his family history is negative for any GI issues and he has no other complaints other than generally frustrated about the multiple doctors he has seen during this hospital stay ? ?Past Medical History:  ?Diagnosis Date  ? Anxiety   ? tx xanax  ? COPD (chronic obstructive pulmonary disease) ([place])   ? tx inhalers  ? Depression   ? Dyspnea   ? with exertion  ? HTN (hypertension)   ? tx amlodipine  ? Hyponatremia 03/08/2022  ? ? ?Past Surgical History:  ?Procedure Laterality Date  ? BRONCHIAL BIOPSY  02/02/2022  ? Procedure: BRONCHIAL BIOPSIES;  Surgeon: Collene Gobble, MD;  Location: [facility] ENDOSCOPY;  Service: Pulmonary;;  ? BRONCHIAL BRUSHINGS  02/02/2022  ? Procedure: BRONCHIAL BRUSHINGS;  Surgeon: Collene Gobble, MD;  Location: [facility] ENDOSCOPY;  Service: Pulmonary;;  ? BRONCHIAL NEEDLE ASPIRATION BIOPSY  02/02/2022  ? Procedure: BRONCHIAL NEEDLE ASPIRATION BIOPSIES;  Surgeon: Collene Gobble, MD;  Location: [facility] ENDOSCOPY;  Service: Pulmonary;;  ? IR GASTROSTOMY TUBE MOD SED  04/03/2022  ? IR IMAGING GUIDED PORT INSERTION  02/24/2022  ? MRI    ? NO PAST SURGERIES    ? VIDEO BRONCHOSCOPY WITH ENDOBRONCHIAL ULTRASOUND N/A 02/02/2022  ? Procedure: VIDEO BRONCHOSCOPY WITH ENDOBRONCHIAL  ULTRASOUND;  Surgeon: Collene Gobble, MD;  Location: [facility] ENDOSCOPY;  Service: Pulmonary;  Laterality: N/A;  ? VIDEO BRONCHOSCOPY WITH RADIAL ENDOBRONCHIAL ULTRASOUND  02/02/2022  ? Procedure: VIDEO BRONCHOSCOPY WITH RADIAL ENDOBRONCHIAL ULTRASOUND;  Surgeon: Collene Gobble, MD;  Location: [facility] ENDOSCOPY;  Service: Pulmonary;;  ? ? ?Family History  ?Problem Relation Age of Onset  ? Alzheimer's disease Mother   ? Multiple sclerosis Father   ? ? ?Social History:  reports that he has been smoking cigarettes. He has a 12.50 pack-year smoking history. He does not have any smokeless tobacco history on file. He reports current alcohol use of about 30.0 standard drinks per week. He reports current drug use. Drug: Marijuana. ? ?Allergies:  ?Allergies  ?Allergen Reactions  ? Amoxicillin Rash  ? ? ?Medications: I have reviewed the patient's current medications. ? ?Results for orders placed or performed during the hospital encounter of 03/08/22 (from the past 48 hour(s))  ?Glucose, capillary     Status: None  ? Collection Time: 04/11/22  5:12 PM  ?Result Value Ref Range  ? Glucose-Capillary 88 70 - 99 mg/dL  ?  Comment: Glucose reference range applies only to samples taken after fasting for at least 8 hours.  ?CBC     Status: Abnormal  ? Collection Time: 04/12/22  3:59 AM  ?Result Value Ref Range  ? WBC 4.6 4.0 - 10.5 K/uL  ? RBC 2.42 (L) 4.22 - 5.81 MIL/uL  ? Hemoglobin  7.3 (L) 13.0 - 17.0 g/dL  ? HCT 23.8 (L) 39.0 - 52.0 %  ? MCV 98.3 80.0 - 100.0 fL  ? MCH 30.2 26.0 - 34.0 pg  ? MCHC 30.7 30.0 - 36.0 g/dL  ? RDW 16.2 (H) 11.5 - 15.5 %  ? Platelets 238 150 - 400 K/uL  ? nRBC 0.0 0.0 - 0.2 %  ?  Comment: Performed at [facility], [place] [address], [place], [place] [address]  ?Glucose, capillary     Status: Abnormal  ? Collection Time: 04/12/22  7:59 AM  ?Result Value Ref Range  ? Glucose-Capillary 112 (H) 70 - 99 mg/dL  ?  Comment: Glucose reference range applies only to samples taken after fasting for at least 8  hours.  ? ? ?No results found. ? ?ROS negative except above ?Blood pressure 120/79, pulse 95, temperature 97.6 ?F (36.4 ?C), temperature source Oral, resp. rate (!) 27, height 5\' 11"  (1.803 m), weight 46.4 kg, SpO2 95 %. ?Physical Exam vital signs stable afebrile no acute distress abdomen is soft nontender PEG site looks good no recent BUN hemoglobin stable for 3 days slight drop from the fifth abnormal PET scan noted no other obvious GI pathology on scanning ? 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 ?[facility] ?04/12/2022, 11:19 AM  ? ? ? ? ?

## 2022-04-13 ENCOUNTER — Inpatient Hospital Stay: Payer: Medicare Other | Admitting: Internal Medicine

## 2022-04-13 ENCOUNTER — Inpatient Hospital Stay: Payer: Medicare Other

## 2022-04-13 ENCOUNTER — Other Ambulatory Visit: Payer: Medicare Other

## 2022-04-13 ENCOUNTER — Ambulatory Visit: Payer: Medicare Other

## 2022-04-13 ENCOUNTER — Ambulatory Visit
Admission: RE | Admit: 2022-04-13 | Discharge: 2022-04-13 | Disposition: A | Discharge status: Home / Self Care | Payer: Medicare Other | Source: Ambulatory Visit | Attending: Radiation Oncology | Admitting: Radiation Oncology

## 2022-04-13 DIAGNOSIS — F419 Anxiety disorder, unspecified: Secondary | ICD-10-CM | POA: Diagnosis not present

## 2022-04-13 DIAGNOSIS — D6481 Anemia due to antineoplastic chemotherapy: Secondary | ICD-10-CM | POA: Diagnosis not present

## 2022-04-13 DIAGNOSIS — C3412 Malignant neoplasm of upper lobe, left bronchus or lung: Secondary | ICD-10-CM | POA: Diagnosis not present

## 2022-04-13 DIAGNOSIS — J441 Chronic obstructive pulmonary disease with (acute) exacerbation: Secondary | ICD-10-CM | POA: Diagnosis not present

## 2022-04-13 DIAGNOSIS — J9601 Acute respiratory failure with hypoxia: Secondary | ICD-10-CM | POA: Diagnosis not present

## 2022-04-13 LAB — GLUCOSE, CAPILLARY
Glucose-Capillary: 126 mg/dL — ABNORMAL HIGH (ref 70–99)
Glucose-Capillary: 84 mg/dL (ref 70–99)

## 2022-04-13 NOTE — Progress Notes (Signed)
PT denies need for trach suctioning at this time. ?

## 2022-04-13 NOTE — Progress Notes (Signed)
? ?PROGRESS NOTE ? ? ? ?Mark Yu  LKG:[id number] DOB: [birth year] DOA: 03/08/2022 ?PCP: [name], MD ? ? 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 In thE ER, tachypneic to 20s, having severe SOB, SpO2 86% on room air. ?3/12: admitted  for Ae of COPD, CTA  No PE, new infiltrates, Rhinovirus+ **CODE BLUE** in PM after returning from RadTx, walked to bathroom without, found gray and agonal immediately after, bagged and transferred to [facility] on BiPAP ?3/13: Initially improved mentation and ABG on BiPAP but in AM again nearly unresponsive, again tachypneic to 30s, intubated. Had complicated hospital course with respiratory failure persistent w/ hx of COPD now s/p trach 3/24- weaning> on TC. PCCM following for downsizing and perhaps decannulation.  Patient is getting daily XRT for concurrent lung cancer which seems to be helping his pulmonary status overall.Evaluated by [facility] but denied by insurance.  With ongoing dysphagia speech recommended PEG tube placement 4/7-PEG tube feeding started 04/04/22 evening ? ? ?Assessment and Plan: ? ?Acute on chronic respiratory failure with hypoxia and hypercapnia ?COPD exacerbation ?Centrilobular and bullous emphysema ?S/p tracheostomy ?Positive for rhinovirus, exacerbating COPD. Patient required ICU admission with intubation on 3/14 and subsequent tracheostomy on 3/24. Currently stable on trach collar with consideration to attempt decannulation prior to discharge. Capping trial. ?-Continue oxygen ?-Continue Brovana and Brovana ?-Continue albuterol PRN ? ?Right upper lobe pneumonia ?Newly diagnosed on 4/11 chest x-ray. Started on empiric Cefepime. Negative MRSA swab. Pulmonology recommendations for 5 days of treatment. Completed treatment. ? 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  Medical oncology with plans for outpatient follow-up. ? ?Anemia of chronic disease ?Also associated with chemotherapy. Baseline hemoglobin around 9. Drift down to 7.3. No obvious evidence of acute hemorrhage. FOBT positive. Normal BUN. On chart review, PET scan from January showed a hypermetabolic area of his proximal colon. Patient reports not ever having a colonoscopy. Hemoglobin has stabilized. Lovenox discontinued on 4/13. ?-[facility] consulted (4/16) and recommend no workup. ? ?Transient atrial fibrillation with RVR ?Patient received amiodarone with resolution of atrial fibrillation. No anticoagulation started. ? ?Primary hypertension ?-Continue amlodipine ? ?Anxiety ?Depression ?-Continue Xanax PRN ? ?Severe protein-calorie malnutrition ?Patient is s/p PEG tube placed on 4/7 ? ?Gross pharyngo-cervical esophageal dysphagia ?Evaluated by speech therapy with recommendations for alternative nutrition. Patient is s/p PEG tube as mentioned above. ? ?Hyponatremia ?Resolved ? ?Hypokalemia ?Resolved. ? ?Pressure injury ?Medial vertebral column. Not present on admission. ? ? ?DVT prophylaxis: SCDs ?Code Status:   Code Status: Full Code ?Family Communication: Fiance at bedside ?Disposition Plan: Discharge to SNF likely in 2-3 days pending pulmonology recommendations and possible decannulation. ? ? ?Consultants:  ?PCCM ?Radiation oncology ? ?Procedures:  ?Intubation/extubation ?Tracheostomy ?PEG tube placement ? ?Antimicrobials: ?Vancomycin ?Azithromycin ?Cefepime ? ? ?Subjective: ?No issues noted overnight. No GI bleeding noted. ? ?Objective: ?BP 128/83 (BP Location: Right Arm)   Pulse 95   Temp 98 ?F (36.7 ?C) (Oral)   Resp 17   Ht 5\' 11"  (1.803 m)   Wt 46.4 kg   SpO2 95%   BMI 14.27 kg/m?  ? ?Examination: ? ?General exam: Appears calm and comfortable ?Respiratory system: Respiratory effort normal. ?Gastrointestinal system: Abdomen is non-distended ?Central nervous system: Alert and oriented ?Psychiatry: Judgement  and insight appear normal. Mood & affect appropriate.  ? ? ?Data Reviewed: I have personally reviewed  following labs and imaging studies ? ?CBC ?Lab Results  ?Component Value Date  ? WBC 4.6 04/12/2022  ? RBC 2.42 (L) 04/12/2022  ? HGB 7.3 (L) 04/12/2022  ? HCT 23.8 (L) 04/12/2022  ? MCV 98.3 04/12/2022  ? MCH 30.2 04/12/2022  ? PLT 238 04/12/2022  ? MCHC 30.7 04/12/2022  ? RDW 16.2 (H) 04/12/2022  ? LYMPHSABS 0.4 (L) 03/25/2022  ? MONOABS 0.9 03/25/2022  ? EOSABS 0.0 03/25/2022  ? BASOSABS 0.0 03/25/2022  ? ? ? ?Last metabolic panel ?Lab Results  ?Component Value Date  ? NA 138 04/07/2022  ? K 3.5 04/07/2022  ? CL 99 04/07/2022  ? CO2 33 (H) 04/07/2022  ? BUN 17 04/07/2022  ? CREATININE <0.30 (L) 04/07/2022  ? GLUCOSE 140 (H) 04/07/2022  ? GFRNONAA NOT CALCULATED 04/07/2022  ? CALCIUM 8.2 (L) 04/07/2022  ? PHOS 4.0 04/04/2022  ? PROT 7.4 03/27/2022  ? ALBUMIN 2.9 (L) 03/27/2022  ? BILITOT 0.2 (L) 03/27/2022  ? ALKPHOS 69 03/27/2022  ? AST 17 03/27/2022  ? ALT 20 03/27/2022  ? ANIONGAP 6 04/07/2022  ? ? ?GFR: ?CrCl cannot be calculated (This lab value cannot be used to calculate CrCl because it is not a number: <0.30). ? ?Recent Results (from the past 240 hour(s))  ?MRSA Next Gen by PCR, Nasal     Status: None  ? Collection Time: 04/07/22  3:48 PM  ? Specimen: Nasal Mucosa; Nasal Swab  ?Result Value Ref Range Status  ? MRSA by PCR Next Gen NOT DETECTED NOT DETECTED Final  ?  Comment: (NOTE) ?The GeneXpert MRSA Assay (FDA approved for NASAL specimens only), ?is one component of a comprehensive MRSA colonization surveillance ?program. It is not intended to diagnose MRSA infection nor to guide ?or monitor treatment for MRSA infections. ?Test performance is not FDA approved in patients less than 2 years ?old. ?Performed at [facility], [address] ?[place], [place] [address] ?  ?  ? ? ?Radiology Studies: ?No results found. ? ? ? LOS: 35 days  ? ? ?Cordelia Poche, MD ?[facility] ?04/13/2022,  5:43 PM ? ? ?If 7PM-7AM, please contact night-coverage ?[email or web address] ? ?

## 2022-04-13 NOTE — Progress Notes (Addendum)
? ?NAME:  Mark Yu, MRN:  [id number], DOB:  02/11/1956, LOS: 59 ?ADMISSION DATE:  03/08/2022, CONSULTATION DATE:  3/14 ?REFERRING MD:  [place], CHIEF COMPLAINT:  Dyspnea  ? ?History of Present Illness:  ?66 y/o male presented with limited stage small cell lung cancer and COPD admitted with acute on chronic respiratory failure with hypoxemia due to rhinovirus requiring intubation on 3/14.  He eventually required tracheostomy  ? ?Pertinent  Medical History  ?Severe COPD with emphysema, Depression, Anxiety, HTN, extensive stage SCLC ?Concurrent systemic chemotherapy and radiation treatment ?Rhinovirus infection ? ?Significant Hospital Events: ?Including procedures, antibiotic start and stop dates in addition to other pertinent events   ?3/12 Admit with dyspnea, AECOPD in setting of rhinovirus  ?3/14 PCCM consulted for evaluation of respiratory failure. Intubated for airway protection  ?3/17 On vent, XRT  ?3/18 tolerating pressure support today ?3/19 not weaning as well today with every episode of bronchospasms ?3/20 PRBC for hgb of 6.6, PSV wean & extubated.  Failed due to global weakness/inability to clear secretions > reintubated.  ?Tracheal aspirate negative normal flora ?3/22 extubated. D/w family and pt trach vs one-way extubation. Pt was non-committal. Was on NIPPV for about 8 hrs then had to be re-intubated ?3/23 vanc and cefepime started. On-going family and pt discussion. He agreed to trach  ?Tracheal aspirate-no growth ?MRSA PCR negative ?03/20/22- TRACH - [name] and [name] ?03/21/22 -status post tracheostomy 40% FiO2 10 L oxygen.  Currently on trach collar.  Overnight was on ventilator.  Also on fentanyl infusion and Precedex infusion.  Nursing reports that he gets very anxious when trying to wean.  Nevertheless he sitting with tracheostomy and typing on the keyboard and asking appropriate questions.  On antibiotics cefepime.  On Jevity tube feeds.  Afebrile since 03/18/2022.  White count is coming  down. ?3/27 refused XRT again today ?3/28 stopped cefepime ?3/30 ATC daytime, Required increased oxygen overnight and placed back on vent ?4/10 Episode of AF/RVR, started back on amiodarone drip ?4/11 PCCM following on floor for trach, episode of hypoxia today, has RUL PNA, started on Cefepime ?4/13 No major issues over the last couple days. He remains on 8L ATC  ?4/17 No respiratory issues over the weekend ? ?Interim History / Subjective:  ?Seen sitting up in bed with no acute complaints other that frustration over prolong hospital course  ? ?Objective   ?Blood pressure 128/83, pulse (!) 105, temperature 98 ?F (36.7 ?C), temperature source Oral, resp. rate 18, height 5\' 11"  (1.803 m), weight 46.4 kg, SpO2 94 %. ?   ?FiO2 (%):  [28 %] 28 %  ? ?Intake/Output Summary (Last 24 hours) at 04/13/2022 1026 ?Last data filed at 04/13/2022 0803 ?Gross per 24 hour  ?Intake 2305 ml  ?Output 2450 ml  ?Net -145 ml  ? ? ?Filed Weights  ? 03/30/22 0500 03/31/22 0500 04/09/22 1220  ?Weight: 41 kg 39.2 kg 46.4 kg  ? ?Physical Exam  ?General: Acute on chronically ill appearing middle aged male lying in bed, in NAD ?HEENT:  6 uncuffed trach midline, [place]/AT, MM pink/moist, PERRL,  ?Neuro: Alert and oriented x3, non-focal  ?CV: s1s2 regular rate and rhythm, no murmur, rubs, or gallops,  ?PULM:  Slightly diminished bilaterally, no increased work of breathing, no added breath sounds  ?GI: soft, bowel sounds active in all 4 quadrants, non-tender, non-distended, tolerating oral diet ?Extremities: warm/dry, no edema  ?Skin: no rashes or lesions ? ?Resolved Hospital Problem list   ?Aspiration pneumonia ? ?Assessment & Plan:  ?  Principal Problem: ?  Respiratory failure ([place]) ?Active Problems: ?  COPD exacerbation ([place]) ?  Anxiety ?  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  Anemia associated with chemotherapy ?  Essential hypertension ?  Metastasis to lymph nodes ([place]) ?  Protein-calorie malnutrition, severe ?  Depression ?  Physical  deconditioning ?  Tracheostomy dependent ([place]) ?  PAF (paroxysmal atrial fibrillation) ([place]) ?  Pneumonia of right upper lobe due to infectious organism ? ?Acute on chronic hypoxemic/hypercarbic respiratory failure from rhinovirus pneumonia with COPD exacerbation  ?Cigarette smoker ?Baseline severe COPD with extensive centrilobular and bullous emphysema  ?Tracheostomy status with some bleeding, resolved ?Pneumonia, RUL  ?-RUL PNA on CXR 4/11, had an episode of desaturation after radiation, on 10L 40%. S/P 5 day course of Cefepime ?Limited stage small cell lung cancer ?P: ?Plan to start a capping trial today after patient returns from XRT ?Can apply supplemental oxygen via [place] if needed once trach capped  ?Continue to encourage pulmonary hygiene  ?Continue oral diuretics  ?XRT per Oncology  ?Will need outpatient hematology/oncology  ? ?Rest of acute and chronic medical conditions managed per primary  ? ? ?Best Practice (right click and "Reselect all SmartList Selections" daily)  ?Per primary  ? ?Signature:   ? ?Syanna Remmert D. Harris, NP-C ?[facility] ?Personal contact information can be found on Amion  ?04/13/2022, 10:26 AM ? ? ? ?

## 2022-04-13 NOTE — Progress Notes (Signed)
HEMATOLOGY-ONCOLOGY PROGRESS NOTE ? ?ASSESSMENT AND PLAN: ?This is a 66 year old male with small cell lung cancer, likely extensive stage (T1c, N1, M1a) disease diagnosed in February 2023 with bilateral nodules in the right upper lobe as well as the left upper lobe but this could also be treated as limited stage disease because of the proximity of the lesions.  He was started on systemic chemotherapy with cisplatin 80 mg per metered squared on day 1 and etoposide 100 mg per metered squared on days 1, 2, and 3 every 3 weeks.  Status post 1 cycle of chemo.  Additional chemotherapy has been placed on hold secondary to prolonged hospitalization. ? ?His respiratory status is slowly improving.  He still has tracheostomy in place and is stable on trach collar.  PCCM considering decannulation.  To begin capping trial today. ? ?He is currently receiving radiation and due to complete this on 4/18.  He was scheduled to see [place] in the office today to discuss plans for future chemotherapy.  However, he remains in the hospital.  Therefore, I have canceled his appointments scheduled for today and have sent a message to reschedule his appointments for approximately 2 weeks.  At that appointment, we will discuss additional chemotherapy. ? ?Recommend outpatient optometry appointment for his blurred vision. ? ?Mikey Bussing, DNP, AGPCNP-BC, AOCNP ? ?SUBJECTIVE: The patient was scheduled to see [place] as an outpatient today in the office.  However, he remains in the hospital.  He continues on radiation and is due to complete this on 4/18.  Tracheostomy remains in place and PCCM planning capping trial today.  Biggest complaint today to me is of blurred vision.  He wants to have an eye exam and is frustrated that he cannot leave the hospital to have this performed.  ? 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 ?  ?02/09/2022  Cancer Staging  ? Staging form: Lung, AJCC 8th Edition ?- Clinical: Stage IVA (cT1c, cN1, cM1a) - Signed by Curt Bears, MD on 02/09/2022 ? ?  ?02/17/2022 -  Chemotherapy  ? Patient is on Treatment Plan : LUNG SMALL CELL Cisplatin D1 + Etoposide D1-3 q21d  ? ?  ?  ? ? ? ?REVIEW OF SYSTEMS:   ?Review of Systems  ?Constitutional:  Negative for chills and fever.  ?HENT: Negative.    ?Eyes:  Positive for blurred vision.  ?Respiratory: Negative.    ?Cardiovascular: Negative.   ?Gastrointestinal: Negative.   ?Genitourinary: Negative.   ?Skin: Negative.   ?Neurological: Negative.   ? ?I have reviewed the past medical history, past surgical history, social history and family history with the patient and they are unchanged from previous note. ? ? ?PHYSICAL EXAMINATION: ?ECOG PERFORMANCE STATUS: 2 - Symptomatic, <50% confined to bed ? ?Vitals:  ? 04/13/22 0908 04/13/22 1135  ?BP:    ?Pulse:  98  ?Resp:  17  ?Temp:    ?SpO2: 94% 92%  ? ?Filed Weights  ? 03/30/22 0500 03/31/22 0500 04/09/22 1220  ?Weight: 41 kg 39.2 kg 46.4 kg  ? ? ?Intake/Output from previous day: ?04/16 0701 - 04/17 0700 ?In: 2305 [NG/GT:1200] ?Out: 2200 [Urine:2200] ? ?Physical Exam ?Vitals reviewed.  ?Constitutional:   ?   General: He is not in acute distress. ?HENT:  ?   Head: Normocephalic.  ?Skin: ?   General: Skin is warm and dry.  ?Neurological:  ?  General: No focal deficit present.  ?   Mental Status: He is alert and oriented to person, place, and time.  ?Psychiatric:     ?   Mood and Affect: Mood normal.     ?   Behavior: Behavior normal.     ?   Thought Content: Thought content normal.     ?   Judgment: Judgment normal.  ? ? ?LABORATORY DATA:  ?I have reviewed the data as listed ? ?  Latest Ref Rng & Units 04/07/2022  ?  4:21 PM 04/06/2022  ?  8:48 AM 04/05/2022  ?  3:56 AM  ?CMP  ?Glucose 70 - 99 mg/dL 140   161   155    ?BUN 8 - 23 mg/dL 17   16   21     ?Creatinine 0.61 - 1.24 mg/dL <0.30   <0.30   0.40    ?Sodium 135 - 145 mmol/L 138   136   136     ?Potassium 3.5 - 5.1 mmol/L 3.5   3.4   3.2    ?Chloride 98 - 111 mmol/L 99   97   94    ?CO2 22 - 32 mmol/L 33   34   34    ?Calcium 8.9 - 10.3 mg/dL 8.2   8.1   8.4    ? ? ?Lab Results  ?Component Value Date  ? WBC 4.6 04/12/2022  ? HGB 7.3 (L) 04/12/2022  ? HCT 23.8 (L) 04/12/2022  ? MCV 98.3 04/12/2022  ? PLT 238 04/12/2022  ? NEUTROABS 4.1 03/25/2022  ? ? ?No results found for: CEA1, CEA, [id number], CA125, PSA1 ? ?DG Chest 1 View ? ?Result Date: 03/16/2022 ?CLINICAL DATA:  COPD, difficulty breathing EXAM: CHEST  1 VIEW COMPARISON:  Previous studies including the examination done earlier today FINDINGS: Cardiac size is within normal limits. Low position of diaphragms suggests COPD. There is interval improvement in aeration in the right lower lung fields, possibly suggesting resolving atelectasis/pneumonia. There is blunting of both lateral CP angles. There is no pneumothorax. Tip of endotracheal tube is proximally 8.2 cm above the carina. Enteric tube is noted traversing the esophagus with its tip in the medial fundus of the stomach. Side-port in the enteric tube is seen in the lower thoracic esophagus. Tip of right IJ chest port is seen in the superior vena cava. IMPRESSION: Severe COPD. There is interval improvement in aeration of right lower lung fields suggesting resolving atelectasis/pneumonia. Small bilateral pleural effusions. Side-port in the enteric tube is seen in the lower thoracic esophagus. Enteric tube could be advanced 5-10 cm to place the side port within the stomach. These results will be called to the ordering clinician or representative by the Radiologist Assistant, and communication documented in the PACS or [employer]. Electronically Signed   By: Elmer Picker M.D.   On: 03/16/2022 16:31  ? ?DG Abd 1 View ? ?Result Date: 03/31/2022 ?CLINICAL DATA:  Encounter for NG tube placement EXAM: ABDOMEN - 1 VIEW COMPARISON:  Xr abdomen 03/20/22 FINDINGS: Partially visualized accessed right  Port-A-Cath. Enteric tube coursing below the hemidiaphragm with tip overlying the expected region of the gastric lumen. The bowel gas pattern is normal. No radio-opaque calculi or other significant radiographic abnormality are seen. Aortic calcification. IMPRESSION: 1. Enteric tube coursing below the hemidiaphragm with tip overlying the expected region of the gastric lumen. 2.  Aortic Atherosclerosis (ICD10-I70.0). Electronically Signed   By: Iven Finn M.D.   On: 03/31/2022 15:32  ? ?  DG Abd 1 View ? ?Result Date: 03/20/2022 ?CLINICAL DATA:  Feeding tube placement. EXAM: ABDOMEN - 1 VIEW COMPARISON:  March 18, 2022. FINDINGS: Distal tip of feeding tube is seen in expected position of distal stomach. IMPRESSION: Distal tip of feeding tube seen in expected position of distal stomach. Electronically Signed   By: Marijo Conception M.D.   On: 03/20/2022 13:18  ? ?DG Abd 1 View ? ?Result Date: 03/18/2022 ?CLINICAL DATA:  Orogastric tube. EXAM: ABDOMEN - 1 VIEW COMPARISON:  Abdominal x-ray 03/16/2022. FINDINGS: Orogastric tube tip is at the gastroesophageal junction. Central venous catheter tip projects over the SVC. No dilated bowel loops in the upper abdomen. IMPRESSION: Orogastric tube tip at the gastroesophageal junction. Recommend advancing tube. Electronically Signed   By: Ronney Asters M.D.   On: 03/18/2022 21:56  ? ?DG Abd 1 View ? ?Result Date: 03/16/2022 ?CLINICAL DATA:  Repositioning of NG tube EXAM: ABDOMEN - 1 VIEW COMPARISON:  Study done earlier today FINDINGS: There is interval repositioning of enteric tube. Tip and side port appear to be in the stomach. Low position of diaphragms suggests COPD. Small bilateral pleural effusions are seen. Increased markings are seen in the right lower lung fields. IMPRESSION: Tip and side port in the enteric tube appear to be in the stomach after repositioning of the enteric tube. Electronically Signed   By: Elmer Picker M.D.   On: 03/16/2022 17:19  ? ?DG Abd 1  View ? ?Result Date: 03/15/2022 ?CLINICAL DATA:  NG tube placement. EXAM: ABDOMEN - 1 VIEW COMPARISON:  03/10/2022. FINDINGS: The bowel gas pattern is normal. The side port of the enteric tube is in the dista

## 2022-04-13 NOTE — Progress Notes (Addendum)
[facility] Progress Note ? ?Mark Yu 66 y.o. 1956/09/13 ? ?CC: Guaiac positive stool, anemia ? ? ?Subjective: ?Patient states he is incredibly frustrated with seeing multiple doctors from multiple specialties.  Patient is not trusted in getting a colonoscopy at this time and would like to focus on his radiation treatment and chemotherapy.  He states he knows he needs one at some point but would like to wait until later on to get this completed .denies any melena/hematochezia.  Denies abdominal pain, nausea, vomiting.  Has some right-sided pleuritic pain that is worse when he coughs and this has been ongoing for few days. ? ?ROS : Review of Systems  ?Respiratory:  Positive for cough.   ?Cardiovascular:  Negative for chest pain and palpitations.  ?Gastrointestinal:  Negative for abdominal pain, blood in stool, constipation, diarrhea, heartburn, melena, nausea and vomiting.   ? ? ?Objective: ?Vital signs in last 24 hours: ?Vitals:  ? 04/13/22 0336 04/13/22 0908  ?BP: 128/83   ?Pulse: (!) 105   ?Resp: 18   ?Temp: 98 ?F (36.7 ?C)   ?SpO2: 97% 94%  ? ? ?Physical Exam: ? ?General:  Alert, cooperative, thin/frail male, pale, fianc? at bedside  ?Head:  Normocephalic, without obvious abnormality, atraumatic.  Tracheostomy  ?Eyes:  Anicteric sclera, EOM's intact, conjunctival pallor  ?Lungs:   Clear to auscultation bilaterally, respirations unlabored  ?Heart:  Regular rate and rhythm, S1, S2 normal  ?Abdomen:   Soft, non-tender, bowel sounds active all four quadrants,  no masses,   ? ? ?Lab Results: ?No results for input(s): NA, K, CL, CO2, GLUCOSE, BUN, CREATININE, CALCIUM, MG, PHOS in the last 72 hours. ?No results for input(s): AST, ALT, ALKPHOS, BILITOT, PROT, ALBUMIN in the last 72 hours. ?Recent Labs  ?  04/10/22 ?1114 04/12/22 ?0359  ?WBC 8.2 4.6  ?HGB 7.3* 7.3*  ?HCT 23.8* 23.8*  ?MCV 97.5 98.3  ?PLT 298 238  ? ?No results for input(s): LABPROT, INR in the last 72 hours. ? ? ? ?Assessment ?Guaiac  positive stool, anemia ?-Hgb 7.3, stable (baseline around 9). Normal MCV. ?-BUN 17, creatinine <0.30 ?-PET scan from January showed hypermetabolic area of his proximal colon ? ?COPD exacerbation ? ?Right upper lobe pneumonia ?-Was treated with cefepime, last day was yesterday 4/16 ? ?Small cell carcinoma ?-Currently receiving radiation therapy 4/17 ? ?Plan: ?Discussed with patient about next steps including possibility of doing colonoscopy and/or Flex sigmoidoscopy.  Patient states he would rather hold off on any procedures at this time as he would prefer to finish radiation and start chemotherapy.  He states he acknowledges he needs a colonoscopy at sometime in the near future but is not interested in getting it done in the next few days. ?Continue to monitor hemoglobin.  Normocytic anemia with baseline typically around 9 (hemoglobin currently at 7.3).  Guaiac positivity could be from recently inserted PEG tube.  No overt bleeding at this time. ?Continue daily CBC and transfuse as needed to maintain HGB > 7  ?[facility] will sign off. Please contact us if we can be of any further assistance during this hospital stay.  ? ?Garnette Scheuermann PA-C ?04/13/2022, 10:05 AM ? ?Contact #  [phone]  ?

## 2022-04-13 NOTE — Progress Notes (Signed)
Per CCM order- trach capped. PT was placed on 4 LPM nasal cannula (titrated down), suctioned, red capped placed at approximately 1520. PT tolerating well at this time. RN aware. ?

## 2022-04-13 NOTE — Progress Notes (Signed)
PT Cancellation Note ? ?Patient Details ?Name: Mark Yu ?MRN: [id number] ?DOB: Mar 28, 1956 ? ? ?Cancelled Treatment:    Reason Eval/Treat Not Completed: Patient at procedure or test/unavailable, going to radiation, then will have trach plugged. Will check back in AM. ?Tresa Endo PT ?Acute Rehabilitation Services ?Pager [phone] ?Office [phone] ? ? ? ?Darice Vicario, Shella Maxim ?04/13/2022, 2:44 PM ?

## 2022-04-14 ENCOUNTER — Inpatient Hospital Stay: Payer: Medicare Other

## 2022-04-14 ENCOUNTER — Other Ambulatory Visit: Payer: Self-pay

## 2022-04-14 ENCOUNTER — Encounter: Payer: Self-pay | Admitting: Radiation Oncology

## 2022-04-14 ENCOUNTER — Ambulatory Visit
Admission: RE | Admit: 2022-04-14 | Discharge: 2022-04-14 | Disposition: A | Discharge status: Home / Self Care | Payer: Medicare Other | Source: Ambulatory Visit | Attending: Radiation Oncology | Admitting: Radiation Oncology

## 2022-04-14 DIAGNOSIS — J9601 Acute respiratory failure with hypoxia: Secondary | ICD-10-CM | POA: Diagnosis not present

## 2022-04-14 DIAGNOSIS — J441 Chronic obstructive pulmonary disease with (acute) exacerbation: Secondary | ICD-10-CM | POA: Diagnosis not present

## 2022-04-14 DIAGNOSIS — C3412 Malignant neoplasm of upper lobe, left bronchus or lung: Secondary | ICD-10-CM | POA: Diagnosis not present

## 2022-04-14 DIAGNOSIS — F419 Anxiety disorder, unspecified: Secondary | ICD-10-CM | POA: Diagnosis not present

## 2022-04-14 DIAGNOSIS — D6481 Anemia due to antineoplastic chemotherapy: Secondary | ICD-10-CM | POA: Diagnosis not present

## 2022-04-14 LAB — RAD ONC ARIA SESSION SUMMARY
Course Elapsed Days: 56
Plan Fractions Treated to Date: 3
Plan Prescribed Dose Per Fraction: 2 Gy
Plan Total Fractions Prescribed: 3
Plan Total Prescribed Dose: 6 Gy
Reference Point Dosage Given to Date: 66 Gy
Reference Point Session Dosage Given: 2 Gy
Session Number: 33

## 2022-04-14 LAB — GLUCOSE, CAPILLARY
Glucose-Capillary: 140 mg/dL — ABNORMAL HIGH (ref 70–99)
Glucose-Capillary: 123 mg/dL — ABNORMAL HIGH (ref 70–99)

## 2022-04-14 MED ORDER — GUAIFENESIN 100 MG/5ML PO LIQD
20.0000 mL | Freq: Two times a day (BID) | ORAL | Status: DC
  Administered 2022-04-14 – 2022-04-17 (×6): 20 mL
  Filled 2022-04-14 (×6): qty 20

## 2022-04-14 MED ORDER — GUAIFENESIN-DM 100-10 MG/5ML PO SYRP
ORAL_SOLUTION | ORAL | Status: AC
  Filled 2022-04-14: qty 20

## 2022-04-14 MED ORDER — GUAIFENESIN 100 MG/5ML PO LIQD: 20.0000 mL | Freq: Two times a day (BID) | ORAL | Status: DC

## 2022-04-14 NOTE — Progress Notes (Signed)
? ?NAME:  Mark Yu, MRN:  [id number], DOB:  Apr 14, 1956, LOS: 1 ?ADMISSION DATE:  03/08/2022, CONSULTATION DATE:  3/14 ?REFERRING MD:  [place], CHIEF COMPLAINT:  Dyspnea  ? ?History of Present Illness:  ?66 y/o male presented with limited stage small cell lung cancer and COPD admitted with acute on chronic respiratory failure with hypoxemia due to rhinovirus requiring intubation on 3/14.  He eventually required tracheostomy  ? ?Pertinent  Medical History  ?Severe COPD with emphysema, Depression, Anxiety, HTN, extensive stage SCLC ?Concurrent systemic chemotherapy and radiation treatment ?Rhinovirus infection ? ?Significant Hospital Events: ?Including procedures, antibiotic start and stop dates in addition to other pertinent events   ?3/12 Admit with dyspnea, AECOPD in setting of rhinovirus  ?3/14 PCCM consulted for evaluation of respiratory failure. Intubated for airway protection  ?3/17 On vent, XRT  ?3/18 tolerating pressure support today ?3/19 not weaning as well today with every episode of bronchospasms ?3/20 PRBC for hgb of 6.6, PSV wean & extubated.  Failed due to global weakness/inability to clear secretions > reintubated.  ?Tracheal aspirate negative normal flora ?3/22 extubated. D/w family and pt trach vs one-way extubation. Pt was non-committal. Was on NIPPV for about 8 hrs then had to be re-intubated ?3/23 vanc and cefepime started. On-going family and pt discussion. He agreed to trach  ?Tracheal aspirate-no growth ?MRSA PCR negative ?03/20/22- TRACH - [name] and [name] ?03/21/22 -status post tracheostomy 40% FiO2 10 L oxygen.  Currently on trach collar.  Overnight was on ventilator.  Also on fentanyl infusion and Precedex infusion.  Nursing reports that he gets very anxious when trying to wean.  Nevertheless he sitting with tracheostomy and typing on the keyboard and asking appropriate questions.  On antibiotics cefepime.  On Jevity tube feeds.  Afebrile since 03/18/2022.  White count is coming  down. ?3/27 refused XRT again today ?3/28 stopped cefepime ?3/30 ATC daytime, Required increased oxygen overnight and placed back on vent ?4/10 Episode of AF/RVR, started back on amiodarone drip ?4/11 PCCM following on floor for trach, episode of hypoxia today, has RUL PNA, started on Cefepime ?4/13 No major issues over the last couple days. He remains on 8L ATC  ?4/17 No respiratory issues over the weekend, capping trial started ?4/18 tolerated capping trial well overnight ? ?Interim History / Subjective:  ?Seen sitting up in bed with no acute complaints states he feels well this a.m. and "likes" trach ? ?Objective   ?Blood pressure 125/65, pulse 89, temperature 97.6 ?F (36.4 ?C), temperature source Oral, resp. rate (!) 24, height 5\' 11"  (1.803 m), weight 46.4 kg, SpO2 99 %. ?   ?FiO2 (%):  [28 %-32 %] 32 %  ? ?Intake/Output Summary (Last 24 hours) at 04/14/2022 0844 ?Last data filed at 04/14/2022 0600 ?Gross per 24 hour  ?Intake 1280 ml  ?Output 1350 ml  ?Net -70 ml  ? ? ?Filed Weights  ? 03/30/22 0500 03/31/22 0500 04/09/22 1220  ?Weight: 41 kg 39.2 kg 46.4 kg  ? ?Physical Exam  ?General: Acute on chronic deconditioned cachectic middle-aged male lying in bed in no acute distress ?HEENT: 6 uncuffed trach midline, MM pink/moist, PERRL,  ?Neuro: Alert and oriented x3, nonfocal ?CV: s1s2 regular rate and rhythm, no murmur, rubs, or gallops,  ?PULM: Clear to auscultation bilaterally, no increased work of breathing, no added breath sounds, oxygen saturations mid to upper 90s on 3 L nasal cannula ?GI: soft, bowel sounds active in all 4 quadrants, non-tender, non-distended, tolerating TF ?Extremities: warm/dry, no edema  ?Skin:  no rashes or lesions ? ? ?Resolved Hospital Problem list   ?Aspiration pneumonia ? ?Assessment & Plan:  ?Principal Problem: ?  Respiratory failure ([place]) ?Active Problems: ?  COPD exacerbation ([place]) ?  Anxiety ?  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  Anemia associated with  chemotherapy ?  Essential hypertension ?  Metastasis to lymph nodes ([place]) ?  Protein-calorie malnutrition, severe ?  Depression ?  Physical deconditioning ?  Tracheostomy dependent ([place]) ?  PAF (paroxysmal atrial fibrillation) ([place]) ?  Pneumonia of right upper lobe due to infectious organism ? ?Acute on chronic hypoxemic/hypercarbic respiratory failure from rhinovirus pneumonia with COPD exacerbation  ?Cigarette smoker ?Baseline severe COPD with extensive centrilobular and bullous emphysema  ?Tracheostomy status with some bleeding, resolved ?Pneumonia, RUL  ?-RUL PNA on CXR 4/11, had an episode of desaturation after radiation, on 10L 40%. S/P 5 day course of Cefepime ?Limited stage small cell lung cancer ?P: ?Capping trial started 4/17 and 1500, patient has tolerated well since application ?We will discuss with attending timing for capping trial, likely goal of 48 hours prior to decannulation ?Continue supplemental oxygen via nasal cannula with SPO2 goal greater than 92 ?Mobilize as able ?PT/OT evaluations ?Continued oral diuretics ?Encourage pulmonary hygiene ? ?Rest of acute and chronic medical conditions managed per primary  ? ? ?Best Practice (right click and "Reselect all SmartList Selections" daily)  ?Per primary  ? ?Signature:   ? ?Para Cossey D. Harris, NP-C ?[facility] ?Personal contact information can be found on Amion  ?04/14/2022, 8:44 AM ? ? ? ?

## 2022-04-14 NOTE — Progress Notes (Signed)
Patient is tolerating being capped with the red cap and on 4lpm [place]. RT will continue to monitor ?

## 2022-04-14 NOTE — Progress Notes (Signed)
Occupational Therapy Treatment ?Patient Details ?Name: Mark Yu ?MRN: [id number] ?DOB: Dec 18, 1956 ?Today's Date: 04/14/2022 ? ? ?History of present illness 66 yo male smoker presented to [facility] with dyspnea.   Found to have rhinovirus, pna, COPD exacerbation. had progressive hypoxia and transferred to SDU 3/13 and started on Bipap.  Respiratory status and mental status worsened,  intubated on 3/14, Ext 3/22 and re-intubated, trach 3/24.  PMH: COPD with emphysema and recent diagnosis of extensive stage SCLC on concurrent chemoradiation therapy. ?  ?OT comments ? Patient was noted to show improvement with donning clothing at bed level with donning socks sitting cross legged in bed with no support with min guard and increased time. Patient was able to bridge in bed with donning pants with increased time and min A. Patient was able to participate in taking a few steps with noted bilateral knee buckling. Patient's discharge plan remains appropriate at this time. OT will continue to follow acutely.  ?  ? ?Recommendations for follow up therapy are one component of a multi-disciplinary discharge planning process, led by the attending physician.  Recommendations may be updated based on patient status, additional functional criteria and insurance authorization. ?   ?Follow Up Recommendations ? Skilled nursing-short term rehab (<3 hours/day)  ?  ?Assistance Recommended at Discharge Frequent or constant Supervision/Assistance  ?Patient can return home with the following ? A lot of help with walking and/or transfers;A lot of help with bathing/dressing/bathroom;Assistance with cooking/housework;Assist for transportation;Help with stairs or ramp for entrance ?  ?Equipment Recommendations ? Other (comment) (defer to next venue)  ?  ?Recommendations for Other Services   ? ?  ?Precautions / Restrictions Precautions ?Precautions: Fall ?Precaution Comments: T-collar, PEG,, multiple lines and leads/monitor VS, ?Restrictions ?Weight  Bearing Restrictions: No  ? ? ?  ? ?Mobility Bed Mobility ?Overal bed mobility: Needs Assistance ?  ?  ?  ?Supine to sit: Min guard ?  ?  ?General bed mobility comments: with increased time ?  ? ?Transfers ?  ?  ?  ?  ?  ?  ?  ?  ?  ?  ?  ?  ?Balance Overall balance assessment: Needs assistance ?Sitting-balance support: Feet supported ?Sitting balance-Leahy Scale: Good ?  ?  ?Standing balance support: Bilateral upper extremity supported, During functional activity, Reliant on assistive device for balance ?Standing balance-Leahy Scale: Poor ?Standing balance comment: knees were noted to have some buckling with attempts at weight shifting ?  ?  ?  ?  ?  ?  ?  ?  ?  ?  ?  ?   ? ?ADL either performed or assessed with clinical judgement  ? ?ADL Overall ADL's : Needs assistance/impaired ?  ?  ?  ?  ?  ?  ?  ?  ?  ?  ?Lower Body Dressing: Set up;Bed level ?Lower Body Dressing Details (indicate cue type and reason): to don socks sitting crosslegged in bed. patient was able to don pants with min guard and bridging at bed level. min A to adjust in standing. ?  ?  ?  ?  ?  ?  ?  ?General ADL Comments: patient participated in standing and taking few functional steps in RW with mod A x2 with noted bilateral knee buckling to work on Librarian, academic for transfers. patient needed encouragement to continue to participate in tasks. patient was noted to have O2 maintain in 90s durign session. HR increased to 125 bpm after last attempt at taking steps. ?  ? ?Extremity/Trunk  Assessment   ?  ?  ?  ?  ?  ? ?Vision   ?  ?  ?Perception   ?  ?Praxis   ?  ? ?Cognition Arousal/Alertness: Awake/alert ?Behavior During Therapy: [facility] for tasks assessed/performed ?Overall Cognitive Status: Within Functional Limits for tasks assessed ?  ?  ?  ?  ?  ?  ?  ?  ?  ?  ?  ?  ?  ?  ?  ?  ?General Comments: patient was agreeable to participation in session. ?  ?  ?   ?Exercises   ? ?  ?Shoulder Instructions   ? ? ?  ?General Comments    ? ? ?Pertinent Vitals/  Pain       Pain Assessment ?Pain Assessment: Faces ?Faces Pain Scale: Hurts little more ?Pain Location: R side ribs ?Pain Descriptors / Indicators: Discomfort, Grimacing ?Pain Intervention(s): Monitored during session, Patient requesting pain meds-RN notified ? ?Home Living   ?  ?  ?  ?  ?  ?  ?  ?  ?  ?  ?  ?  ?  ?  ?  ?  ?  ?  ? ?  ?Prior Functioning/Environment    ?  ?  ?  ?   ? ?Frequency ? Min 2X/week  ? ? ? ? ?  ?Progress Toward Goals ? ?OT Goals(current goals can now be found in the care plan section) ? Progress towards OT goals: Progressing toward goals ? ?   ?Plan Discharge plan remains appropriate   ? ?Co-evaluation ? ? ? PT/OT/SLP Co-Evaluation/Treatment: Yes ?Reason for Co-Treatment: To address functional/ADL transfers;For patient/therapist safety ?PT goals addressed during session: Mobility/safety with mobility ?OT goals addressed during session: ADL's and self-care ?  ? ?  ?AM-PAC OT "6 Clicks" Daily Activity     ?Outcome Measure ? ? Help from another person eating meals?: Total (NPO) ?Help from another person taking care of personal grooming?: A Little ?Help from another person toileting, which includes using toliet, bedpan, or urinal?: A Lot ?Help from another person bathing (including washing, rinsing, drying)?: A Lot ?Help from another person to put on and taking off regular upper body clothing?: A Little ?Help from another person to put on and taking off regular lower body clothing?: A Little ?6 Click Score: 14 ? ?  ?End of Session Equipment Utilized During Treatment: Oxygen;Gait belt;Rolling walker (2 wheels) ? ?OT Visit Diagnosis: Muscle weakness (generalized) (M62.81);Other abnormalities of gait and mobility (R26.89) ?  ?Activity Tolerance Patient tolerated treatment well ?  ?Patient Left in chair;with call bell/phone within reach;Other (comment) (respiratory therapist in room) ?  ?Nurse Communication Other (comment);Patient requests pain meds (ok to participate) ?  ? ?   ? ?Time: 2952-8413 ?OT  Time Calculation (min): 42 min ? ?Charges: OT General Charges ?$OT Visit: 1 Visit ?OT Treatments ?$Self Care/Home Management : 8-22 mins ? ?Cherylyn Sundby OTR/L, MS ?Acute Rehabilitation Department ?Office# [phone] ?Pager# [phone] ? ? ?[place] ?04/14/2022, 4:02 PM ?

## 2022-04-14 NOTE — Progress Notes (Signed)
? ?PROGRESS NOTE ? ? ? ?Mark Yu  ION:[id number] DOB: 13-Sep-1956 DOA: 03/08/2022 ?PCP: [name], MD ? ? ?Brief Narrative: ?20 yom w/ COPD not on home O2, HTN, and metastatic SCLC dx/d Feb 2023 on cisplatin/etop who presented with SOB, wheezing for 3 days, got severe. In thE ER, tachypneic to 20s, having severe SOB, SpO2 86% on room air. ?3/12: admitted  for Ae of COPD, CTA  No PE, new infiltrates, Rhinovirus+ **CODE BLUE** in PM after returning from RadTx, walked to bathroom without, found gray and agonal immediately after, bagged and transferred to [facility] on BiPAP ?3/13: Initially improved mentation and ABG on BiPAP but in AM again nearly unresponsive, again tachypneic to 30s, intubated. Had complicated hospital course with respiratory failure persistent w/ hx of COPD now s/p trach 3/24- weaning> on TC. PCCM following for downsizing and perhaps decannulation.  Patient is getting daily XRT for concurrent lung cancer which seems to be helping his pulmonary status overall.Evaluated by [facility] but denied by insurance.  With ongoing dysphagia speech recommended PEG tube placement 4/7-PEG tube feeding started 04/04/22 evening ? ? ?Assessment and Plan: ? ?Acute on chronic respiratory failure with hypoxia and hypercapnia ?COPD exacerbation ?Centrilobular and bullous emphysema ?S/p tracheostomy ?Positive for rhinovirus, exacerbating COPD. Patient required ICU admission with intubation on 3/14 and subsequent tracheostomy on 3/24. Currently stable on trach collar with consideration to attempt decannulation prior to discharge. Capping trial. ?-Continue oxygen ?-Continue Brovana and Brovana ?-Continue albuterol PRN ?-Pulmonology recommendations: capping trial 4/17> ? ?Right upper lobe pneumonia ?Newly diagnosed on 4/11 chest x-ray. Started on empiric Cefepime. Negative MRSA swab. Pulmonology recommendations for 5 days of treatment. Completed treatment. ? ?Small cell carcinoma ?Metastasis to lymph nodes ?Patient  currently receiving radiation therapy through 4/18. Medical oncology with plans for outpatient follow-up. ? ?Anemia of chronic disease ?Also associated with chemotherapy. Baseline hemoglobin around 9. Drift down to 7.3. No obvious evidence of acute hemorrhage. FOBT positive. Normal BUN. On chart review, PET scan from January showed a hypermetabolic area of his proximal colon. Patient reports not ever having a colonoscopy. Hemoglobin has stabilized. Lovenox discontinued on 4/13. [facility] consulted (4/16) and recommend no inpatient workup. ? ?Transient atrial fibrillation with RVR ?Patient received amiodarone with resolution of atrial fibrillation. No anticoagulation started. ? ?Primary hypertension ?-Continue amlodipine ? ?Anxiety ?Depression ?-Continue Xanax PRN ? ?Severe protein-calorie malnutrition ?Patient is s/p PEG tube placed on 4/7 ? ?Gross pharyngo-cervical esophageal dysphagia ?Evaluated by speech therapy with recommendations for alternative nutrition. Patient is s/p PEG tube as mentioned above. ? ?Hyponatremia ?Resolved ? ?Hypokalemia ?Resolved. ? ?Pressure injury ?Medial vertebral column. Not present on admission. ? ? ?DVT prophylaxis: SCDs ?Code Status:   Code Status: Full Code ?Family Communication: Fiance at bedside ?Disposition Plan: Discharge to SNF likely in 1-3 days pending pulmonology recommendations and possible decannulation in addition to bed availability ? ? ?Consultants:  ?PCCM ?Radiation oncology ?[facility] gastroenterology ? ?Procedures:  ?Intubation/extubation ?Tracheostomy ?PEG tube placement ? ?Antimicrobials: ?Vancomycin ?Azithromycin ?Cefepime ? ? ?Subjective: ?No issues noted. ? ?Objective: ?BP 125/65 (BP Location: Right Arm)   Pulse 89   Temp 97.6 ?F (36.4 ?C) (Oral)   Resp (!) 24   Ht 5\' 11"  (1.803 m)   Wt 46.4 kg   SpO2 99%   BMI 14.27 kg/m?  ? ?Examination: ? ?General exam: Appears calm and comfortable ?Respiratory system: Diffuse rhonchi auscultation. Respiratory effort  normal. ?Cardiovascular system: S1 & S2 heard, RRR. No murmurs, rubs, gallops or clicks. ?Gastrointestinal system: Abdomen is nondistended,  soft and nontender. Decreased bowel sounds heard. ?Central nervous system: Alert and oriented. No focal neurological deficits. ?Musculoskeletal: No edema. No calf tenderness ?Psychiatry: Judgement and insight appear normal. Mood & affect appropriate.   ? ? ?Data Reviewed: I have personally reviewed following labs and imaging studies ? ?CBC ?Lab Results  ?Component Value Date  ? WBC 4.6 04/12/2022  ? RBC 2.42 (L) 04/12/2022  ? HGB 7.3 (L) 04/12/2022  ? HCT 23.8 (L) 04/12/2022  ? MCV 98.3 04/12/2022  ? MCH 30.2 04/12/2022  ? PLT 238 04/12/2022  ? MCHC 30.7 04/12/2022  ? RDW 16.2 (H) 04/12/2022  ? LYMPHSABS 0.4 (L) 03/25/2022  ? MONOABS 0.9 03/25/2022  ? EOSABS 0.0 03/25/2022  ? BASOSABS 0.0 03/25/2022  ? ? ? ?Last metabolic panel ?Lab Results  ?Component Value Date  ? NA 138 04/07/2022  ? K 3.5 04/07/2022  ? CL 99 04/07/2022  ? CO2 33 (H) 04/07/2022  ? BUN 17 04/07/2022  ? CREATININE <0.30 (L) 04/07/2022  ? GLUCOSE 140 (H) 04/07/2022  ? GFRNONAA NOT CALCULATED 04/07/2022  ? CALCIUM 8.2 (L) 04/07/2022  ? PHOS 4.0 04/04/2022  ? PROT 7.4 03/27/2022  ? ALBUMIN 2.9 (L) 03/27/2022  ? BILITOT 0.2 (L) 03/27/2022  ? ALKPHOS 69 03/27/2022  ? AST 17 03/27/2022  ? ALT 20 03/27/2022  ? ANIONGAP 6 04/07/2022  ? ? ?GFR: ?CrCl cannot be calculated (This lab value cannot be used to calculate CrCl because it is not a number: <0.30). ? ?Recent Results (from the past 240 hour(s))  ?MRSA Next Gen by PCR, Nasal     Status: None  ? Collection Time: 04/07/22  3:48 PM  ? Specimen: Nasal Mucosa; Nasal Swab  ?Result Value Ref Range Status  ? MRSA by PCR Next Gen NOT DETECTED NOT DETECTED Final  ?  Comment: (NOTE) ?The GeneXpert MRSA Assay (FDA approved for NASAL specimens only), ?is one component of a comprehensive MRSA colonization surveillance ?program. It is not intended to diagnose MRSA infection nor to  guide ?or monitor treatment for MRSA infections. ?Test performance is not FDA approved in patients less than 2 years ?old. ?Performed at [facility], [address] ?[place], [place] [address] ?  ?  ? ? ?Radiology Studies: ?No results found. ? ? ? LOS: 36 days  ? ? ?Cordelia Poche, MD ?[facility] ?04/14/2022, 8:34 AM ? ? ?If 7PM-7AM, please contact night-coverage ?[email or web address] ? ?

## 2022-04-14 NOTE — Progress Notes (Signed)
Physical Therapy Treatment ?Patient Details ?Name: Mark Yu ?MRN: [id number] ?DOB: 04-01-1956 ?Today's Date: 04/14/2022 ? ? ?History of Present Illness 66 yo male smoker presented to [facility] with dyspnea.   Found to have rhinovirus, pna, COPD exacerbation. had progressive hypoxia and transferred to SDU 3/13 and started on Bipap.  Respiratory status and mental status worsened,  intubated on 3/14, Ext 3/22 and re-intubated, trach 3/24.  PMH: COPD with emphysema and recent diagnosis of extensive stage SCLC on concurrent chemoradiation therapy. ? ?  ?PT Comments  ? ? Patient requires encouragement and redirection to maintain focus on tasks during session. Pt able to sit up to EOB with min guard and no assist. Sit<>stand completed with Min-Mod +2 depending on surface elevation. He was able to initiate short bouts of gait today with +2 Mod assist to stabilize knees in stance phase and facilitate extension. Overall he remains motivated and willing to work with therapy. Will continue to progress pt as able. ?  ?Recommendations for follow up therapy are one component of a multi-disciplinary discharge planning process, led by the attending physician.  Recommendations may be updated based on patient status, additional functional criteria and insurance authorization. ? ?Follow Up Recommendations ? PT at Long-term acute care hospital ?  ?  ?Assistance Recommended at Discharge Frequent or constant Supervision/Assistance  ?Patient can return home with the following Help with stairs or ramp for entrance;Assistance with feeding;Assist for transportation;Assistance with cooking/housework;Two people to help with walking and/or transfers;A little help with bathing/dressing/bathroom;Direct supervision/assist for medications management ?  ?Equipment Recommendations ? None recommended by PT  ?  ?Recommendations for Other Services   ? ? ?  ?Precautions / Restrictions Precautions ?Precautions: Fall ?Precaution Comments: T-collar(capped),  PEG,, multiple lines and leads/monitor VS, ?Restrictions ?Weight Bearing Restrictions: No  ?  ? ?Mobility ? Bed Mobility ?Overal bed mobility: Needs Assistance ?Bed Mobility: Supine to Sit ?  ?  ?Supine to sit: Min guard, HOB elevated ?  ?  ?General bed mobility comments: pt taking extra time ?  ? ?Transfers ?Overall transfer level: Needs assistance ?Equipment used: Rolling walker (2 wheels) ?Transfers: Sit to/from Stand ?Sit to Stand: Min assist, +2 physical assistance, +2 safety/equipment, From elevated surface ?  ?  ?  ?  ?  ?General transfer comment: +2 Min assist for power up. pt requires cues for bil UE use at EOB to initaite. completed from EOB with min assist and lower recliner height with Mod assist. ?  ? ?Ambulation/Gait ?Ambulation/Gait assistance: Mod assist, +2 safety/equipment ?  ?Assistive device: Rolling walker (2 wheels) ?Gait Pattern/deviations: Step-to pattern, Decreased stride length, Knees buckling, Trunk flexed, Narrow base of support ?Gait velocity: decr ?  ?  ?General Gait Details: pt ambulated 2 small/short bouts of forward gait with 2+ mod assist to manage walker and facilitate knee extension in stance with manual blocking during gait. ambulated ~4' and then 8' with RW. On second bout therapist faciliated step length/foot placement as well as knee extension. ? ? ?Stairs ?  ?  ?  ?  ?  ? ? ?Wheelchair Mobility ?  ? ?Modified Rankin (Stroke Patients Only) ?  ? ? ?  ?Balance Overall balance assessment: Needs assistance ?Sitting-balance support: Feet supported ?Sitting balance-Leahy Scale: Good ?  ?  ?Standing balance support: Bilateral upper extremity supported, During functional activity, Reliant on assistive device for balance ?Standing balance-Leahy Scale: Poor ?Standing balance comment: knees were noted to have some buckling with attempts at weight shifting ?  ?  ?  ?  ?  ?  ?  ?  ?  ?  ?  ?  ? ?  ?  Cognition Arousal/Alertness: Awake/alert ?Behavior During Therapy: [facility] for tasks  assessed/performed ?Overall Cognitive Status: Within Functional Limits for tasks assessed ?  ?  ?  ?  ?  ?  ?  ?  ?  ?  ?  ?  ?  ?  ?  ?  ?  ?  ?  ? ?  ?Exercises   ? ?  ?General Comments   ?  ?  ? ?Pertinent Vitals/Pain Pain Assessment ?Pain Assessment: Faces ?Faces Pain Scale: Hurts little more ?Pain Location: Rt ribs ?Pain Descriptors / Indicators: Discomfort, Grimacing ?Pain Intervention(s): Limited activity within patient's tolerance, Monitored during session, Repositioned  ? ? ?Home Living   ?  ?  ?  ?  ?  ?  ?  ?  ?  ?   ?  ?Prior Function    ?  ?  ?   ? ?PT Goals (current goals can now be found in the care plan section) Acute Rehab PT Goals ?Patient Stated Goal: get stronger ?PT Goal Formulation: With patient ?Time For Goal Achievement: 04/21/22 ?Potential to Achieve Goals: Fair ?Progress towards PT goals: Progressing toward goals ? ?  ?Frequency ? ? ? Min 2X/week ? ? ? ?  ?PT Plan Current plan remains appropriate  ? ? ?Co-evaluation PT/OT/SLP Co-Evaluation/Treatment: Yes ?Reason for Co-Treatment: For patient/therapist safety;To address functional/ADL transfers ?PT goals addressed during session: Mobility/safety with mobility;Balance;Proper use of DME ?OT goals addressed during session: ADL's and self-care ?  ? ?  ?AM-PAC PT "6 Clicks" Mobility   ?Outcome Measure ? Help needed turning from your back to your side while in a flat bed without using bedrails?: A Little ?Help needed moving from lying on your back to sitting on the side of a flat bed without using bedrails?: A Little ?Help needed moving to and from a bed to a chair (including a wheelchair)?: A Lot ?Help needed standing up from a chair using your arms (e.g., wheelchair or bedside chair)?: A Lot ?Help needed to walk in hospital room?: Total ?Help needed climbing 3-5 steps with a railing? : Total ?6 Click Score: 12 ? ?  ?End of Session Equipment Utilized During Treatment: Oxygen ?Activity Tolerance: Patient tolerated treatment well ?Patient left: in  chair;with call bell/phone within reach;with chair alarm set ?Nurse Communication: Mobility status ?PT Visit Diagnosis: Other abnormalities of gait and mobility (R26.89);Difficulty in walking, not elsewhere classified (R26.2);Muscle weakness (generalized) (M62.81) ?  ? ? ?Time: 9758-8325 ?PT Time Calculation (min) (ACUTE ONLY): 44 min ? ?Charges:  $Gait Training: 8-22 mins ?$Therapeutic Activity: 8-22 mins          ?          ? ?Mark Yu PT, DPT ?Acute Rehabilitation Services ?Office [phone] ?Pager [phone]  ? ? ?Mark Yu ?04/14/2022, 4:25 PM ? ?

## 2022-04-14 NOTE — TOC Progression Note (Signed)
Transition of Care (TOC) - Progression Note  ? ? ?Patient Details  ?Name: Mark Yu ?MRN: [id number] ?Date of Birth: 1956/04/27 ? ?Transition of Care (TOC) CM/SW Contact  ?Purcell Mouton, RN ?Phone Number: ?04/14/2022, 4:11 PM ? ?Clinical Narrative:    ? ?Spoke with pt who selected [facility] for SNF.  AC states SNF will not take a pt with a new trach for 30 days. TOC will continue to  ?Follow for discharge. CM spoke with [name] with bed offers. Pt asked that CM speak with Stephaine on his contact list.  ? ?Expected Discharge Plan: Home/Self Care ?Barriers to Discharge: Insurance Authorization ? ?Expected Discharge Plan and Services ?Expected Discharge Plan: Home/Self Care ?  ?Discharge Planning Services: CM Consult ?  ?Living arrangements for the past 2 months: [place] ?                ?  ?  ?  ?  ?  ?  ?  ?  ?  ?  ? ? ?Social Determinants of Health (SDOH) Interventions ?  ? ?Readmission Risk Interventions ? ?  03/09/2022  ? 12:52 PM  ?Readmission Risk Prevention Plan  ?Transportation Screening Complete  ?PCP or Specialist Appt within 3-5 Days Complete  ?[place] or Home Care Consult Complete  ?Social Work Consult for [place] Planning/Counseling Complete  ?Medication Review Press photographer) Complete  ? ? ?

## 2022-04-15 ENCOUNTER — Inpatient Hospital Stay: Payer: Medicare Other

## 2022-04-15 DIAGNOSIS — J441 Chronic obstructive pulmonary disease with (acute) exacerbation: Secondary | ICD-10-CM | POA: Diagnosis not present

## 2022-04-15 DIAGNOSIS — I10 Essential (primary) hypertension: Secondary | ICD-10-CM | POA: Diagnosis not present

## 2022-04-15 DIAGNOSIS — D6481 Anemia due to antineoplastic chemotherapy: Secondary | ICD-10-CM | POA: Diagnosis not present

## 2022-04-15 DIAGNOSIS — J9621 Acute and chronic respiratory failure with hypoxia: Secondary | ICD-10-CM | POA: Diagnosis not present

## 2022-04-15 LAB — IRON AND TIBC
Iron: 30 ug/dL — ABNORMAL LOW (ref 45–182)
Saturation Ratios: 12 % — ABNORMAL LOW (ref 17.9–39.5)
TIBC: 242 ug/dL — ABNORMAL LOW (ref 250–450)
UIBC: 212 ug/dL

## 2022-04-15 LAB — GLUCOSE, CAPILLARY
Glucose-Capillary: 142 mg/dL — ABNORMAL HIGH (ref 70–99)
Glucose-Capillary: 107 mg/dL — ABNORMAL HIGH (ref 70–99)

## 2022-04-15 LAB — CBC
HCT: 22.9 % — ABNORMAL LOW (ref 39.0–52.0)
Hemoglobin: 7.1 g/dL — ABNORMAL LOW (ref 13.0–17.0)
MCH: 30.5 pg (ref 26.0–34.0)
MCHC: 31 g/dL (ref 30.0–36.0)
MCV: 98.3 fL (ref 80.0–100.0)
Platelets: 218 10*3/uL (ref 150–400)
RBC: 2.33 MIL/uL — ABNORMAL LOW (ref 4.22–5.81)
RDW: 16.2 % — ABNORMAL HIGH (ref 11.5–15.5)
WBC: 5.1 10*3/uL (ref 4.0–10.5)
nRBC: 0 % (ref 0.0–0.2)

## 2022-04-15 LAB — BASIC METABOLIC PANEL
Anion gap: 5 (ref 5–15)
BUN: 20 mg/dL (ref 8–23)
CO2: 36 mmol/L — ABNORMAL HIGH (ref 22–32)
Calcium: 8.7 mg/dL — ABNORMAL LOW (ref 8.9–10.3)
Chloride: 97 mmol/L — ABNORMAL LOW (ref 98–111)
Creatinine, Ser: 0.32 mg/dL — ABNORMAL LOW (ref 0.61–1.24)
GFR, Estimated: >60 mL/min (ref >60)
Glucose, Bld: 122 mg/dL — ABNORMAL HIGH (ref 70–99)
Potassium: 4 mmol/L (ref 3.5–5.1)
Sodium: 138 mmol/L (ref 135–145)

## 2022-04-15 LAB — FOLATE: Folate: 20.7 ng/mL (ref >5.9)

## 2022-04-15 LAB — VITAMIN B12: Vitamin B-12: 1297 pg/mL — ABNORMAL HIGH (ref 180–914)

## 2022-04-15 LAB — FERRITIN: Ferritin: 647 ng/mL — ABNORMAL HIGH (ref 24–336)

## 2022-04-15 LAB — RETICULOCYTES
Immature Retic Fract: 38.1 % — ABNORMAL HIGH (ref 2.3–15.9)
RBC.: 2.36 MIL/uL — ABNORMAL LOW (ref 4.22–5.81)
Retic Count, Absolute: 61.1 10*3/uL (ref 19.0–186.0)
Retic Ct Pct: 2.6 % (ref 0.4–3.1)

## 2022-04-15 NOTE — Progress Notes (Signed)
? ?NAME:  Mark Yu, MRN:  [id number], DOB:  [birth year], LOS: 40 ?ADMISSION DATE:  03/08/2022, CONSULTATION DATE:  3/14 ?REFERRING MD:  [place], CHIEF COMPLAINT:  Dyspnea  ? ?History of Present Illness:  ?66 y/o male presented with limited stage small cell lung cancer and COPD admitted with acute on chronic respiratory failure with hypoxemia due to rhinovirus requiring intubation on 3/14.  He eventually required tracheostomy  ? ?Pertinent  Medical History  ?Severe COPD with emphysema, Depression, Anxiety, HTN, extensive stage SCLC ?Concurrent systemic chemotherapy and radiation treatment ?Rhinovirus infection ? ?Significant Hospital Events: ?Including procedures, antibiotic start and stop dates in addition to other pertinent events   ?3/12 Admit with dyspnea, AECOPD in setting of rhinovirus  ?3/14 PCCM consulted for evaluation of respiratory failure. Intubated for airway protection  ?3/17 On vent, XRT  ?3/18 tolerating pressure support today ?3/19 not weaning as well today with every episode of bronchospasms ?3/20 PRBC for hgb of 6.6, PSV wean & extubated.  Failed due to global weakness/inability to clear secretions > reintubated.  ?Tracheal aspirate negative normal flora ?3/22 extubated. D/w family and pt trach vs one-way extubation. Pt was non-committal. Was on NIPPV for about 8 hrs then had to be re-intubated ?3/23 vanc and cefepime started. On-going family and pt discussion. He agreed to trach  ?Tracheal aspirate-no growth ?MRSA PCR negative ?03/20/22- TRACH - [name] and [name] ?03/21/22 -status post tracheostomy 40% FiO2 10 L oxygen.  Currently on trach collar.  Overnight was on ventilator.  Also on fentanyl infusion and Precedex infusion.  Nursing reports that he gets very anxious when trying to wean.  Nevertheless he sitting with tracheostomy and typing on the keyboard and asking appropriate questions.  On antibiotics cefepime.  On Jevity tube feeds.  Afebrile since 03/18/2022.  White count is coming  down. ?3/27 refused XRT again today ?3/28 stopped cefepime ?3/30 ATC daytime, Required increased oxygen overnight and placed back on vent ?4/10 Episode of AF/RVR, started back on amiodarone drip ?4/11 PCCM following on floor for trach, episode of hypoxia today, has RUL PNA, started on Cefepime ?4/13 No major issues over the last couple days. He remains on 8L ATC  ?4/17 No respiratory issues over the weekend, capping trial started ?4/18 tolerated capping trial well overnight ?4/19 Decanulate trach today  ? ?Interim History / Subjective:  ?Seen lying in bed with no acute complaints  ?Endorses some worry over the increased PT that will bed expected soon  ? ?Objective   ?Blood pressure 114/76, pulse 95, temperature 98.2 ?F (36.8 ?C), temperature source Oral, resp. rate 20, height 5\' 11"  (1.803 m), weight 46.4 kg, SpO2 99 %. ?   ?FiO2 (%):  [32 %] 32 %  ? ?Intake/Output Summary (Last 24 hours) at 04/15/2022 0728 ?Last data filed at 04/15/2022 0600 ?Gross per 24 hour  ?Intake 2060 ml  ?Output 2700 ml  ?Net -640 ml  ? ? ?Filed Weights  ? 03/30/22 0500 03/31/22 0500 04/09/22 1220  ?Weight: 41 kg 39.2 kg 46.4 kg  ? ?Physical Exam  ?General: Acute on chronically ill appearing deconditioned cachetic male lying in bed in NAD ?HEENT: Tach midline with cap in place , MM pink/moist, PERRL,  ?Neuro: Alert and oriented x3, non-focal  ?CV: s1s2 regular rate and rhythm, no murmur, rubs, or gallops,  ?PULM:  No increased work of breathing, on 3L [place], minimal secretions   ?GI: soft, bowel sounds active in all 4 quadrants, non-tender, non-distended, tolerating TF ?Extremities: warm/dry, no edema  ?Skin: no  rashes or lesions ? ?Resolved Hospital Problem list   ?Aspiration pneumonia ? ?Assessment & Plan:  ?Principal Problem: ?  Respiratory failure ([place]) ?Active Problems: ?  COPD exacerbation ([place]) ?  Anxiety ?  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  Anemia associated with chemotherapy ?  Essential hypertension ?  Metastasis  to lymph nodes ([place]) ?  Protein-calorie malnutrition, severe ?  Depression ?  Physical deconditioning ?  Tracheostomy dependent ([place]) ?  PAF (paroxysmal atrial fibrillation) ([place]) ?  Pneumonia of right upper lobe due to infectious organism ? ?Acute on chronic hypoxemic/hypercarbic respiratory failure from rhinovirus pneumonia with COPD exacerbation  ?Cigarette smoker ?Baseline severe COPD with extensive centrilobular and bullous emphysema  ?Tracheostomy status with some bleeding, resolved ?Pneumonia, RUL  ?-RUL PNA on CXR 4/11, had an episode of desaturation after radiation, on 10L 40%. S/P 5 day course of Cefepime ?Limited stage small cell lung cancer ?P: ?Continues to tolerate capping trials, decanualte today  ?Continue supplemental oxygen via [place] for sat goal >92 ?Mobilize as able  ?PT/OT efforts  ?Continue to encourage pulmonary hygiene  ? ?Rest of acute and chronic medical conditions managed per primary  ? ? ?Best Practice (right click and "Reselect all SmartList Selections" daily)  ?Per primary  ? ?Signature:   ? ?Ezelle Surprenant D. Harris, NP-C ?[facility] ?Personal contact information can be found on Amion  ?04/15/2022, 7:28 AM ? ? ? ?

## 2022-04-15 NOTE — Progress Notes (Signed)
[facility] ?PROGRESS NOTE ? ? ? ?Progress Note  ?Mark Yu  QVZ:[id number] DOB: December 14, 1956 DOA: 03/08/2022 ?PCP: [name], MD  ? ? ? ?Brief Narrative:  ? ?Mark Yu is an 66 y.o. 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  Patient is currently getting radiation therapy which seems to be helping his pulmonary status overall [facility] but denied with acute going speech difficulties speech evaluated the patient PEG tube placed on 04/03/2022 started feeding on 4 8 ? ? ? ?Significant Events: ?3/12 Admit with dyspnea, AECOPD in setting of rhinovirus  ?3/14 PCCM consulted for evaluation of respiratory failure. Intubated for airway protection  ?3/17 On vent, XRT  ?3/18 tolerating pressure support today ?3/19 not weaning as well today with every episode of bronchospasms ?3/20 PRBC for hgb of 6.6, PSV wean & extubated.  Failed due to global weakness/inability to clear secretions > reintubated.  ?Tracheal aspirate negative normal flora ?3/22 extubated. D/w family and pt trach vs one-way extubation. Pt was non-committal. Was on NIPPV for about 8 hrs then had to be re-intubated ?3/23 vanc and cefepime started. On-going family and pt discussion. He agreed to trach  ?Tracheal aspirate-no growth ?MRSA PCR negative ?03/20/22- TRACH - [name] and [name] ?03/21/22 -status post tracheostomy 40% FiO2 10 L oxygen.  Currently on trach collar.  Overnight was on  ventilator.  Also on fentanyl infusion and Precedex infusion.  Nursing reports that he gets very anxious when trying to wean.  Nevertheless he sitting with tracheostomy and typing on the keyboard and asking appropriate questions.  On antibiotics cefepime.  On Jevity tube feeds.  Afebrile since 03/18/2022.  White count is coming down. ?3/27 refused XRT again today ?3/28 stopped cefepime ?3/30 ATC daytime, Required increased oxygen overnight and placed back on vent ?4/10 Episode of AF/RVR, started back on amiodarone drip ?4/11 PCCM following on floor for trach, episode of hypoxia today, has RUL PNA, started on Cefepime ?4/13 No major issues over the last couple days. He remains on 8L ATC  ?4/17 No respiratory issues over the weekend, capping trial started ?4/18 tolerated capping trial well overnight ? ? ?Assessment/Plan:  ? ?Acute on chronic respiratory failure with hypoxia and hypercapnia Anemia,/COPD exacerbation/status post tracheostomy: ?Now status post trach collar with consideration to attempted decannulation prior to discharge capping trial by PCCM. ?Continue Brovana and albuterol as needed. ?Pulmonary recommended capping in 04/13/2022 ?Currently satting greater than 100% on nasal cannula. ? ?Right upper lobe pneumonia: ?Newly diagnosed on 04/07/2022 started empirically on cefepime negative MRSA swab. ?He has completed 5-day course of antibiotics in house. ? ?Small cell carcinoma/metastatic lymph nodes: ?Patient is currently receiving radiation therapy through 04/14/2022. ?We will need to follow-up with radiation specialist and oncologist as an outpatient. ? ?Anemia of chronic disease: ?Baseline hemoglobin like around 9, FOBT positive PET scan from January showed hypermetabolic area in his proximal colon. ?Has never had a colonoscopy. ?Lovenox was discontinued 04/09/2022. ?GI was consulted on 04/12/2022 and recommended no inpatient work-up. ?LAst hemoglobin  seven-point 1 repeat a CBC tomorrow morning. ? ?Transient  atrial fibrillation: ?Received amiodarone now in sinus rhythm. ?Anticoagulation not started. ? ?Essential hypertension: ?Continue Imodium. ? ?Anxiety/depression: ?Xanax as needed. ? ?Severe protein caloric malnutrition: ?Status post PEG tube on 04/03/2022 tolerating it well. ? ?Gross for Ingal cervical esophageal dysphagia: ?Evaluated by speech and recommended PEG tube. ? ?Hyponatremia: ?Resolved. ? ?Hypokalemia: ?Resolved. ? ?Severe deconditioning: ?Physical therapy evaluated the patient, will need skilled nursing facility once trach is out. ? ?Medial vertebral column unstageable pressure ulcer not present on admission ?RN Pressure Injury Documentation: ?Pressure Injury 03/15/22 Vertebral column Medial Deep Tissue Pressure Injury - Purple or maroon localized area of discolored intact skin or blood-filled blister due to damage of underlying soft tissue from pressure and/or shear. Maroon area with blanchea (Active)  ?03/15/22 0800  ?Location: Vertebral column  ?Location Orientation: Medial  ?Staging: Deep Tissue Pressure Injury - Purple or maroon localized area of discolored intact skin or blood-filled blister due to damage of underlying soft tissue from pressure and/or shear.  ?Wound Description (Comments): Maroon area with blancheable erythema surrounding  ?Present on Admission: No  ?Dressing Type Foam - Lift dressing to assess site every shift 04/14/22 2300  ? ? ? ?DVT prophylaxis: SCDs ?Family Communication: Fianc? ?Status is: Inpatient ?Remains inpatient appropriate because: Discharged to SNF and will 1 to 2 days pending pulmonary recommendation possible decannulation depending on bed availability. ? ? ? ?Code Status:  ? ?  ?Code Status Orders  ?(From admission, onward)  ?  ? ? ?  ? ?  Start     Ordered  ? 03/08/22 1903  Full code  Continuous       ? 03/08/22 1903  ? ?  ?  ? ?  ? ?Code Status History   ? ? This patient has a current code status but no historical code status.  ? ?  ? ? ? ? ?IV Access:   ? ?Peripheral IV ? ? ?Procedures and diagnostic studies:  ? ?No results found. ? ? ?Medical Consultants:  ? ?None. ? ? ?Subjective:  ? ? ?Faustino Congress complaining that he has not gotten enough information very talkative ? ?Objective:  ? ? ?Vitals:  ? 04/14/22 2056 04/14/22 2338 04/15/22 0355 04/15/22 0630  ?BP: 105/72   114/76  ?Pulse: 100 90 93 95  ?Resp: 18 18 17 20   ?Temp: 97.8 ?F (36.6 ?C)   98.2 ?F (36.8 ?C)  ?TempSrc: Oral   Oral  ?SpO2: 99% 96% 100% 99%  ?Weight:      ?Height:      ? ?SpO2: 99 % ?O2 Flow Rate (L/min): 3 L/min ?FiO2 (%): 32 % ? ? ?Intake/Output Summary (Last 24 hours) at 04/15/2022 0705 ?Last data filed at 04/15/2022 0600 ?Gross per 24 hour  ?Intake 2060 ml  ?Output 2700 ml  ?Net -640 ml  ? ?Filed Weights  ? 03/30/22 0500 03/31/22 0500 04/09/22 1220  ?Weight: 41 kg 39.2 kg 46.4 kg  ? ? ?Exam: ?General exam: In no acute distress, cachectic ?Respiratory system: Good air movement and clear to auscultation. ?Cardiovascular system: S1 & S2 heard, RRR. No JVD. ?Gastrointestinal system: Abdomen is nondistended, soft and nontender.  ?Extremities: No pedal edema. ?Skin: No rashes, lesions or ulcers ?Psychiatry: Judgement and insight appear normal. Mood & affect appropriate.  ? ? ?Data Reviewed:  ? ? ?Labs: ?Basic Metabolic Panel: ?Recent Labs  ?Lab 04/15/22 ?2671  ?NA 138  ?K 4.0  ?CL 97*  ?CO2 36*  ?GLUCOSE 122*  ?  BUN 20  ?CREATININE 0.32*  ?CALCIUM 8.7*  ? ?GFR ?Estimated Creatinine Clearance: 60.4 mL/min (A) (by C-G formula based on SCr of 0.32 mg/dL (L)). ?Liver Function Tests: ?No results for input(s): AST, ALT, ALKPHOS, BILITOT, PROT, ALBUMIN in the last 168 hours. ?No results for input(s): LIPASE, AMYLASE in the last 168 hours. ?No results for input(s): AMMONIA in the last 168 hours. ?Coagulation profile ?No results for input(s): INR, PROTIME in the last 168 hours. ?COVID-19 Labs ? ?No results for input(s): DDIMER, FERRITIN, LDH, CRP in the last 72 hours. ? ?Lab Results  ?Component Value Date   ? [place] NEGATIVE 03/08/2022  ? [place] NEGATIVE 01/30/2022  ? [place] NEGATIVE 01/06/2022  ? ? ?CBC: ?Recent Labs  ?Lab 04/09/22 ?0426 04/10/22 ?1114 04/12/22 ?0359 04/15/22 ?0358  ?WBC 5.4 8.2 4.6

## 2022-04-15 NOTE — TOC Progression Note (Signed)
Transition of Care (TOC) - Progression Note  ? ? ?Patient Details  ?Name: Gene Glazebrook ?MRN: [id number] ?Date of Birth: [birth year] ? ?Transition of Care (TOC) CM/SW Contact  ?Purcell Mouton, RN ?Phone Number: ?04/15/2022, 2:20 PM ? ?Clinical Narrative:    ?Pt asked that his sister Frazier Butt who is an Therapist, sports be called concerning discharge to SNF. [facility] was selected. Will start Insurance auth.  ? ?Barriers to Discharge: Insurance Authorization ? ?Expected Discharge Plan and Services ?Expected Discharge Plan: Home/Self Care ?  ?Discharge Planning Services: CM Consult ?  ?Living arrangements for the past 2 months: [place] ?                ?  ?  ?  ?  ?  ?  ?  ?  ?  ?  ? ? ?Social Determinants of Health (SDOH) Interventions ?  ? ?Readmission Risk Interventions ? ?  03/09/2022  ? 12:52 PM  ?Readmission Risk Prevention Plan  ?Transportation Screening Complete  ?PCP or Specialist Appt within 3-5 Days Complete  ?[place] or Home Care Consult Complete  ?Social Work Consult for [place] Planning/Counseling Complete  ?Medication Review Press photographer) Complete  ? ? ?

## 2022-04-15 NOTE — Progress Notes (Signed)
Speech Language Pathology Treatment: Dysphagia  ?Patient Details ?Name: Mark Yu ?MRN: [id number] ?DOB: 11-Apr-1956 ?Today's Date: 04/15/2022 ?Time: 7209-4709 ?SLP Time Calculation (min) (ACUTE ONLY): 20 min ? ?Assessment / Plan / Recommendation ?Clinical Impression ? Pt seen for skilled SLP to address dysphagia goals.  Note tentative plan for pt to dc Thurs or Friday - thus recommend to consider repeating MBS with clinical improvement observed. Pt instructed to cover his guaze with a flat hand to decrease air pressure loss with swallowing.  Reinstructed pt to importance of dental brushing - and provided him with toothpaste/brush for him to use prior to ice chips administration.  Ice chips x6 swallows consumed by pt with reflexive cough with 2/6 boluses - productive to thin secretions. Prior MBS conducted with small bore NG tube in place as well as trach - Messaged MD and obtained permission to repeat MBS prior to dc.  Advised pt to plan of MBS 4/20 am if xray schedule allows.  Instructed pt using teach back to consume ice chips, occluding stoma and conducting effortful swallow with cough and expectoration as needed.  He is making marginal progress re: swallowing  = baseline deficits reported by pt thus uncertain to ability to improve, but pt is motivated. ? ?  ?HPI HPI: Male with medical history of recently diagnosed small cell lung cancer, COPD, anxiety, HTN, and depression. He presented to the ED due to progressive shortness of breath with minimal exertion x2 days. he had been finished a steroid taper shortly before admission and had recently been prescribed an appetite stimulant.     Significant Events:  3/12- admission  3/14- intubation; OGT placement; initial RD assessment; tube feeding initiation   3/20- extubation + re-intubation; OGT removal + replacement  3/22- extubation + re-intubation; OGT removal + replacement  3/24- tracheostomy + diagnostic bronch; OGT removed; small bore NGT placed in R nare  (distal gastric per abdominal x-ray) per CCM notes.  Pt has undergone PMSV trials with adequate tolerance and phonation but rapid fatigue and increased amount of secretions present.   MBS completed showing severe dysphagia- Pt is s/p PEG. He has been libertated from the vent and states he may get trach removed. SLP following for dysphagia treatment.  Pt decannulated on 4/19 ?  ?   ?SLP Plan ? MBS ? ?  ?  ?Recommendations for follow up therapy are one component of a multi-disciplinary discharge planning process, led by the attending physician.  Recommendations may be updated based on patient status, additional functional criteria and insurance authorization. ?  ? ?Recommendations  ?Diet recommendations: Other(comment) (ice chips) ?Medication Administration: Via alternative means  ?   ?    ?   ? ? ? ? Oral Care Recommendations: Oral care BID;Patient independent with oral care ?Follow Up Recommendations: Skilled nursing-short term rehab (<3 hours/day) ?Assistance recommended at discharge: Frequent or constant Supervision/Assistance ?SLP Visit Diagnosis: Dysphagia, pharyngoesophageal phase (R13.14) ?Plan: MBS ? ? ? ? ?  ?  ? ? ?[name] ?Kathleen Lime, MS CCC SLP ?Acute Rehab Services ?Office [phone] ?Pager [phone] ? ?04/15/2022, 5:35 PM ?

## 2022-04-15 NOTE — Procedures (Signed)
Tracheostomy Change Note ? ?Patient Details:   ?Name: Mark Yu ?DOB: June 24, 1956 ?MRN: [id number] ?   ?Airway Documentation: ?   ? ?Evaluation ? O2 sats: stable throughout ?Complications: No apparent complications ?Patient did tolerate procedure well. ?Bilateral Breath Sounds: Diminished ?  ?Pt trach was removed per CCM order. Pt tolerated this procedure well with no complications noted and saturations remained at 99%.  ? ?Teneka Malmberg A Johnnette Laux ?04/15/2022, 11:35 AM ?

## 2022-04-15 NOTE — Care Management Important Message (Signed)
Important Message ? ?Patient Details IM Letter placed in Patients room. ?Name: Mark Yu ?MRN: [id number] ?Date of Birth: [birth year] ? ? ?Medicare Important Message Given:  Yes ? ? ? ? ?Kerin Salen ?04/15/2022, 1:15 PM ?

## 2022-04-15 NOTE — Progress Notes (Signed)
SLP Cancellation Note ? ?Patient Details ?Name: Mark Yu ?MRN: [id number] ?DOB: [birth year] ? ? ?Cancelled treatment:       Reason Eval/Treat Not Completed: Other (comment) (pt currently asleep, family member politely shushed SLP upon entrance to room) ? ? ?[name] ?04/15/2022, 9:50 AM ?Kathleen Lime, MS [facility] SLP ?Acute Rehab Services ?Office [phone] ?Pager [phone] ? ?

## 2022-04-16 ENCOUNTER — Inpatient Hospital Stay (HOSPITAL_COMMUNITY): Payer: Medicare Other

## 2022-04-16 DIAGNOSIS — J9621 Acute and chronic respiratory failure with hypoxia: Secondary | ICD-10-CM | POA: Diagnosis not present

## 2022-04-16 DIAGNOSIS — Z4659 Encounter for fitting and adjustment of other gastrointestinal appliance and device: Secondary | ICD-10-CM

## 2022-04-16 DIAGNOSIS — J9601 Acute respiratory failure with hypoxia: Secondary | ICD-10-CM | POA: Diagnosis not present

## 2022-04-16 DIAGNOSIS — Z978 Presence of other specified devices: Secondary | ICD-10-CM

## 2022-04-16 DIAGNOSIS — D6481 Anemia due to antineoplastic chemotherapy: Secondary | ICD-10-CM | POA: Diagnosis not present

## 2022-04-16 LAB — CBC
HCT: 22 % — ABNORMAL LOW (ref 39.0–52.0)
Hemoglobin: 6.7 g/dL — CL (ref 13.0–17.0)
MCH: 29.9 pg (ref 26.0–34.0)
MCHC: 30.5 g/dL (ref 30.0–36.0)
MCV: 98.2 fL (ref 80.0–100.0)
Platelets: 193 10*3/uL (ref 150–400)
RBC: 2.24 MIL/uL — ABNORMAL LOW (ref 4.22–5.81)
RDW: 16.3 % — ABNORMAL HIGH (ref 11.5–15.5)
WBC: 4.1 10*3/uL (ref 4.0–10.5)
nRBC: 0 % (ref 0.0–0.2)

## 2022-04-16 LAB — GLUCOSE, CAPILLARY
Glucose-Capillary: 105 mg/dL — ABNORMAL HIGH (ref 70–99)
Glucose-Capillary: 123 mg/dL — ABNORMAL HIGH (ref 70–99)

## 2022-04-16 LAB — PREPARE RBC (CROSSMATCH)

## 2022-04-16 MED ORDER — SODIUM CHLORIDE 0.9% IV SOLUTION: Freq: Once | INTRAVENOUS | Status: AC

## 2022-04-16 MED ORDER — JEVITY 1.5 CAL/FIBER PO LIQD
1000.0000 mL | ORAL | Status: DC
  Administered 2022-04-16: 1000 mL
  Filled 2022-04-16 (×3): qty 1000

## 2022-04-16 MED ORDER — FREE WATER
150.0000 mL | Freq: Every day | Status: DC
  Administered 2022-04-16 – 2022-04-17 (×6): 150 mL

## 2022-04-16 NOTE — Progress Notes (Signed)
Objective Swallowing Evaluation: Type of Study: MBS-Modified Barium Swallow Study ?  ?Patient Details  ?Name: Mark Yu ?MRN: [id number] ?Date of Birth: 01-29-1956 ? ?Today's Date: 04/16/2022 ?Time: SLP Start Time (ACUTE ONLY): 8891 ?-SLP Stop Time (ACUTE ONLY): 6945 ? ?SLP Time Calculation (min) (ACUTE ONLY): 14 min ? ? ?Past Medical History:  ?Past Medical History:  ?Diagnosis Date  ? Anxiety   ? tx xanax  ? COPD (chronic obstructive pulmonary disease) ([place])   ? tx inhalers  ? Depression   ? Dyspnea   ? with exertion  ? HTN (hypertension)   ? tx amlodipine  ? Hyponatremia 03/08/2022  ? ?Past Surgical History:  ?Past Surgical History:  ?Procedure Laterality Date  ? BRONCHIAL BIOPSY  02/02/2022  ? Procedure: BRONCHIAL BIOPSIES;  Surgeon: Collene Gobble, MD;  Location: [facility] ENDOSCOPY;  Service: Pulmonary;;  ? BRONCHIAL BRUSHINGS  02/02/2022  ? Procedure: BRONCHIAL BRUSHINGS;  Surgeon: Collene Gobble, MD;  Location: [facility] ENDOSCOPY;  Service: Pulmonary;;  ? BRONCHIAL NEEDLE ASPIRATION BIOPSY  02/02/2022  ? Procedure: BRONCHIAL NEEDLE ASPIRATION BIOPSIES;  Surgeon: Collene Gobble, MD;  Location: [facility] ENDOSCOPY;  Service: Pulmonary;;  ? IR GASTROSTOMY TUBE MOD SED  04/03/2022  ? IR IMAGING GUIDED PORT INSERTION  02/24/2022  ? MRI    ? NO PAST SURGERIES    ? VIDEO BRONCHOSCOPY WITH ENDOBRONCHIAL ULTRASOUND N/A 02/02/2022  ? Procedure: VIDEO BRONCHOSCOPY WITH ENDOBRONCHIAL ULTRASOUND;  Surgeon: Collene Gobble, MD;  Location: [facility] ENDOSCOPY;  Service: Pulmonary;  Laterality: N/A;  ? VIDEO BRONCHOSCOPY WITH RADIAL ENDOBRONCHIAL ULTRASOUND  02/02/2022  ? Procedure: VIDEO BRONCHOSCOPY WITH RADIAL ENDOBRONCHIAL ULTRASOUND;  Surgeon: Collene Gobble, MD;  Location: [facility] ENDOSCOPY;  Service: Pulmonary;;  ? ?HPI: Male with medical history of recently diagnosed small cell lung cancer, COPD, anxiety, HTN, and depression. He presented to the ED due to progressive shortness of breath with minimal exertion x2 days. he had been finished a steroid taper  shortly before admission and had recently been prescribed an appetite stimulant.     Significant Events:  3/12- admission  3/14- intubation; OGT placement; initial RD assessment; tube feeding initiation   3/20- extubation + re-intubation; OGT removal + replacement  3/22- extubation + re-intubation; OGT removal + replacement  3/24- tracheostomy + diagnostic bronch; OGT removed; small bore NGT placed in R nare (distal gastric per abdominal x-ray) per CCM notes.  Pt has undergone PMSV trials with adequate tolerance and phonation but rapid fatigue and increased amount of secretions present.   MBS completed showing severe dysphagia- Pt is s/p PEG. He has been libertated from the vent and states he may get trach removed. SLP following for dysphagia treatment.  Pt decannulated on 4/19.  Repeat MBS indicated to determine if any improvement present s/p decannulation and small bore feeding tube removal. ?  ?Subjective: pt awake in chair ? ? ? Recommendations for follow up therapy are one component of a multi-disciplinary discharge planning process, led by the attending physician.  Recommendations may be updated based on patient status, additional functional criteria and insurance authorization. ? ?Assessment / Plan / Recommendation ? ? ?  04/16/2022  ?  9:09 AM  ?Clinical Impressions  ?Clinical Impression Unfortunately pt continues with gross pharyngeal-cervical esophageal dysphagia.  Pt was decannulated on 4/19 and thus was instructed to cover stoma with flat hand to minimize  Severely impaired laryngeal closure allowing laryngeal penetration during swallow - without consistent sensation. Minimal pharyngeal clearance due to severe hypomotilty - impaired traction on UES resulting  in pyriform retention (Mixed with secretions)  Shallow pyriform allows sinus retention to spill into open airway post-swallow - posterior trachea - even with HOB reclined 45*.  Reflexive cough present with gross aspiration- but pt was unable to clear  as aspirates were deep into trachea.   Pt will benefit from aggressive SLP dysphagia treatment and will require repeat instrumental evalaution before transitioning into any po beyond small single ice chips.  SLP educated pt to findings using monitor for live feedback. Fortunately pt with strong cough and expectoration clearing portion of pharyngeal retention.   Suspect component of chronic dysphagia as pt reports he would cough with intake PTA.   Since pt is decannulated, will provide him with Shaker exercise to maximize laryngeal closure.  ?SLP Visit Diagnosis Dysphagia, pharyngeal phase (R13.13);Dysphagia, pharyngoesophageal phase (R13.14)  ?Impact on safety and function Severe aspiration risk;Risk for inadequate nutrition/hydration  ? ?   ? ?  04/16/2022  ?  9:09 AM  ?Treatment Recommendations  ?Treatment Recommendations Therapy as outlined in treatment plan below  ?   ? ?  04/16/2022  ?  9:18 AM  ?Prognosis  ?Prognosis for Safe Diet Advancement Guarded  ?Barriers to Reach Goals Severity of deficits;Time post onset  ? ? ? ?  04/16/2022  ?  9:09 AM  ?Diet Recommendations  ?SLP Diet Recommendations Ice chips PRN after oral care  ?Liquid Administration via Other (Comment)  ?Medication Administration Via alternative means  ?Compensations Slow rate;Small sips/bites  ?   ? ? ?  04/16/2022  ?  9:09 AM  ?Other Recommendations  ?Oral Care Recommendations Oral care QID  ?Follow Up Recommendations Skilled nursing-short term rehab (<3 hours/day)  ?Assistance recommended at discharge Frequent or constant Supervision/Assistance  ?Functional Status Assessment Patient has had a recent decline in their functional status and/or demonstrates limited ability to make significant improvements in function in a reasonable and predictable amount of time  ? ? ? ?  04/16/2022  ?  9:09 AM  ?Frequency and Duration   ?Speech Therapy Frequency (ACUTE ONLY) min 2x/week  ?   ? ? ?  04/16/2022  ?  8:56 AM  ?Oral Phase  ?Oral Phase Impaired  ?Oral -  Nectar Teaspoon WFL  ?Oral - Nectar Cup [facility]  ?Oral - Thin Teaspoon WFL  ?Oral - Thin Cup NT  ?  ? ?  04/16/2022  ?  9:05 AM  ?Pharyngeal Phase  ?Pharyngeal- Nectar Teaspoon Reduced epiglottic inversion;Reduced laryngeal elevation;Reduced airway/laryngeal closure;Penetration/Apiration after swallow;Moderate aspiration;Pharyngeal residue - pyriform;Pharyngeal residue - valleculae  ?Pharyngeal Material enters airway, passes BELOW cords without attempt by patient to eject out (silent aspiration);Material enters airway, passes BELOW cords and not ejected out despite cough attempt by patient  ?Pharyngeal Material enters airway, passes BELOW cords without attempt by patient to eject out (silent aspiration);Material enters airway, passes BELOW cords and not ejected out despite cough attempt by patient  ?Pharyngeal- Thin Teaspoon Reduced laryngeal elevation;Reduced airway/laryngeal closure;Penetration/Aspiration during swallow;Penetration/Apiration after swallow;Pharyngeal residue - pyriform;Pharyngeal residue - valleculae  ?Pharyngeal Material enters airway, passes BELOW cords without attempt by patient to eject out (silent aspiration);Material enters airway, passes BELOW cords and not ejected out despite cough attempt by patient  ?Pharyngeal- Thin Cup NT  ?Pharyngeal Comment Decreased laryngeal closure allows penetration/aspiration of thin during swallow.  HOB reclined did not improve airway protection, pt has shallow pyriform sinuses resulted in pyriform sinus retention spilled into posterior trachea - both audible and silent.  ?  ? ? ?  04/16/2022  ?  9:08 AM  ?Cervical Esophageal Phase   ?Cervical Esophageal Phase Impaired  ?Nectar Teaspoon Reduced cricopharyngeal relaxation  ?Nectar Cup Reduced cricopharyngeal relaxation  ?Thin Teaspoon Reduced cricopharyngeal relaxation  ?Thin Cup NT  ?Thin Straw NT  ? ? ? ?[name] ?04/16/2022, 9:19 AM ?  ? ?   ?Kathleen Lime, MS CCC SLP ?Acute Rehab Services ?Office  [phone] ?Pager [phone] ?   ?   ?   ?   ? ?  ? ?  ?  ?   ?  ? ? ?  ? ? ?

## 2022-04-16 NOTE — Progress Notes (Addendum)
Nutrition Follow-up ? ?DOCUMENTATION CODES:  ? ?Severe malnutrition in context of chronic illness, Underweight ? ?INTERVENTION:  ?- will adjust TF regimen: Jevity 1.5 @ 85 ml/hr x18 hours/day (1500-0900) with 45 ml Prosource TF TID and 200 ml free water x6/day.  ? ?- this regimen will provide 2415 kcal, 131 grams protein, 32 grams fiber, and 2062 ml free water. ? ?- increased fluid need with increase in fiber provision.  ? ? ?NUTRITION DIAGNOSIS:  ? ?Severe Malnutrition related to chronic illness, cancer and cancer related treatments as evidenced by severe fat depletion, severe muscle depletion. -ongoing ? ?GOAL:  ? ?Patient will meet greater than or equal to 90% of their needs -met with TF regimen ? ?MONITOR:  ? ?TF tolerance, Diet advancement, Labs, Weight trends, Skin ? ? ?ASSESSMENT:  ? ?66 y.o. male with medical history of recently diagnosed small cell lung cancer, COPD, anxiety, HTN, and depression. He presented to the ED due to progressive shortness of breath with minimal exertion x2 days. he had been finished a steroid taper shortly before admission and had recently been prescribed an appetite stimulant. ? ?Significant Events: ?3/12- admission ?3/14- intubation; OGT placement; initial RD assessment; tube feeding initiation  ?3/20- extubation + re-intubation; OGT removal + replacement ?3/22- extubation + re-intubation; OGT removal + replacement ?3/24- tracheostomy + diagnostic bronch; OGT removed; small bore NGT placed in R nare (distal gastric per abdominal x-ray) ?4/4- NGT became clogged and was replaced; transitioned to TF over 18 hours/day ?4/5- TF placed on hold at midnight pending PEG on 4/6 ?4/7- PEG placed by IR ? ? ?Patient laying in bed sleeping at the time of RD visit; no visitors present at that time. Noted SLP note from this AM with recommendation for nutrition to continue to be per alternative means. ? ?Kangaroo pump not on IV pole. Able to communicate with RN via phone and she shares that  patient did get TF regimen as ordered over night and that pump may have been removed during the course of transport on and off the floor to work with SLP.  ? ?Patient is currently ordered Jevity 1.5 @ 85 ml/hr x16 hours/day (1600-0800) with 45 ml Prosource TF TID and 100 ml free water x6/day.  ? ?This regimen is providing 2160 kcal, 120 grams protein, 28 grams fiber, and 1634 ml free water.  ? ? ? ?Labs reviewed; CBG: 105 mg/dl, Cl: 97 mmol/l, creatinine: 0.32 mg/dl, Ca: 8.7 mg/dl. ?Medications reviewed; 1000 units cholecalciferol/day, 1 mg folvite/day, 400 mg mag-ox/day, 17 g miralax/day, 1000 mcg cyanocobalamin per G-tube/day.  ? ? ?Diet Order:   ?Diet Order   ? ?       ?  Diet NPO time specified Except for: Ice Chips  Diet effective now       ?  ? ?  ?  ? ?  ? ? ?EDUCATION NEEDS:  ? ?No education needs have been identified at this time ? ?Skin:  Skin Assessment: Skin Integrity Issues: ?Skin Integrity Issues:: DTI ?DTI: vertebral column ? ?Last BM:  4/13 (type 4 x1, medium amount) ? ?Height:  ? ?Ht Readings from Last 1 Encounters:  ?03/18/22 $RemoveBe'5\' 11"'knsBlqzIK$  (1.803 m)  ? ? ?Weight:  ? ?Wt Readings from Last 1 Encounters:  ?04/09/22 46.4 kg  ? ? ? ?BMI:  Body mass index is 14.27 kg/m?. ? ?Estimated Nutritional Needs:  ?Kcal:  2350-2550 kcal ?Protein:  115-130 grams ?Fluid:  >/= 2.3 L/day ? ? ? ? ?Jarome Matin, MS, RD, LDN ?Registered Dietitian II ?  Inpatient Clinical Nutrition ?RD pager # and on-call/weekend pager # available in [place]  ? ?

## 2022-04-16 NOTE — Progress Notes (Signed)
Patient ID: Mark Yu, male   DOB: 12-08-1956, 66 y.o.   MRN: [id number] ?Spoke with Speech Pathology this morning. Plan for patient to have modified barium swallow at 8:00 am and then return to unit and receive 2 units PRBC. ? ?Haydee Salter, RN ? ?

## 2022-04-16 NOTE — TOC Progression Note (Signed)
Transition of Care (TOC) - Progression Note  ? ? ?Patient Details  ?Name: Jader Desai ?MRN: [id number] ?Date of Birth: Dec 02, 1956 ? ?Transition of Care (TOC) CM/SW Contact  ?Purcell Mouton, RN ?Phone Number: ?04/16/2022, 12:00 PM ? ?Clinical Narrative:    ? ?Spoke with pt concerning who to talk in reference going to a SNF. Pt states talk to [facility], I want her to chose. Demetra Shiner was called, she selected to take pt home with [facility]HRN/PT/OT/ST/NA and [facility] for DME. Will need HH orders.  ? ? ?Expected Discharge Plan: Home/Self Care ?Barriers to Discharge: Insurance Authorization ? ?Expected Discharge Plan and Services ?Expected Discharge Plan: Home/Self Care ?  ?Discharge Planning Services: CM Consult ?  ?Living arrangements for the past 2 months: [place] ?                ?  ?  ?  ?  ?  ?  ?  ?  ?  ?  ? ? ?Social Determinants of Health (SDOH) Interventions ?  ? ?Readmission Risk Interventions ? ?  03/09/2022  ? 12:52 PM  ?Readmission Risk Prevention Plan  ?Transportation Screening Complete  ?PCP or Specialist Appt within 3-5 Days Complete  ?[place] or Home Care Consult Complete  ?Social Work Consult for [place] Planning/Counseling Complete  ?Medication Review Press photographer) Complete  ? ? ?

## 2022-04-16 NOTE — Progress Notes (Signed)
?  ?  Durable Medical Equipment  ?(From admission, onward)  ?  ? ? ?  ? ?  Start     Ordered  ? 04/16/22 1257  For home use only DME Bedside commode  Once       ?Question:  Patient needs a bedside commode to treat with the following condition  Answer:  Fear for personal safety  ? 04/16/22 1256  ? 04/16/22 1247  For home use only DME lightweight manual wheelchair with seat cushion  Once       ?Comments: Patient suffers from COPD, Chronic respiratory Failure with Hypoxia, Hypercapnia anemia, small cell Carcinoma, status post tracheostomy, which impairs their ability to perform daily activities like bathing in the home.  A walker will not resolve  ?issue with performing activities of daily living. A wheelchair will allow patient to safely perform daily activities. Patient is not able to propel themselves in the home using a standard weight wheelchair due to general weakness. Patient can self propel in the lightweight wheelchair. Length of need Lifetime. ?Accessories: elevating leg rests (ELRs), wheel locks, extensions and anti-tippers.  ? 04/16/22 1256  ? ?  ?  ? ?  ?  ?

## 2022-04-16 NOTE — Progress Notes (Signed)
[facility] ?PROGRESS NOTE ? ? ? ?Progress Note  ?Mark Yu  OIZ:[id number] DOB: January 29, 1956 DOA: 03/08/2022 ?PCP: [name], MD  ? ? ? ?Brief Narrative:  ? ?Mark Yu is an 66 y.o. 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  Patient is currently getting radiation therapy which seems to be helping his pulmonary status overall [facility] but denied with acute going speech difficulties speech evaluated the patient PEG tube placed on 04/03/2022 started feeding on 4 8 ? ? ? ?Significant Events: ?3/12 Admit with dyspnea, AECOPD in setting of rhinovirus  ?3/14 PCCM consulted for evaluation of respiratory failure. Intubated for airway protection  ?3/17 On vent, XRT  ?3/18 tolerating pressure support today ?3/19 not weaning as well today with every episode of bronchospasms ?3/20 PRBC for hgb of 6.6, PSV wean & extubated.  Failed due to global weakness/inability to clear secretions > reintubated.  ?Tracheal aspirate negative normal flora ?3/22 extubated. D/w family and pt trach vs one-way extubation. Pt was non-committal. Was on NIPPV for about 8 hrs then had to be re-intubated ?3/23 vanc and cefepime started. On-going family and pt discussion. He agreed to trach  ?Tracheal aspirate-no growth ?MRSA PCR negative ?03/20/22- TRACH - [name] and [name] ?03/21/22 -status post tracheostomy 40% FiO2 10 L oxygen.  Currently on trach collar.  Overnight was on  ventilator.  Also on fentanyl infusion and Precedex infusion.  Nursing reports that he gets very anxious when trying to wean.  Nevertheless he sitting with tracheostomy and typing on the keyboard and asking appropriate questions.  On antibiotics cefepime.  On Jevity tube feeds.  Afebrile since 03/18/2022.  White count is coming down. ?3/27 refused XRT again today ?3/28 stopped cefepime ?3/30 ATC daytime, Required increased oxygen overnight and placed back on vent ?4/10 Episode of AF/RVR, started back on amiodarone drip ?4/11 PCCM following on floor for trach, episode of hypoxia today, has RUL PNA, started on Cefepime ?4/13 No major issues over the last couple days. He remains on 8L ATC  ?4/17 No respiratory issues over the weekend, capping trial started ?4/18 tolerated capping trial well overnight ? ? ?Assessment/Plan:  ? ?Acute on chronic respiratory failure with hypoxia and hypercapnia Anemia,/COPD exacerbation/status post tracheostomy: ?Pulmonary critical care decannulated the patient, he has been satting greater than 98% on 2 L of oxygen try to wean to room air. ?Continue Brovana and albuterol as needed. ? ?Right upper lobe pneumonia: ?Newly diagnosed on 04/07/2022 started empirically on cefepime negative MRSA swab. ?He has completed 5-day course of antibiotics in house. ?He is scheduled for an MBBS today the shows severe risk for aspiration, continue feeding through PEG tube G-tube. ? ?Small cell carcinoma/metastatic lymph nodes: ?Patient is currently receiving radiation therapy through 04/14/2022. ?We will need to follow-up with radiation specialist and oncologist as an outpatient. ? ?Anemia of chronic disease: ?Baseline hemoglobin like around 9, FOBT positive PET scan from January showed hypermetabolic area in his proximal colon. ?Has never had a colonoscopy. ?Lovenox was discontinued  04/09/2022. ?GI was consulted on 04/12/2022 and recommended no inpatient work-up. ?Hemoglobin this morning is 6.7 we will have  transfused 2 units packed red blood cells recheck CBC posttransfusion. ? ?Transient atrial fibrillation: ?Received amiodarone now in sinus rhythm. ?Not a candidate for hospitalization at this time, will need first GI work-up due to his chronic occult bleeding. ? ?Essential hypertension: ?Continue Norvasc ? ?Anxiety/depression: ?Xanax as needed. ? ?Severe protein caloric malnutrition: ?Status post PEG tube on 04/03/2022 tolerating it well. ?Continue free water with recheck basic metabolic panel tomorrow morning. ? ?Gross cervical esophageal dysphagia: ?Evaluated by speech and recommended PEG tube. ?WBS done today high risk of aspiration. ? ?Hyponatremia: ?Resolved. ? ?Hypokalemia: ?Resolved. ? ?Severe deconditioning: ?Physical therapy evaluated the patient, will need skilled nursing facility. ? ?Medial vertebral column unstageable pressure ulcer not present on admission ?RN Pressure Injury Documentation: ?Pressure Injury 03/15/22 Vertebral column Medial Deep Tissue Pressure Injury - Purple or maroon localized area of discolored intact skin or blood-filled blister due to damage of underlying soft tissue from pressure and/or shear. Maroon area with blanchea (Active)  ?03/15/22 0800  ?Location: Vertebral column  ?Location Orientation: Medial  ?Staging: Deep Tissue Pressure Injury - Purple or maroon localized area of discolored intact skin or blood-filled blister due to damage of underlying soft tissue from pressure and/or shear.  ?Wound Description (Comments): Maroon area with blancheable erythema surrounding  ?Present on Admission: No  ?Dressing Type Foam - Lift dressing to assess site every shift 04/16/22 0745  ? ? ? ?DVT prophylaxis: SCDs ?Family Communication: Fianc? ?Status is: Inpatient ?Remains inpatient appropriate because: Discharged to SNF and will 1 to 2 days pending pulmonary recommendation possible decannulation depending on bed availability. ? ? ? ?Code Status:  ? ?  ?Code Status Orders  ?(From admission,  onward)  ?  ? ? ?  ? ?  Start     Ordered  ? 03/08/22 1903  Full code  Continuous       ? 03/08/22 1903  ? ?  ?  ? ?  ? ?Code Status History   ? ? This patient has a current code status but no historical code status.  ? ?  ? ? ? ? ?IV Access:  ? ?Peripheral IV ? ? ?Procedures and diagnostic studies:  ? ?No results found. ? ? ?Medical Consultants:  ? ?None. ? ? ?Subjective:  ? ? ?Mark Yu very talkative complaining about everything. ? ?Objective:  ? ? ?Vitals:  ? 04/15/22 2102 04/15/22 2326 04/16/22 0401 04/16/22 0422  ?BP: 122/69   127/78  ?Pulse: 92 76 92 96  ?Resp: 18 18 (!) 21 20  ?Temp: 97.6 ?F (36.4 ?C)   97.7 ?F (36.5 ?C)  ?TempSrc: Oral   Oral  ?SpO2: 98% 97% 96% 98%  ?Weight:      ?Height:      ? ?SpO2: 98 % ?O2 Flow Rate (L/min): 2 L/min ?FiO2 (%): 32 % ? ? ?Intake/Output Summary (Last 24 hours) at 04/16/2022 0919 ?Last data filed at 04/16/2022 [phone] ?Gross per 24 hour  ?Intake --  ?Output 2100 ml  ?Net -2100 ml  ? ? ?Filed Weights  ? 03/30/22 0500 03/31/22 0500 04/09/22 1220  ?Weight: 41 kg 39.2 kg 46.4 kg  ? ? ?Exam: ?General exam: In no acute distress. ?Respiratory system: Good air movement and clear to auscultation. ?Cardiovascular system: S1 & S2 heard, RRR. No JVD. ?Gastrointestinal system: Abdomen is nondistended, soft and nontender.  ?Extremities: No pedal edema. ?Skin: No rashes, lesions or ulcers ?  Psychiatry: Judgement and insight appear normal. Mood & affect appropriate. ? ? ?Data Reviewed:  ? ? ?Labs: ?Basic Metabolic Panel: ?Recent Labs  ?Lab 04/15/22 ?8375  ?NA 138  ?K 4.0  ?CL 97*  ?CO2 36*  ?GLUCOSE 122*  ?BUN 20  ?CREATININE 0.32*  ?CALCIUM 8.7*  ? ? ?GFR ?Estimated Creatinine Clearance: 60.4 mL/min (A) (by C-G formula based on SCr of 0.32 mg/dL (L)). ?Liver Function Tests: ?No results for input(s): AST, ALT, ALKPHOS, BILITOT, PROT, ALBUMIN in the last 168 hours. ?No results for input(s): LIPASE, AMYLASE in the last 168 hours. ?No results for input(s): AMMONIA in the last 168  hours. ?Coagulation profile ?No results for input(s): INR, PROTIME in the last 168 hours. ?COVID-19 Labs ? ?Recent Labs  ?  04/15/22 ?1406  ?FERRITIN 647*  ? ? ?Lab Results  ?Component Value Date  ? [id number] NEG

## 2022-04-16 NOTE — Progress Notes (Signed)
Speech Language Pathology Treatment: Dysphagia  ?Patient Details ?Name: Mark Yu ?MRN: [id number] ?DOB: 17-Nov-1956 ?Today's Date: 04/16/2022 ?Time: 8891-6945 ?SLP Time Calculation (min) (ACUTE ONLY): 13 min ? ?Assessment / Plan / Recommendation ?Clinical Impression ? SLP session conducted to initiate therapuetic exercises including Shaker head left, lingual press, and CTAR *chin tuck against resistance addressing laryngeal elevation to improve laryngeal closure and UES opening.  Improvement in these areas could decrease retention and mitigate aspiration.  Pt benefited from max cues to participate - Demonstrated exercises * with pt returning demonstration with verbal/visual/tactile cues- including palpating neck to assure muscular contraction.  Pt able to lift x3 requiring max effort due to his gross deconditioning.  Lingual press conducted x10 - holding for 3 seconds - with this exercise being slighly more abstract - thus pt reports less taxing.  CTAR conducted x5 - with pt holding chin tuck against rolled up towel to strengthen musculature- Max effort required by patient.  SLP wrote exercises for pt and provided graph for him to track.  Pt reported he was tired and wanted a nap prior to getting blood. ? ?  ?HPI HPI: Male with medical history of recently diagnosed small cell lung cancer, COPD, anxiety, HTN, and depression. He presented to the ED due to progressive shortness of breath with minimal exertion x2 days. he had been finished a steroid taper shortly before admission and had recently been prescribed an appetite stimulant.     Significant Events:  3/12- admission  3/14- intubation; OGT placement; initial RD assessment; tube feeding initiation   3/20- extubation + re-intubation; OGT removal + replacement  3/22- extubation + re-intubation; OGT removal + replacement  3/24- tracheostomy + diagnostic bronch; OGT removed; small bore NGT placed in R nare (distal gastric per abdominal x-ray) per CCM notes.  Pt  has undergone PMSV trials with adequate tolerance and phonation but rapid fatigue and increased amount of secretions present.   MBS completed showing severe dysphagia- Pt is s/p PEG. He has been libertated from the vent and states he may get trach removed. SLP following for dysphagia treatment.  Pt decannulated on 4/19.  Repeat MBS indicated to determine if any improvement present s/p decannulation and small bore feeding tube removal. ?  ?   ?SLP Plan ? Continue with current plan of care ? ?  ?  ?Recommendations for follow up therapy are one component of a multi-disciplinary discharge planning process, led by the attending physician.  Recommendations may be updated based on patient status, additional functional criteria and insurance authorization. ?  ? ?Recommendations  ?Compensations: Slow rate;Small sips/bites  ?   ?    ?   ? ? ? ? Oral Care Recommendations: Oral care BID;Patient independent with oral care ?Follow Up Recommendations: Skilled nursing-short term rehab (<3 hours/day) ?Assistance recommended at discharge: Frequent or constant Supervision/Assistance ?SLP Visit Diagnosis: Dysphagia, pharyngeal phase (R13.13);Dysphagia, pharyngoesophageal phase (R13.14) ?Plan: Continue with current plan of care ? ? ? ? ?  ?  ?Kathleen Lime, MS CCC SLP ?Acute Rehab Services ?Office [phone] ?Pager [phone] ? ? ?[name] ? ?04/16/2022, 11:55 AM ?

## 2022-04-17 LAB — TYPE AND SCREEN
ABO/RH(D): O POS
Antibody Screen: NEGATIVE
Unit division: 0
Unit division: 0

## 2022-04-17 LAB — BPAM RBC
Blood Product Expiration Date: 202305222359
Blood Product Expiration Date: 202305222359
ISSUE DATE / TIME: 202304201300
ISSUE DATE / TIME: 202304201955
Unit Type and Rh: 5100
Unit Type and Rh: 5100

## 2022-04-17 LAB — BASIC METABOLIC PANEL
Anion gap: 5 (ref 5–15)
BUN: 19 mg/dL (ref 8–23)
CO2: 33 mmol/L — ABNORMAL HIGH (ref 22–32)
Calcium: 8.6 mg/dL — ABNORMAL LOW (ref 8.9–10.3)
Chloride: 99 mmol/L (ref 98–111)
Creatinine, Ser: <0.3 mg/dL — ABNORMAL LOW (ref 0.61–1.24)
Glucose, Bld: 88 mg/dL (ref 70–99)
Potassium: 4.1 mmol/L (ref 3.5–5.1)
Sodium: 137 mmol/L (ref 135–145)

## 2022-04-17 LAB — HEMOGLOBIN AND HEMATOCRIT, BLOOD
HCT: 31.5 % — ABNORMAL LOW (ref 39.0–52.0)
Hemoglobin: 10 g/dL — ABNORMAL LOW (ref 13.0–17.0)

## 2022-04-17 LAB — GLUCOSE, CAPILLARY: Glucose-Capillary: 140 mg/dL — ABNORMAL HIGH (ref 70–99)

## 2022-04-17 MED ORDER — OSMOLITE 1.5 CAL PO LIQD: 1000.0000 mL | ORAL | 0 refills | Status: AC

## 2022-04-17 MED ORDER — PANCRELIPASE (LIP-PROT-AMYL) 20880-78300 UNITS PO TABS
20880.0000 [IU] | ORAL_TABLET | Freq: Once | ORAL | 0 refills | Status: AC
Start: 2022-04-17 — End: 2022-04-17

## 2022-04-17 MED ORDER — PANTOPRAZOLE SODIUM 40 MG PO PACK: 40.0000 mg | PACK | Freq: Every day | ORAL | 3 refills | Status: DC

## 2022-04-17 MED ORDER — FREE WATER: 150.0000 mL | Freq: Every day | 3 refills | Status: AC

## 2022-04-17 MED ORDER — JEVITY 1.5 CAL/FIBER PO LIQD
1000.0000 mL | ORAL | Status: DC
Start: 2022-04-17 — End: 2022-04-17

## 2022-04-17 MED ORDER — HEPARIN SOD (PORK) LOCK FLUSH 100 UNIT/ML IV SOLN
500.0000 [IU] | INTRAVENOUS | Status: AC | PRN
  Administered 2022-04-17: 500 [IU]
  Filled 2022-04-17: qty 5

## 2022-04-17 MED ORDER — PROSOURCE TF PO LIQD
45.0000 mL | Freq: Three times a day (TID) | ORAL | 3 refills | Status: DC
Start: 2022-04-17 — End: 2022-08-12

## 2022-04-17 NOTE — Progress Notes (Signed)
Teaching provided to patient regarding commode and wheelchair. Discussed specifics of wheelchair including taking apart for transport home. ?

## 2022-04-17 NOTE — TOC Progression Note (Addendum)
Transition of Care (TOC) - Progression Note  ? ? ?Patient Details  ?Name: Zameer Borman ?MRN: [id number] ?Date of Birth: 24-Feb-1956 ? ?Transition of Care (TOC) CM/SW Contact  ?Purcell Mouton, RN ?Phone Number: ?04/17/2022, 1:32 PM ? ?Clinical Narrative:    ? [facility] called for 4PM pick up for home. Pt's RN, pt and Demetra Shiner are aware of [facility] coming at 4.  ? ? ?Expected Discharge Plan: Home/Self Care ?Barriers to Discharge: Insurance Authorization ? ?Expected Discharge Plan and Services ?Expected Discharge Plan: Home/Self Care ?  ?Discharge Planning Services: CM Consult ?  ?Living arrangements for the past 2 months: [place] ?Expected Discharge Date: 04/17/22               ?  ?  ?  ?  ?  ?  ?  ?  ?  ?  ? ? ?Social Determinants of Health (SDOH) Interventions ?  ? ?Readmission Risk Interventions ? ?  03/09/2022  ? 12:52 PM  ?Readmission Risk Prevention Plan  ?Transportation Screening Complete  ?PCP or Specialist Appt within 3-5 Days Complete  ?[place] or Home Care Consult Complete  ?Social Work Consult for [place] Planning/Counseling Complete  ?Medication Review Press photographer) Complete  ? ? ?

## 2022-04-17 NOTE — Progress Notes (Signed)
Physical Therapy Treatment ?Patient Details ?Name: Mark Yu ?MRN: [id number] ?DOB: 12-Apr-1956 ?Today's Date: 04/17/2022 ? ? ?History of Present Illness 66 yo male smoker presented to [facility] with dyspnea.   Found to have rhinovirus, pna, COPD exacerbation. had progressive hypoxia and transferred to SDU 3/13 and started on Bipap.  Respiratory status and mental status worsened,  intubated on 3/14, Ext 3/22 and re-intubated, trach 3/24.  PMH: COPD with emphysema and recent diagnosis of extensive stage SCLC on concurrent chemoradiation therapy. ? ?  ?PT Comments  ? ? Focus of session today was bed mobility and repeated step pivot transfers. The patient tolerated well. He was able to perform 2x STS and step pivot transfer w/o RW and 2x w/ RW. Overall he was min A for power up and steady other than the last transfer where he required mod A for power due to fatigue. Pt. Shows overall improvement with his bed mobility and transfer ability. Overall functional strength, endurance, balance, and gait are still limiting function. Handout administered to demonstrate and further educate on both transfer styles performed during the session. The pt showed good understanding of sequencing for the transfers throughout the session. He would benefit from skilled PT to continue to address further functional training to improve independence. Plan and d/c setting need to be updated to home health PT to further progress the pt's independence, functional ability, and care giver training. Pt to follow acutely as appropriate.  ?   ?Recommendations for follow up therapy are one component of a multi-disciplinary discharge planning process, led by the attending physician.  Recommendations may be updated based on patient status, additional functional criteria and insurance authorization. ? ?Follow Up Recommendations ? Home health PT ?  ?  ?Assistance Recommended at Discharge Frequent or constant Supervision/Assistance  ?Patient can return home  with the following Help with stairs or ramp for entrance;Assistance with feeding;Assist for transportation;Assistance with cooking/housework;Two people to help with walking and/or transfers;A little help with bathing/dressing/bathroom;Direct supervision/assist for medications management ?  ?Equipment Recommendations ? Rolling walker (2 wheels)  ?  ?Recommendations for Other Services   ? ? ?  ?Precautions / Restrictions Precautions ?Precautions: Fall ?Precaution Comments: PEG ?Restrictions ?Weight Bearing Restrictions: No  ?  ? ?Mobility ? Bed Mobility ?Overal bed mobility: Needs Assistance ?Bed Mobility: Supine to Sit ?  ?  ?Supine to sit: Supervision, HOB elevated ?Sit to supine: Supervision, HOB elevated ?  ?General bed mobility comments: extra time, assited boost in bed ?  ? ?Transfers ?Overall transfer level: Needs assistance ?Equipment used: Rolling walker (2 wheels) ?Transfers: Sit to/from Stand, Bed to chair/wheelchair/BSC ?Sit to Stand: Min assist, Mod assist ?  ?Step pivot transfers: Min assist ?  ?  ?  ?General transfer comment: Min A and then mod A by end of session for power up, min A for steady during step pivot. Cues to correct posterior lean. Practiced x2 w/o RW and x2 w/ RW. Pt requires significant rest after each transfer ?  ? ?Ambulation/Gait ?  ?  ?  ?  ?  ?  ?  ?  ? ? ?Stairs ?  ?  ?  ?  ?  ? ? ?Wheelchair Mobility ?  ? ?Modified Rankin (Stroke Patients Only) ?  ? ? ?  ?Balance Overall balance assessment: Needs assistance ?Sitting-balance support: Feet supported ?Sitting balance-Leahy Scale: Good ?Sitting balance - Comments: Able to maintain EOB supervision level and Edge of chair w/o issue ?Postural control: Posterior lean ?Standing balance support: Bilateral upper extremity supported,  During functional activity, Reliant on assistive device for balance ?Standing balance-Leahy Scale: Poor ?Standing balance comment: Requires external support or RW, verbal and tactile cues to prevent posterior  LOB ?  ?  ?  ?  ?  ?  ?  ?  ?  ?  ?  ?  ? ?  ?Cognition Arousal/Alertness: Awake/alert ?Behavior During Therapy: [facility] for tasks assessed/performed ?Overall Cognitive Status: Within Functional Limits for tasks assessed ?  ?  ?  ?  ?  ?  ?  ?  ?  ?  ?  ?  ?  ?  ?  ?  ?  ?  ?  ? ?  ?Exercises   ? ?  ?General Comments   ?  ?  ? ?Pertinent Vitals/Pain Pain Assessment ?Pain Assessment: No/denies pain  ? ? ?Home Living   ?  ?  ?  ?  ?  ?  ?  ?  ?  ?   ?  ?Prior Function    ?  ?  ?   ? ?PT Goals (current goals can now be found in the care plan section) Acute Rehab PT Goals ?Patient Stated Goal: get stronger ?PT Goal Formulation: With patient ?Time For Goal Achievement: 04/21/22 ?Potential to Achieve Goals: Fair ?Progress towards PT goals: Progressing toward goals ? ?  ?Frequency ? ? ? Min 4X/week ? ? ? ?  ?PT Plan Discharge plan needs to be updated;Frequency needs to be updated  ? ? ?Co-evaluation   ?  ?  ?  ?  ? ?  ?AM-PAC PT "6 Clicks" Mobility   ?Outcome Measure ? Help needed turning from your back to your side while in a flat bed without using bedrails?: A Little ?Help needed moving from lying on your back to sitting on the side of a flat bed without using bedrails?: A Little ?Help needed moving to and from a bed to a chair (including a wheelchair)?: A Little ?Help needed standing up from a chair using your arms (e.g., wheelchair or bedside chair)?: A Little ?Help needed to walk in hospital room?: Total ?Help needed climbing 3-5 steps with a railing? : Total ?6 Click Score: 14 ? ?  ?End of Session Equipment Utilized During Treatment: Gait belt ?Activity Tolerance: Patient tolerated treatment well ?Patient left: with call bell/phone within reach;in bed;with bed alarm set ?Nurse Communication: Mobility status ?PT Visit Diagnosis: Other abnormalities of gait and mobility (R26.89);Difficulty in walking, not elsewhere classified (R26.2);Muscle weakness (generalized) (M62.81) ?  ? ? ?Time: 7121-9758 ?PT Time Calculation (min)  (ACUTE ONLY): 37 min ? ?Charges:  $Therapeutic Activity: 8-22 mins ?$Self Care/Home Management: 8-22          ?          ? ?Thermon Leyland, SPT ?Acute Rehab Services ? ? ? ?Thermon Leyland ?04/17/2022, 12:12 PM ? ?

## 2022-04-17 NOTE — Progress Notes (Signed)
Occupational Therapy Treatment ?Patient Details ?Name: Mark Yu ?MRN: [id number] ?DOB: 02/23/1956 ?Today's Date: 04/17/2022 ? ? ?History of present illness 66 yo male smoker presented to [facility] with dyspnea.   Found to have rhinovirus, pna, COPD exacerbation. had progressive hypoxia and transferred to SDU 3/13 and started on Bipap.  Respiratory status and mental status worsened,  intubated on 3/14, Ext 3/22 and re-intubated, trach 3/24.  PMH: COPD with emphysema and recent diagnosis of extensive stage SCLC on concurrent chemoradiation therapy. ?  ?OT comments ? Patient progressing slowly but steadily and showed improved ability to perform squat pivot to BSC with Min As and stand pivot after toileting with RW and Min guard assist, compared to previous session. Pt continues to require Max-Total assistance with peri care and did not want to attempt due to fear of losing balance.  Pt spoke frequently while OT in room of fears and frustrations regarding going home and expressed wishing that his fiance had found a satisfactory postacute inpatient rehab. This OT agrees that pt would benefit from continued inpatient therapy, but current plan is home so focused on home readiness and caregiver training (to pt to relay to fiance). Patient remains limited by generalized weakness and decreased activity tolerance along with deficits noted below. Pt continues to demonstrate fair rehab potential and would benefit from continued skilled OT to increase safety and independence with ADLs and functional transfers to allow pt to return home safely and reduce caregiver burden and fall risk. ?  ? ?Recommendations for follow up therapy are one component of a multi-disciplinary discharge planning process, led by the attending physician.  Recommendations may be updated based on patient status, additional functional criteria and insurance authorization. ?   ?Follow Up Recommendations ? Skilled nursing-short term rehab (<3 hours/day) (Pt  reports that he must go home without SNF because could not locate a place with bed available to his liking. If pt goes home will recommend Home health OT/PT)  ?  ?Assistance Recommended at Discharge Frequent or constant Supervision/Assistance  ?Patient can return home with the following ? A lot of help with walking and/or transfers;A lot of help with bathing/dressing/bathroom;Assistance with cooking/housework;Assist for transportation;Help with stairs or ramp for entrance ?  ?Equipment Recommendations ? BSC/3in1 (Long handled hip kit)  ?  ?Recommendations for Other Services   ? ?  ?Precautions / Restrictions Precautions ?Precautions: Fall ?Precaution Comments: PEG ?Restrictions ?Weight Bearing Restrictions: No  ? ? ?  ? ?Mobility Bed Mobility ?Overal bed mobility: Needs Assistance ?Bed Mobility: Supine to Sit ?  ?  ?Supine to sit: Supervision, HOB elevated ?Sit to supine: Min guard ?  ?General bed mobility comments: Pt stated that he required assist to raise LEs onto bed but was able to perform with Min guard. Pt shown how to use gait belt to compensate if needed. ?  ? ?Transfers ?  ?  ?  ?  ?  ?  ?  ?  ?  ?  ?  ?  ?Balance Overall balance assessment: Needs assistance ?Sitting-balance support: Feet supported ?Sitting balance-Leahy Scale: Good ?  ?  ?Standing balance support: Bilateral upper extremity supported, During functional activity, Reliant on assistive device for balance ?Standing balance-Leahy Scale: Poor ?  ?  ?  ?  ?  ?  ?  ?  ?  ?  ?  ?  ?   ? ?ADL either performed or assessed with clinical judgement  ? ?ADL   ?  ?  ?  ?  ?  ?  ?  ?  ?  ?  ?  ?  ?  Toilet Transfer: Minimal assistance;Min guard;BSC/3in1 ?Toilet Transfer Details (indicate cue type and reason): Pt shown how to perform squat pivot from EOB>BSC with cues for sequencing and Minimal assist.  Pt then stood from [facility] to RW with Min guard and pivoted back to EOB with Min guard assistance. ?Toileting- Water quality scientist and Hygiene: Sit to/from  stand;Total assistance;Cueing for compensatory techniques ?Toileting - Clothing Manipulation Details (indicate cue type and reason): Pt educated on lining BSC bucket with plastic trash bag for ease of clean up and pt reports that he will educate his fiance on this. Pt declined to attempt hygiene stating that he is too unbalaned and therefore required Total Assist for posterior peri hygiene. ?  ?  ?  ?  ?  ? ?Extremity/Trunk Assessment   ?  ?  ?  ?  ?  ? ?Vision Patient Visual Report: No change from baseline ?  ?  ?Perception   ?  ?Praxis   ?  ? ?Cognition Arousal/Alertness: Awake/alert ?Behavior During Therapy: [facility] for tasks assessed/performed, Anxious ?Overall Cognitive Status: Within Functional Limits for tasks assessed ?  ?  ?  ?  ?  ?  ?  ?  ?  ?  ?  ?  ?  ?  ?  ?  ?General Comments: Pt anxious with all the discharge planning and pending information today. Pt expressed concern on being strong enough to thrive at home. ?  ?  ?   ?Exercises   ? ?  ?Shoulder Instructions   ? ? ?  ?General Comments    ? ? ?Pertinent Vitals/ Pain       Pain Assessment ?Pain Assessment: No/denies pain ? ?Home Living   ?  ?  ?  ?  ?  ?  ?  ?  ?  ?  ?  ?  ?  ?  ?  ?  ?  ?  ? ?  ?Prior Functioning/Environment    ?  ?  ?  ?   ? ?Frequency ? Min 2X/week  ? ? ? ? ?  ?Progress Toward Goals ? ?OT Goals(current goals can now be found in the care plan section) ? Progress towards OT goals: Progressing toward goals ? ?Acute Rehab OT Goals ?Patient Stated Goal: Continue to increase strength and endurance. ?OT Goal Formulation: With patient ?Time For Goal Achievement: 04/22/22 ?Potential to Achieve Goals: Fair  ?Plan Discharge plan remains appropriate   ? ?Co-evaluation ? ? ?   ?  ?  ?  ?  ? ?  ?AM-PAC OT "6 Clicks" Daily Activity     ?Outcome Measure ? ? Help from another person eating meals?: Total ?Help from another person taking care of personal grooming?: A Little ?Help from another person toileting, which includes using toliet, bedpan, or  urinal?: A Lot ?Help from another person bathing (including washing, rinsing, drying)?: A Lot ?Help from another person to put on and taking off regular upper body clothing?: A Little ?Help from another person to put on and taking off regular lower body clothing?: A Little ?6 Click Score: 14 ? ?  ?End of Session Equipment Utilized During Treatment: Gait belt;Rolling walker (2 wheels) ? ?OT Visit Diagnosis: Muscle weakness (generalized) (M62.81);Other abnormalities of gait and mobility (R26.89) ?  ?Activity Tolerance Patient tolerated treatment well;Patient limited by fatigue ?  ?Patient Left with call bell/phone within reach;with bed alarm set ?  ?Nurse Communication  (Rn cleared OT to see pt. Discussed d/c plans) ?  ? ?   ? ?  Time: 1407-1444 ?OT Time Calculation (min): 37 min ? ?Charges: OT General Charges ?$OT Visit: 1 Visit ?OT Treatments ?$Self Care/Home Management : 8-22 mins ?$Therapeutic Activity: 8-22 mins ? ?Jennifer, OT ?Acute Rehab Services ?Office: [phone] ?04/17/2022 ? ?Jennifer  Cason ?04/17/2022, 3:03 PM ?

## 2022-04-17 NOTE — Progress Notes (Addendum)
60 Educated patient and fiance on giving meds via PEG. Fiance [name] assisted in administration of AM meds for hands-on practice. ? ?Patient requested only 0.5mg  of xanax- other 0.5 returned to pyxis- witnessed by [facility]. ? ?1435 Reviewed discharge instructions and supplies with [name] over the phone. Expresses anxiety over being responsible for his care. Explained they do have the option of the SNF that had an open bed. [name] refused stating it was not an acceptable option. Emotional support and guidance offered. Demetra Shiner states she is watching videos on tube feedings. Expects visiting nurse tomorrow.  ? ?Discharge instructions and supplies also reviewed with patient himself. Questions answered. ?

## 2022-04-17 NOTE — Discharge Summary (Addendum)
Physician Discharge Summary  ?Mark Yu VOH:[id number] DOB: [birth year] DOA: 03/08/2022 ? ?PCP: Mark Peer, MD ? ?Admit date: 03/08/2022 ?Discharge date: 04/17/2022 ? ?Admitted From: Home ?Disposition:  Home ? ?Recommendations for Outpatient Follow-up:  ?Follow up with PCP in 1-2 weeks ?Please obtain BMP/CBC in one week ?He will go home with home health and physical therapy. ?He was to follow-up with pulmonary in 2 to 4 weeks. ? ?Home Health:Yes ?Equipment/Devices: Bedside commode and wheelchair among others  ? ?Discharge Condition:Guarded ?CODE STATUS:Full ?Diet recommendation: Heart Healthy  ? ?Brief/Interim Summary: ? 66 y.o. 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  Patient is currently getting radiation therapy which seems to be helping his pulmonary status overall [facility] but denied with acute going speech difficulties speech evaluated the patient PEG tube placed on 04/03/2022 started feeding on 4 8 ?  ?  ?  ?Significant Events: ?3/12 Admit with dyspnea, AECOPD in setting of rhinovirus  ?3/14 PCCM consulted for evaluation of respiratory failure. Intubated for airway protection  ?3/17 On vent, XRT  ?3/18 tolerating pressure support today ?3/19 not weaning as well today with every episode of bronchospasms ?3/20 PRBC for hgb of 6.6, PSV wean & extubated.  Failed due to global weakness/inability to clear secretions > reintubated.  ?Tracheal aspirate  negative normal flora ?3/22 extubated. D/w family and pt trach vs one-way extubation. Pt was non-committal. Was on NIPPV for about 8 hrs then had to be re-intubated ?3/23 vanc and cefepime started. On-going family and pt discussion. He agreed to trach  ?Tracheal aspirate-no growth ?MRSA PCR negative ?03/20/22- TRACH - Mark Yu and Mark Yu ?03/21/22 -status post tracheostomy 40% FiO2 10 L oxygen.  Currently on trach collar.  Overnight was on ventilator.  Also on fentanyl infusion and Precedex infusion.  Nursing reports that he gets very anxious when trying to wean.  Nevertheless he sitting with tracheostomy and typing on the keyboard and asking appropriate questions.  On antibiotics cefepime.  On Jevity tube feeds.  Afebrile since 03/18/2022.  White count is coming down. ?3/27 refused XRT again today ?3/28 stopped cefepime ?3/30 ATC daytime, Required increased oxygen overnight and placed back on vent ?4/10 Episode of AF/RVR, started back on amiodarone drip ?4/11 PCCM following on floor for trach, episode of hypoxia today, has RUL PNA, started on Cefepime ?4/13 No major issues over the last couple days. He remains on 8L ATC  ?4/17 No respiratory issues over the weekend, capping trial started ?4/18 tolerated capping trial well overnight ?  ? ?Discharge Diagnoses:  ?Principal Problem: ?  Respiratory failure ([place]) ?Active Problems: ?  COPD exacerbation (Mark Yu) ?  Tracheostomy dependent (Mark Yu) ?  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  Metastasis to lymph nodes (Mark Yu) ?  Anxiety ?  Anemia associated with chemotherapy ?  Essential hypertension ?  Protein-calorie malnutrition, severe ?  Depression ?  Physical deconditioning ?  PAF (paroxysmal atrial fibrillation) (Mark Yu) ?  Pneumonia of right upper lobe due to infectious  organism ? ?Acute on chronic respiratory failure with hypoxia and hypercarbia due to COPD exacerbation status post trach : ?Due to rhinovirus infection leading to COPD exacerbation requiring ICU  admission and intubation 03/10/2022 status post tracheostomy on 03/20/2022 trach has been removed by pulmonary critical care he is currently requiring 1 L of oxygen to keep saturations greater than 91%. ?He will continue inhalers as an outpatient. ? ?Right upper lobe pneumonia: ?Newly diagnosed on 04/07/2022 he was started empirically on IV antibiotics and MRSA swab was negative he completed his 5-day course of antibiotics in house. ?MBS was done that showed he is severe risk for aspiration PEG tube was placed in the ICU which should continue PEG tube feedings as an outpatient. ? ?Small cell carcinoma/metastatic lymph nodes: ?Currently receiving radiation therapy will need to follow-up with [facility] oncology as an outpatient. ? ?Anemia of chronic disease: ?With a hemoglobin baseline of 9 FOBT was positive PET scan from January showed hypermetabolic in the proximal colon he has never had a colonoscopy. ?Due to this this Lovenox was discontinued. ?GI was consulted on 04/12/2022 who recommended no inpatient work-up need to follow-up with them as an outpatient. ?His hemoglobin dropped to 6.7 he was transfused 2 units of packed red blood cells on the day of discharge hemoglobin was 10 and no signs of overt bleeding. ? ?Transient atrial fibrillation: ?Received short course of amiodarone now in sinus rhythm not a candidate for anticoagulation due to high risk of GI bleed and positive FOBT will need to follow-up with GI as an outpatient. ? ?Essential hypertension: ?Continue Norvasc. ? ?Anxiety/depression: ?Continue Xanax as needed. ? ?Severe protein caloric malnutrition: ?We will continue tube feedings as an outpatient. ?He status post PEG placement on 04/03/2022 due to decreased oral intake, will continue free water as an outpatient and reevaluated by speech as an outpatient. ? ?Gross cervical esophageal dysphagia: ?Evaluated by speech they recommended PEG tube which was placed. ?He is at high risk of aspiration we will continue  tube feedings and outpatient well with free water. ?We will need to be reevaluated by speech. ? ?Hyponatremia: ?Resolved. ? ?Hypokalemia: ?Repleted orally now now resolved. ? ?Severe physical deconditioning: ?Physical therapy and outpatient therapy evaluated the patient recommended skilled nursing facility the patient and girlfriend insisted on going home despite multiple attempts try to convince him to go to skilled nursing facility. ?They wish to go home with home health. ? ?Medial vertebral column unstageable pressure ulcer not present on admission: ? ? ? ?Discharge Instructions ? ?Discharge Instructions   ? ? Diet - low sodium heart healthy   Complete by: As directed ?  ? Increase activity slowly   Complete by: As directed ?  ? No wound care   Complete by: As directed ?  ? ?  ? ?Allergies as of 04/17/2022   ? ?   Reactions  ? Amoxicillin Rash  ? ?  ? ?  ?Medication List  ?  ? ?STOP taking these medications   ? ?acetaminophen 500 MG tablet ?Commonly known as: TYLENOL ?  ?methylPREDNISolone 4 MG Tbpk tablet ?Commonly known as: MEDROL DOSEPAK ?  ?sucralfate 1 g tablet ?Commonly known as: Carafate ?  ? ?  ? ?TAKE these medications   ? ?albuterol 108 (90 Base) MCG/ACT inhaler ?Commonly known as: VENTOLIN HFA ?Inhale 2 puffs into the lungs every 4 (four) hours as needed for wheezing or shortness of breath. ?What changed: Another medication with the same name was removed. Continue taking this medication, and follow  the directions you see here. ?  ?ALPRAZolam 1 MG tablet ?Commonly known as: Xanax ?Take 0.5-1 tablets (0.5-1 mg total) by mouth 2 (two) times daily as needed for anxiety. ?  ?amLODipine 5 MG tablet ?Commonly known as: NORVASC ?Take 5 mg by mouth daily. ?  ?Breztri Aerosphere 160-9-4.8 MCG/ACT Aero ?Generic drug: Budeson-Glycopyrrol-Formoterol ?Inhale 2 puffs into the lungs in the morning and at bedtime. ?  ?Calcium Carb-Cholecalciferol 500-10 MG-MCG Tabs ?Take 1 tablet by mouth daily. ?  ?cholecalciferol 25  MCG (1000 UNIT) tablet ?Commonly known as: VITAMIN D ?Take 1,000 Units by mouth daily. ?  ?feeding supplement (PROSource TF) liquid ?Place 45 mLs into feeding tube 3 (three) times daily. ?  ?feeding supplemen

## 2022-04-20 ENCOUNTER — Ambulatory Visit: Payer: Medicare Other | Admitting: Physician Assistant

## 2022-04-20 ENCOUNTER — Ambulatory Visit: Payer: Medicare Other

## 2022-04-20 ENCOUNTER — Other Ambulatory Visit: Payer: Medicare Other

## 2022-04-21 ENCOUNTER — Other Ambulatory Visit: Payer: Self-pay | Admitting: Primary Care

## 2022-04-21 ENCOUNTER — Encounter: Payer: Self-pay | Admitting: *Deleted

## 2022-04-21 NOTE — Progress Notes (Signed)
Oncology Nurse Navigator Documentation ? ? ?  04/21/2022  ?  9:00 AM 04/01/2022  ?  3:00 PM 03/27/2022  ? 11:00 AM 03/23/2022  ?  4:00 PM 02/09/2022  ?  4:00 PM 02/04/2022  ?  9:00 AM 01/14/2022  ?  8:00 AM  ?Oncology Nurse Navigator Flowsheets  ?Confirmed Diagnosis Date      02/02/2022   ?Diagnosis Status     Confirmed Diagnosis Complete Pathology Pending   ?Planned Course of Treatment    Chemo/Radiation Concurrent     ?Phase of Treatment    Radiation     ?Chemotherapy Actual Start Date:    02/18/2022     ?Radiation Actual Start Date:    02/17/2022     ?Navigator Follow Up Date: 04/27/2022 04/08/2022 04/02/2022 03/26/2022 02/12/2022 02/09/2022 01/23/2022  ?Navigator Follow Up Reason: Follow-up Appointment Other: Review Note Appointment Review Appointment Review Follow-up After Biopsy Radiology  ?Navigator Location [facility]  ?Navigator Encounter Type Appt/Treatment Plan Review Appt/Treatment Plan Review Appt/Treatment Plan Review Appt/Treatment Plan Review Clinic/[facility] Telephone Other:  ?Telephone      Outgoing Call   ?Patient Visit Type     MedOnc Other   ?Treatment Phase    Treatment Pre-Tx/Tx Discussion Pre-Tx/Tx Discussion Abnormal Scans  ?Barriers/Navigation Needs Coordination of Care/I followed up and noticed Mark Yu has been discharged from the hospital.  He is set up for a follow up with Dr. Julien Nordmann on 5/1.  Coordination of Care Coordination of Care Coordination of Care Education Coordination of Care;Education Coordination of Care  ?Education     Newly Diagnosed Cancer Education;Other Other   ?Interventions Coordination of Care Coordination of Care Coordination of Care Coordination of Care Coordination of Care;Psycho-Social Support Coordination of Care;Education;Psycho-Social Support Coordination of Care  ?Acuity Level 2-Minimal Needs (1-2 Barriers Identified) Level 2-Minimal Needs (1-2 Barriers Identified) Level  2-Minimal Needs (1-2 Barriers Identified) Level 2-Minimal Needs (1-2 Barriers Identified) Level 3-Moderate Needs (3-4 Barriers Identified) Level 2-Minimal Needs (1-2 Barriers Identified) Level 2-Minimal Needs (1-2 Barriers Identified)  ?Coordination of Care Other Other Appts   Appts Other  ?Education Method     Verbal;Other Verbal   ?Time Spent with Patient 30 30 30 30 30 30 15   ?  ?

## 2022-04-22 ENCOUNTER — Ambulatory Visit: Payer: Medicare Other

## 2022-04-22 NOTE — Telephone Encounter (Signed)
It is fine

## 2022-04-22 NOTE — Telephone Encounter (Signed)
Beth, please advise on refill request. ?

## 2022-04-23 ENCOUNTER — Telehealth: Payer: Self-pay | Admitting: Emergency Medicine

## 2022-04-23 ENCOUNTER — Telehealth: Payer: Self-pay | Admitting: Internal Medicine

## 2022-04-23 ENCOUNTER — Ambulatory Visit: Payer: Medicare Other

## 2022-04-23 NOTE — Telephone Encounter (Signed)
Informed pt that ND recommended HFU. Pt did not want to come in as he has been bed ridden since leaving the hospital. Offered to do MyChart and explained how the visit would work, and pt didnt think he would be able to do that as he has difficulty working Geneticist, molecular. Please advise.  ?

## 2022-04-23 NOTE — Telephone Encounter (Signed)
R/s appt per 4/27 sch msg, pt has been called and said to schedule him at the end of may but he may not be able to come if he goes to inpatient rehab, told pt to call back if they need to r/s again ?

## 2022-04-23 NOTE — Telephone Encounter (Signed)
Called patient but he did not answer. Left message for him to call back.  

## 2022-04-23 NOTE — Telephone Encounter (Signed)
Called and spoke with patient. He stated that he had been in the hospital for 6 weeks and discharged last Friday 04/17/22. He had been doing well at home with completing physical therapy and trying to get his strength back. He noticed yesterday that his cough has changed. It has returned to a deep cough where he is unable to cough up any phlegm. He has been taking Mucinex but he has been struggling with staying hydrated in order for it to work. Denied any fever or increased SOB. He is concerned because he felt like this before he went to the hospital.  ? ?He wanted to know if there was anything else he could do for the cough besides the Mucinex. He was scared to take anything else with everything he has going on. He did mention that he has swallowing problems. He still has the peg tube.  ? ?[facility] [address].  ? ?Dr. Shearon Stalls, can you please advise since Dr. Lamonte Sakai is not available today? Thanks!   ?

## 2022-04-23 NOTE — Telephone Encounter (Signed)
Called and spoke with patient. He stated that his phone does not have a camera nor does he have an computer with a camera on it. I explained to him that he will some type of office visit for further evaluation. I advised him to take either some Robtussin or Delsym which are both available over the counter. He verbalized understanding.  ?

## 2022-04-23 NOTE — Telephone Encounter (Signed)
Needs hospital follow up visit. Please schedule with APP. Can be virtual if necessary ?

## 2022-04-25 ENCOUNTER — Telehealth: Payer: Self-pay | Admitting: Pulmonary Disease

## 2022-04-25 NOTE — Telephone Encounter (Signed)
spoke to Mark Yu family.  Patient bedridden since discharge.  very SOB with exertion.  hypoxic with exertion.  suggested reporting to the ED.   ?

## 2022-04-27 ENCOUNTER — Inpatient Hospital Stay: Payer: Medicare Other | Admitting: Internal Medicine

## 2022-04-27 ENCOUNTER — Inpatient Hospital Stay: Payer: Medicare Other

## 2022-04-28 ENCOUNTER — Other Ambulatory Visit (HOSPITAL_COMMUNITY): Payer: Self-pay

## 2022-04-28 ENCOUNTER — Other Ambulatory Visit: Payer: Self-pay | Admitting: Internal Medicine

## 2022-04-28 NOTE — Progress Notes (Unsigned)
{  Select_TRH_Note:[id number]} 

## 2022-04-30 ENCOUNTER — Telehealth (INDEPENDENT_AMBULATORY_CARE_PROVIDER_SITE_OTHER): Payer: Medicare Other | Admitting: Emergency Medicine

## 2022-04-30 ENCOUNTER — Encounter: Payer: Self-pay | Admitting: Emergency Medicine

## 2022-04-30 ENCOUNTER — Telehealth: Payer: Self-pay | Admitting: Emergency Medicine

## 2022-04-30 ENCOUNTER — Telehealth: Payer: Self-pay

## 2022-04-30 DIAGNOSIS — J441 Chronic obstructive pulmonary disease with (acute) exacerbation: Secondary | ICD-10-CM

## 2022-04-30 MED ORDER — PREDNISONE 10 MG PO TABS: ORAL_TABLET | ORAL | 0 refills | Status: DC

## 2022-04-30 MED ORDER — AZITHROMYCIN 250 MG PO TABS: ORAL_TABLET | ORAL | 0 refills | Status: DC

## 2022-04-30 NOTE — Progress Notes (Signed)
Virtual Visit via Video Note ? ?I connected with Mark Yu on 04/30/22 at  2:30 PM EDT by a video enabled telemedicine application and verified that I am speaking with the correct person using two identifiers. ? ?Location: ?Patient: Home ?Provider: Office ?  ?I discussed the limitations of evaluation and management by telemedicine and the availability of in person appointments. The patient expressed understanding and agreed to proceed. ? ?History of Present Illness: ?Mark Yu is 35, has a history of tobacco use, severe COPD, limited stage small cell lung cancer of the left lung with last chemotherapy mid-March. Will see Oncology next week. His radiation is finished.  Long complicated hospitalization through March and April due to acute respiratory failure in the setting of an acute exacerbation of his COPD from rhinovirus.  Treated for associated bacterial pneumonia.  He underwent tracheostomy on 3/24 to facilitate vent weaning.  He was able to be decannulated on 04/15/2022.  He had a PEG tube placed with chronic dysphagia.  He went home with severe protein calorie malnutrition and deconditioning ? ?Observations/Objective: ?He reports that he has been swallowing some liquids, is mostly NPO.  ?Breztri reliably. Uses albuterol occasionally. Unsure whether he is getting albuterol HFA reliably.  ?On protonix, he does rarely have reflux sx.  ?He is using mucinex, flonase. A lot of clear mucous. ? Whether he has pooling secretions.  ? ?Assessment and Plan: ?Severe COPD with recent exacerbation, respiratory failure.  He still has cough, clear secretions, associated shortness of breath when he has coughing paroxysmal's.  The secretions are at least in part saliva, question whether there is a component of impaired upper airway coordination, protection.  I think would be reasonable to treat him for an acute exacerbation given the cough, dyspnea.  We will give him prednisone taper, azithromycin.  Continue his usual  respiratory and albuterol either by nebulizer or by [facility]. ? ?Follow Up Instructions: ?-continue speech therapy, PT/OT ?-Prednisone taper ?-Azithromycin ?-Breztri and albuterol as ordered ?-Follow-up either in person or by video visit in 2 weeks with APP or RB ?  ?I discussed the assessment and treatment plan with the patient. The patient was provided an opportunity to ask questions and all were answered. The patient agreed with the plan and demonstrated an understanding of the instructions. ?  ?The patient was advised to call back or seek an in-person evaluation if the symptoms worsen or if the condition fails to improve as anticipated. ? ?I provided 30 minutes of non-face-to-face time during this encounter. ? ? ?Collene Gobble, MD  ?

## 2022-04-30 NOTE — Telephone Encounter (Signed)
Mark Yu needs directions for azithromycin prescription sent in, cannot dispense until they get clarification. Call back number is [phone] ? ?Please advise.  ?

## 2022-04-30 NOTE — Telephone Encounter (Signed)
Spoke with patient and scheduled a telephonic Palliative Consult for 05/04/22 @ 12:30 PM.  ? ?Consent obtained; updated Netsmart, Team List and Epic.  ? ?

## 2022-04-30 NOTE — Telephone Encounter (Signed)
Called and spoke with pt about the phone call we received from [place] with [facility]. Pt said that he is 2 weeks post hospital and has been doing physical therapy. Sats have been dropping to 80-85% and pt has also had problems with congestion and coughing but states it is hard to get phelgm up even with taking mucinex. ? ? ?Stated to pt that we should get him scheduled for a video visit to further assess and he verbalized understanding. Appt scheduled for pt with RB. Nothing further needed. ?

## 2022-05-01 NOTE — Telephone Encounter (Signed)
Called pt's pharmacy and spoke with Anderson Malta clarifying instructions for pt's azithromycin letting her know that it was supposed to be zpak with instructions to take 2 day one and then 1 daily until finished. Anderson Malta verbalized understanding.nothing further needed. ?

## 2022-05-04 ENCOUNTER — Other Ambulatory Visit: Payer: Medicare Other | Admitting: Family Medicine

## 2022-05-04 ENCOUNTER — Telehealth: Payer: Self-pay | Admitting: Family Medicine

## 2022-05-04 ENCOUNTER — Encounter: Payer: Self-pay | Admitting: Internal Medicine

## 2022-05-04 NOTE — Telephone Encounter (Signed)
TCT patient to discuss [facility] Palliative Referral.  Pt states he is "not sure why I was referred to Palliative Care. They just told me it was something that might be helpful for me."  He states he went into the hospital for pneumonia, was sent to ICU with respiratory failure, trached and placed on a vent.  He spent 2 months in the hospital and is still primarily stuck in the bed.  He also said he has lung cancer being treated with chemo and radiation, although chemotherapy is on hold for right now.  He has a feeding tube and does not know how long this will be present.  He now has home care through [facility], has PT, OT and a walker  but becomes fatigued very easily, has chest congestion although his lungs are clear and sats are at 95% even with activity. He becomes very dyspneic on exertion. Advised  of purpose of Palliative to help with advanced care planning and helping identify his and his family's goals of care, with symptom management in tandem with home care and providers.  Advised of after hours coverage for help with symptom management and he states he doesn't know what Palliative can offer that his home health doesn't already provide.   Pt states he is wanting to improve, has a feeding tube and catheter currently, starting to move around more but is frustrated by the time it is taking.  Advised that this is not uncommon after a prolonged debilitating illness and empathized with his discomfort. Doesn't have a PCP, Dr Leota Sauers is the Urgent Care doctor he saw.  Advised pt if he changes his mind and wants some assistance from Palliative and explained that once a patient declines significantly he may qualify for Hospice level of care but patients are not receiving chemotherapy and are mainly focused on comfort care at that time.  Advised either way if he was interested in Palliative or Hospice Services that he could self refer or provider could refer and he asked that this information be sent with the  phone number to his email address.  Confirmed email and sent.  Wished him well in improving his health and quality of life. ?Damaris Hippo FNP-C   ?

## 2022-05-04 NOTE — Progress Notes (Signed)
? ?                                                                                                                                                          ?  Patient Name: Mark Yu ?MRN: [id number] ?DOB: 04-17-1956 ?Referring Physician: Curt Bears (Profile Not Attached) ?Date of Service: 04/14/2022 ? [facility] [place] ? ?                                                      End Of Treatment Note ? ?Diagnoses: C34.11-Malignant neoplasm of upper lobe, right bronchus or lung ?C34.12-Malignant neoplasm of upper lobe, left bronchus or lung ? ?Cancer Staging: Extensive stage small cell carcinoma of the left upper lobe with metastatic disease to the right upper lobe ? ?Intent: Curative ? ?Radiation Treatment Dates: 02/17/2022 through 04/14/2022 ?Site Technique Total Dose (Gy) Dose per Fx (Gy) Completed Fx Beam Energies  ?Lung, Left: Lung_L 3D 60/60 2 30/30 6X  ?Lung, Left: Lung_L_Bst 3D 6/6 2 3/3 6X  ? ?Narrative: The patient tolerated radiation therapy relatively well early on in his treatment, however had a prolonged course of recovery due to viral respiratory illness. He finished radiation while he was inpatient for this.  ? ?Plan: The patient will receive a call in about one month from the radiation oncology department. He will continue follow up with Dr. Julien Nordmann as well. We will discuss PCI at that time as well to see if he has concluded systemic therapy. He also had a PET positive lesion in the sigmoid colon prior to starting therapy. This will be followed also with medical oncology. ? ?________________________________________________ ? ? ? ?Carola Rhine, PAC  ?

## 2022-05-06 ENCOUNTER — Encounter: Payer: Self-pay | Admitting: *Deleted

## 2022-05-06 NOTE — Progress Notes (Signed)
Oncology Nurse Navigator Documentation ? ? ?  05/06/2022  ? 10:00 AM 04/21/2022  ?  9:00 AM 04/01/2022  ?  3:00 PM 03/27/2022  ? 11:00 AM 03/23/2022  ?  4:00 PM 02/09/2022  ?  4:00 PM 02/04/2022  ?  9:00 AM  ?Oncology Nurse Navigator Flowsheets  ?Confirmed Diagnosis Date       02/02/2022  ?Diagnosis Status      Confirmed Diagnosis Complete Pathology Pending  ?Planned Course of Treatment     Chemo/Radiation Concurrent    ?Phase of Treatment     Radiation    ?Chemotherapy Actual Start Date:     02/18/2022    ?Radiation Actual Start Date:     02/17/2022    ?Navigator Follow Up Date: 05/26/2022 04/27/2022 04/08/2022 04/02/2022 03/26/2022 02/12/2022 02/09/2022  ?Navigator Follow Up Reason: Follow-up Appointment Follow-up Appointment Other: Review Note Appointment Review Appointment Review Follow-up After Biopsy  ?Navigator Location [facility]  ?Navigator Encounter Type Appt/Treatment Plan Review Appt/Treatment Plan Review Appt/Treatment Plan Review Appt/Treatment Plan Review Appt/Treatment Plan Review Clinic/[facility] Telephone  ?Telephone       Outgoing Call  ?Patient Visit Type      MedOnc Other  ?Treatment Phase     Treatment Pre-Tx/Tx Discussion Pre-Tx/Tx Discussion  ?Barriers/Navigation Needs Coordination of Care Coordination of Care Coordination of Care Coordination of Care Coordination of Care Education Coordination of Care;Education  ?Education      Newly Diagnosed Cancer Education;Other Other  ?Interventions Coordination of Care/I followed up on patients schedule with Dr. Julien Nordmann. He is set up for a follow up on 5/30.   Coordination of Care Coordination of Care Coordination of Care Coordination of Care Coordination of Care;Psycho-Social Support Coordination of Care;Education;Psycho-Social Support  ?Acuity Level 2-Minimal Needs (1-2 Barriers Identified) Level 2-Minimal Needs (1-2 Barriers Identified) Level 2-Minimal Needs (1-2  Barriers Identified) Level 2-Minimal Needs (1-2 Barriers Identified) Level 2-Minimal Needs (1-2 Barriers Identified) Level 3-Moderate Needs (3-4 Barriers Identified) Level 2-Minimal Needs (1-2 Barriers Identified)  ?Coordination of Care Other Other Other Appts   Appts  ?Education Method      Verbal;Other Verbal  ?Time Spent with Patient 30 30 30 30 30 30 30   ?  ?

## 2022-05-15 ENCOUNTER — Encounter: Payer: Self-pay | Admitting: Emergency Medicine

## 2022-05-15 ENCOUNTER — Telehealth (INDEPENDENT_AMBULATORY_CARE_PROVIDER_SITE_OTHER): Payer: Medicare Other | Admitting: Emergency Medicine

## 2022-05-15 DIAGNOSIS — J9611 Chronic respiratory failure with hypoxia: Secondary | ICD-10-CM

## 2022-05-15 MED ORDER — PREDNISONE 10 MG PO TABS: 10.0000 mg | ORAL_TABLET | Freq: Every day | ORAL | 0 refills | Status: DC

## 2022-05-15 NOTE — Patient Instructions (Signed)
Continue Breztri and albuterol as we have been using Start maintenance prednisone 10 mg daily and see if he gets clinical benefit. Continue to follow with speech therapy.  He will likely need a formal swallowing evaluation soon Continue tube feeding, PEG tube for now.  Hopefully he can consider having it removed once he is no longer dealing with dysphagia Needs follow-up with oncology to consider resumption of his chemotherapy Continue PT/OT Follow-up next week as already planned

## 2022-05-15 NOTE — Addendum Note (Signed)
Addended by: Gavin Potters R on: 05/15/2022 04:40 PM   Modules accepted: Orders

## 2022-05-15 NOTE — Progress Notes (Signed)
Virtual Visit via Video Note  I connected with Mark Yu on 05/15/22 at  4:00 PM EDT by a video enabled telemedicine application and verified that I am speaking with the correct person using two identifiers.  Location: Patient: Home Provider: Office   I discussed the limitations of evaluation and management by telemedicine and the availability of in person appointments. The patient expressed understanding and agreed to proceed.  History of Present Illness: 66 year old male with a history of tobacco use, severe COPD.  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  He was treated with chemotherapy and radiation therapy. Long complicated hospitalization March-April 2023 due to acute respiratory failure in setting of acute exacerbation of COPD, bacterial pneumonia.  He underwent tracheostomy and was finally decannulated 04/15/2022.  PEG was placed for severe protein calorie malnutrition. I saw him 2 weeks ago and treated him for possible acute exacerbation with prednisone taper, azithromycin.     Observations/Objective: He reports that he benefited from the pred and abx. Now developing more chest tightness, cough, sputum. Sputum is clear. He is not desaturating.   Currently managed on Breztri and albuterol Receiving PT/OT, speech therapy >> he is walking more w PT, making some progress.  He is concerned about his PEG - still gets Osmolite through it. SLP stillnot sure he can move to standard PO diet.   Assessment and Plan: Start pred 10 Breztri + albuterol  SLP eval Continue TF and Boost. Needs a repeat swallow eval   PT/OT  Follow Up Instructions: Follow up next week as planned.    I discussed the assessment and treatment plan with the patient. The patient was provided an opportunity to ask questions and all were answered. The patient agreed with the plan and demonstrated an understanding of the instructions.   The patient was advised to call back or seek an  in-person evaluation if the symptoms worsen or if the condition fails to improve as anticipated.  I provided 31 minutes of non-face-to-face time during this encounter.   Collene Gobble, MD

## 2022-05-20 ENCOUNTER — Telehealth (INDEPENDENT_AMBULATORY_CARE_PROVIDER_SITE_OTHER): Payer: Medicare Other | Admitting: Emergency Medicine

## 2022-05-20 ENCOUNTER — Encounter: Payer: Self-pay | Admitting: Emergency Medicine

## 2022-05-20 ENCOUNTER — Ambulatory Visit: Payer: Medicare Other | Admitting: Emergency Medicine

## 2022-05-20 DIAGNOSIS — E43 Unspecified severe protein-calorie malnutrition: Secondary | ICD-10-CM

## 2022-05-20 DIAGNOSIS — R5381 Other malaise: Secondary | ICD-10-CM

## 2022-05-20 DIAGNOSIS — J441 Chronic obstructive pulmonary disease with (acute) exacerbation: Secondary | ICD-10-CM

## 2022-05-20 DIAGNOSIS — C3412 Malignant neoplasm of upper lobe, left bronchus or lung: Secondary | ICD-10-CM

## 2022-05-20 NOTE — Assessment & Plan Note (Signed)
Benefiting from the addition of maintenance prednisone.  We will plan to continue 10 mg a day, refill this today.  I like to continue it at least until he is stronger, has his PEG tube out, has completed his chemotherapy.  I will reassess him in 3 months and see if we can possibly come off.  Continue his bronchodilator regimen as ordered

## 2022-05-20 NOTE — Assessment & Plan Note (Signed)
Plans to follow-up with Dr. Julien Nordmann next week.  He has only completed part of his planned chemotherapy regimen.  Suspect he will restart

## 2022-05-20 NOTE — Assessment & Plan Note (Signed)
With significant dysphagia.  He is currently dependent on PEG feeds.  He needs a repeat swallowing evaluation to see if he can be cleared for an oral diet.  Needs to be cleared and then have good intake before considering DC peg

## 2022-05-20 NOTE — Assessment & Plan Note (Signed)
PT, OT

## 2022-05-20 NOTE — Progress Notes (Signed)
Virtual Visit via Video Note  I connected with Mark Yu on 05/20/22 at 12:00 PM EDT by a video enabled telemedicine application and verified that I am speaking with the correct person using two identifiers.  Location: Patient: Home Provider: Office   I discussed the limitations of evaluation and management by telemedicine and the availability of in person appointments. The patient expressed understanding and agreed to proceed.  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   Observations/Objective: I saw him on the 19th and started him on prednisone 10 mg daily as maintenance.  He is also on Breztri and albuterol. Today he reports that he believes the prednisone has helped his breathing. Lungs feels clearer - no deep cough.  He feels like he has some upper airway irritation with a productive cough, mucous from his throat.  He has a "frog in his throat". He remains on Breztri.  He is using albuterol approximately 2x a day. He is using pantoprazole, but needs it changed to a liquid.   He sees Dr Julien Nordmann next week Needs a repeat swallow eval to see if we can progress PO intake.   Assessment and Plan: COPD (chronic obstructive pulmonary disease) ([place]) Benefiting from the addition of maintenance prednisone.  We will plan to continue 10 mg a day, refill this today.  I like to continue it at least until he is stronger, has his PEG tube out, has completed his chemotherapy.  I will reassess him in 3 months and see if we can possibly come off.  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 Julien Nordmann next week.  He has only completed part of his planned chemotherapy regimen.   Suspect he will restart  Physical deconditioning PT, OT  Protein-calorie malnutrition, severe With significant dysphagia.  He is currently dependent on PEG feeds.  He needs a repeat swallowing evaluation to see if he can be cleared for an oral diet.  Needs to be cleared and then have good intake before considering DC peg   Follow Up Instructions: 3 months   I discussed the assessment and treatment plan with the patient. The patient was provided an opportunity to ask questions and all were answered. The patient agreed with the plan and demonstrated an understanding of the instructions.   The patient was advised to call back or seek an in-person evaluation if the symptoms worsen or if the condition fails to improve as anticipated.  I provided 25 minutes of non-face-to-face time during this encounter.   Collene Gobble, MD

## 2022-05-26 ENCOUNTER — Other Ambulatory Visit: Payer: Self-pay | Admitting: Medical Oncology

## 2022-05-26 ENCOUNTER — Inpatient Hospital Stay: Payer: Medicare Other | Admitting: Internal Medicine

## 2022-05-26 ENCOUNTER — Other Ambulatory Visit: Payer: Self-pay

## 2022-05-26 ENCOUNTER — Inpatient Hospital Stay: Payer: Medicare Other | Attending: Internal Medicine

## 2022-05-26 VITALS — BP 123/81 | HR 107 | Temp 97.6°F | Resp 16 | Wt 105.9 lb

## 2022-05-26 DIAGNOSIS — C3411 Malignant neoplasm of upper lobe, right bronchus or lung: Secondary | ICD-10-CM | POA: Insufficient documentation

## 2022-05-26 DIAGNOSIS — R5383 Other fatigue: Secondary | ICD-10-CM | POA: Diagnosis not present

## 2022-05-26 DIAGNOSIS — Z95828 Presence of other vascular implants and grafts: Secondary | ICD-10-CM

## 2022-05-26 DIAGNOSIS — Z7952 Long term (current) use of systemic steroids: Secondary | ICD-10-CM | POA: Insufficient documentation

## 2022-05-26 DIAGNOSIS — Z79899 Other long term (current) drug therapy: Secondary | ICD-10-CM | POA: Insufficient documentation

## 2022-05-26 DIAGNOSIS — C3490 Malignant neoplasm of unspecified part of unspecified bronchus or lung: Secondary | ICD-10-CM | POA: Diagnosis not present

## 2022-05-26 DIAGNOSIS — F419 Anxiety disorder, unspecified: Secondary | ICD-10-CM | POA: Diagnosis not present

## 2022-05-26 DIAGNOSIS — F17210 Nicotine dependence, cigarettes, uncomplicated: Secondary | ICD-10-CM | POA: Insufficient documentation

## 2022-05-26 DIAGNOSIS — C3412 Malignant neoplasm of upper lobe, left bronchus or lung: Secondary | ICD-10-CM | POA: Insufficient documentation

## 2022-05-26 DIAGNOSIS — I10 Essential (primary) hypertension: Secondary | ICD-10-CM | POA: Insufficient documentation

## 2022-05-26 DIAGNOSIS — R059 Cough, unspecified: Secondary | ICD-10-CM | POA: Insufficient documentation

## 2022-05-26 LAB — CBC WITH DIFFERENTIAL (CANCER CENTER ONLY)
Abs Immature Granulocytes: 0.02 10*3/uL (ref 0.00–0.07)
Basophils Absolute: 0 10*3/uL (ref 0.0–0.1)
Basophils Relative: 0 %
Eosinophils Absolute: 0 10*3/uL (ref 0.0–0.5)
Eosinophils Relative: 0 %
HCT: 34 % — ABNORMAL LOW (ref 39.0–52.0)
Hemoglobin: 11.1 g/dL — ABNORMAL LOW (ref 13.0–17.0)
Immature Granulocytes: 0 %
Lymphocytes Relative: 11 %
Lymphs Abs: 0.6 10*3/uL — ABNORMAL LOW (ref 0.7–4.0)
MCH: 30.4 pg (ref 26.0–34.0)
MCHC: 32.6 g/dL (ref 30.0–36.0)
MCV: 93.2 fL (ref 80.0–100.0)
Monocytes Absolute: 0.4 10*3/uL (ref 0.1–1.0)
Monocytes Relative: 8 %
Neutro Abs: 4.4 10*3/uL (ref 1.7–7.7)
Neutrophils Relative %: 81 %
Platelet Count: 311 10*3/uL (ref 150–400)
RBC: 3.65 MIL/uL — ABNORMAL LOW (ref 4.22–5.81)
RDW: 17.9 % — ABNORMAL HIGH (ref 11.5–15.5)
WBC Count: 5.5 10*3/uL (ref 4.0–10.5)
nRBC: 0 % (ref 0.0–0.2)

## 2022-05-26 MED ORDER — HEPARIN SOD (PORK) LOCK FLUSH 100 UNIT/ML IV SOLN
500.0000 [IU] | Freq: Once | INTRAVENOUS | Status: AC
  Administered 2022-05-26: 500 [IU]

## 2022-05-26 MED ORDER — SODIUM CHLORIDE 0.9% FLUSH
10.0000 mL | Freq: Once | INTRAVENOUS | Status: AC
  Administered 2022-05-26: 10 mL

## 2022-05-26 NOTE — Progress Notes (Signed)
[facility] [phone]) [phone]   Fax:([phone]  OFFICE PROGRESS NOTE  Chow, Bebe Shaggy, MD [place] [address]  DIAGNOSIS: Limited stage (T1c, N1, M1a) small cell lung cancer presented with left upper lobe lung nodule as well as left hilar lymphadenopathy as well as right upper lobe lung nodule diagnosed in February 2023  PRIOR THERAPY: None  CURRENT THERAPY: Systemic chemotherapy with cisplatin 80 Mg/M2 on day 1 and 2 etoposide 100 Mg/M2 on days 1, 2 and 3 concurrent with radiotherapy.  First dose February 17, 2022.  Status post 1 cycle.   INTERVAL HISTORY: Roland Lipke 66 y.o. male returns to the clinic today for follow-up visit accompanied by his girlfriend.  Patient has been off treatment since March 2023 after he was admitted to the hospital with respiratory failure secondary to pneumonia.  He had a prolonged hospitalization requiring aggressive courses of antibiotics as respiratory support and sedation.  He also had a PEG tube placed for nutrition during his hospitalization.  He was discharged to a rehabilitation facility and has been there for several weeks but now at home.  He is feeling much better now with no concerning complaints except for mild fatigue.  He would like to get rid of the PEG tube but he has a swallow study next week.  He has no current chest pain but continues to have mild cough with no shortness of breath or hemoptysis.  He has no nausea, vomiting, diarrhea or constipation.  He has no recent weight loss or night sweats.  He is here today for evaluation and discussion of his treatment options after this prolonged hospitalization.  He completed a course of radiotherapy during his hospitalization.   MEDICAL HISTORY: Past Medical History:  Diagnosis Date   Anxiety    tx xanax   COPD (chronic obstructive pulmonary disease) ([place])    tx inhalers   Depression    Dyspnea    with exertion   HTN (hypertension)    tx  amlodipine   Hyponatremia 03/08/2022    ALLERGIES:  is allergic to amoxicillin.  MEDICATIONS:  Current Outpatient Medications  Medication Sig Dispense Refill   albuterol (PROVENTIL) (2.5 MG/3ML) 0.083% nebulizer solution Take 2.5 mg by nebulization every 6 (six) hours as needed for wheezing or shortness of breath.     albuterol (VENTOLIN HFA) 108 (90 Base) MCG/ACT inhaler Inhale 2 puffs into the lungs every 4 (four) hours as needed for wheezing or shortness of breath.     ALPRAZolam (XANAX) 0.5 MG tablet Take 0.5 mg by mouth 2 (two) times daily as needed.     ALPRAZolam (XANAX) 1 MG tablet TAKE 1/2 TO 1 TABLET BY MOUTH TWO TIMES A DAY AS NEEDED FOR ANXIETY 30 tablet 0   amLODipine (NORVASC) 5 MG tablet Take 5 mg by mouth daily.     BREZTRI AEROSPHERE 160-9-4.8 MCG/ACT AERO Inhale 2 puffs into the lungs in the morning and at bedtime.     Calcium Carb-Cholecalciferol 500-10 MG-MCG TABS Take 1 tablet by mouth daily.     cholecalciferol (VITAMIN D) 25 MCG (1000 UNIT) tablet Take 1,000 Units by mouth daily.     fluticasone (FLONASE) 50 MCG/ACT nasal spray Place 2 sprays into both nostrils daily as needed for allergies or rhinitis.     guaiFENesin (MUCINEX) 600 MG 12 hr tablet Take 600 mg by mouth every evening.     lidocaine-prilocaine (EMLA) cream Apply to the Port-A-Cath site 30-60 minutes  before chemotherapy 30 g 0   Nutritional Supplements (FEEDING SUPPLEMENT, OSMOLITE 1.5 CAL,) LIQD Place 1,000 mLs into feeding tube continuous.  0   Nutritional Supplements (FEEDING SUPPLEMENT, PROSOURCE TF,) liquid Place 45 mLs into feeding tube 3 (three) times daily. 30 mL 3   pantoprazole (PROTONIX) 40 MG tablet Take 40 mg by mouth daily.     pantoprazole sodium (PROTONIX) 40 mg Place 40 mg into feeding tube daily. 30 packet 3   predniSONE (DELTASONE) 10 MG tablet Take 40mg  daily for 3 days, then 30mg  daily for 3 days, then 20mg  daily for 3 days, then 10mg  daily for 3 days, then stop 30 tablet 0    predniSONE (DELTASONE) 10 MG tablet Take 1 tablet (10 mg total) by mouth daily with breakfast. 30 tablet 0   prochlorperazine (COMPAZINE) 10 MG tablet Take 1 tablet (10 mg total) by mouth every 6 (six) hours as needed for nausea or vomiting. 30 tablet 0   Water For Irrigation, Sterile (FREE WATER) SOLN Place 150 mLs into feeding tube 6 (six) times daily. 30 mL 3   No current facility-administered medications for this visit.    SURGICAL HISTORY:  Past Surgical History:  Procedure Laterality Date   BRONCHIAL BIOPSY  02/02/2022   Procedure: BRONCHIAL BIOPSIES;  Surgeon: Collene Gobble, MD;  Location: [facility] ENDOSCOPY;  Service: Pulmonary;;   BRONCHIAL BRUSHINGS  02/02/2022   Procedure: BRONCHIAL BRUSHINGS;  Surgeon: Collene Gobble, MD;  Location: [facility] ENDOSCOPY;  Service: Pulmonary;;   BRONCHIAL NEEDLE ASPIRATION BIOPSY  02/02/2022   Procedure: BRONCHIAL NEEDLE ASPIRATION BIOPSIES;  Surgeon: Collene Gobble, MD;  Location: [facility] ENDOSCOPY;  Service: Pulmonary;;   IR GASTROSTOMY TUBE MOD SED  04/03/2022   IR IMAGING GUIDED PORT INSERTION  02/24/2022   MRI     NO PAST SURGERIES     VIDEO BRONCHOSCOPY WITH ENDOBRONCHIAL ULTRASOUND N/A 02/02/2022   Procedure: VIDEO BRONCHOSCOPY WITH ENDOBRONCHIAL ULTRASOUND;  Surgeon: Collene Gobble, MD;  Location: [facility] ENDOSCOPY;  Service: Pulmonary;  Laterality: N/A;   VIDEO BRONCHOSCOPY WITH RADIAL ENDOBRONCHIAL ULTRASOUND  02/02/2022   Procedure: VIDEO BRONCHOSCOPY WITH RADIAL ENDOBRONCHIAL ULTRASOUND;  Surgeon: Collene Gobble, MD;  Location: [facility] ENDOSCOPY;  Service: Pulmonary;;    REVIEW OF SYSTEMS:  Constitutional: positive for fatigue Eyes: negative Ears, nose, mouth, throat, and face: negative Respiratory: positive for cough Cardiovascular: negative Gastrointestinal: positive for dysphagia Genitourinary:negative Integument/breast: negative Hematologic/lymphatic: negative Musculoskeletal:positive for muscle weakness Neurological: negative Behavioral/Psych:  negative Endocrine: negative Allergic/Immunologic: negative   PHYSICAL EXAMINATION: General appearance: alert, cooperative, appears older than stated age, fatigued, and no distress Head: Normocephalic, without obvious abnormality, atraumatic Neck: no adenopathy, no JVD, supple, symmetrical, trachea midline, and thyroid not enlarged, symmetric, no tenderness/mass/nodules Lymph nodes: Cervical, supraclavicular, and axillary nodes normal. Resp: clear to auscultation bilaterally Back: symmetric, no curvature. ROM normal. No CVA tenderness. Cardio: regular rate and rhythm, S1, S2 normal, no murmur, click, rub or gallop GI: soft, non-tender; bowel sounds normal; no masses,  no organomegaly Extremities: extremities normal, atraumatic, no cyanosis or edema Neurologic: Alert and oriented X 3, normal strength and tone. Normal symmetric reflexes. Normal coordination and gait  ECOG PERFORMANCE STATUS: 1 - Symptomatic but completely ambulatory  Blood pressure 123/81, pulse (!) 107, temperature 97.6 F (36.4 C), temperature source Tympanic, resp. rate 16, weight 105 lb 14.4 oz (48 kg), SpO2 98 %.  LABORATORY DATA: Lab Results  Component Value Date   WBC 4.1 04/16/2022   HGB 10.0 (L) 04/17/2022   HCT 31.5 (  L) 04/17/2022   MCV 98.2 04/16/2022   PLT 193 04/16/2022      Chemistry      Component Value Date/Time   NA 137 04/17/2022 0158   K 4.1 04/17/2022 0158   CL 99 04/17/2022 0158   CO2 33 (H) 04/17/2022 0158   BUN 19 04/17/2022 0158   CREATININE <0.30 (L) 04/17/2022 0158   CREATININE 0.53 (L) 03/02/2022 1309      Component Value Date/Time   CALCIUM 8.6 (L) 04/17/2022 0158   ALKPHOS 69 03/27/2022 0500   AST 17 03/27/2022 0500   AST 12 (L) 03/02/2022 1309   ALT 20 03/27/2022 0500   ALT 13 03/02/2022 1309   BILITOT 0.2 (L) 03/27/2022 0500   BILITOT 0.4 03/02/2022 1309       RADIOGRAPHIC STUDIES: No results found.  ASSESSMENT AND PLAN: This is a very pleasant 66 years old white  male recently diagnosed with small cell lung cancer likely extensive stage (T1c, N1, M1a) disease diagnosed and February 2023 with the bilateral nodules in the right upper lobe as well as the left upper lobe but this could be also treated as limited stage disease because of the proximity of the lesions. The patient is currently undergoing a course of systemic chemotherapy with cisplatin 80 Mg/M2 on day 1 and etoposide 100 Mg/M2 on days 1, 2 and 3 every 3.  Status post 1 cycle.  The first cycle of his treatment well with no concerning adverse effect but unfortunately he was admitted 2 weeks later with respiratory failure and multifocal pneumonia as well as radiation pneumonitis.  The patient had prolonged hospitalization for treatment of his condition but he is finally at home after rehabilitation. Patient is feeling much better today. I discussed with the patient resuming his systemic treatment and recommended for him to consider resuming the chemotherapy with the same regimen cisplatin but I will reduce the dose to 60 Mg/M2 on day 1 and etoposide with reduced dose of 80 Mg/M2 on days 1, 2 and 3 every 3 weeks for the remaining 3 cycles. The patient is expected to start the first dose of this treatment next week. We will see him back for follow-up visit in 2 weeks for evaluation and management of any adverse effect of his treatment. The patient was advised to call immediately if he has any other concerning symptoms in the interval.   The patient voices understanding of current disease status and treatment options and is in agreement with the current care plan.  All questions were answered. The patient knows to call the clinic with any problems, questions or concerns. We can certainly see the patient much sooner if necessary.   Disclaimer: This note was dictated with voice recognition software. Similar sounding words can inadvertently be transcribed and may not be corrected upon review.

## 2022-05-27 ENCOUNTER — Telehealth: Payer: Self-pay | Admitting: Internal Medicine

## 2022-05-27 ENCOUNTER — Encounter: Payer: Self-pay | Admitting: Internal Medicine

## 2022-05-27 ENCOUNTER — Encounter: Payer: Self-pay | Admitting: *Deleted

## 2022-05-27 LAB — CMP (CANCER CENTER ONLY)
ALT: 12 U/L (ref 0–44)
AST: 14 U/L — ABNORMAL LOW (ref 15–41)
Albumin: 3.8 g/dL (ref 3.5–5.0)
Alkaline Phosphatase: 77 U/L (ref 38–126)
Anion gap: 5 (ref 5–15)
BUN: 19 mg/dL (ref 8–23)
CO2: 33 mmol/L — ABNORMAL HIGH (ref 22–32)
Calcium: 9.6 mg/dL (ref 8.9–10.3)
Chloride: 98 mmol/L (ref 98–111)
Creatinine: 0.39 mg/dL — ABNORMAL LOW (ref 0.61–1.24)
GFR, Estimated: >60 mL/min (ref >60)
Glucose, Bld: 88 mg/dL (ref 70–99)
Potassium: 4.2 mmol/L (ref 3.5–5.1)
Sodium: 136 mmol/L (ref 135–145)
Total Bilirubin: 0.3 mg/dL (ref 0.3–1.2)
Total Protein: 7.8 g/dL (ref 6.5–8.1)

## 2022-05-27 LAB — MAGNESIUM: Magnesium: 2.1 mg/dL (ref 1.7–2.4)

## 2022-05-27 NOTE — Telephone Encounter (Signed)
Scheduled per 05/30 los, patient has been called and voicemail was left.

## 2022-05-27 NOTE — Progress Notes (Signed)
Oncology Nurse Navigator Documentation     05/27/2022   12:00 PM 05/06/2022   10:00 AM 04/21/2022    9:00 AM 04/01/2022    3:00 PM 03/27/2022   11:00 AM 03/23/2022    4:00 PM 02/09/2022    4:00 PM  Oncology Nurse Navigator Flowsheets  Diagnosis Status       Confirmed Diagnosis Complete  Planned Course of Treatment      Chemo/Radiation Concurrent   Phase of Treatment Chemo     Radiation   Chemotherapy Actual Start Date:      02/18/2022   Radiation Actual Start Date:      02/17/2022   Radiation Expected End Date: 04/14/2022        Navigator Follow Up Date:  05/26/2022 04/27/2022 04/08/2022 04/02/2022 03/26/2022 02/12/2022  Navigator Follow Up Reason:  Follow-up Appointment Follow-up Appointment Other: Review Note Appointment Review Appointment Review  Navigation Complete Date: 05/27/2022        Post Navigation: Continue to Follow Patient? No        Reason Not Navigating Patient: Patient On Maintenance Chemotherapy        Navigator Location [facility]  Navigator Encounter Type Clinic/MDC Appt/Treatment Plan Review Appt/Treatment Plan Review Appt/Treatment Plan Review Appt/Treatment Plan Review Appt/Treatment Plan Review Clinic/MDC  Treatment Initiated Date 02/17/2022        Patient Visit Type MedOnc/I spoke to r. Zeman and his girlfriend yesterday. He is feeling must better post hospitalization.  He will continue his systemic therapy and appts are scheduled at this time.       MedOnc  Treatment Phase Treatment     Treatment Pre-Tx/Tx Discussion  Barriers/Navigation Needs  Coordination of Care Coordination of Care Coordination of Care Coordination of Care Coordination of Care Education  Education       Newly Diagnosed Cancer Education;Other  Interventions Psycho-Social Support Coordination of Care Coordination of Care Coordination of Care Coordination of Care Coordination of Care Coordination of  Care;Psycho-Social Support  Acuity Level 2-Minimal Needs (1-2 Barriers Identified) Level 2-Minimal Needs (1-2 Barriers Identified) Level 2-Minimal Needs (1-2 Barriers Identified) Level 2-Minimal Needs (1-2 Barriers Identified) Level 2-Minimal Needs (1-2 Barriers Identified) Level 2-Minimal Needs (1-2 Barriers Identified) Level 3-Moderate Needs (3-4 Barriers Identified)  Coordination of Care  Other Other Other Appts    Education Method       Verbal;Other  Time Spent with Patient 30 30 30 30 30 30  30

## 2022-05-29 ENCOUNTER — Telehealth: Payer: Self-pay

## 2022-05-29 NOTE — Telephone Encounter (Signed)
Mark Yu with [facility] left a message requesting for pt to have a modified barium swallow test as pt wants his PEG tube removed.  I have reviewed pts hospital records and found that the tube was place while he was inpatient (pt needed a trachea tube).  I spoke with pt to determine if he has a GI provider and he states he does not and has never had a colonoscopy.   Pt understands we will discuss his next steps with Dr. Julien Nordmann.

## 2022-06-01 ENCOUNTER — Other Ambulatory Visit: Payer: Self-pay

## 2022-06-01 ENCOUNTER — Inpatient Hospital Stay: Payer: Medicare Other | Attending: Internal Medicine

## 2022-06-01 ENCOUNTER — Inpatient Hospital Stay: Payer: Medicare Other

## 2022-06-01 ENCOUNTER — Ambulatory Visit
Admit: 2022-06-01 | Discharge: 2022-06-01 | Disposition: A | Discharge status: Home / Self Care | Payer: Medicare Other | Attending: Radiation Oncology | Admitting: Radiation Oncology

## 2022-06-01 VITALS — BP 139/90 | HR 94 | Temp 98.3°F | Resp 16 | Wt 109.0 lb

## 2022-06-01 DIAGNOSIS — R634 Abnormal weight loss: Secondary | ICD-10-CM | POA: Diagnosis not present

## 2022-06-01 DIAGNOSIS — Z7952 Long term (current) use of systemic steroids: Secondary | ICD-10-CM | POA: Insufficient documentation

## 2022-06-01 DIAGNOSIS — Z5111 Encounter for antineoplastic chemotherapy: Secondary | ICD-10-CM | POA: Diagnosis present

## 2022-06-01 DIAGNOSIS — C3412 Malignant neoplasm of upper lobe, left bronchus or lung: Secondary | ICD-10-CM

## 2022-06-01 DIAGNOSIS — F17210 Nicotine dependence, cigarettes, uncomplicated: Secondary | ICD-10-CM | POA: Insufficient documentation

## 2022-06-01 DIAGNOSIS — I10 Essential (primary) hypertension: Secondary | ICD-10-CM | POA: Diagnosis not present

## 2022-06-01 DIAGNOSIS — C3411 Malignant neoplasm of upper lobe, right bronchus or lung: Secondary | ICD-10-CM | POA: Diagnosis present

## 2022-06-01 DIAGNOSIS — Z79899 Other long term (current) drug therapy: Secondary | ICD-10-CM | POA: Insufficient documentation

## 2022-06-01 DIAGNOSIS — Z95828 Presence of other vascular implants and grafts: Secondary | ICD-10-CM

## 2022-06-01 DIAGNOSIS — F419 Anxiety disorder, unspecified: Secondary | ICD-10-CM | POA: Insufficient documentation

## 2022-06-01 LAB — CBC WITH DIFFERENTIAL (CANCER CENTER ONLY)
Abs Immature Granulocytes: 0.07 10*3/uL (ref 0.00–0.07)
Basophils Absolute: 0 10*3/uL (ref 0.0–0.1)
Basophils Relative: 0 %
Eosinophils Absolute: 0 10*3/uL (ref 0.0–0.5)
Eosinophils Relative: 0 %
HCT: 34 % — ABNORMAL LOW (ref 39.0–52.0)
Hemoglobin: 10.9 g/dL — ABNORMAL LOW (ref 13.0–17.0)
Immature Granulocytes: 1 %
Lymphocytes Relative: 3 %
Lymphs Abs: 0.4 10*3/uL — ABNORMAL LOW (ref 0.7–4.0)
MCH: 30.4 pg (ref 26.0–34.0)
MCHC: 32.1 g/dL (ref 30.0–36.0)
MCV: 95 fL (ref 80.0–100.0)
Monocytes Absolute: 0.6 10*3/uL (ref 0.1–1.0)
Monocytes Relative: 6 %
Neutro Abs: 10.1 10*3/uL — ABNORMAL HIGH (ref 1.7–7.7)
Neutrophils Relative %: 90 %
Platelet Count: 273 10*3/uL (ref 150–400)
RBC: 3.58 MIL/uL — ABNORMAL LOW (ref 4.22–5.81)
RDW: 17.7 % — ABNORMAL HIGH (ref 11.5–15.5)
WBC Count: 11.1 10*3/uL — ABNORMAL HIGH (ref 4.0–10.5)
nRBC: 0 % (ref 0.0–0.2)

## 2022-06-01 LAB — CMP (CANCER CENTER ONLY)
ALT: 12 U/L (ref 0–44)
AST: 14 U/L — ABNORMAL LOW (ref 15–41)
Albumin: 3.9 g/dL (ref 3.5–5.0)
Alkaline Phosphatase: 75 U/L (ref 38–126)
Anion gap: 6 (ref 5–15)
BUN: 24 mg/dL — ABNORMAL HIGH (ref 8–23)
CO2: 30 mmol/L (ref 22–32)
Calcium: 9.7 mg/dL (ref 8.9–10.3)
Chloride: 100 mmol/L (ref 98–111)
Creatinine: 0.4 mg/dL — ABNORMAL LOW (ref 0.61–1.24)
GFR, Estimated: >60 mL/min (ref >60)
Glucose, Bld: 99 mg/dL (ref 70–99)
Potassium: 4.4 mmol/L (ref 3.5–5.1)
Sodium: 136 mmol/L (ref 135–145)
Total Bilirubin: 0.4 mg/dL (ref 0.3–1.2)
Total Protein: 7.5 g/dL (ref 6.5–8.1)

## 2022-06-01 LAB — MAGNESIUM: Magnesium: 1.9 mg/dL (ref 1.7–2.4)

## 2022-06-01 MED ORDER — SODIUM CHLORIDE 0.9 % IV SOLN
10.0000 mg | Freq: Once | INTRAVENOUS | Status: AC
  Administered 2022-06-01: 10 mg via INTRAVENOUS
  Filled 2022-06-01: qty 10

## 2022-06-01 MED ORDER — MAGNESIUM SULFATE 2 GM/50ML IV SOLN
2.0000 g | Freq: Once | INTRAVENOUS | Status: AC
  Administered 2022-06-01: 2 g via INTRAVENOUS

## 2022-06-01 MED ORDER — HEPARIN SOD (PORK) LOCK FLUSH 100 UNIT/ML IV SOLN
500.0000 [IU] | Freq: Once | INTRAVENOUS | Status: AC | PRN
  Administered 2022-06-01: 500 [IU]

## 2022-06-01 MED ORDER — SODIUM CHLORIDE 0.9 % IV SOLN: Freq: Once | INTRAVENOUS | Status: AC

## 2022-06-01 MED ORDER — POTASSIUM CHLORIDE IN NACL 20-0.9 MEQ/L-% IV SOLN
Freq: Once | INTRAVENOUS | Status: AC
  Filled 2022-06-01: qty 1000

## 2022-06-01 MED ORDER — SODIUM CHLORIDE 0.9 % IV SOLN
80.0000 mg/m2 | Freq: Once | INTRAVENOUS | Status: AC
  Administered 2022-06-01: 130 mg via INTRAVENOUS
  Filled 2022-06-01: qty 6.5

## 2022-06-01 MED ORDER — PALONOSETRON HCL INJECTION 0.25 MG/5ML
INTRAVENOUS | Status: AC
  Filled 2022-06-01: qty 5

## 2022-06-01 MED ORDER — SODIUM CHLORIDE 0.9 % IV SOLN
150.0000 mg | Freq: Once | INTRAVENOUS | Status: AC
  Administered 2022-06-01: 150 mg via INTRAVENOUS
  Filled 2022-06-01: qty 150

## 2022-06-01 MED ORDER — SODIUM CHLORIDE 0.9 % IV SOLN
63.0000 mg/m2 | Freq: Once | INTRAVENOUS | Status: AC
  Administered 2022-06-01: 100 mg via INTRAVENOUS
  Filled 2022-06-01: qty 100

## 2022-06-01 MED ORDER — PALONOSETRON HCL INJECTION 0.25 MG/5ML
0.2500 mg | Freq: Once | INTRAVENOUS | Status: AC
  Administered 2022-06-01: 0.25 mg via INTRAVENOUS

## 2022-06-01 MED ORDER — SODIUM CHLORIDE 0.9% FLUSH
10.0000 mL | INTRAVENOUS | Status: DC | PRN
  Administered 2022-06-01: 10 mL

## 2022-06-01 MED ORDER — SODIUM CHLORIDE 0.9% FLUSH
10.0000 mL | Freq: Once | INTRAVENOUS | Status: AC
  Administered 2022-06-01: 10 mL

## 2022-06-01 NOTE — Progress Notes (Signed)
Per Cassie PA, OK to proceed with prehydration fluids prior to labs resulting today

## 2022-06-01 NOTE — Progress Notes (Signed)
  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 He finished radiation while he was inpatient for this. He's doing tremendously well compared to how ill he had previously been.  Impression/Plan: 1. 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 The patient has been doing well since completion of radiotherapy. We discussed that we would be happy to continue to follow him as needed, but he will also continue to follow up with Dr. Julien Nordmann in medical oncology and with pulmonary medicine.     Carola Rhine, PAC

## 2022-06-01 NOTE — Patient Instructions (Signed)
[facility] ONCOLOGY  Discharge Instructions: Thank you for choosing [facility] to provide your oncology and hematology care.   If you have a lab appointment with the [facility], please go directly to the [facility] and check in at the registration area.   Wear comfortable clothing and clothing appropriate for easy access to any Portacath or PICC line.   We strive to give you quality time with your provider. You may need to reschedule your appointment if you arrive late (15 or more minutes).  Arriving late affects you and other patients whose appointments are after yours.  Also, if you miss three or more appointments without notifying the office, you may be dismissed from the clinic at the provider's discretion.      For prescription refill requests, have your pharmacy contact our office and allow 72 hours for refills to be completed.    Today you received the following chemotherapy and/or immunotherapy agents: Cisplatin & Etoposide      To help prevent nausea and vomiting after your treatment, we encourage you to take your nausea medication as directed.  BELOW ARE SYMPTOMS THAT SHOULD BE REPORTED IMMEDIATELY: *FEVER GREATER THAN 100.4 F (38 C) OR HIGHER *CHILLS OR SWEATING *NAUSEA AND VOMITING THAT IS NOT CONTROLLED WITH YOUR NAUSEA MEDICATION *UNUSUAL SHORTNESS OF BREATH *UNUSUAL BRUISING OR BLEEDING *URINARY PROBLEMS (pain or burning when urinating, or frequent urination) *BOWEL PROBLEMS (unusual diarrhea, constipation, pain near the anus) TENDERNESS IN MOUTH AND THROAT WITH OR WITHOUT PRESENCE OF ULCERS (sore throat, sores in mouth, or a toothache) UNUSUAL RASH, SWELLING OR PAIN  UNUSUAL VAGINAL DISCHARGE OR ITCHING   Items with * indicate a potential emergency and should be followed up as soon as possible or go to the Emergency Department if any problems should occur.  Please show the CHEMOTHERAPY ALERT CARD or IMMUNOTHERAPY ALERT CARD at  check-in to the Emergency Department and triage nurse.  Should you have questions after your visit or need to cancel or reschedule your appointment, please contact [facility]  Dept: [phone]  and follow the prompts.  Office hours are 8:00 a.m. to 4:30 p.m. Monday - Friday. Please note that voicemails left after 4:00 p.m. may not be returned until the following business day.  We are closed weekends and major holidays. You have access to a nurse at all times for urgent questions. Please call the main number to the clinic Dept: [phone] and follow the prompts.   For any non-urgent questions, you may also contact your provider using MyChart. We now offer e-Visits for anyone 34 and older to request care online for non-urgent symptoms. For details visit [email or web address].   Also download the MyChart app! Go to the app store, search "MyChart", open the app, select [facility], and log in with your MyChart username and password.  Due to Covid, a mask is required upon entering the hospital/clinic. If you do not have a mask, one will be given to you upon arrival. For doctor visits, patients may have 1 support person aged 43 or older with them. For treatment visits, patients cannot have anyone with them due to current Covid guidelines and our immunocompromised population.

## 2022-06-02 ENCOUNTER — Ambulatory Visit (HOSPITAL_COMMUNITY)
Admission: RE | Admit: 2022-06-02 | Discharge: 2022-06-02 | Disposition: A | Discharge status: Home / Self Care | Payer: Medicare Other | Source: Ambulatory Visit | Attending: Internal Medicine | Admitting: Internal Medicine

## 2022-06-02 ENCOUNTER — Inpatient Hospital Stay: Payer: Medicare Other

## 2022-06-02 VITALS — BP 143/80 | HR 96 | Temp 98.1°F | Resp 16

## 2022-06-02 DIAGNOSIS — C3490 Malignant neoplasm of unspecified part of unspecified bronchus or lung: Secondary | ICD-10-CM | POA: Insufficient documentation

## 2022-06-02 DIAGNOSIS — C3412 Malignant neoplasm of upper lobe, left bronchus or lung: Secondary | ICD-10-CM

## 2022-06-02 MED ORDER — SODIUM CHLORIDE 0.9 % IV SOLN
80.0000 mg/m2 | Freq: Once | INTRAVENOUS | Status: AC
  Administered 2022-06-02: 130 mg via INTRAVENOUS
  Filled 2022-06-02: qty 6.5

## 2022-06-02 MED ORDER — SODIUM CHLORIDE 0.9 % IV SOLN: Freq: Once | INTRAVENOUS | Status: AC

## 2022-06-02 MED ORDER — IOHEXOL 300 MG/ML  SOLN
75.0000 mL | Freq: Once | INTRAMUSCULAR | Status: AC | PRN
  Administered 2022-06-02: 75 mL via INTRAVENOUS

## 2022-06-02 MED ORDER — SODIUM CHLORIDE (PF) 0.9 % IJ SOLN
INTRAMUSCULAR | Status: AC
  Filled 2022-06-02: qty 50

## 2022-06-02 MED ORDER — HEPARIN SOD (PORK) LOCK FLUSH 100 UNIT/ML IV SOLN
500.0000 [IU] | Freq: Once | INTRAVENOUS | Status: AC
  Administered 2022-06-02: 500 [IU] via INTRAVENOUS

## 2022-06-02 MED ORDER — SODIUM CHLORIDE 0.9 % IV SOLN
10.0000 mg | Freq: Once | INTRAVENOUS | Status: AC
  Administered 2022-06-02: 10 mg via INTRAVENOUS
  Filled 2022-06-02: qty 10

## 2022-06-02 MED ORDER — HEPARIN SOD (PORK) LOCK FLUSH 100 UNIT/ML IV SOLN: 500.0000 [IU] | Freq: Once | INTRAVENOUS | Status: DC | PRN

## 2022-06-02 MED ORDER — SODIUM CHLORIDE 0.9% FLUSH
10.0000 mL | INTRAVENOUS | Status: DC | PRN
  Administered 2022-06-02: 10 mL

## 2022-06-02 MED ORDER — HEPARIN SOD (PORK) LOCK FLUSH 100 UNIT/ML IV SOLN
INTRAVENOUS | Status: AC
  Filled 2022-06-02: qty 5

## 2022-06-02 MED FILL — Dexamethasone Sodium Phosphate Inj 100 MG/10ML: INTRAMUSCULAR | Qty: 1 | Status: AC

## 2022-06-03 ENCOUNTER — Other Ambulatory Visit: Payer: Self-pay

## 2022-06-03 ENCOUNTER — Telehealth: Payer: Self-pay | Admitting: Emergency Medicine

## 2022-06-03 ENCOUNTER — Inpatient Hospital Stay: Payer: Medicare Other

## 2022-06-03 VITALS — BP 141/89 | HR 96 | Temp 97.6°F | Resp 18

## 2022-06-03 DIAGNOSIS — C3412 Malignant neoplasm of upper lobe, left bronchus or lung: Secondary | ICD-10-CM

## 2022-06-03 DIAGNOSIS — Z5111 Encounter for antineoplastic chemotherapy: Secondary | ICD-10-CM | POA: Diagnosis not present

## 2022-06-03 MED ORDER — SODIUM CHLORIDE 0.9 % IV SOLN: Freq: Once | INTRAVENOUS | Status: AC

## 2022-06-03 MED ORDER — HEPARIN SOD (PORK) LOCK FLUSH 100 UNIT/ML IV SOLN
500.0000 [IU] | Freq: Once | INTRAVENOUS | Status: AC | PRN
  Administered 2022-06-03: 500 [IU]

## 2022-06-03 MED ORDER — SODIUM CHLORIDE 0.9% FLUSH
10.0000 mL | INTRAVENOUS | Status: DC | PRN
  Administered 2022-06-03: 10 mL

## 2022-06-03 MED ORDER — SODIUM CHLORIDE 0.9 % IV SOLN
10.0000 mg | Freq: Once | INTRAVENOUS | Status: AC
  Administered 2022-06-03: 10 mg via INTRAVENOUS
  Filled 2022-06-03: qty 10

## 2022-06-03 MED ORDER — SODIUM CHLORIDE 0.9 % IV SOLN
80.0000 mg/m2 | Freq: Once | INTRAVENOUS | Status: AC
  Administered 2022-06-03: 130 mg via INTRAVENOUS
  Filled 2022-06-03: qty 6.5

## 2022-06-03 NOTE — Patient Instructions (Signed)
[facility] ONCOLOGY  Discharge Instructions: Thank you for choosing [facility] to provide your oncology and hematology care.   If you have a lab appointment with the [facility], please go directly to the [facility] and check in at the registration area.   Wear comfortable clothing and clothing appropriate for easy access to any Portacath or PICC line.   We strive to give you quality time with your provider. You may need to reschedule your appointment if you arrive late (15 or more minutes).  Arriving late affects you and other patients whose appointments are after yours.  Also, if you miss three or more appointments without notifying the office, you may be dismissed from the clinic at the provider's discretion.      For prescription refill requests, have your pharmacy contact our office and allow 72 hours for refills to be completed.    Today you received the following chemotherapy and/or immunotherapy agents: Etoposide.       To help prevent nausea and vomiting after your treatment, we encourage you to take your nausea medication as directed.  BELOW ARE SYMPTOMS THAT SHOULD BE REPORTED IMMEDIATELY: *FEVER GREATER THAN 100.4 F (38 C) OR HIGHER *CHILLS OR SWEATING *NAUSEA AND VOMITING THAT IS NOT CONTROLLED WITH YOUR NAUSEA MEDICATION *UNUSUAL SHORTNESS OF BREATH *UNUSUAL BRUISING OR BLEEDING *URINARY PROBLEMS (pain or burning when urinating, or frequent urination) *BOWEL PROBLEMS (unusual diarrhea, constipation, pain near the anus) TENDERNESS IN MOUTH AND THROAT WITH OR WITHOUT PRESENCE OF ULCERS (sore throat, sores in mouth, or a toothache) UNUSUAL RASH, SWELLING OR PAIN  UNUSUAL VAGINAL DISCHARGE OR ITCHING   Items with * indicate a potential emergency and should be followed up as soon as possible or go to the Emergency Department if any problems should occur.  Please show the CHEMOTHERAPY ALERT CARD or IMMUNOTHERAPY ALERT CARD at check-in  to the Emergency Department and triage nurse.  Should you have questions after your visit or need to cancel or reschedule your appointment, please contact [facility]  Dept: [phone]  and follow the prompts.  Office hours are 8:00 a.m. to 4:30 p.m. Monday - Friday. Please note that voicemails left after 4:00 p.m. may not be returned until the following business day.  We are closed weekends and major holidays. You have access to a nurse at all times for urgent questions. Please call the main number to the clinic Dept: [phone] and follow the prompts.   For any non-urgent questions, you may also contact your provider using MyChart. We now offer e-Visits for anyone 46 and older to request care online for non-urgent symptoms. For details visit [email or web address].   Also download the MyChart app! Go to the app store, search "MyChart", open the app, select [facility], and log in with your MyChart username and password.  Due to Covid, a mask is required upon entering the hospital/clinic. If you do not have a mask, one will be given to you upon arrival. For doctor visits, patients may have 1 support person aged 3 or older with them. For treatment visits, patients cannot have anyone with them due to current Covid guidelines and our immunocompromised population.

## 2022-06-04 MED ORDER — PREDNISONE 10 MG PO TABS: 20.0000 mg | ORAL_TABLET | Freq: Every day | ORAL | 0 refills | Status: DC

## 2022-06-04 NOTE — Telephone Encounter (Addendum)
I called the patient and he reports that he is doing better with 10 mg of prednisone daily in the morning and 10 mg of prednisone the evening.  Please advise if ok to send the increase dose of prednisone?

## 2022-06-04 NOTE — Telephone Encounter (Signed)
Called patient and verified pharmacy with patient. Gave him a 90 day supply since Dr Lamonte Sakai wants to see him in 3 months. Nothing further needed

## 2022-06-04 NOTE — Telephone Encounter (Signed)
Yes he can increase to 20mg  daily. We will have to work on weaning it going forward as he gets stronger

## 2022-06-10 ENCOUNTER — Ambulatory Visit: Payer: Medicare Other | Admitting: Physician Assistant

## 2022-06-10 ENCOUNTER — Other Ambulatory Visit: Payer: Medicare Other

## 2022-06-10 NOTE — Progress Notes (Signed)
[facility] OFFICE PROGRESS NOTE  [name], NP [place] [address]  DIAGNOSIS Limited stage (T1c, N1, M1a) small cell lung cancer presented with left upper lobe lung nodule as well as left hilar lymphadenopathy as well as right upper lobe lung nodule diagnosed in February 2023  PRIOR THERAPY: None  CURRENT THERAPY: Systemic chemotherapy with cisplatin 80 Mg/M2 on day 1 and  etoposide 100 Mg/M2 on days 1, 2 and 3 concurrent with radiotherapy.  First dose February 17, 2022. He had a break in treatment from March-June due to prolonged hospitalization. He resumed cycle #2 on 06/01/22. Status post 2 cycles.  The dose of cisplatin was reduced to 60 mg per metered squared starting from cycle #2 as well as etoposide to 80 mg per metered squared.   INTERVAL HISTORY: Mark Yu 66 y.o. male returns to the clinic today for a follow-up visit. The patient was diagnosed with limited stage small cell lung cancer in February 2023.  The patient started treatment in March 2023 but then had a prolonged hospitalization secondary to respiratory failure. He has PT/OT/speech therapy/nursing assistance through home health. He is very motivated. He then followed up with Dr. Julien Yu on 05/26/2022 to discuss resuming treatment.  Dr. Julien Yu recommended reducing the dose of cisplatin and etoposide starting from cycle #2.  The patient then underwent cycle #2 on 06/01/22 and tolerated it very well without any concerning adverse side effects. He denies any recent fever, chills, or night sweats. The patient has a PEG tube which was placed in the hospital. He is looking forward to when he can have this taken out. He has an appetite and wants to eat. He reports that a request was placed today for a swallow study to assess his swallowing to ensure it is safe to start taking food p.o. A member of the nutritionist team is scheduled to see him at his next infusion. Regarding his breathing, he  reports that he tires with certain activities and may need to rest. He has a mild cough.  He has dyspnea on minor exertion which is about the same.  Denies any hemoptysis or chest pain. He denies any nausea or vomiting. He comments that he has not needed to take his anti-emetic at all.  Because of the patient's prolonged hospitalization, Dr. Julien Yu arrange for repeat CT scan of the chest to restage his condition.  Of note, the patient completed his course of radiation during his hospitalization.  The patient is here today for evaluation and for a 1-week follow-up visit to manage any adverse side effects of treatment.     MEDICAL HISTORY: Past Medical History:  Diagnosis Date   Anxiety    tx xanax   COPD (chronic obstructive pulmonary disease) ([place])    tx inhalers   Depression    Dyspnea    with exertion   HTN (hypertension)    tx amlodipine   Hyponatremia 03/08/2022    ALLERGIES:  is allergic to amoxicillin.  MEDICATIONS:  Current Outpatient Medications  Medication Sig Dispense Refill   albuterol (PROVENTIL) (2.5 MG/3ML) 0.083% nebulizer solution Take 2.5 mg by nebulization every 6 (six) hours as needed for wheezing or shortness of breath.     albuterol (VENTOLIN HFA) 108 (90 Base) MCG/ACT inhaler Inhale 2 puffs into the lungs every 4 (four) hours as needed for wheezing or shortness of breath.     ALPRAZolam (XANAX) 1 MG tablet TAKE 1/2 TO 1 TABLET BY MOUTH TWO TIMES A  DAY AS NEEDED FOR ANXIETY 30 tablet 0   amLODipine (NORVASC) 5 MG tablet Take 5 mg by mouth daily.     BREZTRI AEROSPHERE 160-9-4.8 MCG/ACT AERO Inhale 2 puffs into the lungs in the morning and at bedtime.     dextromethorphan-guaiFENesin (ROBITUSSIN-DM) 10-100 MG/5ML liquid Take 10 mLs by mouth 2 (two) times daily.     fluticasone (FLONASE) 50 MCG/ACT nasal spray Place 2 sprays into both nostrils daily as needed for allergies or rhinitis.     Nutritional Supplements (FEEDING SUPPLEMENT, OSMOLITE 1.5 CAL,) LIQD Place  1,000 mLs into feeding tube continuous.  0   pantoprazole sodium (PROTONIX) 40 mg Place 40 mg into feeding tube daily. 30 packet 3   predniSONE (DELTASONE) 10 MG tablet Take 2 tablets (20 mg total) by mouth daily with breakfast. 180 tablet 0   prochlorperazine (COMPAZINE) 10 MG tablet Take 1 tablet (10 mg total) by mouth every 6 (six) hours as needed for nausea or vomiting. (Patient taking differently: Take 10 mg by mouth every 6 (six) hours as needed for nausea or vomiting. PRN) 30 tablet 0   ALPRAZolam (XANAX) 0.5 MG tablet Take 0.5 mg by mouth 2 (two) times daily as needed. (Patient not taking: Reported on 06/11/2022)     Calcium Carb-Cholecalciferol 500-10 MG-MCG TABS Take 1 tablet by mouth daily. (Patient not taking: Reported on 06/11/2022)     cholecalciferol (VITAMIN D) 25 MCG (1000 UNIT) tablet Take 1,000 Units by mouth daily. (Patient not taking: Reported on 06/11/2022)     guaiFENesin (MUCINEX) 600 MG 12 hr tablet Take 600 mg by mouth every evening.     lidocaine-prilocaine (EMLA) cream Apply to the Port-A-Cath site 30-60 minutes before chemotherapy (Patient not taking: Reported on 06/11/2022) 30 g 0   Nutritional Supplements (FEEDING SUPPLEMENT, PROSOURCE TF,) liquid Place 45 mLs into feeding tube 3 (three) times daily. (Patient not taking: Reported on 06/11/2022) 30 mL 3   pantoprazole (PROTONIX) 40 MG tablet Take 40 mg by mouth daily. (Patient not taking: Reported on 06/11/2022)     Water For Irrigation, Sterile (FREE WATER) SOLN Place 150 mLs into feeding tube 6 (six) times daily. (Patient not taking: Reported on 06/11/2022) 30 mL 3   No current facility-administered medications for this visit.    SURGICAL HISTORY:  Past Surgical History:  Procedure Laterality Date   BRONCHIAL BIOPSY  02/02/2022   Procedure: BRONCHIAL BIOPSIES;  Surgeon: Collene Gobble, MD;  Location: [facility] ENDOSCOPY;  Service: Pulmonary;;   BRONCHIAL BRUSHINGS  02/02/2022   Procedure: BRONCHIAL BRUSHINGS;  Surgeon: Collene Gobble, MD;  Location: [facility] ENDOSCOPY;  Service: Pulmonary;;   BRONCHIAL NEEDLE ASPIRATION BIOPSY  02/02/2022   Procedure: BRONCHIAL NEEDLE ASPIRATION BIOPSIES;  Surgeon: Collene Gobble, MD;  Location: [facility] ENDOSCOPY;  Service: Pulmonary;;   IR GASTROSTOMY TUBE MOD SED  04/03/2022   IR IMAGING GUIDED PORT INSERTION  02/24/2022   MRI     NO PAST SURGERIES     VIDEO BRONCHOSCOPY WITH ENDOBRONCHIAL ULTRASOUND N/A 02/02/2022   Procedure: VIDEO BRONCHOSCOPY WITH ENDOBRONCHIAL ULTRASOUND;  Surgeon: Collene Gobble, MD;  Location: [facility] ENDOSCOPY;  Service: Pulmonary;  Laterality: N/A;   VIDEO BRONCHOSCOPY WITH RADIAL ENDOBRONCHIAL ULTRASOUND  02/02/2022   Procedure: VIDEO BRONCHOSCOPY WITH RADIAL ENDOBRONCHIAL ULTRASOUND;  Surgeon: Collene Gobble, MD;  Location: [facility] ENDOSCOPY;  Service: Pulmonary;;    REVIEW OF SYSTEMS:   Review of Systems  Constitutional: Positive for weight loss.  Negative for appetite change, chills, fatigue, and fever.  HENT: Positive for aspiration concerns with swallowing p.o.  Negative for mouth sores, nosebleeds, and sore throat.  Eyes: Negative for eye problems and icterus.  Respiratory: Positive for dyspnea on exertion/decreased exercise tolerance.  Negative hemoptysis and wheezing.   Cardiovascular: Negative for chest pain and leg swelling.  Gastrointestinal: Negative for abdominal pain, constipation, diarrhea, nausea and vomiting.  Genitourinary: Negative for bladder incontinence, difficulty urinating, dysuria, frequency and hematuria.   Musculoskeletal: Negative for back pain, gait problem, neck pain and neck stiffness.  Skin: Negative for itching and rash.  Neurological: Negative for dizziness, extremity weakness, gait problem, headaches, light-headedness and seizures.  Hematological: Negative for adenopathy. Does not bruise/bleed easily.  Psychiatric/Behavioral: Negative for confusion, depression and sleep disturbance. The patient is not nervous/anxious.     PHYSICAL  EXAMINATION:  Blood pressure (!) 121/94, pulse (!) 104, temperature 97.6 F (36.4 C), resp. rate 18, SpO2 97 %.  ECOG PERFORMANCE STATUS: 2  Physical Exam  Constitutional: Oriented to person, place, and time and thin appearing male. No distress.  HENT:  Head: Normocephalic and atraumatic.  Mouth/Throat: Oropharynx is clear and moist. No oropharyngeal exudate.  Eyes: Conjunctivae are normal. Right eye exhibits no discharge. Left eye exhibits no discharge. No scleral icterus.  Neck: Normal range of motion. Neck supple.  Cardiovascular: Normal rate, regular rhythm, normal heart sounds and intact distal pulses.   Pulmonary/Chest: Effort normal.  Quiet breath sounds in all lung fields. No respiratory distress. No wheezes. No rales.  Abdominal: Soft. Bowel sounds are normal. Exhibits no distension and no mass. There is no tenderness.  PEG tube placed.  Musculoskeletal: Normal range of motion. Exhibits no edema.  Lymphadenopathy:    No cervical adenopathy.  Neurological: Alert and oriented to person, place, and time. Exhibits muscle wasting.  The patient was examined in the wheelchair.  Skin: Skin is warm and dry. No rash noted. Not diaphoretic. No erythema. No pallor.  Psychiatric: Mood, memory and judgment normal.  Vitals reviewed.  LABORATORY DATA: Lab Results  Component Value Date   WBC 4.0 06/11/2022   HGB 9.9 (L) 06/11/2022   HCT 30.1 (L) 06/11/2022   MCV 92.3 06/11/2022   PLT 144 (L) 06/11/2022      Chemistry      Component Value Date/Time   NA 134 (L) 06/11/2022 1356   K 4.5 06/11/2022 1356   CL 98 06/11/2022 1356   CO2 30 06/11/2022 1356   BUN 15 06/11/2022 1356   CREATININE 0.43 (L) 06/11/2022 1356      Component Value Date/Time   CALCIUM 9.5 06/11/2022 1356   ALKPHOS 76 06/11/2022 1356   AST 12 (L) 06/11/2022 1356   ALT 10 06/11/2022 1356   BILITOT 0.3 06/11/2022 1356       RADIOGRAPHIC STUDIES:  CT Chest W Contrast  Result Date: 06/04/2022 CLINICAL DATA:   66 year old male with history of small cell lung cancer. Staging examination. EXAM: CT CHEST WITH CONTRAST TECHNIQUE: Multidetector CT imaging of the chest was performed during intravenous contrast administration. RADIATION DOSE REDUCTION: This exam was performed according to the departmental dose-optimization program which includes automated exposure control, adjustment of the mA and/or kV according to patient size and/or use of iterative reconstruction technique. CONTRAST:  6mL OMNIPAQUE IOHEXOL 300 MG/ML  SOLN COMPARISON:  Chest CT 03/08/2022. FINDINGS: Cardiovascular: Heart size is normal. There is no significant pericardial fluid, thickening or pericardial calcification. There is aortic atherosclerosis, as well as atherosclerosis of the great vessels of the mediastinum and the coronary arteries, including  calcified atherosclerotic plaque in the left anterior descending, left circumflex and right coronary arteries. Right internal jugular single-lumen porta cath with tip terminating in the right atrium. Mediastinum/Nodes: No pathologically enlarged mediastinal or hilar lymph nodes. Esophagus is unremarkable in appearance. No axillary lymphadenopathy. Lungs/Pleura: New areas of architectural distortion and nodularity in the right upper lobe, with the dominant nodule measuring up to 2.2 x 2.7 cm (axial image 53 of series 5). Notably, this lesion encompasses the previously noted right upper lobe partially calcified lesion which was presumed to be a calcified granuloma (previously not hypermetabolic). There also some dependent areas of ground-glass attenuation, septal thickening, thickening of the peribronchovascular interstitium and regional areas of nodular architectural distortion in the posteromedial aspects of the lower lobes of the lungs bilaterally, favored to represent areas of developing scarring, potentially from aspiration or recurrent infection. No pleural effusions. Diffuse bronchial wall thickening  with severe centrilobular and paraseptal emphysema. Upper Abdomen: Percutaneous gastrostomy tube with retention balloon in the stomach partially imaged. Subcentimeter well-defined low-attenuation lesion in segment 4B of the liver (axial image 189 of series 2), too small to characterize, but similar to prior PET-CT (not hypermetabolic), statistically likely a benign cyst. Aortic atherosclerosis. Musculoskeletal: There are no aggressive appearing lytic or blastic lesions noted in the visualized portions of the skeleton. IMPRESSION: 1. Interval development of nodular areas of architectural distortion in the right upper lobe, favored to be of infectious or inflammatory etiology. Close attention on short-term interval follow-up studies is recommended. 2. No residual nodularity at the site of the original left upper lobe lesion. No mediastinal or hilar lymphadenopathy. 3. There is what appears to be progressive scarring in the lower lobes of the lungs bilaterally. Clinical correlation for signs and symptoms of recurrent aspiration is recommended. 4. Diffuse bronchial wall thickening with severe centrilobular and paraseptal emphysema; imaging findings suggestive of underlying COPD. 5. Aortic atherosclerosis, in addition to three-vessel coronary artery disease. Please note that although the presence of coronary artery calcium documents the presence of coronary artery disease, the severity of this disease and any potential stenosis cannot be assessed on this non-gated CT examination. Assessment for potential risk factor modification, dietary therapy or pharmacologic therapy may be warranted, if clinically indicated. Aortic Atherosclerosis (ICD10-I70.0) and Emphysema (ICD10-J43.9). Electronically Signed   By: Vinnie Langton M.D.   On: 06/04/2022 08:17     ASSESSMENT/PLAN:  This is a very pleasant 66 year old Caucasian male diagnosed with likely extensive stage small cell lung cancer (T1c, N1, M1 a) which was diagnosed  in February 2023 with bilateral nodules in the right upper lobe as well as left lower lobe this could also be treated as limited stage disease because the proximity of the lesions.  The patient is currently undergoing treatment with cisplatin on day 1 and etoposide on days 1, 2, and 3 IV every 3 weeks.  He is status post 2 cycles.  The patient was off treatment for several weeks after a prolonged hospitalization after cycle #1 for acute respiratory failure.  He resumed cycle #2 on 06/01/22 with reduced dose cisplatin 60 mg per metered squared and dose reduced etoposide 80 mg per metered squared.  He tolerated this well.  The patient had a restaging CT scan of the chest to restage his disease since he was off treatment for several weeks.  The patient was seen with Dr. Julien Yu today.  Dr. Julien Yu personally and independently reviewed the scan and discussed the results with the patient today.  The scan showed no evidence  of disease progression in the right upper lobe.  There is no residual nodularity at the site of the original left upper lobe lesion.  There is no mediastinal or hilar adenopathy.  Labs reviewed.  Recommend that he continue on the same treatment the same dose.  We will likely keep the doses the same for cycle 3 and 4 per Dr. Julien Yu.   We will see the patient back for follow-up visit in 2 weeks for evaluation and repeat blood work before undergoing cycle #3.  The patient will continue with home health, PT, OT, speech therapy, and nursing support.  He mentions that they are coming less frequently due to the patient's progress.  He seems very motivated and is driven by his desire to return to work.  The patient is looking forward to when it is safe to remove his PEG tube.  He is expected to see a member of the nutritionist team at his next appointment per chart review.  The patient reports that an order was placed today to reassess his swallow study.  The patient was advised to call  immediately if he has any concerning symptoms in the interval. The patient voices understanding of current disease status and treatment options and is in agreement with the current care plan. All questions were answered. The patient knows to call the clinic with any problems, questions or concerns. We can certainly see the patient much sooner if necessary   Orders Placed This Encounter  Procedures   CBC with Differential ([facility] Only)    Standing Status:   Standing    Number of Occurrences:   9    Standing Expiration Date:   06/12/2023   CMP ([facility] only)    Standing Status:   Standing    Number of Occurrences:   9    Standing Expiration Date:   06/12/2023   Sample to Blood Bank    Standing Status:   Standing    Number of Occurrences:   6    Standing Expiration Date:   06/12/2023      Mark Sos Edlyn Rosenburg, PA-C 06/11/22  ADDENDUM: Hematology/Oncology Attending: I had a face-to-face encounter with the patient today.  I reviewed his record, lab and recommended his care plan.  This is a very pleasant 66 years old white male diagnosed with extensive stage small cell lung cancer in February 2023 with bilateral nodules in the right upper lobe as well as the left lower lobe and treated as limited stage disease with a course of systemic chemotherapy with cisplatin and the etoposide status post 1 cycle concurrent with radiation.  The patient was admitted to the hospital and the intensive care unit for more than a month with significant pneumonia and respiratory failure. He completed his course of radiotherapy and the patient is feeling much better.  He resumed his treatment with reduced dose cisplatin under etoposide last week and tolerated the first week of this treatment fairly well. He had repeat CT scan of the chest performed recently.  I personally and independently reviewed the scans and discussed the result with the patient today. His scan showed significant improvement of  his disease and no concerning findings for progression at this point. I recommended for the patient to continue his current treatment with the same regimen and he will come back for follow-up visit in 2 weeks for evaluation before restarting cycle #3 of his treatment. The patient was advised to call immediately if he has any other concerning symptoms  in the interval.  Disclaimer: This note was dictated with voice recognition software. Similar sounding words can inadvertently be transcribed and may be missed upon review. Eilleen Kempf, MD

## 2022-06-11 ENCOUNTER — Inpatient Hospital Stay: Payer: Medicare Other

## 2022-06-11 ENCOUNTER — Other Ambulatory Visit: Payer: Self-pay

## 2022-06-11 ENCOUNTER — Inpatient Hospital Stay (HOSPITAL_BASED_OUTPATIENT_CLINIC_OR_DEPARTMENT_OTHER): Payer: Medicare Other | Admitting: Physician Assistant

## 2022-06-11 VITALS — BP 121/94 | HR 104 | Temp 97.6°F | Resp 18

## 2022-06-11 DIAGNOSIS — Z95828 Presence of other vascular implants and grafts: Secondary | ICD-10-CM

## 2022-06-11 DIAGNOSIS — C3412 Malignant neoplasm of upper lobe, left bronchus or lung: Secondary | ICD-10-CM

## 2022-06-11 DIAGNOSIS — Z5111 Encounter for antineoplastic chemotherapy: Secondary | ICD-10-CM | POA: Diagnosis not present

## 2022-06-11 LAB — CMP (CANCER CENTER ONLY)
ALT: 10 U/L (ref 0–44)
AST: 12 U/L — ABNORMAL LOW (ref 15–41)
Albumin: 4 g/dL (ref 3.5–5.0)
Alkaline Phosphatase: 76 U/L (ref 38–126)
Anion gap: 6 (ref 5–15)
BUN: 15 mg/dL (ref 8–23)
CO2: 30 mmol/L (ref 22–32)
Calcium: 9.5 mg/dL (ref 8.9–10.3)
Chloride: 98 mmol/L (ref 98–111)
Creatinine: 0.43 mg/dL — ABNORMAL LOW (ref 0.61–1.24)
GFR, Estimated: >60 mL/min (ref >60)
Glucose, Bld: 112 mg/dL — ABNORMAL HIGH (ref 70–99)
Potassium: 4.5 mmol/L (ref 3.5–5.1)
Sodium: 134 mmol/L — ABNORMAL LOW (ref 135–145)
Total Bilirubin: 0.3 mg/dL (ref 0.3–1.2)
Total Protein: 7.5 g/dL (ref 6.5–8.1)

## 2022-06-11 LAB — CBC WITH DIFFERENTIAL (CANCER CENTER ONLY)
Abs Immature Granulocytes: 0.01 10*3/uL (ref 0.00–0.07)
Basophils Absolute: 0 10*3/uL (ref 0.0–0.1)
Basophils Relative: 0 %
Eosinophils Absolute: 0 10*3/uL (ref 0.0–0.5)
Eosinophils Relative: 0 %
HCT: 30.1 % — ABNORMAL LOW (ref 39.0–52.0)
Hemoglobin: 9.9 g/dL — ABNORMAL LOW (ref 13.0–17.0)
Immature Granulocytes: 0 %
Lymphocytes Relative: 7 %
Lymphs Abs: 0.3 10*3/uL — ABNORMAL LOW (ref 0.7–4.0)
MCH: 30.4 pg (ref 26.0–34.0)
MCHC: 32.9 g/dL (ref 30.0–36.0)
MCV: 92.3 fL (ref 80.0–100.0)
Monocytes Absolute: 0.2 10*3/uL (ref 0.1–1.0)
Monocytes Relative: 6 %
Neutro Abs: 3.5 10*3/uL (ref 1.7–7.7)
Neutrophils Relative %: 87 %
Platelet Count: 144 10*3/uL — ABNORMAL LOW (ref 150–400)
RBC: 3.26 MIL/uL — ABNORMAL LOW (ref 4.22–5.81)
RDW: 16.8 % — ABNORMAL HIGH (ref 11.5–15.5)
WBC Count: 4 10*3/uL (ref 4.0–10.5)
nRBC: 0 % (ref 0.0–0.2)

## 2022-06-11 LAB — MAGNESIUM: Magnesium: 2 mg/dL (ref 1.7–2.4)

## 2022-06-11 MED ORDER — SODIUM CHLORIDE 0.9% FLUSH
10.0000 mL | Freq: Once | INTRAVENOUS | Status: AC
  Administered 2022-06-11: 10 mL

## 2022-06-11 MED ORDER — HEPARIN SOD (PORK) LOCK FLUSH 100 UNIT/ML IV SOLN
500.0000 [IU] | Freq: Once | INTRAVENOUS | Status: AC
  Administered 2022-06-11: 500 [IU]

## 2022-06-16 ENCOUNTER — Other Ambulatory Visit: Payer: Medicare Other

## 2022-06-17 ENCOUNTER — Other Ambulatory Visit: Payer: Self-pay

## 2022-06-17 ENCOUNTER — Inpatient Hospital Stay: Payer: Medicare Other

## 2022-06-17 DIAGNOSIS — Z5111 Encounter for antineoplastic chemotherapy: Secondary | ICD-10-CM | POA: Diagnosis not present

## 2022-06-17 DIAGNOSIS — C3412 Malignant neoplasm of upper lobe, left bronchus or lung: Secondary | ICD-10-CM

## 2022-06-17 DIAGNOSIS — Z95828 Presence of other vascular implants and grafts: Secondary | ICD-10-CM

## 2022-06-17 LAB — CMP (CANCER CENTER ONLY)
ALT: 11 U/L (ref 0–44)
AST: 14 U/L — ABNORMAL LOW (ref 15–41)
Albumin: 4 g/dL (ref 3.5–5.0)
Alkaline Phosphatase: 72 U/L (ref 38–126)
Anion gap: 5 (ref 5–15)
BUN: 18 mg/dL (ref 8–23)
CO2: 31 mmol/L (ref 22–32)
Calcium: 9.5 mg/dL (ref 8.9–10.3)
Chloride: 99 mmol/L (ref 98–111)
Creatinine: 0.44 mg/dL — ABNORMAL LOW (ref 0.61–1.24)
GFR, Estimated: >60 mL/min
Glucose, Bld: 130 mg/dL — ABNORMAL HIGH (ref 70–99)
Potassium: 4.4 mmol/L (ref 3.5–5.1)
Sodium: 135 mmol/L (ref 135–145)
Total Bilirubin: 0.4 mg/dL (ref 0.3–1.2)
Total Protein: 7.5 g/dL (ref 6.5–8.1)

## 2022-06-17 LAB — CBC WITH DIFFERENTIAL (CANCER CENTER ONLY)
Abs Immature Granulocytes: 0.01 10*3/uL (ref 0.00–0.07)
Basophils Absolute: 0 10*3/uL (ref 0.0–0.1)
Basophils Relative: 0 %
Eosinophils Absolute: 0 10*3/uL (ref 0.0–0.5)
Eosinophils Relative: 0 %
HCT: 30.1 % — ABNORMAL LOW (ref 39.0–52.0)
Hemoglobin: 9.7 g/dL — ABNORMAL LOW (ref 13.0–17.0)
Immature Granulocytes: 0 %
Lymphocytes Relative: 8 %
Lymphs Abs: 0.2 10*3/uL — ABNORMAL LOW (ref 0.7–4.0)
MCH: 30.3 pg (ref 26.0–34.0)
MCHC: 32.2 g/dL (ref 30.0–36.0)
MCV: 94.1 fL (ref 80.0–100.0)
Monocytes Absolute: 0.3 10*3/uL (ref 0.1–1.0)
Monocytes Relative: 11 %
Neutro Abs: 2 10*3/uL (ref 1.7–7.7)
Neutrophils Relative %: 81 %
Platelet Count: 199 10*3/uL (ref 150–400)
RBC: 3.2 MIL/uL — ABNORMAL LOW (ref 4.22–5.81)
RDW: 17.7 % — ABNORMAL HIGH (ref 11.5–15.5)
WBC Count: 2.4 10*3/uL — ABNORMAL LOW (ref 4.0–10.5)
nRBC: 0 % (ref 0.0–0.2)

## 2022-06-17 LAB — MAGNESIUM: Magnesium: 1.9 mg/dL (ref 1.7–2.4)

## 2022-06-17 LAB — SAMPLE TO BLOOD BANK

## 2022-06-17 MED ORDER — HEPARIN SOD (PORK) LOCK FLUSH 100 UNIT/ML IV SOLN
500.0000 [IU] | Freq: Once | INTRAVENOUS | Status: AC
  Administered 2022-06-17: 500 [IU]

## 2022-06-17 MED ORDER — SODIUM CHLORIDE 0.9% FLUSH
10.0000 mL | Freq: Once | INTRAVENOUS | Status: AC
  Administered 2022-06-17: 10 mL

## 2022-06-22 ENCOUNTER — Other Ambulatory Visit (HOSPITAL_COMMUNITY): Payer: Self-pay

## 2022-06-22 DIAGNOSIS — R1310 Dysphagia, unspecified: Secondary | ICD-10-CM

## 2022-06-23 ENCOUNTER — Inpatient Hospital Stay: Payer: Medicare Other

## 2022-06-23 ENCOUNTER — Encounter: Payer: Self-pay | Admitting: Physician Assistant

## 2022-06-23 ENCOUNTER — Inpatient Hospital Stay: Payer: Medicare Other | Admitting: Dietician

## 2022-06-23 ENCOUNTER — Inpatient Hospital Stay (HOSPITAL_BASED_OUTPATIENT_CLINIC_OR_DEPARTMENT_OTHER): Payer: Medicare Other | Admitting: Physician Assistant

## 2022-06-23 ENCOUNTER — Other Ambulatory Visit: Payer: Self-pay

## 2022-06-23 ENCOUNTER — Other Ambulatory Visit: Payer: Self-pay | Admitting: Internal Medicine

## 2022-06-23 VITALS — BP 122/91 | HR 86 | Temp 98.0°F | Resp 16 | Ht 71.0 in | Wt 111.0 lb

## 2022-06-23 DIAGNOSIS — Z95828 Presence of other vascular implants and grafts: Secondary | ICD-10-CM

## 2022-06-23 DIAGNOSIS — C3412 Malignant neoplasm of upper lobe, left bronchus or lung: Secondary | ICD-10-CM

## 2022-06-23 DIAGNOSIS — Z5111 Encounter for antineoplastic chemotherapy: Secondary | ICD-10-CM | POA: Diagnosis not present

## 2022-06-23 LAB — CBC WITH DIFFERENTIAL (CANCER CENTER ONLY)
Abs Immature Granulocytes: 0.19 10*3/uL — ABNORMAL HIGH (ref 0.00–0.07)
Basophils Absolute: 0 10*3/uL (ref 0.0–0.1)
Basophils Relative: 0 %
Eosinophils Absolute: 0 10*3/uL (ref 0.0–0.5)
Eosinophils Relative: 0 %
HCT: 32.1 % — ABNORMAL LOW (ref 39.0–52.0)
Hemoglobin: 10.4 g/dL — ABNORMAL LOW (ref 13.0–17.0)
Immature Granulocytes: 3 %
Lymphocytes Relative: 8 %
Lymphs Abs: 0.5 10*3/uL — ABNORMAL LOW (ref 0.7–4.0)
MCH: 31 pg (ref 26.0–34.0)
MCHC: 32.4 g/dL (ref 30.0–36.0)
MCV: 95.5 fL (ref 80.0–100.0)
Monocytes Absolute: 0.7 10*3/uL (ref 0.1–1.0)
Monocytes Relative: 10 %
Neutro Abs: 5.3 10*3/uL (ref 1.7–7.7)
Neutrophils Relative %: 79 %
Platelet Count: 283 10*3/uL (ref 150–400)
RBC: 3.36 MIL/uL — ABNORMAL LOW (ref 4.22–5.81)
RDW: 19.3 % — ABNORMAL HIGH (ref 11.5–15.5)
WBC Count: 6.7 10*3/uL (ref 4.0–10.5)
nRBC: 0 % (ref 0.0–0.2)

## 2022-06-23 LAB — CMP (CANCER CENTER ONLY)
ALT: 12 U/L (ref 0–44)
AST: 13 U/L — ABNORMAL LOW (ref 15–41)
Albumin: 3.9 g/dL (ref 3.5–5.0)
Alkaline Phosphatase: 71 U/L (ref 38–126)
Anion gap: 6 (ref 5–15)
BUN: 13 mg/dL (ref 8–23)
CO2: 31 mmol/L (ref 22–32)
Calcium: 9.4 mg/dL (ref 8.9–10.3)
Chloride: 100 mmol/L (ref 98–111)
Creatinine: 0.44 mg/dL — ABNORMAL LOW (ref 0.61–1.24)
GFR, Estimated: >60 mL/min (ref >60)
Glucose, Bld: 100 mg/dL — ABNORMAL HIGH (ref 70–99)
Potassium: 4.1 mmol/L (ref 3.5–5.1)
Sodium: 137 mmol/L (ref 135–145)
Total Bilirubin: 0.4 mg/dL (ref 0.3–1.2)
Total Protein: 7.1 g/dL (ref 6.5–8.1)

## 2022-06-23 LAB — SAMPLE TO BLOOD BANK

## 2022-06-23 LAB — MAGNESIUM: Magnesium: 2 mg/dL (ref 1.7–2.4)

## 2022-06-23 MED ORDER — SODIUM CHLORIDE 0.9 % IV SOLN
80.0000 mg/m2 | Freq: Once | INTRAVENOUS | Status: AC
  Administered 2022-06-23: 130 mg via INTRAVENOUS
  Filled 2022-06-23: qty 6.5

## 2022-06-23 MED ORDER — SODIUM CHLORIDE 0.9 % IV SOLN: Freq: Once | INTRAVENOUS | Status: AC

## 2022-06-23 MED ORDER — SODIUM CHLORIDE 0.9 % IV SOLN
10.0000 mg | Freq: Once | INTRAVENOUS | Status: AC
  Administered 2022-06-23: 10 mg via INTRAVENOUS
  Filled 2022-06-23: qty 10

## 2022-06-23 MED ORDER — SODIUM CHLORIDE 0.9 % IV SOLN
60.0000 mg/m2 | Freq: Once | INTRAVENOUS | Status: AC
  Administered 2022-06-23: 95 mg via INTRAVENOUS
  Filled 2022-06-23: qty 95

## 2022-06-23 MED ORDER — SODIUM CHLORIDE 0.9 % IV SOLN
150.0000 mg | Freq: Once | INTRAVENOUS | Status: AC
  Administered 2022-06-23: 150 mg via INTRAVENOUS
  Filled 2022-06-23: qty 150

## 2022-06-23 MED ORDER — MAGNESIUM SULFATE 2 GM/50ML IV SOLN
2.0000 g | Freq: Once | INTRAVENOUS | Status: AC
  Administered 2022-06-23: 2 g via INTRAVENOUS
  Filled 2022-06-23: qty 50

## 2022-06-23 MED ORDER — SODIUM CHLORIDE 0.9% FLUSH
10.0000 mL | INTRAVENOUS | Status: DC | PRN
  Administered 2022-06-23: 10 mL

## 2022-06-23 MED ORDER — HEPARIN SOD (PORK) LOCK FLUSH 100 UNIT/ML IV SOLN
500.0000 [IU] | Freq: Once | INTRAVENOUS | Status: AC | PRN
  Administered 2022-06-23: 500 [IU]

## 2022-06-23 MED ORDER — POTASSIUM CHLORIDE IN NACL 20-0.9 MEQ/L-% IV SOLN
Freq: Once | INTRAVENOUS | Status: AC
  Filled 2022-06-23: qty 1000

## 2022-06-23 MED ORDER — PALONOSETRON HCL INJECTION 0.25 MG/5ML
0.2500 mg | Freq: Once | INTRAVENOUS | Status: AC
  Administered 2022-06-23: 0.25 mg via INTRAVENOUS
  Filled 2022-06-23: qty 5

## 2022-06-23 MED ORDER — SODIUM CHLORIDE 0.9% FLUSH
10.0000 mL | Freq: Once | INTRAVENOUS | Status: AC
  Administered 2022-06-23: 10 mL

## 2022-06-24 ENCOUNTER — Inpatient Hospital Stay: Payer: Medicare Other

## 2022-06-24 VITALS — BP 116/78 | HR 99 | Temp 97.9°F | Resp 16

## 2022-06-24 DIAGNOSIS — C3412 Malignant neoplasm of upper lobe, left bronchus or lung: Secondary | ICD-10-CM

## 2022-06-24 DIAGNOSIS — Z5111 Encounter for antineoplastic chemotherapy: Secondary | ICD-10-CM | POA: Diagnosis not present

## 2022-06-24 MED ORDER — SODIUM CHLORIDE 0.9 % IV SOLN
10.0000 mg | Freq: Once | INTRAVENOUS | Status: AC
  Administered 2022-06-24: 10 mg via INTRAVENOUS
  Filled 2022-06-24: qty 10

## 2022-06-24 MED ORDER — SODIUM CHLORIDE 0.9 % IV SOLN: Freq: Once | INTRAVENOUS | Status: AC

## 2022-06-24 MED ORDER — HEPARIN SOD (PORK) LOCK FLUSH 100 UNIT/ML IV SOLN
500.0000 [IU] | Freq: Once | INTRAVENOUS | Status: AC | PRN
  Administered 2022-06-24: 500 [IU]

## 2022-06-24 MED ORDER — SODIUM CHLORIDE 0.9% FLUSH
10.0000 mL | INTRAVENOUS | Status: DC | PRN
  Administered 2022-06-24: 10 mL

## 2022-06-24 MED ORDER — SODIUM CHLORIDE 0.9 % IV SOLN
80.0000 mg/m2 | Freq: Once | INTRAVENOUS | Status: AC
  Administered 2022-06-24: 130 mg via INTRAVENOUS
  Filled 2022-06-24: qty 6.5

## 2022-06-24 MED FILL — Dexamethasone Sodium Phosphate Inj 100 MG/10ML: INTRAMUSCULAR | Qty: 1 | Status: AC

## 2022-06-24 NOTE — Patient Instructions (Signed)
[facility] ONCOL[facility]  Discharge Instructions: Thank you for choosing [facility] to provide your oncology and hematology care.   If you have a lab appointment with the [facility], please go directly to the [facility] and check in at the registration area.   Wear comfortable clothing and clothing appropriate for easy access to any Portacath or PICC line.   We strive to give you quality time with your provider. You may need to reschedule your appointment if you arrive late (15 or more minutes).  Arriving late affects you and other patients whose appointments are after yours.  Also, if you miss three or more appointments without notifying the office, you may be dismissed from the clinic at the provider's discretion.      For prescription refill requests, have your pharmacy contact our office and allow 72 hours for refills to be completed.    Today you received the following chemotherapy and/or immunotherapy agents: Etoposide.   To help prevent nausea and vomiting after your treatment, we encourage you to take your nausea medication as directed.  BELOW ARE SYMPTOMS THAT SHOULD BE REPORTED IMMEDIATELY: *FEVER GREATER THAN 100.4 F (38 C) OR HIGHER *CHILLS OR SWEATING *NAUSEA AND VOMITING THAT IS NOT CONTROLLED WITH YOUR NAUSEA MEDICATION *UNUSUAL SHORTNESS OF BREATH *UNUSUAL BRUISING OR BLEEDING *URINARY PROBLEMS (pain or burning when urinating, or frequent urination) *BOWEL PROBLEMS (unusual diarrhea, constipation, pain near the anus) TENDERNESS IN MOUTH AND THROAT WITH OR WITHOUT PRESENCE OF ULCERS (sore throat, sores in mouth, or a toothache) UNUSUAL RASH, SWELLING OR PAIN  UNUSUAL VAGINAL DISCHARGE OR ITCHING   Items with * indicate a potential emergency and should be followed up as soon as possible or go to the Emergency Department if any problems should occur.  Please show the CHEMOTHERAPY ALERT CARD or IMMUNOTHERAPY ALERT CARD at check-in to  the Emergency Department and triage nurse.  Should you have questions after your visit or need to cancel or reschedule your appointment, please contact [facility]  Dept: [phone]  and follow the prompts.  Office hours are 8:00 a.m. to 4:30 p.m. Monday - Friday. Please note that voicemails left after 4:00 p.m. may not be returned until the following business day.  We are closed weekends and major holidays. You have access to a nurse at all times for urgent questions. Please call the main number to the clinic Dept: [phone] and follow the prompts.   For any non-urgent questions, you may also contact your provider using MyChart. We now offer e-Visits for anyone 65 and older to request care online for non-urgent symptoms. For details visit [email or web address].   Also download the MyChart app! Go to the app store, search "MyChart", open the app, select [facility], and log in with your MyChart username and password.  Masks are optional in the cancer centers. If you would like for your care team to wear a mask while they are taking care of you, please let them know. For doctor visits, patients may have with them one support person who is at least 65 years old. At this time, visitors are not allowed in the infusion area.

## 2022-06-25 ENCOUNTER — Inpatient Hospital Stay: Payer: Medicare Other

## 2022-06-25 ENCOUNTER — Other Ambulatory Visit: Payer: Self-pay

## 2022-06-25 VITALS — BP 133/80 | HR 87 | Temp 97.9°F | Resp 17

## 2022-06-25 DIAGNOSIS — Z5111 Encounter for antineoplastic chemotherapy: Secondary | ICD-10-CM | POA: Diagnosis not present

## 2022-06-25 DIAGNOSIS — C3412 Malignant neoplasm of upper lobe, left bronchus or lung: Secondary | ICD-10-CM

## 2022-06-25 MED ORDER — SODIUM CHLORIDE 0.9 % IV SOLN: Freq: Once | INTRAVENOUS | Status: AC

## 2022-06-25 MED ORDER — HEPARIN SOD (PORK) LOCK FLUSH 100 UNIT/ML IV SOLN
500.0000 [IU] | Freq: Once | INTRAVENOUS | Status: AC | PRN
  Administered 2022-06-25: 500 [IU]

## 2022-06-25 MED ORDER — SODIUM CHLORIDE 0.9 % IV SOLN
10.0000 mg | Freq: Once | INTRAVENOUS | Status: AC
  Administered 2022-06-25: 10 mg via INTRAVENOUS
  Filled 2022-06-25: qty 10

## 2022-06-25 MED ORDER — SODIUM CHLORIDE 0.9% FLUSH
10.0000 mL | INTRAVENOUS | Status: DC | PRN
  Administered 2022-06-25: 10 mL

## 2022-06-25 MED ORDER — SODIUM CHLORIDE 0.9 % IV SOLN
80.0000 mg/m2 | Freq: Once | INTRAVENOUS | Status: AC
  Administered 2022-06-25: 130 mg via INTRAVENOUS
  Filled 2022-06-25: qty 6.5

## 2022-07-01 ENCOUNTER — Other Ambulatory Visit: Payer: Self-pay

## 2022-07-01 ENCOUNTER — Inpatient Hospital Stay: Payer: Medicare Other | Attending: Internal Medicine

## 2022-07-01 DIAGNOSIS — Z7952 Long term (current) use of systemic steroids: Secondary | ICD-10-CM | POA: Insufficient documentation

## 2022-07-01 DIAGNOSIS — Z95828 Presence of other vascular implants and grafts: Secondary | ICD-10-CM

## 2022-07-01 DIAGNOSIS — R059 Cough, unspecified: Secondary | ICD-10-CM | POA: Insufficient documentation

## 2022-07-01 DIAGNOSIS — C3412 Malignant neoplasm of upper lobe, left bronchus or lung: Secondary | ICD-10-CM | POA: Diagnosis present

## 2022-07-01 DIAGNOSIS — F17210 Nicotine dependence, cigarettes, uncomplicated: Secondary | ICD-10-CM | POA: Diagnosis not present

## 2022-07-01 DIAGNOSIS — I10 Essential (primary) hypertension: Secondary | ICD-10-CM | POA: Diagnosis not present

## 2022-07-01 DIAGNOSIS — R5383 Other fatigue: Secondary | ICD-10-CM | POA: Diagnosis not present

## 2022-07-01 DIAGNOSIS — R1310 Dysphagia, unspecified: Secondary | ICD-10-CM | POA: Insufficient documentation

## 2022-07-01 DIAGNOSIS — Z5111 Encounter for antineoplastic chemotherapy: Secondary | ICD-10-CM | POA: Diagnosis present

## 2022-07-01 DIAGNOSIS — R634 Abnormal weight loss: Secondary | ICD-10-CM | POA: Insufficient documentation

## 2022-07-01 DIAGNOSIS — Z79899 Other long term (current) drug therapy: Secondary | ICD-10-CM | POA: Insufficient documentation

## 2022-07-01 DIAGNOSIS — C3411 Malignant neoplasm of upper lobe, right bronchus or lung: Secondary | ICD-10-CM | POA: Diagnosis present

## 2022-07-01 LAB — CMP (CANCER CENTER ONLY)
ALT: 10 U/L (ref 0–44)
AST: 12 U/L — ABNORMAL LOW (ref 15–41)
Albumin: 3.9 g/dL (ref 3.5–5.0)
Alkaline Phosphatase: 62 U/L (ref 38–126)
Anion gap: 5 (ref 5–15)
BUN: 17 mg/dL (ref 8–23)
CO2: 30 mmol/L (ref 22–32)
Calcium: 9.1 mg/dL (ref 8.9–10.3)
Chloride: 98 mmol/L (ref 98–111)
Creatinine: 0.46 mg/dL — ABNORMAL LOW (ref 0.61–1.24)
GFR, Estimated: >60 mL/min (ref >60)
Glucose, Bld: 133 mg/dL — ABNORMAL HIGH (ref 70–99)
Potassium: 4.6 mmol/L (ref 3.5–5.1)
Sodium: 133 mmol/L — ABNORMAL LOW (ref 135–145)
Total Bilirubin: 0.3 mg/dL (ref 0.3–1.2)
Total Protein: 6.8 g/dL (ref 6.5–8.1)

## 2022-07-01 LAB — CBC WITH DIFFERENTIAL (CANCER CENTER ONLY)
Abs Immature Granulocytes: 0.1 10*3/uL — ABNORMAL HIGH (ref 0.00–0.07)
Basophils Absolute: 0 10*3/uL (ref 0.0–0.1)
Basophils Relative: 0 %
Eosinophils Absolute: 0 10*3/uL (ref 0.0–0.5)
Eosinophils Relative: 0 %
HCT: 26.7 % — ABNORMAL LOW (ref 39.0–52.0)
Hemoglobin: 8.8 g/dL — ABNORMAL LOW (ref 13.0–17.0)
Immature Granulocytes: 1 %
Lymphocytes Relative: 3 %
Lymphs Abs: 0.2 10*3/uL — ABNORMAL LOW (ref 0.7–4.0)
MCH: 31 pg (ref 26.0–34.0)
MCHC: 33 g/dL (ref 30.0–36.0)
MCV: 94 fL (ref 80.0–100.0)
Monocytes Absolute: 0.1 10*3/uL (ref 0.1–1.0)
Monocytes Relative: 1 %
Neutro Abs: 6.8 10*3/uL (ref 1.7–7.7)
Neutrophils Relative %: 95 %
Platelet Count: 132 10*3/uL — ABNORMAL LOW (ref 150–400)
RBC: 2.84 MIL/uL — ABNORMAL LOW (ref 4.22–5.81)
RDW: 17.9 % — ABNORMAL HIGH (ref 11.5–15.5)
WBC Count: 7.2 10*3/uL (ref 4.0–10.5)
nRBC: 0 % (ref 0.0–0.2)

## 2022-07-01 LAB — SAMPLE TO BLOOD BANK

## 2022-07-01 LAB — MAGNESIUM: Magnesium: 1.7 mg/dL (ref 1.7–2.4)

## 2022-07-01 MED ORDER — HEPARIN SOD (PORK) LOCK FLUSH 100 UNIT/ML IV SOLN
500.0000 [IU] | Freq: Once | INTRAVENOUS | Status: AC
  Administered 2022-07-01: 500 [IU]

## 2022-07-01 MED ORDER — SODIUM CHLORIDE 0.9% FLUSH
10.0000 mL | Freq: Once | INTRAVENOUS | Status: AC
  Administered 2022-07-01: 10 mL

## 2022-07-07 ENCOUNTER — Inpatient Hospital Stay: Payer: Medicare Other

## 2022-07-07 ENCOUNTER — Other Ambulatory Visit: Payer: Self-pay

## 2022-07-07 DIAGNOSIS — C3412 Malignant neoplasm of upper lobe, left bronchus or lung: Secondary | ICD-10-CM

## 2022-07-07 DIAGNOSIS — Z5111 Encounter for antineoplastic chemotherapy: Secondary | ICD-10-CM | POA: Diagnosis not present

## 2022-07-07 DIAGNOSIS — Z95828 Presence of other vascular implants and grafts: Secondary | ICD-10-CM

## 2022-07-07 LAB — CMP (CANCER CENTER ONLY)
ALT: 9 U/L (ref 0–44)
AST: 11 U/L — ABNORMAL LOW (ref 15–41)
Albumin: 4 g/dL (ref 3.5–5.0)
Alkaline Phosphatase: 64 U/L (ref 38–126)
Anion gap: 5 (ref 5–15)
BUN: 17 mg/dL (ref 8–23)
CO2: 30 mmol/L (ref 22–32)
Calcium: 9 mg/dL (ref 8.9–10.3)
Chloride: 101 mmol/L (ref 98–111)
Creatinine: 0.47 mg/dL — ABNORMAL LOW (ref 0.61–1.24)
GFR, Estimated: >60 mL/min (ref >60)
Glucose, Bld: 127 mg/dL — ABNORMAL HIGH (ref 70–99)
Potassium: 4.6 mmol/L (ref 3.5–5.1)
Sodium: 136 mmol/L (ref 135–145)
Total Bilirubin: 0.5 mg/dL (ref 0.3–1.2)
Total Protein: 6.9 g/dL (ref 6.5–8.1)

## 2022-07-07 LAB — SAMPLE TO BLOOD BANK

## 2022-07-07 LAB — CBC WITH DIFFERENTIAL (CANCER CENTER ONLY)
Abs Immature Granulocytes: 0.01 10*3/uL (ref 0.00–0.07)
Basophils Absolute: 0 10*3/uL (ref 0.0–0.1)
Basophils Relative: 0 %
Eosinophils Absolute: 0 10*3/uL (ref 0.0–0.5)
Eosinophils Relative: 0 %
HCT: 26.6 % — ABNORMAL LOW (ref 39.0–52.0)
Hemoglobin: 8.6 g/dL — ABNORMAL LOW (ref 13.0–17.0)
Immature Granulocytes: 1 %
Lymphocytes Relative: 9 %
Lymphs Abs: 0.1 10*3/uL — ABNORMAL LOW (ref 0.7–4.0)
MCH: 31.4 pg (ref 26.0–34.0)
MCHC: 32.3 g/dL (ref 30.0–36.0)
MCV: 97.1 fL (ref 80.0–100.0)
Monocytes Absolute: 0.2 10*3/uL (ref 0.1–1.0)
Monocytes Relative: 12 %
Neutro Abs: 1.1 10*3/uL — ABNORMAL LOW (ref 1.7–7.7)
Neutrophils Relative %: 78 %
Platelet Count: 138 10*3/uL — ABNORMAL LOW (ref 150–400)
RBC: 2.74 MIL/uL — ABNORMAL LOW (ref 4.22–5.81)
RDW: 19.8 % — ABNORMAL HIGH (ref 11.5–15.5)
WBC Count: 1.5 10*3/uL — ABNORMAL LOW (ref 4.0–10.5)
nRBC: 0 % (ref 0.0–0.2)

## 2022-07-07 LAB — MAGNESIUM: Magnesium: 2 mg/dL (ref 1.7–2.4)

## 2022-07-07 MED ORDER — SODIUM CHLORIDE 0.9% FLUSH
10.0000 mL | Freq: Once | INTRAVENOUS | Status: AC
  Administered 2022-07-07: 10 mL

## 2022-07-07 MED ORDER — HEPARIN SOD (PORK) LOCK FLUSH 100 UNIT/ML IV SOLN
500.0000 [IU] | Freq: Once | INTRAVENOUS | Status: AC
  Administered 2022-07-07: 500 [IU]

## 2022-07-09 ENCOUNTER — Ambulatory Visit (HOSPITAL_COMMUNITY)
Admission: RE | Admit: 2022-07-09 | Discharge: 2022-07-09 | Disposition: A | Discharge status: Home / Self Care | Payer: Medicare Other | Source: Ambulatory Visit | Attending: Nurse Practitioner | Admitting: Nurse Practitioner

## 2022-07-09 DIAGNOSIS — R1310 Dysphagia, unspecified: Secondary | ICD-10-CM

## 2022-07-09 DIAGNOSIS — C3412 Malignant neoplasm of upper lobe, left bronchus or lung: Secondary | ICD-10-CM | POA: Diagnosis present

## 2022-07-13 MED FILL — Fosaprepitant Dimeglumine For IV Infusion 150 MG (Base Eq): INTRAVENOUS | Qty: 5 | Status: AC

## 2022-07-13 MED FILL — Dexamethasone Sodium Phosphate Inj 100 MG/10ML: INTRAMUSCULAR | Qty: 1 | Status: AC

## 2022-07-14 ENCOUNTER — Inpatient Hospital Stay: Payer: Medicare Other

## 2022-07-14 ENCOUNTER — Inpatient Hospital Stay: Payer: Medicare Other | Admitting: Internal Medicine

## 2022-07-14 ENCOUNTER — Other Ambulatory Visit: Payer: Self-pay

## 2022-07-14 ENCOUNTER — Inpatient Hospital Stay: Payer: Medicare Other | Admitting: Dietician

## 2022-07-14 VITALS — BP 123/89 | HR 100 | Temp 97.5°F | Wt 114.7 lb

## 2022-07-14 DIAGNOSIS — C3412 Malignant neoplasm of upper lobe, left bronchus or lung: Secondary | ICD-10-CM

## 2022-07-14 DIAGNOSIS — Z5111 Encounter for antineoplastic chemotherapy: Secondary | ICD-10-CM | POA: Diagnosis not present

## 2022-07-14 DIAGNOSIS — Z95828 Presence of other vascular implants and grafts: Secondary | ICD-10-CM

## 2022-07-14 DIAGNOSIS — C3490 Malignant neoplasm of unspecified part of unspecified bronchus or lung: Secondary | ICD-10-CM

## 2022-07-14 DIAGNOSIS — T451X5A Adverse effect of antineoplastic and immunosuppressive drugs, initial encounter: Secondary | ICD-10-CM

## 2022-07-14 LAB — CBC WITH DIFFERENTIAL (CANCER CENTER ONLY)
Abs Immature Granulocytes: 0.33 10*3/uL — ABNORMAL HIGH (ref 0.00–0.07)
Basophils Absolute: 0 10*3/uL (ref 0.0–0.1)
Basophils Relative: 1 %
Eosinophils Absolute: 0 10*3/uL (ref 0.0–0.5)
Eosinophils Relative: 0 %
HCT: 28.6 % — ABNORMAL LOW (ref 39.0–52.0)
Hemoglobin: 9 g/dL — ABNORMAL LOW (ref 13.0–17.0)
Immature Granulocytes: 8 %
Lymphocytes Relative: 12 %
Lymphs Abs: 0.5 10*3/uL — ABNORMAL LOW (ref 0.7–4.0)
MCH: 31 pg (ref 26.0–34.0)
MCHC: 31.5 g/dL (ref 30.0–36.0)
MCV: 98.6 fL (ref 80.0–100.0)
Monocytes Absolute: 0.8 10*3/uL (ref 0.1–1.0)
Monocytes Relative: 20 %
Neutro Abs: 2.4 10*3/uL (ref 1.7–7.7)
Neutrophils Relative %: 59 %
Platelet Count: 221 10*3/uL (ref 150–400)
RBC: 2.9 MIL/uL — ABNORMAL LOW (ref 4.22–5.81)
RDW: 21 % — ABNORMAL HIGH (ref 11.5–15.5)
Smear Review: NORMAL
WBC Count: 4.1 10*3/uL (ref 4.0–10.5)
nRBC: 0.5 % — ABNORMAL HIGH (ref 0.0–0.2)

## 2022-07-14 LAB — CMP (CANCER CENTER ONLY)
ALT: 9 U/L (ref 0–44)
AST: 12 U/L — ABNORMAL LOW (ref 15–41)
Albumin: 4.1 g/dL (ref 3.5–5.0)
Alkaline Phosphatase: 64 U/L (ref 38–126)
Anion gap: 6 (ref 5–15)
BUN: 12 mg/dL (ref 8–23)
CO2: 30 mmol/L (ref 22–32)
Calcium: 9.4 mg/dL (ref 8.9–10.3)
Chloride: 101 mmol/L (ref 98–111)
Creatinine: 0.44 mg/dL — ABNORMAL LOW (ref 0.61–1.24)
GFR, Estimated: >60 mL/min (ref >60)
Glucose, Bld: 99 mg/dL (ref 70–99)
Potassium: 4.1 mmol/L (ref 3.5–5.1)
Sodium: 137 mmol/L (ref 135–145)
Total Bilirubin: 0.4 mg/dL (ref 0.3–1.2)
Total Protein: 7 g/dL (ref 6.5–8.1)

## 2022-07-14 LAB — SAMPLE TO BLOOD BANK

## 2022-07-14 LAB — MAGNESIUM: Magnesium: 2 mg/dL (ref 1.7–2.4)

## 2022-07-14 MED ORDER — SODIUM CHLORIDE 0.9 % IV SOLN
150.0000 mg | Freq: Once | INTRAVENOUS | Status: AC
  Administered 2022-07-14: 150 mg via INTRAVENOUS
  Filled 2022-07-14: qty 150

## 2022-07-14 MED ORDER — HEPARIN SOD (PORK) LOCK FLUSH 100 UNIT/ML IV SOLN
500.0000 [IU] | Freq: Once | INTRAVENOUS | Status: AC | PRN
  Administered 2022-07-14: 500 [IU]

## 2022-07-14 MED ORDER — PALONOSETRON HCL INJECTION 0.25 MG/5ML
0.2500 mg | Freq: Once | INTRAVENOUS | Status: AC
  Administered 2022-07-14: 0.25 mg via INTRAVENOUS
  Filled 2022-07-14: qty 5

## 2022-07-14 MED ORDER — SODIUM CHLORIDE 0.9% FLUSH
10.0000 mL | Freq: Once | INTRAVENOUS | Status: AC
  Administered 2022-07-14: 10 mL

## 2022-07-14 MED ORDER — SODIUM CHLORIDE 0.9 % IV SOLN
10.0000 mg | Freq: Once | INTRAVENOUS | Status: AC
  Administered 2022-07-14: 10 mg via INTRAVENOUS
  Filled 2022-07-14: qty 10

## 2022-07-14 MED ORDER — SODIUM CHLORIDE 0.9 % IV SOLN
80.0000 mg/m2 | Freq: Once | INTRAVENOUS | Status: AC
  Administered 2022-07-14: 130 mg via INTRAVENOUS
  Filled 2022-07-14: qty 6.5

## 2022-07-14 MED ORDER — SODIUM CHLORIDE 0.9 % IV SOLN: Freq: Once | INTRAVENOUS | Status: AC

## 2022-07-14 MED ORDER — POTASSIUM CHLORIDE IN NACL 20-0.9 MEQ/L-% IV SOLN
Freq: Once | INTRAVENOUS | Status: AC
  Filled 2022-07-14: qty 1000

## 2022-07-14 MED ORDER — SODIUM CHLORIDE 0.9% FLUSH
10.0000 mL | INTRAVENOUS | Status: DC | PRN
  Administered 2022-07-14: 10 mL

## 2022-07-14 MED ORDER — SODIUM CHLORIDE 0.9 % IV SOLN
60.0000 mg/m2 | Freq: Once | INTRAVENOUS | Status: AC
  Administered 2022-07-14: 95 mg via INTRAVENOUS
  Filled 2022-07-14: qty 95

## 2022-07-14 MED ORDER — MAGNESIUM SULFATE 2 GM/50ML IV SOLN
2.0000 g | Freq: Once | INTRAVENOUS | Status: AC
  Administered 2022-07-14: 2 g via INTRAVENOUS
  Filled 2022-07-14: qty 50

## 2022-07-14 MED FILL — Dexamethasone Sodium Phosphate Inj 100 MG/10ML: INTRAMUSCULAR | Qty: 1 | Status: AC

## 2022-07-14 NOTE — Patient Instructions (Signed)
[facility]   Discharge Instructions: Thank you for choosing [facility] to provide your oncology and hematology care.   If you have a lab appointment with the [facility], please go directly to the [facility] and check in at the registration area.   Wear comfortable clothing and clothing appropriate for easy access to any Portacath or PICC line.   We strive to give you quality time with your provider. You may need to reschedule your appointment if you arrive late (15 or more minutes).  Arriving late affects you and other patients whose appointments are after yours.  Also, if you miss three or more appointments without notifying the office, you may be dismissed from the clinic at the provider's discretion.      For prescription refill requests, have your pharmacy contact our office and allow 72 hours for refills to be completed.    Today you received the following chemotherapy and/or immunotherapy agents: cisplatin and etoposide      To help prevent nausea and vomiting after your treatment, we encourage you to take your nausea medication as directed.  BELOW ARE SYMPTOMS THAT SHOULD BE REPORTED IMMEDIATELY: *FEVER GREATER THAN 100.4 F (38 C) OR HIGHER *CHILLS OR SWEATING *NAUSEA AND VOMITING THAT IS NOT CONTROLLED WITH YOUR NAUSEA MEDICATION *UNUSUAL SHORTNESS OF BREATH *UNUSUAL BRUISING OR BLEEDING *URINARY PROBLEMS (pain or burning when urinating, or frequent urination) *BOWEL PROBLEMS (unusual diarrhea, constipation, pain near the anus) TENDERNESS IN MOUTH AND THROAT WITH OR WITHOUT PRESENCE OF ULCERS (sore throat, sores in mouth, or a toothache) UNUSUAL RASH, SWELLING OR PAIN  UNUSUAL VAGINAL DISCHARGE OR ITCHING   Items with * indicate a potential emergency and should be followed up as soon as possible or go to the Emergency Department if any problems should occur.  Please show the CHEMOTHERAPY ALERT CARD or IMMUNOTHERAPY ALERT CARD  at check-in to the Emergency Department and triage nurse.  Should you have questions after your visit or need to cancel or reschedule your appointment, please contact [facility]  Dept: [phone]  and follow the prompts.  Office hours are 8:00 a.m. to 4:30 p.m. Monday - Friday. Please note that voicemails left after 4:00 p.m. may not be returned until the following business day.  We are closed weekends and major holidays. You have access to a nurse at all times for urgent questions. Please call the main number to the clinic Dept: [phone] and follow the prompts.   For any non-urgent questions, you may also contact your provider using MyChart. We now offer e-Visits for anyone 30 and older to request care online for non-urgent symptoms. For details visit [email or web address].   Also download the MyChart app! Go to the app store, search "MyChart", open the app, select [facility], and log in with your MyChart username and password.  Masks are optional in the cancer centers. If you would like for your care team to wear a mask while they are taking care of you, please let them know. For doctor visits, patients may have with them one support person who is at least 65 years old. At this time, visitors are not allowed in the infusion area.

## 2022-07-14 NOTE — Progress Notes (Signed)
Nutrition Follow-up:  Patient with small cell lung cancer. S/p PEG. He is receiving systemic chemotherapy with cisplatin concurrent with radiotherapy. First dose February 17, 2022. He had a break in treatment from March-June due to prolonged hospitalization. He resumed cycle #2 on 06/01/22 with dose reduced cisplatin.  Noted 7/13 MBS - D3 with thin liquids  Met with patient in infusion. He is pleased with increasing weight trends. He is feeling stronger and having less dyspnea with activity. Patient is planning to return to work in a few weeks. Patient has been eating one meal orally since s/p diet advancement. This is typically dinner (bacon wrapped filet, spaghetti, soups). He is drinking 2 Boost for added calories and protein. Patient reports decreasing Osmolite by one carton over the last week. Patient is currently giving 4 cartons Osmolite 1.5 @ 85 ml/hr x 7 hours. He denies nausea, vomiting, diarrhea, constipation.   Medications: reviewed  Labs: reviewed  Anthropometrics: Weight 114 lb 11.2 oz today increased   6/27 - 111 lb  6/5 - 109 5/30 - 105 lb 14.4 oz  4/13 - 102 lb 4.8 oz    Estimated Energy Needs   Kcals: 1290-4753 Protein: 94-109 Fluid: 2-2.3 L   NUTRITION DIAGNOSIS: Severe malnutrition improving    INTERVENTION:  Encouraged small frequent meals/snacks of soft moist foods  Discussed importance of adequate calories and protein to promote weight gain/maintenance Work to wean from tube feedings - continue 4 cartons Osmolite 1.5 via pump (1420 kcal, 59.6 g protein, 724 ml free water from formula) Encouraged to decrease by one carton/week  Continue drinking 2 Boost Plus/equivalent  Patient has contact information     MONITORING, EVALUATION, GOAL: weight trends, intake, tube feeding    NEXT VISIT: To be scheduled

## 2022-07-14 NOTE — Progress Notes (Signed)
[place] [phone]   [phone]  OFFICE PROGRESS NOTE  [name], NP [place] [address]  DIAGNOSIS: Limited stage (T1c, N1, M1a) small cell lung cancer presented with left upper lobe lung nodule as well as left hilar lymphadenopathy as well as right upper lobe lung nodule diagnosed in February 2023  PRIOR THERAPY: None  CURRENT THERAPY: Systemic chemotherapy with cisplatin 80 Mg/M2 on day 1 and 2 etoposide 100 Mg/M2 on days 1, 2 and 3 concurrent with radiotherapy.  First dose February 17, 2022.  Status post 3 cycles. Starting from cycle #2 his dose of cisplatin was reduced to 60 Mg/M2 on day 1 and etoposide reduced to 80 Mg/M2 on days 1, 2 and 3 every 3 weeks secondary to intolerance.  INTERVAL HISTORY: Mark Yu 66 y.o. male returns to the clinic today for follow-up visit.  The patient is feeling fine today with no concerning complaints.  He started eating some solid food but still dependent on the PEG tube for nutrition.  He denied having any current chest pain, shortness of breath, cough or hemoptysis.  He denied having any nausea, vomiting, diarrhea or constipation.  He denied having any headache or visual changes.  He has no weight loss or night sweats.  He is here today for evaluation before starting cycle #4 of his treatment.   MEDICAL HISTORY: Past Medical History:  Diagnosis Date   Anxiety    tx xanax   COPD (chronic obstructive pulmonary disease) ([place])    tx inhalers   Depression    Dyspnea    with exertion   HTN (hypertension)    tx amlodipine   Hyponatremia 03/08/2022    ALLERGIES:  is allergic to amoxicillin.  MEDICATIONS:  Current Outpatient Medications  Medication Sig Dispense Refill   albuterol (PROVENTIL) (2.5 MG/3ML) 0.083% nebulizer solution Take 2.5 mg by nebulization every 6 (six) hours as needed for wheezing or shortness of breath.     albuterol (VENTOLIN HFA) 108 (90 Base)  MCG/ACT inhaler Inhale 2 puffs into the lungs every 4 (four) hours as needed for wheezing or shortness of breath.     ALPRAZolam (XANAX) 0.5 MG tablet Take 0.5 mg by mouth 2 (two) times daily as needed. (Patient not taking: Reported on 06/11/2022)     ALPRAZolam (XANAX) 1 MG tablet TAKE 1/2 TO 1 TABLET BY MOUTH TWO TIMES A DAY AS NEEDED FOR ANXIETY 30 tablet 0   amLODipine (NORVASC) 5 MG tablet Take 5 mg by mouth daily.     BREZTRI AEROSPHERE 160-9-4.8 MCG/ACT AERO Inhale 2 puffs into the lungs in the morning and at bedtime.     Calcium Carb-Cholecalciferol 500-10 MG-MCG TABS Take 1 tablet by mouth daily. (Patient not taking: Reported on 06/11/2022)     cholecalciferol (VITAMIN D) 25 MCG (1000 UNIT) tablet Take 1,000 Units by mouth daily. (Patient not taking: Reported on 06/11/2022)     dextromethorphan-guaiFENesin (ROBITUSSIN-DM) 10-100 MG/5ML liquid Take 10 mLs by mouth 2 (two) times daily.     fluticasone (FLONASE) 50 MCG/ACT nasal spray Place 2 sprays into both nostrils daily as needed for allergies or rhinitis.     guaiFENesin (MUCINEX) 600 MG 12 hr tablet Take 600 mg by mouth every evening.     lidocaine-prilocaine (EMLA) cream Apply to the Port-A-Cath site 30-60 minutes before chemotherapy (Patient not taking: Reported on 06/11/2022) 30 g 0   Nutritional Supplements (FEEDING SUPPLEMENT, OSMOLITE 1.5 CAL,) LIQD Place 1,000  mLs into feeding tube continuous.  0   Nutritional Supplements (FEEDING SUPPLEMENT, PROSOURCE TF,) liquid Place 45 mLs into feeding tube 3 (three) times daily. (Patient not taking: Reported on 06/11/2022) 30 mL 3   pantoprazole (PROTONIX) 40 MG tablet Take 40 mg by mouth daily. (Patient not taking: Reported on 06/11/2022)     pantoprazole sodium (PROTONIX) 40 mg Place 40 mg into feeding tube daily. 30 packet 3   predniSONE (DELTASONE) 10 MG tablet Take 2 tablets (20 mg total) by mouth daily with breakfast. 180 tablet 0   prochlorperazine (COMPAZINE) 10 MG tablet Take 1 tablet (10  mg total) by mouth every 6 (six) hours as needed for nausea or vomiting. (Patient not taking: Reported on 06/23/2022) 30 tablet 0   Water For Irrigation, Sterile (FREE WATER) SOLN Place 150 mLs into feeding tube 6 (six) times daily. (Patient not taking: Reported on 06/11/2022) 30 mL 3   No current facility-administered medications for this visit.    SURGICAL HISTORY:  Past Surgical History:  Procedure Laterality Date   BRONCHIAL BIOPSY  02/02/2022   Procedure: BRONCHIAL BIOPSIES;  Surgeon: Collene Gobble, MD;  Location: [facility] ENDOSCOPY;  Service: Pulmonary;;   BRONCHIAL BRUSHINGS  02/02/2022   Procedure: BRONCHIAL BRUSHINGS;  Surgeon: Collene Gobble, MD;  Location: [facility] ENDOSCOPY;  Service: Pulmonary;;   BRONCHIAL NEEDLE ASPIRATION BIOPSY  02/02/2022   Procedure: BRONCHIAL NEEDLE ASPIRATION BIOPSIES;  Surgeon: Collene Gobble, MD;  Location: [facility] ENDOSCOPY;  Service: Pulmonary;;   IR GASTROSTOMY TUBE MOD SED  04/03/2022   IR IMAGING GUIDED PORT INSERTION  02/24/2022   MRI     NO PAST SURGERIES     VIDEO BRONCHOSCOPY WITH ENDOBRONCHIAL ULTRASOUND N/A 02/02/2022   Procedure: VIDEO BRONCHOSCOPY WITH ENDOBRONCHIAL ULTRASOUND;  Surgeon: Collene Gobble, MD;  Location: [facility] ENDOSCOPY;  Service: Pulmonary;  Laterality: N/A;   VIDEO BRONCHOSCOPY WITH RADIAL ENDOBRONCHIAL ULTRASOUND  02/02/2022   Procedure: VIDEO BRONCHOSCOPY WITH RADIAL ENDOBRONCHIAL ULTRASOUND;  Surgeon: Collene Gobble, MD;  Location: [facility] ENDOSCOPY;  Service: Pulmonary;;    REVIEW OF SYSTEMS:  A comprehensive review of systems was negative except for: Constitutional: positive for fatigue Respiratory: positive for cough Gastrointestinal: positive for dysphagia   PHYSICAL EXAMINATION: General appearance: alert, cooperative, and no distress Head: Normocephalic, without obvious abnormality, atraumatic Neck: no adenopathy, no JVD, supple, symmetrical, trachea midline, and thyroid not enlarged, symmetric, no tenderness/mass/nodules Lymph nodes:  Cervical, supraclavicular, and axillary nodes normal. Resp: clear to auscultation bilaterally Back: symmetric, no curvature. ROM normal. No CVA tenderness. Cardio: regular rate and rhythm, S1, S2 normal, no murmur, click, rub or gallop GI: soft, non-tender; bowel sounds normal; no masses,  no organomegaly Extremities: extremities normal, atraumatic, no cyanosis or edema  ECOG PERFORMANCE STATUS: 1 - Symptomatic but completely ambulatory  Blood pressure 123/89, pulse 100, temperature (!) 97.5 F (36.4 C), temperature source Tympanic, weight 114 lb 11.2 oz (52 kg), SpO2 96 %.  LABORATORY DATA: Lab Results  Component Value Date   WBC 4.1 07/14/2022   HGB 9.0 (L) 07/14/2022   HCT 28.6 (L) 07/14/2022   MCV 98.6 07/14/2022   PLT 221 07/14/2022      Chemistry      Component Value Date/Time   NA 136 07/07/2022 1322   K 4.6 07/07/2022 1322   CL 101 07/07/2022 1322   CO2 30 07/07/2022 1322   BUN 17 07/07/2022 1322   CREATININE 0.47 (L) 07/07/2022 1322      Component Value Date/Time   CALCIUM  9.0 07/07/2022 1322   ALKPHOS 64 07/07/2022 1322   AST 11 (L) 07/07/2022 1322   ALT 9 07/07/2022 1322   BILITOT 0.5 07/07/2022 1322       RADIOGRAPHIC STUDIES: DG SWALLOW FUNC OP MEDICARE SPEECH PATH  Result Date: 07/09/2022 Table formatting from the original result was not included. Objective Swallowing Evaluation: Type of Study: MBS-Modified Barium Swallow Study  Patient Details Name: Mark Yu MRN: [id number] Date of Birth: [birth year] Today's Date: 07/09/2022 Time: SLP Start Time (ACUTE ONLY): 1305 -SLP Stop Time (ACUTE ONLY): 1400 SLP Time Calculation (min) (ACUTE ONLY): 55 min Past Medical History: Past Medical History: Diagnosis Date  Anxiety   tx xanax  COPD (chronic obstructive pulmonary disease) ([facility])   tx inhalers  Depression   Dyspnea   with exertion  HTN (hypertension)   tx amlodipine  Hyponatremia 03/08/2022 Past Surgical History: Past Surgical History: Procedure Laterality Date   BRONCHIAL BIOPSY  02/02/2022  Procedure: BRONCHIAL BIOPSIES;  Surgeon: Collene Gobble, MD;  Location: [facility] ENDOSCOPY;  Service: Pulmonary;;  BRONCHIAL BRUSHINGS  02/02/2022  Procedure: BRONCHIAL BRUSHINGS;  Surgeon: Collene Gobble, MD;  Location: [facility] ENDOSCOPY;  Service: Pulmonary;;  BRONCHIAL NEEDLE ASPIRATION BIOPSY  02/02/2022  Procedure: BRONCHIAL NEEDLE ASPIRATION BIOPSIES;  Surgeon: Collene Gobble, MD;  Location: [facility] ENDOSCOPY;  Service: Pulmonary;;  IR GASTROSTOMY TUBE MOD SED  04/03/2022  IR IMAGING GUIDED PORT INSERTION  02/24/2022  MRI    NO PAST SURGERIES    VIDEO BRONCHOSCOPY WITH ENDOBRONCHIAL ULTRASOUND N/A 02/02/2022  Procedure: VIDEO BRONCHOSCOPY WITH ENDOBRONCHIAL ULTRASOUND;  Surgeon: Collene Gobble, MD;  Location: [facility] ENDOSCOPY;  Service: Pulmonary;  Laterality: N/A;  VIDEO BRONCHOSCOPY WITH RADIAL ENDOBRONCHIAL ULTRASOUND  02/02/2022  Procedure: VIDEO BRONCHOSCOPY WITH RADIAL ENDOBRONCHIAL ULTRASOUND;  Surgeon: Collene Gobble, MD;  Location: [facility] ENDOSCOPY;  Service: Pulmonary;; HPI: : Male with medical history of recently diagnosed small cell lung cancer, COPD, anxiety, HTN, and depression. He presented to the ED due to progressive shortness of breath with minimal exertion x2 days. he had been finished a steroid taper shortly before admission and had recently been prescribed an appetite stimulant.     Significant Events:  3/12- admission  3/14- intubation; OGT placement; initial RD assessment; tube feeding initiation   3/20- extubation + re-intubation; OGT removal + replacement  3/22- extubation + re-intubation; OGT removal + replacement  3/24- tracheostomy + diagnostic bronch; OGT removed; small bore NGT placed in R nare (distal gastric per abdominal x-ray) per CCM notes.  Pt has undergone PMSV trials with adequate tolerance and phonation but rapid fatigue and increased amount of secretions present.   MBS completed showing severe dysphagia- Pt is s/p PEG. He has been libertated from the vent and states he may  get trach removed. SLP following for dysphagia treatment.  Pt decannulated on 4/19 and repeat MBS 04/16/2022 as IP continued to show severe dysphagia. Pt dc'd home with HH therapies including PT/OT/SLP.  He has been conducting CTAR, effortful swallow, and admits to consuming some intake by mouth.  Pt reports he has a few chemo treatments remaining for his lung cancer - and XRT was completed while he was in the hospital March/April 2023.  Repeat MBS indicated to determine if any improvement present s/p decannulation and small bore feeding tube removal.     Chest CT 06/04/2022 Interval development of nodular areas of architectural distortion  in the right upper lobe, favored to be of infectious or inflammatory .  Subjective: pt awake in wheelchair, walking with  cane per his report  Recommendations for follow up therapy are one component of a multi-disciplinary discharge planning process, led by the attending physician.  Recommendations may be updated based on patient status, additional functional criteria and insurance authorization. Assessment / Plan / Recommendation   07/09/2022   3:00 PM Clinical Impressions Clinical Impression Patient presents with moderately improved swallow function compared to prior exam 04/16/2022.  He continues with hypomobility of pharyngeal/laryngeal structures allowing pharyngeal retention - contributing to decreased epiglottic deflection and impaired traction on UES.  Laryngeal closure remains impaired but improved compared to prior exam resulting in inconsistent laryngeal penetration of liquids during the swallow.  With small single liquid boluses, only trace penetration *and minimal aspiration with thin) observed both during and after the swallow and is able to be cleared with cued throat clear/cough and re-swallow.  Pt continues to have retained secretions that mix with barium. Pharyngeal retention noted across all boluses - but dry swallows help to diminish retention and expectoration  *non- cued* cleared most residuals pt did not fully clear from pharynx.  Chin tuck posture not tested due to pyriform sinus retention with overt aspiration.  Small single boluses with head neutral tolerated best.  Patient was challenged to consume sequential large boluses of thin liquids resulting in larger amount of aspiration after the swallow as gross retention spilled into open airway.  Reflexive cough did not clear aspirates. If pt is coughing with intake - he is likely aspirating - and advise he cough to clear and take rest breaks. Fortunately pt continues with strong cough and expectoration clearing portion of pharyngeal retention.   Recommend consider initiation of po intake with strict precautions including multiple effortful dry swallows with each bolus, intermittent strong cough and expectoration as needed.  Reviewed new MBS with prior flouro/video loops side by side to facilitate understanding. Also discussed patient's chemotherapy/immune system compromise may increase his risk of aspiration pneumonia and monitoring for s/s of aspiration pneumonia is important Oral care continues to be important given potential for secretion aspiration.  Advised to hold off on trying to significantly advance po until following chemo treatment to allow immune system recovery given likely ongoing subtle aspiration. Continue SLP for dysphagia treatment, especially as pt's goal is to transition to full po nutrition and dc feeding tube. Of note, n flouro loop #44, image 73/106, pt appears with ? posterior pharyngeal wall muscular protrusion - superior to deflected epiglottis that did not impair pharyngeal clearance nor airway protection- Observed only once during entire study and appears to be an incidental finding; Not confirmed with radiologist as he was gone for the day when visualized.   SLP Visit Diagnosis Dysphagia, pharyngeal phase (R13.13);Dysphagia, pharyngoesophageal phase (R13.14) Impact on safety and function  Moderate aspiration risk     07/09/2022   3:00 PM Treatment Recommendations Treatment Recommendations Defer treatment plan to f/u with SLP     07/09/2022   3:00 PM Prognosis Prognosis for Safe Diet Advancement Good   07/09/2022   3:00 PM Diet Recommendations SLP Diet Recommendations Dysphagia 3 (Mech soft) solids;Thin liquid Liquid Administration via Cup Medication Administration Crushed with puree Compensations Small sips/bites;Slow rate;Multiple dry swallows after each bite/sip;Effortful swallow Postural Changes Seated upright at 90 degrees;Remain semi-upright after after feeds/meals (Comment)     07/09/2022   3:00 PM Other Recommendations Recommended Consults -- Oral Care Recommendations Oral care QID Follow Up Recommendations Home health SLP Assistance recommended at discharge Frequent or constant Supervision/Assistance Functional Status Assessment Patient has had a recent decline  in their functional status and demonstrates the ability to make significant improvements in function in a reasonable and predictable amount of time.   04/16/2022   9:09 AM Frequency and Duration  Speech Therapy Frequency (ACUTE ONLY) min 2x/week     07/09/2022   3:00 PM Oral Phase Oral Phase WFL Oral - Nectar Teaspoon WFL Oral - Nectar Cup WFL Oral - Nectar Straw WFL Oral - Thin Teaspoon WFL Oral - Thin Cup Premature spillage;WFL Oral - Thin Straw Premature spillage;WFL Oral - Puree WFL;Piecemeal swallowing Oral - Mech Soft [facility]    07/09/2022   3:00 PM Pharyngeal Phase Pharyngeal Phase Impaired Pharyngeal- Nectar Teaspoon Reduced pharyngeal peristalsis;Reduced epiglottic inversion;Reduced anterior laryngeal mobility;Reduced laryngeal elevation;Reduced airway/laryngeal closure;Reduced tongue base retraction;Pharyngeal residue - valleculae;Pharyngeal residue - pyriform Pharyngeal Material does not enter airway Pharyngeal- Nectar Cup Reduced pharyngeal peristalsis;Reduced epiglottic inversion;Reduced anterior laryngeal mobility;Reduced laryngeal  elevation;Reduced airway/laryngeal closure;Reduced tongue base retraction;Penetration/Aspiration during swallow;Penetration/Apiration after swallow;Pharyngeal residue - valleculae;Pharyngeal residue - pyriform Pharyngeal Material enters airway, remains ABOVE vocal cords and not ejected out Pharyngeal- Nectar Straw Reduced pharyngeal peristalsis;Reduced epiglottic inversion;Reduced anterior laryngeal mobility;Reduced laryngeal elevation;Delayed swallow initiation-pyriform sinuses;Reduced airway/laryngeal closure;Reduced tongue base retraction;Pharyngeal residue - valleculae;Pharyngeal residue - pyriform;Penetration/Apiration after swallow;Penetration/Aspiration during swallow Pharyngeal Material enters airway, remains ABOVE vocal cords and not ejected out Pharyngeal- Thin Teaspoon Reduced pharyngeal peristalsis;Reduced epiglottic inversion;Reduced anterior laryngeal mobility;Reduced laryngeal elevation;Reduced airway/laryngeal closure;Reduced tongue base retraction;Pharyngeal residue - valleculae;Pharyngeal residue - pyriform Pharyngeal Material does not enter airway Pharyngeal- Thin Cup Reduced pharyngeal peristalsis;Reduced epiglottic inversion;Reduced anterior laryngeal mobility;Reduced laryngeal elevation;Reduced airway/laryngeal closure;Reduced tongue base retraction;Pharyngeal residue - pyriform;Pharyngeal residue - valleculae;Penetration/Apiration after swallow;Penetration/Aspiration during swallow Pharyngeal Material enters airway, CONTACTS cords and not ejected out;Material enters airway, passes BELOW cords without attempt by patient to eject out (silent aspiration);Material enters airway, passes BELOW cords and not ejected out despite cough attempt by patient Pharyngeal- Thin Straw Reduced pharyngeal peristalsis;Reduced epiglottic inversion;Reduced anterior laryngeal mobility;Reduced laryngeal elevation;Reduced airway/laryngeal closure;Reduced tongue base retraction;Pharyngeal residue - pyriform;Pharyngeal  residue - valleculae;Penetration/Aspiration during swallow;Penetration/Apiration after swallow;Trace aspiration Pharyngeal Material enters airway, passes BELOW cords then ejected out;Material enters airway, remains ABOVE vocal cords and not ejected out Pharyngeal- Puree Reduced pharyngeal peristalsis;Reduced epiglottic inversion;Reduced anterior laryngeal mobility;Reduced laryngeal elevation;Reduced airway/laryngeal closure;Reduced tongue base retraction;Pharyngeal residue - valleculae;Pharyngeal residue - pyriform Pharyngeal Material does not enter airway Pharyngeal- Mechanical Soft Reduced pharyngeal peristalsis;Reduced epiglottic inversion;Reduced anterior laryngeal mobility;Reduced laryngeal elevation;Reduced airway/laryngeal closure;Reduced tongue base retraction;Pharyngeal residue - valleculae;Pharyngeal residue - pyriform Pharyngeal Material does not enter airway Pharyngeal Comment On flouro loop #44, image 73/106, pt appears with ? posterior pharyngeal wall muscular protrusion - superior to deflected epiglottis that did not impair pharyngeal clearance nor airway protection- Observed only once during entire study and appears to be an incidental finding; Not confirmed with radiologist as he was gone for the day when visualized    07/09/2022   3:00 PM Cervical Esophageal Phase  Cervical Esophageal Phase Impaired Cervical Esophageal Comment Impaired laryngeal closure continues to result in pyriform sinus retention across all consistencies; pyriform retention spills into open airway- posterior trach [name] 07/09/2022, 5:07 PM  Kathleen Lime, MS [place] Office [phone] Pager [phone]                  CLINICAL DATA:  Follow-up aspiration. EXAM: MODIFIED BARIUM SWALLOW TECHNIQUE: Different consistencies of barium were administered orally to the patient by the Speech Pathologist. Imaging of the pharynx was performed in the lateral projection. The radiologist was present in  the  fluoroscopy room for this study, providing personal supervision. FLUOROSCOPY: Radiation Exposure Index (as provided by the fluoroscopic device): 4.3 mGy COMPARISON:  Prior study 04/16/2022 FINDINGS: Vestibular  Penetration:  Episodes of laryngeal penetration. Aspiration: Episodes of silent aspiration mainly posteriorly from the piriform sinus. This was more pronounced with large volume swallows. Overall improved since the prior study. Other:  Moderate residuals. IMPRESSION: As above. Please refer to the Speech Pathologists report for complete details and recommendations. Electronically Signed   By: Marijo Sanes M.D.   On: 07/09/2022 14:33   ASSESSMENT AND PLAN: This is a very pleasant 66 years old white male recently diagnosed with small cell lung cancer likely extensive stage (T1c, N1, M1a) disease diagnosed and February 2023 with the bilateral nodules in the right upper lobe as well as the left upper lobe but this could be also treated as limited stage disease because of the proximity of the lesions. The patient is currently undergoing a course of systemic chemotherapy with cisplatin 80 Mg/M2 on day 1 and etoposide 100 Mg/M2 on days 1, 2 and 3 every 3.  Status post 3 cycles.  The first cycle of his treatment well with no concerning adverse effect but unfortunately he was admitted 2 weeks later with respiratory failure and multifocal pneumonia as well as radiation pneumonitis.  The patient had prolonged hospitalization for treatment of his condition but he is finally at home after rehabilitation. Starting from cycle #2 his dose of cisplatin was reduced to 60 Mg/M2 on day 1 and 2 etoposide reduced to 80 Mg/M2 on days 1, 2 and 3 every 3 weeks. The patient has been tolerating this treatment much better. I recommended for him to proceed with cycle #4 today as planned. I will see him back for follow-up visit in 1 months for evaluation with repeat CT scan of the chest for restaging of his disease. The patient  was advised to call immediately if he has any other concerning symptoms in the interval. The patient voices understanding of current disease status and treatment options and is in agreement with the current care plan.  All questions were answered. The patient knows to call the clinic with any problems, questions or concerns. We can certainly see the patient much sooner if necessary.   Disclaimer: This note was dictated with voice recognition software. Similar sounding words can inadvertently be transcribed and may not be corrected upon review.

## 2022-07-15 ENCOUNTER — Telehealth: Payer: Self-pay | Admitting: Internal Medicine

## 2022-07-15 ENCOUNTER — Inpatient Hospital Stay: Payer: Medicare Other

## 2022-07-15 VITALS — BP 118/78 | HR 99 | Temp 97.7°F | Resp 18

## 2022-07-15 DIAGNOSIS — Z5111 Encounter for antineoplastic chemotherapy: Secondary | ICD-10-CM | POA: Diagnosis not present

## 2022-07-15 DIAGNOSIS — C3412 Malignant neoplasm of upper lobe, left bronchus or lung: Secondary | ICD-10-CM

## 2022-07-15 MED ORDER — HEPARIN SOD (PORK) LOCK FLUSH 100 UNIT/ML IV SOLN
500.0000 [IU] | Freq: Once | INTRAVENOUS | Status: AC | PRN
  Administered 2022-07-15: 500 [IU]

## 2022-07-15 MED ORDER — SODIUM CHLORIDE 0.9 % IV SOLN
10.0000 mg | Freq: Once | INTRAVENOUS | Status: AC
  Administered 2022-07-15: 10 mg via INTRAVENOUS
  Filled 2022-07-15: qty 10

## 2022-07-15 MED ORDER — SODIUM CHLORIDE 0.9% FLUSH
10.0000 mL | INTRAVENOUS | Status: DC | PRN
  Administered 2022-07-15: 10 mL

## 2022-07-15 MED ORDER — SODIUM CHLORIDE 0.9 % IV SOLN: Freq: Once | INTRAVENOUS | Status: AC

## 2022-07-15 MED ORDER — SODIUM CHLORIDE 0.9 % IV SOLN
80.0000 mg/m2 | Freq: Once | INTRAVENOUS | Status: AC
  Administered 2022-07-15: 130 mg via INTRAVENOUS
  Filled 2022-07-15: qty 6.5

## 2022-07-15 MED FILL — Dexamethasone Sodium Phosphate Inj 100 MG/10ML: INTRAMUSCULAR | Qty: 1 | Status: AC

## 2022-07-15 NOTE — Patient Instructions (Addendum)
[facility]  Discharge Instructions: Thank you for choosing [facility] to provide your oncology and hematology care.   If you have a lab appointment with the [facility], please go directly to the [facility] and check in at the registration area.   Wear comfortable clothing and clothing appropriate for easy access to any Portacath or PICC line.   We strive to give you quality time with your provider. You may need to reschedule your appointment if you arrive late (15 or more minutes).  Arriving late affects you and other patients whose appointments are after yours.  Also, if you miss three or more appointments without notifying the office, you may be dismissed from the clinic at the provider's discretion.      For prescription refill requests, have your pharmacy contact our office and allow 72 hours for refills to be completed.    Today you received the following chemotherapy and/or immunotherapy agents: Etoposide      To help prevent nausea and vomiting after your treatment, we encourage you to take your nausea medication as directed.  BELOW ARE SYMPTOMS THAT SHOULD BE REPORTED IMMEDIATELY: *FEVER GREATER THAN 100.4 F (38 C) OR HIGHER *CHILLS OR SWEATING *NAUSEA AND VOMITING THAT IS NOT CONTROLLED WITH YOUR NAUSEA MEDICATION *UNUSUAL SHORTNESS OF BREATH *UNUSUAL BRUISING OR BLEEDING *URINARY PROBLEMS (pain or burning when urinating, or frequent urination) *BOWEL PROBLEMS (unusual diarrhea, constipation, pain near the anus) TENDERNESS IN MOUTH AND THROAT WITH OR WITHOUT PRESENCE OF ULCERS (sore throat, sores in mouth, or a toothache) UNUSUAL RASH, SWELLING OR PAIN  UNUSUAL VAGINAL DISCHARGE OR ITCHING   Items with * indicate a potential emergency and should be followed up as soon as possible or go to the Emergency Department if any problems should occur.  Please show the CHEMOTHERAPY ALERT CARD or IMMUNOTHERAPY ALERT CARD at check-in to  the Emergency Department and triage nurse.  Should you have questions after your visit or need to cancel or reschedule your appointment, please contact [facility]  Dept: [phone]  and follow the prompts.  Office hours are 8:00 a.m. to 4:30 p.m. Monday - Friday. Please note that voicemails left after 4:00 p.m. may not be returned until the following business day.  We are closed weekends and major holidays. You have access to a nurse at all times for urgent questions. Please call the main number to the clinic Dept: [phone] and follow the prompts.   For any non-urgent questions, you may also contact your provider using MyChart. We now offer e-Visits for anyone 65 and older to request care online for non-urgent symptoms. For details visit [email or web address].   Also download the MyChart app! Go to the app store, search "MyChart", open the app, select [facility], and log in with your MyChart username and password.  Masks are optional in the cancer centers. If you would like for your care team to wear a mask while they are taking care of you, please let them know. For doctor visits, patients may have with them one support person who is at least 65 years old. At this time, visitors are not allowed in the infusion area.

## 2022-07-15 NOTE — Telephone Encounter (Signed)
Scheduled per 07/18 los, patient has been called and notified.

## 2022-07-16 ENCOUNTER — Other Ambulatory Visit: Payer: Self-pay

## 2022-07-16 ENCOUNTER — Inpatient Hospital Stay: Payer: Medicare Other

## 2022-07-16 VITALS — BP 128/75 | HR 99 | Temp 98.0°F | Resp 16

## 2022-07-16 DIAGNOSIS — C3412 Malignant neoplasm of upper lobe, left bronchus or lung: Secondary | ICD-10-CM

## 2022-07-16 DIAGNOSIS — Z5111 Encounter for antineoplastic chemotherapy: Secondary | ICD-10-CM | POA: Diagnosis not present

## 2022-07-16 MED ORDER — SODIUM CHLORIDE 0.9% FLUSH
10.0000 mL | INTRAVENOUS | Status: DC | PRN
  Administered 2022-07-16: 10 mL

## 2022-07-16 MED ORDER — SODIUM CHLORIDE 0.9 % IV SOLN: Freq: Once | INTRAVENOUS | Status: AC

## 2022-07-16 MED ORDER — HEPARIN SOD (PORK) LOCK FLUSH 100 UNIT/ML IV SOLN
500.0000 [IU] | Freq: Once | INTRAVENOUS | Status: AC | PRN
  Administered 2022-07-16: 500 [IU]

## 2022-07-16 MED ORDER — SODIUM CHLORIDE 0.9 % IV SOLN
10.0000 mg | Freq: Once | INTRAVENOUS | Status: AC
  Administered 2022-07-16: 10 mg via INTRAVENOUS
  Filled 2022-07-16: qty 10

## 2022-07-16 MED ORDER — SODIUM CHLORIDE 0.9 % IV SOLN
80.0000 mg/m2 | Freq: Once | INTRAVENOUS | Status: AC
  Administered 2022-07-16: 130 mg via INTRAVENOUS
  Filled 2022-07-16: qty 6.5

## 2022-07-16 NOTE — Patient Instructions (Signed)
[facility]COL[facility]  Discharge Instructions: Thank you for choosing [facility] to provide your oncology and hematology care.   If you have a lab appointment with the [facility], please go directly to the [facility] and check in at the registration area.   Wear comfortable clothing and clothing appropriate for easy access to any Portacath or PICC line.   We strive to give you quality time with your provider. You may need to reschedule your appointment if you arrive late (15 or more minutes).  Arriving late affects you and other patients whose appointments are after yours.  Also, if you miss three or more appointments without notifying the office, you may be dismissed from the clinic at the provider's discretion.      For prescription refill requests, have your pharmacy contact our office and allow 72 hours for refills to be completed.    Today you received the following chemotherapy and/or immunotherapy agent: Etoposide      To help prevent nausea and vomiting after your treatment, we encourage you to take your nausea medication as directed.  BELOW ARE SYMPTOMS THAT SHOULD BE REPORTED IMMEDIATELY: *FEVER GREATER THAN 100.4 F (38 C) OR HIGHER *CHILLS OR SWEATING *NAUSEA AND VOMITING THAT IS NOT CONTROLLED WITH YOUR NAUSEA MEDICATION *UNUSUAL SHORTNESS OF BREATH *UNUSUAL BRUISING OR BLEEDING *URINARY PROBLEMS (pain or burning when urinating, or frequent urination) *BOWEL PROBLEMS (unusual diarrhea, constipation, pain near the anus) TENDERNESS IN MOUTH AND THROAT WITH OR WITHOUT PRESENCE OF ULCERS (sore throat, sores in mouth, or a toothache) UNUSUAL RASH, SWELLING OR PAIN  UNUSUAL VAGINAL DISCHARGE OR ITCHING   Items with * indicate a potential emergency and should be followed up as soon as possible or go to the Emergency Department if any problems should occur.  Please show the CHEMOTHERAPY ALERT CARD or IMMUNOTHERAPY ALERT CARD at check-in to  the Emergency Department and triage nurse.  Should you have questions after your visit or need to cancel or reschedule your appointment, please contact [facility]  Dept: [phone]  and follow the prompts.  Office hours are 8:00 a.m. to 4:30 p.m. Monday - Friday. Please note that voicemails left after 4:00 p.m. may not be returned until the following business day.  We are closed weekends and major holidays. You have access to a nurse at all times for urgent questions. Please call the main number to the clinic Dept: [phone] and follow the prompts.   For any non-urgent questions, you may also contact your provider using MyChart. We now offer e-Visits for anyone 53 and older to request care online for non-urgent symptoms. For details visit [email or web address].   Also download the MyChart app! Go to the app store, search "MyChart", open the app, select [facility], and log in with your MyChart username and password.  Masks are optional in the cancer centers. If you would like for your care team to wear a mask while they are taking care of you, please let them know. For doctor visits, patients may have with them one support person who is at least 65 years old. At this time, visitors are not allowed in the infusion area.

## 2022-07-20 ENCOUNTER — Inpatient Hospital Stay: Payer: Medicare Other

## 2022-07-20 ENCOUNTER — Other Ambulatory Visit: Payer: Self-pay

## 2022-07-20 VITALS — BP 116/88 | HR 92 | Temp 98.7°F | Resp 17

## 2022-07-20 DIAGNOSIS — C3490 Malignant neoplasm of unspecified part of unspecified bronchus or lung: Secondary | ICD-10-CM

## 2022-07-20 DIAGNOSIS — Z5111 Encounter for antineoplastic chemotherapy: Secondary | ICD-10-CM | POA: Diagnosis not present

## 2022-07-20 DIAGNOSIS — Z95828 Presence of other vascular implants and grafts: Secondary | ICD-10-CM

## 2022-07-20 DIAGNOSIS — C3412 Malignant neoplasm of upper lobe, left bronchus or lung: Secondary | ICD-10-CM

## 2022-07-20 LAB — CMP (CANCER CENTER ONLY)
ALT: 10 U/L (ref 0–44)
AST: 12 U/L — ABNORMAL LOW (ref 15–41)
Albumin: 4 g/dL (ref 3.5–5.0)
Alkaline Phosphatase: 58 U/L (ref 38–126)
Anion gap: 4 — ABNORMAL LOW (ref 5–15)
BUN: 15 mg/dL (ref 8–23)
CO2: 32 mmol/L (ref 22–32)
Calcium: 9.2 mg/dL (ref 8.9–10.3)
Chloride: 99 mmol/L (ref 98–111)
Creatinine: 0.43 mg/dL — ABNORMAL LOW (ref 0.61–1.24)
GFR, Estimated: >60 mL/min (ref >60)
Glucose, Bld: 105 mg/dL — ABNORMAL HIGH (ref 70–99)
Potassium: 4.8 mmol/L (ref 3.5–5.1)
Sodium: 135 mmol/L (ref 135–145)
Total Bilirubin: 0.5 mg/dL (ref 0.3–1.2)
Total Protein: 6.5 g/dL (ref 6.5–8.1)

## 2022-07-20 LAB — CBC WITH DIFFERENTIAL (CANCER CENTER ONLY)
Abs Immature Granulocytes: 0.06 10*3/uL (ref 0.00–0.07)
Basophils Absolute: 0 10*3/uL (ref 0.0–0.1)
Basophils Relative: 0 %
Eosinophils Absolute: 0 10*3/uL (ref 0.0–0.5)
Eosinophils Relative: 0 %
HCT: 24.1 % — ABNORMAL LOW (ref 39.0–52.0)
Hemoglobin: 7.9 g/dL — ABNORMAL LOW (ref 13.0–17.0)
Immature Granulocytes: 1 %
Lymphocytes Relative: 4 %
Lymphs Abs: 0.2 10*3/uL — ABNORMAL LOW (ref 0.7–4.0)
MCH: 31.7 pg (ref 26.0–34.0)
MCHC: 32.8 g/dL (ref 30.0–36.0)
MCV: 96.8 fL (ref 80.0–100.0)
Monocytes Absolute: 0 10*3/uL — ABNORMAL LOW (ref 0.1–1.0)
Monocytes Relative: 1 %
Neutro Abs: 6.3 10*3/uL (ref 1.7–7.7)
Neutrophils Relative %: 94 %
Platelet Count: 150 10*3/uL (ref 150–400)
RBC: 2.49 MIL/uL — ABNORMAL LOW (ref 4.22–5.81)
RDW: 19.5 % — ABNORMAL HIGH (ref 11.5–15.5)
WBC Count: 6.6 10*3/uL (ref 4.0–10.5)
nRBC: 0 % (ref 0.0–0.2)

## 2022-07-20 LAB — SAMPLE TO BLOOD BANK

## 2022-07-20 MED ORDER — SODIUM CHLORIDE 0.9% FLUSH
10.0000 mL | Freq: Once | INTRAVENOUS | Status: AC
  Administered 2022-07-20: 10 mL

## 2022-07-20 MED ORDER — HEPARIN SOD (PORK) LOCK FLUSH 100 UNIT/ML IV SOLN
500.0000 [IU] | Freq: Once | INTRAVENOUS | Status: AC
  Administered 2022-07-20: 500 [IU]

## 2022-07-22 ENCOUNTER — Other Ambulatory Visit: Payer: Self-pay

## 2022-07-22 DIAGNOSIS — D6481 Anemia due to antineoplastic chemotherapy: Secondary | ICD-10-CM

## 2022-07-23 ENCOUNTER — Inpatient Hospital Stay: Payer: Medicare Other

## 2022-07-23 ENCOUNTER — Other Ambulatory Visit: Payer: Self-pay | Admitting: Physician Assistant

## 2022-07-23 DIAGNOSIS — Z5111 Encounter for antineoplastic chemotherapy: Secondary | ICD-10-CM | POA: Diagnosis not present

## 2022-07-23 DIAGNOSIS — D6481 Anemia due to antineoplastic chemotherapy: Secondary | ICD-10-CM

## 2022-07-23 DIAGNOSIS — Z95828 Presence of other vascular implants and grafts: Secondary | ICD-10-CM

## 2022-07-23 DIAGNOSIS — C3412 Malignant neoplasm of upper lobe, left bronchus or lung: Secondary | ICD-10-CM

## 2022-07-23 LAB — CBC WITH DIFFERENTIAL (CANCER CENTER ONLY)
Abs Immature Granulocytes: 0.04 10*3/uL (ref 0.00–0.07)
Basophils Absolute: 0 10*3/uL (ref 0.0–0.1)
Basophils Relative: 0 %
Eosinophils Absolute: 0 10*3/uL (ref 0.0–0.5)
Eosinophils Relative: 0 %
HCT: 21.6 % — ABNORMAL LOW (ref 39.0–52.0)
Hemoglobin: 7 g/dL — ABNORMAL LOW (ref 13.0–17.0)
Immature Granulocytes: 1 %
Lymphocytes Relative: 3 %
Lymphs Abs: 0.1 10*3/uL — ABNORMAL LOW (ref 0.7–4.0)
MCH: 31.8 pg (ref 26.0–34.0)
MCHC: 32.4 g/dL (ref 30.0–36.0)
MCV: 98.2 fL (ref 80.0–100.0)
Monocytes Absolute: 0.1 10*3/uL (ref 0.1–1.0)
Monocytes Relative: 3 %
Neutro Abs: 4.9 10*3/uL (ref 1.7–7.7)
Neutrophils Relative %: 93 %
Platelet Count: 93 10*3/uL — ABNORMAL LOW (ref 150–400)
RBC: 2.2 MIL/uL — ABNORMAL LOW (ref 4.22–5.81)
RDW: 18.7 % — ABNORMAL HIGH (ref 11.5–15.5)
WBC Count: 5.2 10*3/uL (ref 4.0–10.5)
nRBC: 0 % (ref 0.0–0.2)

## 2022-07-23 LAB — PREPARE RBC (CROSSMATCH)

## 2022-07-23 MED ORDER — SODIUM CHLORIDE 0.9% FLUSH
10.0000 mL | Freq: Once | INTRAVENOUS | Status: AC
  Administered 2022-07-23: 10 mL

## 2022-07-23 MED ORDER — HEPARIN SOD (PORK) LOCK FLUSH 100 UNIT/ML IV SOLN
500.0000 [IU] | Freq: Once | INTRAVENOUS | Status: AC
  Administered 2022-07-23: 500 [IU]

## 2022-07-23 NOTE — Addendum Note (Signed)
Addended by: Scot Dock on: 07/23/2022 12:29 PM   Modules accepted: Orders

## 2022-07-24 ENCOUNTER — Other Ambulatory Visit: Payer: Self-pay

## 2022-07-24 ENCOUNTER — Inpatient Hospital Stay: Payer: Medicare Other

## 2022-07-24 DIAGNOSIS — Z5111 Encounter for antineoplastic chemotherapy: Secondary | ICD-10-CM | POA: Diagnosis not present

## 2022-07-24 DIAGNOSIS — D6481 Anemia due to antineoplastic chemotherapy: Secondary | ICD-10-CM

## 2022-07-24 MED ORDER — HEPARIN SOD (PORK) LOCK FLUSH 100 UNIT/ML IV SOLN
250.0000 [IU] | INTRAVENOUS | Status: AC | PRN
  Administered 2022-07-24: 250 [IU]

## 2022-07-24 MED ORDER — DIPHENHYDRAMINE HCL 25 MG PO CAPS
25.0000 mg | ORAL_CAPSULE | Freq: Once | ORAL | Status: AC
  Administered 2022-07-24: 25 mg via ORAL
  Filled 2022-07-24: qty 1

## 2022-07-24 MED ORDER — SODIUM CHLORIDE 0.9% FLUSH
3.0000 mL | INTRAVENOUS | Status: AC | PRN
  Administered 2022-07-24: 3 mL

## 2022-07-24 MED ORDER — ACETAMINOPHEN 325 MG PO TABS
650.0000 mg | ORAL_TABLET | Freq: Once | ORAL | Status: AC
  Administered 2022-07-24: 650 mg via ORAL
  Filled 2022-07-24: qty 2

## 2022-07-24 MED ORDER — SODIUM CHLORIDE 0.9% IV SOLUTION
250.0000 mL | Freq: Once | INTRAVENOUS | Status: AC
  Administered 2022-07-24: 250 mL via INTRAVENOUS

## 2022-07-24 NOTE — Patient Instructions (Signed)
Blood Transfusion, Adult, Care After ?This sheet gives you information about how to care for yourself after your procedure. Your health care provider may also give you more specific instructions. If you have problems or questions, contact your health care provider. ?What can I expect after the procedure? ?After the procedure, it is common to have: ?Bruising and soreness where the IV was inserted. ?A headache. ?Follow these instructions at home: ?IV insertion site care ? ?  ? ?Follow instructions from your health care provider about how to take care of your IV insertion site. Make sure you: ?Wash your hands with soap and water before and after you change your bandage (dressing). If soap and water are not available, use hand sanitizer. ?Change your dressing as told by your health care provider. ?Check your IV insertion site every day for signs of infection. Check for: ?Redness, swelling, or pain. ?Bleeding from the site. ?Warmth. ?Pus or a bad smell. ?General instructions ?Take over-the-counter and prescription medicines only as told by your health care provider. ?Rest as told by your health care provider. ?Return to your normal activities as told by your health care provider. ?Keep all follow-up visits as told by your health care provider. This is important. ?Contact a health care provider if: ?You have itching or red, swollen areas of skin (hives). ?You feel anxious. ?You feel weak after doing your normal activities. ?You have redness, swelling, warmth, or pain around the IV insertion site. ?You have blood coming from the IV insertion site that does not stop with pressure. ?You have pus or a bad smell coming from your IV insertion site. ?Get help right away if: ?You have symptoms of a serious allergic or immune system reaction, including: ?Trouble breathing or shortness of breath. ?Swelling of the face or feeling flushed. ?Fever or chills. ?Pain in the head, back, or chest. ?Dark urine or blood in the  urine. ?Widespread rash. ?Fast heartbeat. ?Feeling dizzy or light-headed. ?If you receive your blood transfusion in an outpatient setting, you will be told whom to contact to report any reactions. ?These symptoms may represent a serious problem that is an emergency. Do not wait to see if the symptoms will go away. Get medical help right away. Call your local emergency services (911 in the [place] Do not drive yourself to the hospital. ?Summary ?Bruising and tenderness around the IV insertion site are common. ?Check your IV insertion site every day for signs of infection. ?Rest as told by your health care provider. Return to your normal activities as told by your health care provider. ?Get help right away for symptoms of a serious allergic or immune system reaction to blood transfusion. ?This information is not intended to replace advice given to you by your health care provider. Make sure you discuss any questions you have with your health care provider. ?Document Revised: 04/10/2021 Document Reviewed: 06/08/2019 ?Elsevier Patient Education ? 2023 Elsevier Inc. ? ?

## 2022-07-27 ENCOUNTER — Other Ambulatory Visit: Payer: Medicare Other

## 2022-07-27 ENCOUNTER — Inpatient Hospital Stay: Payer: Medicare Other

## 2022-07-27 ENCOUNTER — Other Ambulatory Visit: Payer: Self-pay

## 2022-07-27 DIAGNOSIS — C3412 Malignant neoplasm of upper lobe, left bronchus or lung: Secondary | ICD-10-CM

## 2022-07-27 DIAGNOSIS — Z5111 Encounter for antineoplastic chemotherapy: Secondary | ICD-10-CM | POA: Diagnosis not present

## 2022-07-27 DIAGNOSIS — Z95828 Presence of other vascular implants and grafts: Secondary | ICD-10-CM

## 2022-07-27 LAB — CBC WITH DIFFERENTIAL (CANCER CENTER ONLY)
Abs Immature Granulocytes: 0.01 10*3/uL (ref 0.00–0.07)
Basophils Absolute: 0 10*3/uL (ref 0.0–0.1)
Basophils Relative: 0 %
Eosinophils Absolute: 0 10*3/uL (ref 0.0–0.5)
Eosinophils Relative: 0 %
HCT: 30.1 % — ABNORMAL LOW (ref 39.0–52.0)
Hemoglobin: 10 g/dL — ABNORMAL LOW (ref 13.0–17.0)
Immature Granulocytes: 1 %
Lymphocytes Relative: 8 %
Lymphs Abs: 0.1 10*3/uL — ABNORMAL LOW (ref 0.7–4.0)
MCH: 31.3 pg (ref 26.0–34.0)
MCHC: 33.2 g/dL (ref 30.0–36.0)
MCV: 94.4 fL (ref 80.0–100.0)
Monocytes Absolute: 0.2 10*3/uL (ref 0.1–1.0)
Monocytes Relative: 10 %
Neutro Abs: 1.4 10*3/uL — ABNORMAL LOW (ref 1.7–7.7)
Neutrophils Relative %: 81 %
Platelet Count: 91 10*3/uL — ABNORMAL LOW (ref 150–400)
RBC: 3.19 MIL/uL — ABNORMAL LOW (ref 4.22–5.81)
RDW: 16.9 % — ABNORMAL HIGH (ref 11.5–15.5)
WBC Count: 1.7 10*3/uL — ABNORMAL LOW (ref 4.0–10.5)
nRBC: 0 % (ref 0.0–0.2)

## 2022-07-27 LAB — BPAM RBC
Blood Product Expiration Date: 202308272359
Blood Product Expiration Date: 202308272359
ISSUE DATE / TIME: 202307280758
ISSUE DATE / TIME: 202307280758
Unit Type and Rh: 5100
Unit Type and Rh: 5100

## 2022-07-27 LAB — TYPE AND SCREEN
ABO/RH(D): O POS
Antibody Screen: NEGATIVE
Unit division: 0
Unit division: 0

## 2022-07-27 LAB — CMP (CANCER CENTER ONLY)
ALT: 8 U/L (ref 0–44)
AST: 12 U/L — ABNORMAL LOW (ref 15–41)
Albumin: 3.9 g/dL (ref 3.5–5.0)
Alkaline Phosphatase: 61 U/L (ref 38–126)
Anion gap: 5 (ref 5–15)
BUN: 13 mg/dL (ref 8–23)
CO2: 29 mmol/L (ref 22–32)
Calcium: 9 mg/dL (ref 8.9–10.3)
Chloride: 101 mmol/L (ref 98–111)
Creatinine: 0.45 mg/dL — ABNORMAL LOW (ref 0.61–1.24)
GFR, Estimated: >60 mL/min (ref >60)
Glucose, Bld: 116 mg/dL — ABNORMAL HIGH (ref 70–99)
Potassium: 4.5 mmol/L (ref 3.5–5.1)
Sodium: 135 mmol/L (ref 135–145)
Total Bilirubin: 0.5 mg/dL (ref 0.3–1.2)
Total Protein: 6.8 g/dL (ref 6.5–8.1)

## 2022-07-27 LAB — MAGNESIUM: Magnesium: 1.9 mg/dL (ref 1.7–2.4)

## 2022-07-27 MED ORDER — HEPARIN SOD (PORK) LOCK FLUSH 100 UNIT/ML IV SOLN
500.0000 [IU] | Freq: Once | INTRAVENOUS | Status: AC
  Administered 2022-07-27: 500 [IU]

## 2022-07-27 MED ORDER — SODIUM CHLORIDE 0.9% FLUSH
10.0000 mL | Freq: Once | INTRAVENOUS | Status: AC
  Administered 2022-07-27: 10 mL

## 2022-08-03 ENCOUNTER — Inpatient Hospital Stay: Payer: Medicare Other | Attending: Internal Medicine

## 2022-08-03 ENCOUNTER — Other Ambulatory Visit: Payer: Medicare Other

## 2022-08-03 DIAGNOSIS — I10 Essential (primary) hypertension: Secondary | ICD-10-CM | POA: Insufficient documentation

## 2022-08-03 DIAGNOSIS — Z7952 Long term (current) use of systemic steroids: Secondary | ICD-10-CM | POA: Diagnosis not present

## 2022-08-03 DIAGNOSIS — Z79899 Other long term (current) drug therapy: Secondary | ICD-10-CM | POA: Diagnosis not present

## 2022-08-03 DIAGNOSIS — R059 Cough, unspecified: Secondary | ICD-10-CM | POA: Insufficient documentation

## 2022-08-03 DIAGNOSIS — R0600 Dyspnea, unspecified: Secondary | ICD-10-CM | POA: Insufficient documentation

## 2022-08-03 DIAGNOSIS — C3411 Malignant neoplasm of upper lobe, right bronchus or lung: Secondary | ICD-10-CM | POA: Insufficient documentation

## 2022-08-03 DIAGNOSIS — Z95828 Presence of other vascular implants and grafts: Secondary | ICD-10-CM

## 2022-08-03 DIAGNOSIS — F17210 Nicotine dependence, cigarettes, uncomplicated: Secondary | ICD-10-CM | POA: Diagnosis not present

## 2022-08-03 DIAGNOSIS — C3412 Malignant neoplasm of upper lobe, left bronchus or lung: Secondary | ICD-10-CM | POA: Diagnosis present

## 2022-08-03 DIAGNOSIS — R5383 Other fatigue: Secondary | ICD-10-CM | POA: Insufficient documentation

## 2022-08-03 LAB — CMP (CANCER CENTER ONLY)
ALT: 11 U/L (ref 0–44)
AST: 11 U/L — ABNORMAL LOW (ref 15–41)
Albumin: 4 g/dL (ref 3.5–5.0)
Alkaline Phosphatase: 69 U/L (ref 38–126)
Anion gap: 4 — ABNORMAL LOW (ref 5–15)
BUN: 10 mg/dL (ref 8–23)
CO2: 32 mmol/L (ref 22–32)
Calcium: 9.2 mg/dL (ref 8.9–10.3)
Chloride: 100 mmol/L (ref 98–111)
Creatinine: 0.48 mg/dL — ABNORMAL LOW (ref 0.61–1.24)
GFR, Estimated: >60 mL/min (ref >60)
Glucose, Bld: 132 mg/dL — ABNORMAL HIGH (ref 70–99)
Potassium: 4.3 mmol/L (ref 3.5–5.1)
Sodium: 136 mmol/L (ref 135–145)
Total Bilirubin: 0.3 mg/dL (ref 0.3–1.2)
Total Protein: 7 g/dL (ref 6.5–8.1)

## 2022-08-03 LAB — CBC WITH DIFFERENTIAL (CANCER CENTER ONLY)
Abs Immature Granulocytes: 0.12 10*3/uL — ABNORMAL HIGH (ref 0.00–0.07)
Basophils Absolute: 0 10*3/uL (ref 0.0–0.1)
Basophils Relative: 0 %
Eosinophils Absolute: 0 10*3/uL (ref 0.0–0.5)
Eosinophils Relative: 0 %
HCT: 33.3 % — ABNORMAL LOW (ref 39.0–52.0)
Hemoglobin: 11 g/dL — ABNORMAL LOW (ref 13.0–17.0)
Immature Granulocytes: 4 %
Lymphocytes Relative: 7 %
Lymphs Abs: 0.2 10*3/uL — ABNORMAL LOW (ref 0.7–4.0)
MCH: 32.1 pg (ref 26.0–34.0)
MCHC: 33 g/dL (ref 30.0–36.0)
MCV: 97.1 fL (ref 80.0–100.0)
Monocytes Absolute: 0.3 10*3/uL (ref 0.1–1.0)
Monocytes Relative: 11 %
Neutro Abs: 2.3 10*3/uL (ref 1.7–7.7)
Neutrophils Relative %: 78 %
Platelet Count: 247 10*3/uL (ref 150–400)
RBC: 3.43 MIL/uL — ABNORMAL LOW (ref 4.22–5.81)
RDW: 17.8 % — ABNORMAL HIGH (ref 11.5–15.5)
WBC Count: 3 10*3/uL — ABNORMAL LOW (ref 4.0–10.5)
nRBC: 0 % (ref 0.0–0.2)

## 2022-08-03 LAB — MAGNESIUM: Magnesium: 1.9 mg/dL (ref 1.7–2.4)

## 2022-08-03 MED ORDER — HEPARIN SOD (PORK) LOCK FLUSH 100 UNIT/ML IV SOLN
500.0000 [IU] | Freq: Once | INTRAVENOUS | Status: AC
  Administered 2022-08-03: 500 [IU]

## 2022-08-03 MED ORDER — SODIUM CHLORIDE 0.9% FLUSH
10.0000 mL | Freq: Once | INTRAVENOUS | Status: AC
  Administered 2022-08-03: 10 mL

## 2022-08-10 ENCOUNTER — Other Ambulatory Visit: Payer: Self-pay

## 2022-08-10 ENCOUNTER — Inpatient Hospital Stay: Payer: Medicare Other

## 2022-08-10 ENCOUNTER — Ambulatory Visit (HOSPITAL_COMMUNITY)
Admission: RE | Admit: 2022-08-10 | Discharge: 2022-08-10 | Disposition: A | Discharge status: Home / Self Care | Payer: Medicare Other | Source: Ambulatory Visit | Attending: Internal Medicine | Admitting: Internal Medicine

## 2022-08-10 ENCOUNTER — Other Ambulatory Visit: Payer: Medicare Other

## 2022-08-10 ENCOUNTER — Encounter (HOSPITAL_COMMUNITY): Payer: Self-pay

## 2022-08-10 DIAGNOSIS — C3490 Malignant neoplasm of unspecified part of unspecified bronchus or lung: Secondary | ICD-10-CM | POA: Insufficient documentation

## 2022-08-10 DIAGNOSIS — Z95828 Presence of other vascular implants and grafts: Secondary | ICD-10-CM

## 2022-08-10 DIAGNOSIS — C3412 Malignant neoplasm of upper lobe, left bronchus or lung: Secondary | ICD-10-CM

## 2022-08-10 LAB — CBC WITH DIFFERENTIAL (CANCER CENTER ONLY)
Abs Immature Granulocytes: 0.14 10*3/uL — ABNORMAL HIGH (ref 0.00–0.07)
Basophils Absolute: 0 10*3/uL (ref 0.0–0.1)
Basophils Relative: 0 %
Eosinophils Absolute: 0 10*3/uL (ref 0.0–0.5)
Eosinophils Relative: 0 %
HCT: 34.2 % — ABNORMAL LOW (ref 39.0–52.0)
Hemoglobin: 11.1 g/dL — ABNORMAL LOW (ref 13.0–17.0)
Immature Granulocytes: 2 %
Lymphocytes Relative: 10 %
Lymphs Abs: 0.8 10*3/uL (ref 0.7–4.0)
MCH: 31.6 pg (ref 26.0–34.0)
MCHC: 32.5 g/dL (ref 30.0–36.0)
MCV: 97.4 fL (ref 80.0–100.0)
Monocytes Absolute: 0.7 10*3/uL (ref 0.1–1.0)
Monocytes Relative: 9 %
Neutro Abs: 6.8 10*3/uL (ref 1.7–7.7)
Neutrophils Relative %: 79 %
Platelet Count: 256 10*3/uL (ref 150–400)
RBC: 3.51 MIL/uL — ABNORMAL LOW (ref 4.22–5.81)
RDW: 18.3 % — ABNORMAL HIGH (ref 11.5–15.5)
WBC Count: 8.4 10*3/uL (ref 4.0–10.5)
nRBC: 0 % (ref 0.0–0.2)

## 2022-08-10 LAB — CMP (CANCER CENTER ONLY)
ALT: 9 U/L (ref 0–44)
AST: 11 U/L — ABNORMAL LOW (ref 15–41)
Albumin: 4 g/dL (ref 3.5–5.0)
Alkaline Phosphatase: 62 U/L (ref 38–126)
Anion gap: 6 (ref 5–15)
BUN: 12 mg/dL (ref 8–23)
CO2: 30 mmol/L (ref 22–32)
Calcium: 8.9 mg/dL (ref 8.9–10.3)
Chloride: 101 mmol/L (ref 98–111)
Creatinine: 0.54 mg/dL — ABNORMAL LOW (ref 0.61–1.24)
GFR, Estimated: >60 mL/min (ref >60)
Glucose, Bld: 115 mg/dL — ABNORMAL HIGH (ref 70–99)
Potassium: 3.7 mmol/L (ref 3.5–5.1)
Sodium: 137 mmol/L (ref 135–145)
Total Bilirubin: 0.4 mg/dL (ref 0.3–1.2)
Total Protein: 6.6 g/dL (ref 6.5–8.1)

## 2022-08-10 MED ORDER — IOHEXOL 300 MG/ML  SOLN
100.0000 mL | Freq: Once | INTRAMUSCULAR | Status: AC | PRN
Start: 2022-08-10 — End: 2022-08-10
  Administered 2022-08-10: 100 mL via INTRAVENOUS

## 2022-08-10 MED ORDER — HEPARIN SOD (PORK) LOCK FLUSH 100 UNIT/ML IV SOLN
INTRAVENOUS | Status: AC
  Filled 2022-08-10: qty 5

## 2022-08-10 MED ORDER — SODIUM CHLORIDE 0.9% FLUSH
10.0000 mL | Freq: Once | INTRAVENOUS | Status: AC
  Administered 2022-08-10: 10 mL

## 2022-08-10 MED ORDER — SODIUM CHLORIDE (PF) 0.9 % IJ SOLN
INTRAMUSCULAR | Status: AC
  Filled 2022-08-10: qty 50

## 2022-08-10 MED ORDER — HEPARIN SOD (PORK) LOCK FLUSH 100 UNIT/ML IV SOLN
500.0000 [IU] | Freq: Once | INTRAVENOUS | Status: AC
  Administered 2022-08-10: 500 [IU] via INTRAVENOUS

## 2022-08-12 ENCOUNTER — Inpatient Hospital Stay: Payer: Medicare Other | Admitting: Internal Medicine

## 2022-08-12 ENCOUNTER — Encounter: Payer: Self-pay | Admitting: Internal Medicine

## 2022-08-12 ENCOUNTER — Ambulatory Visit: Payer: Medicare Other | Admitting: Internal Medicine

## 2022-08-12 ENCOUNTER — Inpatient Hospital Stay: Payer: Medicare Other | Admitting: Physician Assistant

## 2022-08-12 ENCOUNTER — Other Ambulatory Visit: Payer: Self-pay

## 2022-08-12 VITALS — BP 131/81 | HR 93 | Temp 97.9°F | Resp 18 | Wt 116.0 lb

## 2022-08-12 DIAGNOSIS — Z5111 Encounter for antineoplastic chemotherapy: Secondary | ICD-10-CM | POA: Diagnosis not present

## 2022-08-12 DIAGNOSIS — C3412 Malignant neoplasm of upper lobe, left bronchus or lung: Secondary | ICD-10-CM

## 2022-08-12 DIAGNOSIS — C3490 Malignant neoplasm of unspecified part of unspecified bronchus or lung: Secondary | ICD-10-CM

## 2022-08-12 NOTE — Progress Notes (Signed)
[facility] Telephone:(336) [phone]   Fax:(336) [phone]  OFFICE PROGRESS NOTE  [name], NP [place] [address]  DIAGNOSIS: Limited stage (T1c, N1, M1a) small cell lung cancer presented with left upper lobe lung nodule as well as left hilar lymphadenopathy as well as right upper lobe lung nodule diagnosed in February 2023  PRIOR THERAPY: Systemic chemotherapy with cisplatin 80 Mg/M2 on day 1 and 2 etoposide 100 Mg/M2 on days 1, 2 and 3 concurrent with radiotherapy.  First dose February 17, 2022.  Status post 3 cycles. Starting from cycle #2 his dose of cisplatin was reduced to 60 Mg/M2 on day 1 and etoposide reduced to 80 Mg/M2 on days 1, 2 and 3 every 3 weeks secondary to intolerance.  CURRENT THERAPY: None  INTERVAL HISTORY: Mark Yu 66 y.o. male returns to the clinic today for follow-up visit accompanied by his girlfriend.  The patient is feeling fine today with no concerning complaints except for the generalized fatigue and shortness of breath at baseline increased with exertion.  He is currently on prednisone 20 mg p.o. daily.  He has no chest pain but has some cough with no hemoptysis.  He has no nausea, vomiting, diarrhea or constipation.  He has no headache or visual changes.  He is here today for evaluation with repeat CT scan of the chest for restaging of his disease.   MEDICAL HISTORY: Past Medical History:  Diagnosis Date   Anxiety    tx xanax   COPD (chronic obstructive pulmonary disease) ([facility])    tx inhalers   Depression    Dyspnea    with exertion   HTN (hypertension)    tx amlodipine   Hyponatremia 03/08/2022    ALLERGIES:  is allergic to amoxicillin.  MEDICATIONS:  Current Outpatient Medications  Medication Sig Dispense Refill   albuterol (PROVENTIL) (2.5 MG/3ML) 0.083% nebulizer solution Take 2.5 mg by nebulization every 6 (six) hours as needed for wheezing or shortness of breath.     albuterol  (VENTOLIN HFA) 108 (90 Base) MCG/ACT inhaler Inhale 2 puffs into the lungs every 4 (four) hours as needed for wheezing or shortness of breath.     ALPRAZolam (XANAX) 1 MG tablet TAKE 1/2 TO 1 TABLET BY MOUTH TWO TIMES A DAY AS NEEDED FOR ANXIETY 30 tablet 0   amLODipine (NORVASC) 5 MG tablet Take 5 mg by mouth daily.     BREZTRI AEROSPHERE 160-9-4.8 MCG/ACT AERO Inhale 2 puffs into the lungs in the morning and at bedtime.     dextromethorphan-guaiFENesin (ROBITUSSIN-DM) 10-100 MG/5ML liquid Take 10 mLs by mouth 2 (two) times daily.     fluticasone (FLONASE) 50 MCG/ACT nasal spray Place 2 sprays into both nostrils daily as needed for allergies or rhinitis.     Nutritional Supplements (FEEDING SUPPLEMENT, OSMOLITE 1.5 CAL,) LIQD Place 1,000 mLs into feeding tube continuous.  0   pantoprazole (PROTONIX) 40 MG tablet Take 40 mg by mouth daily.     pantoprazole sodium (PROTONIX) 40 mg Place 40 mg into feeding tube daily. 30 packet 3   predniSONE (DELTASONE) 10 MG tablet Take 2 tablets (20 mg total) by mouth daily with breakfast. 180 tablet 0   Water For Irrigation, Sterile (FREE WATER) SOLN Place 150 mLs into feeding tube 6 (six) times daily. 30 mL 3   lidocaine-prilocaine (EMLA) cream Apply to the Port-A-Cath site 30-60 minutes before chemotherapy (Patient not taking: Reported on 06/11/2022) 30 g 0  prochlorperazine (COMPAZINE) 10 MG tablet Take 1 tablet (10 mg total) by mouth every 6 (six) hours as needed for nausea or vomiting. (Patient not taking: Reported on 06/23/2022) 30 tablet 0   No current facility-administered medications for this visit.    SURGICAL HISTORY:  Past Surgical History:  Procedure Laterality Date   BRONCHIAL BIOPSY  02/02/2022   Procedure: BRONCHIAL BIOPSIES;  Surgeon: Collene Gobble, MD;  Location: [facility] ENDOSCOPY;  Service: Pulmonary;;   BRONCHIAL BRUSHINGS  02/02/2022   Procedure: BRONCHIAL BRUSHINGS;  Surgeon: Collene Gobble, MD;  Location: [facility] ENDOSCOPY;  Service: Pulmonary;;    BRONCHIAL NEEDLE ASPIRATION BIOPSY  02/02/2022   Procedure: BRONCHIAL NEEDLE ASPIRATION BIOPSIES;  Surgeon: Collene Gobble, MD;  Location: [facility] ENDOSCOPY;  Service: Pulmonary;;   IR GASTROSTOMY TUBE MOD SED  04/03/2022   IR IMAGING GUIDED PORT INSERTION  02/24/2022   MRI     NO PAST SURGERIES     VIDEO BRONCHOSCOPY WITH ENDOBRONCHIAL ULTRASOUND N/A 02/02/2022   Procedure: VIDEO BRONCHOSCOPY WITH ENDOBRONCHIAL ULTRASOUND;  Surgeon: Collene Gobble, MD;  Location: [facility] ENDOSCOPY;  Service: Pulmonary;  Laterality: N/A;   VIDEO BRONCHOSCOPY WITH RADIAL ENDOBRONCHIAL ULTRASOUND  02/02/2022   Procedure: VIDEO BRONCHOSCOPY WITH RADIAL ENDOBRONCHIAL ULTRASOUND;  Surgeon: Collene Gobble, MD;  Location: [facility] ENDOSCOPY;  Service: Pulmonary;;    REVIEW OF SYSTEMS:  Constitutional: positive for fatigue Eyes: negative Ears, nose, mouth, throat, and face: negative Respiratory: positive for dyspnea on exertion Cardiovascular: negative Gastrointestinal: negative Genitourinary:negative Integument/breast: negative Hematologic/lymphatic: negative Musculoskeletal:negative Neurological: negative Behavioral/Psych: negative Endocrine: negative Allergic/Immunologic: negative   PHYSICAL EXAMINATION: General appearance: alert, cooperative, fatigued, and no distress Head: Normocephalic, without obvious abnormality, atraumatic Neck: no adenopathy, no JVD, supple, symmetrical, trachea midline, and thyroid not enlarged, symmetric, no tenderness/mass/nodules Lymph nodes: Cervical, supraclavicular, and axillary nodes normal. Resp: clear to auscultation bilaterally Back: symmetric, no curvature. ROM normal. No CVA tenderness. Cardio: regular rate and rhythm, S1, S2 normal, no murmur, click, rub or gallop GI: soft, non-tender; bowel sounds normal; no masses,  no organomegaly Extremities: extremities normal, atraumatic, no cyanosis or edema Neurologic: Alert and oriented X 3, normal strength and tone. Normal symmetric  reflexes. Normal coordination and gait  ECOG PERFORMANCE STATUS: 1 - Symptomatic but completely ambulatory  Blood pressure 131/81, pulse 93, temperature 97.9 F (36.6 C), temperature source Oral, resp. rate 18, weight 116 lb (52.6 kg), SpO2 97 %.  LABORATORY DATA: Lab Results  Component Value Date   WBC 8.4 08/10/2022   HGB 11.1 (L) 08/10/2022   HCT 34.2 (L) 08/10/2022   MCV 97.4 08/10/2022   PLT 256 08/10/2022      Chemistry      Component Value Date/Time   NA 137 08/10/2022 0816   K 3.7 08/10/2022 0816   CL 101 08/10/2022 0816   CO2 30 08/10/2022 0816   BUN 12 08/10/2022 0816   CREATININE 0.54 (L) 08/10/2022 0816      Component Value Date/Time   CALCIUM 8.9 08/10/2022 0816   ALKPHOS 62 08/10/2022 0816   AST 11 (L) 08/10/2022 0816   ALT 9 08/10/2022 0816   BILITOT 0.4 08/10/2022 0816       RADIOGRAPHIC STUDIES: CT Chest W Contrast  Result Date: 08/10/2022 CLINICAL DATA:  Small cell lung cancer.  * Tracking Code: BO * EXAM: CT CHEST WITH CONTRAST TECHNIQUE: Multidetector CT imaging of the chest was performed during intravenous contrast administration. RADIATION DOSE REDUCTION: This exam was performed according to the departmental dose-optimization program which includes  automated exposure control, adjustment of the mA and/or kV according to patient size and/or use of iterative reconstruction technique. CONTRAST:  153mL OMNIPAQUE IOHEXOL 300 MG/ML  SOLN COMPARISON:  06/02/2022 and PET 01/20/2022. FINDINGS: Cardiovascular: Right IJ Port-A-Cath terminates in the right atrium. Atherosclerotic calcification of the aorta and coronary arteries. Heart size normal. Left ventricle appears somewhat dilated. No pericardial effusion. Mediastinum/Nodes: No pathologically enlarged mediastinal, hilar or axillary lymph nodes. Esophagus is grossly unremarkable. Lungs/Pleura: Bullous paraseptal and centrilobular emphysema. Biapical pleuroparenchymal scarring. Nodular consolidation in the  anterior segment right upper lobe has decreased slightly in size, now measuring 1.4 x 2.2 cm (7/53), previously 2.2 x 2.7 cm. It is seen in association with a previously seen partially calcified non hypermetabolic nodule, better seen and measured on prior exams. Scarring in the medial aspects of both lower lobes. A few scattered millimetric pulmonary nodules measure 3 mm or less in size and are unchanged. No pleural fluid. Debris is seen in the airway. Upper Abdomen: Minimally hypodense lesions in left hepatic lobe, measure 8 mm (2/170) and 1.6 cm (2/174), respectively, new from 01/20/2022. 5 mm low-attenuation lesion in the dome of the left hepatic lobe (2/152), too small to characterize. Visualized portions of the adrenal glands, kidneys, spleen, pancreas, stomach and bowel are grossly unremarkable. Upper abdominal lymph nodes are not enlarged by CT size criteria. Musculoskeletal: Degenerative changes in the spine. Old left rib fractures. No worrisome lytic or sclerotic lesions. IMPRESSION: 1. Interval decrease in size of nodular consolidation in the right upper lobe, possibly due to a resolving infectious/inflammatory insult. Recommend continued attention on follow-up exams. Finding is seen in association with a partially calcified nodule, not shown to be hypermetabolic on previous PET and better seen on prior studies. 2. Hypodense lesions in the left hepatic lobe are new from PET 01/20/2022 and worrisome for metastases. 3. No recurrence at the site of the primary left upper lobe nodule, originally seen on PET 01/20/2022 . 4. Aortic atherosclerosis (ICD10-I70.0). Coronary artery calcification. 5.  Emphysema (ICD10-J43.9). Electronically Signed   By: Lorin Picket M.D.   On: 08/10/2022 14:39    ASSESSMENT AND PLAN: This is a very pleasant 66 years old white male recently diagnosed with small cell lung cancer likely extensive stage (T1c, N1, M1a) disease diagnosed and February 2023 with the bilateral nodules in  the right upper lobe as well as the left upper lobe but this could be also treated as limited stage disease because of the proximity of the lesions. The patient underwent a course of systemic chemotherapy with cisplatin 80 Mg/M2 on day 1 and etoposide 100 Mg/M2 on days 1, 2 and 3 every 3.  Status post 4 cycles.  The first cycle of his treatment well with no concerning adverse effect but unfortunately he was admitted 2 weeks later with respiratory failure and multifocal pneumonia as well as radiation pneumonitis.  The patient had prolonged hospitalization for treatment of his condition but he is finally at home after rehabilitation. Starting from cycle #2 his dose of cisplatin was reduced to 60 Mg/M2 on day 1 and 2 etoposide reduced to 80 Mg/M2 on days 1, 2 and 3 every 3 weeks. The patient is feeling fine today with no concerning complaints except for the baseline fatigue. He had repeat CT scan of the chest performed recently.  I personally and independently reviewed the scan images and discussed the results with the patient and his girlfriend. His scan showed significant improvement in the lung lesions but there is some  concerning hypodense lesion in the left hepatic lobe concerning for metastasis. I recommended for the patient to have MRI of the liver for further evaluation of this lesion.  If the MRI is concerning for metastatic disease to the liver, I will consider him for treatment with systemic therapy again. I will see him back for follow-up visit in 2 weeks for evaluation and more detailed discussion of his treatment options based on the imaging studies. The patient was advised to call immediately if he has any other concerning symptoms in the interval.  The patient voices understanding of current disease status and treatment options and is in agreement with the current care plan.  All questions were answered. The patient knows to call the clinic with any problems, questions or concerns. We can  certainly see the patient much sooner if necessary.   Disclaimer: This note was dictated with voice recognition software. Similar sounding words can inadvertently be transcribed and may not be corrected upon review.

## 2022-08-13 ENCOUNTER — Other Ambulatory Visit: Payer: Self-pay

## 2022-08-13 ENCOUNTER — Telehealth: Payer: Self-pay | Admitting: Internal Medicine

## 2022-08-13 NOTE — Telephone Encounter (Signed)
Contacted patient to scheduled appointments. Patient is aware of appointments that are scheduled.   

## 2022-08-14 ENCOUNTER — Other Ambulatory Visit: Payer: Self-pay

## 2022-08-24 ENCOUNTER — Ambulatory Visit (HOSPITAL_COMMUNITY)
Admission: RE | Admit: 2022-08-24 | Discharge: 2022-08-24 | Disposition: A | Discharge status: Home / Self Care | Payer: Medicare Other | Source: Ambulatory Visit | Attending: Internal Medicine | Admitting: Internal Medicine

## 2022-08-24 DIAGNOSIS — C3490 Malignant neoplasm of unspecified part of unspecified bronchus or lung: Secondary | ICD-10-CM | POA: Insufficient documentation

## 2022-08-24 MED ORDER — GADOBUTROL 1 MMOL/ML IV SOLN
5.0000 mL | Freq: Once | INTRAVENOUS | Status: AC | PRN
  Administered 2022-08-24: 5 mL via INTRAVENOUS

## 2022-08-27 ENCOUNTER — Other Ambulatory Visit: Payer: Self-pay

## 2022-08-27 ENCOUNTER — Inpatient Hospital Stay: Payer: Medicare Other | Admitting: Internal Medicine

## 2022-08-27 VITALS — BP 134/77 | HR 107 | Temp 98.1°F | Resp 15 | Wt 117.2 lb

## 2022-08-27 DIAGNOSIS — C3412 Malignant neoplasm of upper lobe, left bronchus or lung: Secondary | ICD-10-CM

## 2022-08-27 DIAGNOSIS — C3490 Malignant neoplasm of unspecified part of unspecified bronchus or lung: Secondary | ICD-10-CM

## 2022-08-27 NOTE — Progress Notes (Signed)
DISCONTINUE ON PATHWAY REGIMEN - Small Cell Lung     A cycle is every 21 days:     Etoposide      Cisplatin   **Always confirm dose/schedule in your pharmacy ordering system**  REASON: Disease Progression PRIOR TREATMENT: LOS15: Cisplatin 75 mg/m2 D1 + Etoposide 100 mg/m2 IV D1-3 q21 Days x 4 Cycles with Concurrent Radiation TREATMENT RESPONSE: Stable Disease (SD)  START ON PATHWAY REGIMEN - Small Cell Lung     A cycle is every 21 days:     Lurbinectedin   **Always confirm dose/schedule in your pharmacy ordering system**  Patient Characteristics: Relapsed or Progressive Disease, Second Line, Relapse ? 6 Months Therapeutic Status: Relapsed or Progressive Disease Line of Therapy: Second Line Time to Relapse: Relapse ? 6 Months Intent of Therapy: Non-Curative / Palliative Intent, Discussed with Patient

## 2022-08-27 NOTE — Progress Notes (Signed)
[place] [phone]   [phone]  OFFICE PROGRESS NOTE  [name], NP [place] [address]  DIAGNOSIS: Limited stage (T1c, N1, M1a) small cell lung cancer presented with left upper lobe lung nodule as well as left hilar lymphadenopathy as well as right upper lobe lung nodule diagnosed in February 2023  PRIOR THERAPY: Systemic chemotherapy with cisplatin 80 Mg/M2 on day 1 and 2 etoposide 100 Mg/M2 on days 1, 2 and 3 concurrent with radiotherapy.  First dose February 17, 2022.  Status post 4 cycles. Starting from cycle #2 his dose of cisplatin was reduced to 60 Mg/M2 on day 1 and etoposide reduced to 80 Mg/M2 on days 1, 2 and 3 every 3 weeks secondary to intolerance.  Last dose was giving July 14, 2022 discontinued after disease progression.  CURRENT THERAPY: Zepzelca (lurbinectedin) 2.6 Mg/M2 every 3 weeks.  First dose September 07, 2022.  INTERVAL HISTORY: Mark Yu 66 y.o. male returns to the clinic today for follow-up visit.  The patient is feeling fine today with no concerning complaints except for the generalized fatigue and weakness as well as the persistent dysphagia and he continues to have supplemental nutrition via the PEG tube.  He denied having any current chest pain but has shortness of breath with exertion with mild cough productive of thick sputum.  He has no hemoptysis.  He denied having any headache but had some flickering in his eyes and he was seen by an ophthalmologist recently.  He denied having any nausea, vomiting, diarrhea or constipation.  He denied having any fever or chills.  He was found on recent CT scan of the chest to have suspicious disease progression in the liver.  I ordered MRI of the liver and the patient is here today for evaluation and discussion of his imaging studies and treatment options.   MEDICAL HISTORY: Past Medical History:  Diagnosis Date   Anxiety    tx xanax   COPD  (chronic obstructive pulmonary disease) ([place])    tx inhalers   Depression    Dyspnea    with exertion   HTN (hypertension)    tx amlodipine   Hyponatremia 03/08/2022    ALLERGIES:  is allergic to amoxicillin.  MEDICATIONS:  Current Outpatient Medications  Medication Sig Dispense Refill   albuterol (PROVENTIL) (2.5 MG/3ML) 0.083% nebulizer solution Take 2.5 mg by nebulization every 6 (six) hours as needed for wheezing or shortness of breath.     albuterol (VENTOLIN HFA) 108 (90 Base) MCG/ACT inhaler Inhale 2 puffs into the lungs every 4 (four) hours as needed for wheezing or shortness of breath.     ALPRAZolam (XANAX) 1 MG tablet TAKE 1/2 TO 1 TABLET BY MOUTH TWO TIMES A DAY AS NEEDED FOR ANXIETY 30 tablet 0   amLODipine (NORVASC) 5 MG tablet Take 5 mg by mouth daily.     BREZTRI AEROSPHERE 160-9-4.8 MCG/ACT AERO Inhale 2 puffs into the lungs in the morning and at bedtime.     dextromethorphan-guaiFENesin (ROBITUSSIN-DM) 10-100 MG/5ML liquid Take 10 mLs by mouth 2 (two) times daily.     fluticasone (FLONASE) 50 MCG/ACT nasal spray Place 2 sprays into both nostrils daily as needed for allergies or rhinitis.     lidocaine-prilocaine (EMLA) cream Apply to the Port-A-Cath site 30-60 minutes before chemotherapy (Patient not taking: Reported on 06/11/2022) 30 g 0   Nutritional Supplements (FEEDING SUPPLEMENT, OSMOLITE 1.5 CAL,) LIQD Place 1,000 mLs into feeding tube  continuous.  0   pantoprazole (PROTONIX) 40 MG tablet Take 40 mg by mouth daily.     pantoprazole sodium (PROTONIX) 40 mg Place 40 mg into feeding tube daily. 30 packet 3   predniSONE (DELTASONE) 10 MG tablet Take 2 tablets (20 mg total) by mouth daily with breakfast. 180 tablet 0   prochlorperazine (COMPAZINE) 10 MG tablet Take 1 tablet (10 mg total) by mouth every 6 (six) hours as needed for nausea or vomiting. (Patient not taking: Reported on 06/23/2022) 30 tablet 0   Water For Irrigation, Sterile (FREE WATER) SOLN Place 150 mLs into  feeding tube 6 (six) times daily. 30 mL 3   No current facility-administered medications for this visit.    SURGICAL HISTORY:  Past Surgical History:  Procedure Laterality Date   BRONCHIAL BIOPSY  02/02/2022   Procedure: BRONCHIAL BIOPSIES;  Surgeon: Collene Gobble, MD;  Location: [facility] ENDOSCOPY;  Service: Pulmonary;;   BRONCHIAL BRUSHINGS  02/02/2022   Procedure: BRONCHIAL BRUSHINGS;  Surgeon: Collene Gobble, MD;  Location: [facility] ENDOSCOPY;  Service: Pulmonary;;   BRONCHIAL NEEDLE ASPIRATION BIOPSY  02/02/2022   Procedure: BRONCHIAL NEEDLE ASPIRATION BIOPSIES;  Surgeon: Collene Gobble, MD;  Location: [facility] ENDOSCOPY;  Service: Pulmonary;;   IR GASTROSTOMY TUBE MOD SED  04/03/2022   IR IMAGING GUIDED PORT INSERTION  02/24/2022   MRI     NO PAST SURGERIES     VIDEO BRONCHOSCOPY WITH ENDOBRONCHIAL ULTRASOUND N/A 02/02/2022   Procedure: VIDEO BRONCHOSCOPY WITH ENDOBRONCHIAL ULTRASOUND;  Surgeon: Collene Gobble, MD;  Location: [facility] ENDOSCOPY;  Service: Pulmonary;  Laterality: N/A;   VIDEO BRONCHOSCOPY WITH RADIAL ENDOBRONCHIAL ULTRASOUND  02/02/2022   Procedure: VIDEO BRONCHOSCOPY WITH RADIAL ENDOBRONCHIAL ULTRASOUND;  Surgeon: Collene Gobble, MD;  Location: [facility] ENDOSCOPY;  Service: Pulmonary;;    REVIEW OF SYSTEMS:  Constitutional: positive for fatigue Eyes: negative Ears, nose, mouth, throat, and face: negative Respiratory: positive for cough and dyspnea on exertion Cardiovascular: negative Gastrointestinal: negative Genitourinary:negative Integument/breast: negative Hematologic/lymphatic: negative Musculoskeletal:positive for muscle weakness Neurological: negative Behavioral/Psych: negative Endocrine: negative Allergic/Immunologic: negative   PHYSICAL EXAMINATION: General appearance: alert, cooperative, fatigued, and no distress Head: Normocephalic, without obvious abnormality, atraumatic Neck: no adenopathy, no JVD, supple, symmetrical, trachea midline, and thyroid not enlarged, symmetric, no  tenderness/mass/nodules Lymph nodes: Cervical, supraclavicular, and axillary nodes normal. Resp: clear to auscultation bilaterally Back: symmetric, no curvature. ROM normal. No CVA tenderness. Cardio: regular rate and rhythm, S1, S2 normal, no murmur, click, rub or gallop GI: soft, non-tender; bowel sounds normal; no masses,  no organomegaly Extremities: extremities normal, atraumatic, no cyanosis or edema Neurologic: Alert and oriented X 3, normal strength and tone. Normal symmetric reflexes. Normal coordination and gait  ECOG PERFORMANCE STATUS: 1 - Symptomatic but completely ambulatory  Blood pressure 134/77, pulse (!) 107, temperature 98.1 F (36.7 C), temperature source Oral, resp. rate 15, weight 117 lb 3.2 oz (53.2 kg), SpO2 97 %.  LABORATORY DATA: Lab Results  Component Value Date   WBC 8.4 08/10/2022   HGB 11.1 (L) 08/10/2022   HCT 34.2 (L) 08/10/2022   MCV 97.4 08/10/2022   PLT 256 08/10/2022      Chemistry      Component Value Date/Time   NA 137 08/10/2022 0816   K 3.7 08/10/2022 0816   CL 101 08/10/2022 0816   CO2 30 08/10/2022 0816   BUN 12 08/10/2022 0816   CREATININE 0.54 (L) 08/10/2022 0816      Component Value Date/Time   CALCIUM 8.9 08/10/2022  0816   ALKPHOS 62 08/10/2022 0816   AST 11 (L) 08/10/2022 0816   ALT 9 08/10/2022 0816   BILITOT 0.4 08/10/2022 0816       RADIOGRAPHIC STUDIES: MR LIVER W WO CONTRAST  Result Date: 08/24/2022 CLINICAL DATA:  Lung cancer, staging. EXAM: MRI ABDOMEN WITHOUT AND WITH CONTRAST TECHNIQUE: Multiplanar multisequence MR imaging of the abdomen was performed both before and after the administration of intravenous contrast. CONTRAST:  94mL GADAVIST GADOBUTROL 1 MMOL/ML IV SOLN COMPARISON:  Chest CT August 10, 2022 and PET-CT January 20, 2022 FINDINGS: Lower chest: No acute abnormality. Hepatobiliary: No hepatic steatosis. Bilobar hepatic lesions demonstrate intermediate T2 signal with intrinsic T1 hypointensity, reduced  diffusivity and peripheral enhancement consistent with hepatic metastasis. Indexed lesions are as follows: -segment III hepatic lesion measures 18 x 14 mm on image 14/32. -segment V hepatic lesion measures 11 x 10 mm on image 25/32. Scattered bilobar benign hepatic cysts. Pancreas: Intrinsic T1 signal of the pancreatic parenchyma is within normal limits. Pancreatic divisum. No pancreatic ductal dilation or evidence of acute inflammation. Spleen:  No splenomegaly. Adrenals/Urinary Tract: Bilateral adrenal glands appear normal. No suspicious renal mass. Stomach/Bowel: Percutaneous gastrostomy tube with retention balloon in the stomach. No pathologic dilation or evidence of acute inflammation involving loops of large or small bowel in the abdomen. Vascular/Lymphatic: Normal caliber abdominal aorta. Portal, splenic and superior mesenteric veins are patent. No pathologically enlarged abdominal lymph nodes Other:  None. Musculoskeletal: No suspicious bone lesions identified. IMPRESSION: Bilobar hepatic lesion demonstrate imaging characteristics consistent with metastases. Electronically Signed   By: Dahlia Bailiff M.D.   On: 08/24/2022 11:59   CT Chest W Contrast  Result Date: 08/10/2022 CLINICAL DATA:  Small cell lung cancer.  * Tracking Code: BO * EXAM: CT CHEST WITH CONTRAST TECHNIQUE: Multidetector CT imaging of the chest was performed during intravenous contrast administration. RADIATION DOSE REDUCTION: This exam was performed according to the departmental dose-optimization program which includes automated exposure control, adjustment of the mA and/or kV according to patient size and/or use of iterative reconstruction technique. CONTRAST:  146mL OMNIPAQUE IOHEXOL 300 MG/ML  SOLN COMPARISON:  06/02/2022 and PET 01/20/2022. FINDINGS: Cardiovascular: Right IJ Port-A-Cath terminates in the right atrium. Atherosclerotic calcification of the aorta and coronary arteries. Heart size normal. Left ventricle appears  somewhat dilated. No pericardial effusion. Mediastinum/Nodes: No pathologically enlarged mediastinal, hilar or axillary lymph nodes. Esophagus is grossly unremarkable. Lungs/Pleura: Bullous paraseptal and centrilobular emphysema. Biapical pleuroparenchymal scarring. Nodular consolidation in the anterior segment right upper lobe has decreased slightly in size, now measuring 1.4 x 2.2 cm (7/53), previously 2.2 x 2.7 cm. It is seen in association with a previously seen partially calcified non hypermetabolic nodule, better seen and measured on prior exams. Scarring in the medial aspects of both lower lobes. A few scattered millimetric pulmonary nodules measure 3 mm or less in size and are unchanged. No pleural fluid. Debris is seen in the airway. Upper Abdomen: Minimally hypodense lesions in left hepatic lobe, measure 8 mm (2/170) and 1.6 cm (2/174), respectively, new from 01/20/2022. 5 mm low-attenuation lesion in the dome of the left hepatic lobe (2/152), too small to characterize. Visualized portions of the adrenal glands, kidneys, spleen, pancreas, stomach and bowel are grossly unremarkable. Upper abdominal lymph nodes are not enlarged by CT size criteria. Musculoskeletal: Degenerative changes in the spine. Old left rib fractures. No worrisome lytic or sclerotic lesions. IMPRESSION: 1. Interval decrease in size of nodular consolidation in the right upper lobe, possibly due to  a resolving infectious/inflammatory insult. Recommend continued attention on follow-up exams. Finding is seen in association with a partially calcified nodule, not shown to be hypermetabolic on previous PET and better seen on prior studies. 2. Hypodense lesions in the left hepatic lobe are new from PET 01/20/2022 and worrisome for metastases. 3. No recurrence at the site of the primary left upper lobe nodule, originally seen on PET 01/20/2022 . 4. Aortic atherosclerosis (ICD10-I70.0). Coronary artery calcification. 5.  Emphysema (ICD10-J43.9).  Electronically Signed   By: Lorin Picket M.D.   On: 08/10/2022 14:39    ASSESSMENT AND PLAN: This is a very pleasant 65 years old white male recently diagnosed with small cell lung cancer likely extensive stage (T1c, N1, M1a) disease diagnosed and February 2023 with the bilateral nodules in the right upper lobe as well as the left upper lobe but this could be also treated as limited stage disease because of the proximity of the lesions. The patient underwent a course of systemic chemotherapy with cisplatin 80 Mg/M2 on day 1 and etoposide 100 Mg/M2 on days 1, 2 and 3 every 3.  Status post 4 cycles.  The first cycle of his treatment well with no concerning adverse effect but unfortunately he was admitted 2 weeks later with respiratory failure and multifocal pneumonia as well as radiation pneumonitis.  The patient had prolonged hospitalization for treatment of his condition but he is finally at home after rehabilitation. Starting from cycle #2 his dose of cisplatin was reduced to 60 Mg/M2 on day 1 and 2 etoposide reduced to 80 Mg/M2 on days 1, 2 and 3 every 3 weeks. The patient is feeling fine today with no concerning complaints except for the baseline fatigue. His scan showed significant improvement in the lung lesions but there is some concerning hypodense lesion in the left hepatic lobe concerning for metastasis.  Bilobar hepatic lesions with imaging characteristic of liver metastasis. I had a lengthy discussion with the patient today about his current condition and treatment options. Unfortunately patient has relapsed disease in a very short period after the induction systemic chemotherapy. I discussed with him his treatment options including palliative care versus palliative second line treatment with Zepzelca (lurbinectedin) at a reduced dose of 2.6 Mg/M2 every 3 weeks. I discussed with the patient the adverse effect of this treatment including but not limited to alopecia, myelosuppression, nausea  and vomiting, peripheral neuropathy, liver or renal dysfunction.  He is interested in proceeding with the treatment and expected to start the first cycle of this treatment on September 07, 2022. For the visual changes and flickering in his eye, I will arrange for the patient to have repeat MRI of the brain to rule out brain metastasis. The patient understand his poor prognosis especially with the relapsed disease and short interval after the induction systemic chemotherapy. He will come back for follow-up visit with the first day of his treatment. He was advised to call immediately if he has any other concerning symptoms in the interval.  The patient voices understanding of current disease status and treatment options and is in agreement with the current care plan.  All questions were answered. The patient knows to call the clinic with any problems, questions or concerns. We can certainly see the patient much sooner if necessary.   Disclaimer: This note was dictated with voice recognition software. Similar sounding words can inadvertently be transcribed and may not be corrected upon review.

## 2022-08-28 ENCOUNTER — Other Ambulatory Visit: Payer: Self-pay

## 2022-08-29 ENCOUNTER — Other Ambulatory Visit: Payer: Self-pay

## 2022-09-01 ENCOUNTER — Telehealth: Payer: Self-pay

## 2022-09-01 ENCOUNTER — Telehealth: Payer: Self-pay | Admitting: Internal Medicine

## 2022-09-01 ENCOUNTER — Other Ambulatory Visit: Payer: Self-pay | Admitting: Emergency Medicine

## 2022-09-01 NOTE — Telephone Encounter (Signed)
Pt LVM wanting to know why he has been scheduled for pt education on 09/04/22.  I have called the pt back and advised that per his visit with Dr. Julien Nordmann on 08/27/22, he will be starting a new treatment, Zepzelca, and the education is regarding this new treatment for him. Pt expressed understanding of this information.

## 2022-09-01 NOTE — Telephone Encounter (Signed)
Scheduled per 08/31 los, patient has been called and notified of upcoming appointments.

## 2022-09-02 ENCOUNTER — Other Ambulatory Visit: Payer: Self-pay | Admitting: *Deleted

## 2022-09-03 ENCOUNTER — Other Ambulatory Visit: Payer: Self-pay

## 2022-09-03 ENCOUNTER — Telehealth: Payer: Self-pay

## 2022-09-03 NOTE — Telephone Encounter (Signed)
Spoke with Mark Yu and told him that he has already been to the chemotherapy class in on 02-16-22. Dr. Julien Nordmann concurred that he does not need to keep appointment for the class tomorrow. Pt verbalized understanding. Will cancel appointment for tomorrow.

## 2022-09-04 ENCOUNTER — Other Ambulatory Visit: Payer: Self-pay

## 2022-09-04 ENCOUNTER — Inpatient Hospital Stay: Payer: Medicare Other

## 2022-09-04 DIAGNOSIS — C3412 Malignant neoplasm of upper lobe, left bronchus or lung: Secondary | ICD-10-CM

## 2022-09-05 ENCOUNTER — Other Ambulatory Visit (HOSPITAL_COMMUNITY): Payer: Medicare Other

## 2022-09-06 ENCOUNTER — Other Ambulatory Visit: Payer: Self-pay

## 2022-09-07 ENCOUNTER — Ambulatory Visit (HOSPITAL_COMMUNITY)
Admission: RE | Admit: 2022-09-07 | Discharge: 2022-09-07 | Disposition: A | Discharge status: Home / Self Care | Payer: Medicare Other | Source: Ambulatory Visit | Attending: Internal Medicine | Admitting: Internal Medicine

## 2022-09-07 ENCOUNTER — Ambulatory Visit: Payer: Medicare Other

## 2022-09-07 DIAGNOSIS — C3490 Malignant neoplasm of unspecified part of unspecified bronchus or lung: Secondary | ICD-10-CM | POA: Insufficient documentation

## 2022-09-07 MED ORDER — GADOBUTROL 1 MMOL/ML IV SOLN
5.0000 mL | Freq: Once | INTRAVENOUS | Status: AC | PRN
Start: 2022-09-07 — End: 2022-09-07
  Administered 2022-09-07: 5 mL via INTRAVENOUS

## 2022-09-07 MED FILL — Dexamethasone Sodium Phosphate Inj 100 MG/10ML: INTRAMUSCULAR | Qty: 1 | Status: AC

## 2022-09-08 ENCOUNTER — Inpatient Hospital Stay: Payer: Medicare Other

## 2022-09-08 ENCOUNTER — Other Ambulatory Visit: Payer: Self-pay

## 2022-09-08 ENCOUNTER — Inpatient Hospital Stay: Payer: Medicare Other | Attending: Internal Medicine | Admitting: Physician Assistant

## 2022-09-08 ENCOUNTER — Ambulatory Visit: Payer: Medicare Other

## 2022-09-08 VITALS — BP 124/89 | HR 102 | Temp 97.9°F | Resp 16 | Wt 117.6 lb

## 2022-09-08 VITALS — BP 124/77 | HR 86 | Temp 98.2°F | Resp 18

## 2022-09-08 DIAGNOSIS — R5383 Other fatigue: Secondary | ICD-10-CM | POA: Insufficient documentation

## 2022-09-08 DIAGNOSIS — I10 Essential (primary) hypertension: Secondary | ICD-10-CM | POA: Diagnosis not present

## 2022-09-08 DIAGNOSIS — C3412 Malignant neoplasm of upper lobe, left bronchus or lung: Secondary | ICD-10-CM

## 2022-09-08 DIAGNOSIS — Z5111 Encounter for antineoplastic chemotherapy: Secondary | ICD-10-CM | POA: Insufficient documentation

## 2022-09-08 DIAGNOSIS — Z95828 Presence of other vascular implants and grafts: Secondary | ICD-10-CM

## 2022-09-08 DIAGNOSIS — C3411 Malignant neoplasm of upper lobe, right bronchus or lung: Secondary | ICD-10-CM | POA: Diagnosis present

## 2022-09-08 DIAGNOSIS — Z7952 Long term (current) use of systemic steroids: Secondary | ICD-10-CM | POA: Insufficient documentation

## 2022-09-08 DIAGNOSIS — D6481 Anemia due to antineoplastic chemotherapy: Secondary | ICD-10-CM

## 2022-09-08 DIAGNOSIS — Z79899 Other long term (current) drug therapy: Secondary | ICD-10-CM | POA: Diagnosis not present

## 2022-09-08 DIAGNOSIS — Z5189 Encounter for other specified aftercare: Secondary | ICD-10-CM | POA: Insufficient documentation

## 2022-09-08 DIAGNOSIS — C787 Secondary malignant neoplasm of liver and intrahepatic bile duct: Secondary | ICD-10-CM | POA: Insufficient documentation

## 2022-09-08 DIAGNOSIS — R059 Cough, unspecified: Secondary | ICD-10-CM | POA: Insufficient documentation

## 2022-09-08 DIAGNOSIS — R0600 Dyspnea, unspecified: Secondary | ICD-10-CM | POA: Diagnosis not present

## 2022-09-08 DIAGNOSIS — F17210 Nicotine dependence, cigarettes, uncomplicated: Secondary | ICD-10-CM | POA: Insufficient documentation

## 2022-09-08 LAB — CMP (CANCER CENTER ONLY)
ALT: 9 U/L (ref 0–44)
AST: 12 U/L — ABNORMAL LOW (ref 15–41)
Albumin: 4.1 g/dL (ref 3.5–5.0)
Alkaline Phosphatase: 54 U/L (ref 38–126)
Anion gap: 6 (ref 5–15)
BUN: 12 mg/dL (ref 8–23)
CO2: 30 mmol/L (ref 22–32)
Calcium: 9.4 mg/dL (ref 8.9–10.3)
Chloride: 102 mmol/L (ref 98–111)
Creatinine: 0.61 mg/dL (ref 0.61–1.24)
GFR, Estimated: >60 mL/min (ref >60)
Glucose, Bld: 162 mg/dL — ABNORMAL HIGH (ref 70–99)
Potassium: 3.5 mmol/L (ref 3.5–5.1)
Sodium: 138 mmol/L (ref 135–145)
Total Bilirubin: 0.5 mg/dL (ref 0.3–1.2)
Total Protein: 6.6 g/dL (ref 6.5–8.1)

## 2022-09-08 LAB — CBC WITH DIFFERENTIAL (CANCER CENTER ONLY)
Abs Immature Granulocytes: 0.05 10*3/uL (ref 0.00–0.07)
Basophils Absolute: 0 10*3/uL (ref 0.0–0.1)
Basophils Relative: 0 %
Eosinophils Absolute: 0 10*3/uL (ref 0.0–0.5)
Eosinophils Relative: 0 %
HCT: 37 % — ABNORMAL LOW (ref 39.0–52.0)
Hemoglobin: 12 g/dL — ABNORMAL LOW (ref 13.0–17.0)
Immature Granulocytes: 1 %
Lymphocytes Relative: 10 %
Lymphs Abs: 0.8 10*3/uL (ref 0.7–4.0)
MCH: 32.5 pg (ref 26.0–34.0)
MCHC: 32.4 g/dL (ref 30.0–36.0)
MCV: 100.3 fL — ABNORMAL HIGH (ref 80.0–100.0)
Monocytes Absolute: 0.8 10*3/uL (ref 0.1–1.0)
Monocytes Relative: 11 %
Neutro Abs: 6.2 10*3/uL (ref 1.7–7.7)
Neutrophils Relative %: 78 %
Platelet Count: 208 10*3/uL (ref 150–400)
RBC: 3.69 MIL/uL — ABNORMAL LOW (ref 4.22–5.81)
RDW: 17.9 % — ABNORMAL HIGH (ref 11.5–15.5)
WBC Count: 7.8 10*3/uL (ref 4.0–10.5)
nRBC: 0 % (ref 0.0–0.2)

## 2022-09-08 LAB — MAGNESIUM: Magnesium: 1.9 mg/dL (ref 1.7–2.4)

## 2022-09-08 MED ORDER — SODIUM CHLORIDE 0.9 % IV SOLN
2.4400 mg/m2 | Freq: Once | INTRAVENOUS | Status: AC
  Administered 2022-09-08: 4 mg via INTRAVENOUS
  Filled 2022-09-08: qty 8

## 2022-09-08 MED ORDER — SODIUM CHLORIDE 0.9 % IV SOLN: Freq: Once | INTRAVENOUS | Status: AC

## 2022-09-08 MED ORDER — PALONOSETRON HCL INJECTION 0.25 MG/5ML
0.2500 mg | Freq: Once | INTRAVENOUS | Status: AC
  Administered 2022-09-08: 0.25 mg via INTRAVENOUS
  Filled 2022-09-08: qty 5

## 2022-09-08 MED ORDER — SODIUM CHLORIDE 0.9% FLUSH
10.0000 mL | Freq: Once | INTRAVENOUS | Status: AC
  Administered 2022-09-08: 10 mL

## 2022-09-08 MED ORDER — HEPARIN SOD (PORK) LOCK FLUSH 100 UNIT/ML IV SOLN
500.0000 [IU] | Freq: Once | INTRAVENOUS | Status: AC | PRN
  Administered 2022-09-08: 500 [IU]

## 2022-09-08 MED ORDER — SODIUM CHLORIDE 0.9 % IV SOLN
10.0000 mg | Freq: Once | INTRAVENOUS | Status: AC
  Administered 2022-09-08: 10 mg via INTRAVENOUS
  Filled 2022-09-08: qty 10

## 2022-09-08 MED ORDER — HEPARIN SOD (PORK) LOCK FLUSH 100 UNIT/ML IV SOLN: 500.0000 [IU] | Freq: Once | INTRAVENOUS | Status: DC

## 2022-09-08 MED ORDER — SODIUM CHLORIDE 0.9% FLUSH
10.0000 mL | INTRAVENOUS | Status: DC | PRN
  Administered 2022-09-08: 10 mL

## 2022-09-08 NOTE — Progress Notes (Signed)
[place] [phone]   [phone]  OFFICE PROGRESS NOTE  [name], NP [place] [address]  DIAGNOSIS: Limited stage (T1c, N1, M1a) small cell lung cancer presented with left upper lobe lung nodule as well as left hilar lymphadenopathy as well as right upper lobe lung nodule diagnosed in February 2023  PRIOR THERAPY: Systemic chemotherapy with cisplatin 80 Mg/M2 on day 1 and 2 etoposide 100 Mg/M2 on days 1, 2 and 3 concurrent with radiotherapy.  First dose February 17, 2022.  Status post 4 cycles. Starting from cycle #2 his dose of cisplatin was reduced to 60 Mg/M2 on day 1 and etoposide reduced to 80 Mg/M2 on days 1, 2 and 3 every 3 weeks secondary to intolerance.  Last dose was giving July 14, 2022 discontinued after disease progression.  CURRENT THERAPY: Zepzelca (lurbinectedin) 2.6 Mg/M2 every 3 weeks.  First dose today.   INTERVAL HISTORY: Mark Yu 66 y.o. male returns to the clinic today for follow-up visit.  He is unaccompanied for this visit. He reports that his fatigue has increased some over the last few days. 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 He has a good appetite and is tolerating PO intake. He is currently supplementing his nutrition with tube feedings with 2 osmolytes cartons per day. He is trying to wean off tube feedings. He denies nausea, vomiting or abdominal pain. He has regular bowel movements without recurrent episodes of diarrhea or constipation. He does bruise easily but denies any signs of active bleeding. He reports mild shortness of breath with exertion. He continues to have a cough that presents sporadically and produces phlegm. He is currently taking mucinex and flonase with some improvement. He denies fevers, chills, sweats, chest pain or cough. He has no other complaints.   MEDICAL HISTORY: Past Medical History:  Diagnosis Date   Anxiety    tx xanax   COPD  (chronic obstructive pulmonary disease) ([place])    tx inhalers   Depression    Dyspnea    with exertion   HTN (hypertension)    tx amlodipine   Hyponatremia 03/08/2022    ALLERGIES:  is allergic to amoxicillin and macrolides and ketolides.  MEDICATIONS:  Current Outpatient Medications  Medication Sig Dispense Refill   albuterol (PROVENTIL) (2.5 MG/3ML) 0.083% nebulizer solution Take 2.5 mg by nebulization every 6 (six) hours as needed for wheezing or shortness of breath.     albuterol (VENTOLIN HFA) 108 (90 Base) MCG/ACT inhaler Inhale 2 puffs into the lungs every 4 (four) hours as needed for wheezing or shortness of breath.     ALPRAZolam (XANAX) 1 MG tablet TAKE 1/2 TO 1 TABLET BY MOUTH TWO TIMES A DAY AS NEEDED FOR ANXIETY 30 tablet 0   amLODipine (NORVASC) 5 MG tablet Take 5 mg by mouth daily.     BREZTRI AEROSPHERE 160-9-4.8 MCG/ACT AERO Inhale 2 puffs into the lungs in the morning and at bedtime.     dextromethorphan-guaiFENesin (ROBITUSSIN-DM) 10-100 MG/5ML liquid Take 10 mLs by mouth 2 (two) times daily.     fluticasone (FLONASE) 50 MCG/ACT nasal spray Place 2 sprays into both nostrils daily as needed for allergies or rhinitis.     lidocaine-prilocaine (EMLA) cream Apply to the Port-A-Cath site 30-60 minutes before chemotherapy (Patient not taking: Reported on 06/11/2022) 30 g 0   Nutritional Supplements (FEEDING SUPPLEMENT, OSMOLITE 1.5 CAL,) LIQD Place 1,000 mLs into feeding tube continuous.  0   pantoprazole (PROTONIX) 40 MG tablet Take 40 mg by mouth daily.     pantoprazole sodium (PROTONIX) 40 mg Place 40 mg into feeding tube daily. 30 packet 3   predniSONE (DELTASONE) 10 MG tablet TAKE TWO TABLETS BY MOUTH DAILY WITH BREAKFAST 180 tablet 0   prochlorperazine (COMPAZINE) 10 MG tablet Take 1 tablet (10 mg total) by mouth every 6 (six) hours as needed for nausea or vomiting. (Patient not taking: Reported on 06/23/2022) 30 tablet 0   Water For Irrigation, Sterile (FREE WATER) SOLN  Place 150 mLs into feeding tube 6 (six) times daily. 30 mL 3   No current facility-administered medications for this visit.   Facility-Administered Medications Ordered in Other Visits  Medication Dose Route Frequency Provider Last Rate Last Admin   sodium chloride flush (NS) 0.9 % injection 10 mL  10 mL Intracatheter PRN Curt Bears, MD   10 mL at 09/08/22 1149    SURGICAL HISTORY:  Past Surgical History:  Procedure Laterality Date   BRONCHIAL BIOPSY  02/02/2022   Procedure: BRONCHIAL BIOPSIES;  Surgeon: Collene Gobble, MD;  Location: [facility];  Service: Pulmonary;;   BRONCHIAL BRUSHINGS  02/02/2022   Procedure: BRONCHIAL BRUSHINGS;  Surgeon: Collene Gobble, MD;  Location: [facility] ENDOSCOPY;  Service: Pulmonary;;   BRONCHIAL NEEDLE ASPIRATION BIOPSY  02/02/2022   Procedure: BRONCHIAL NEEDLE ASPIRATION BIOPSIES;  Surgeon: Collene Gobble, MD;  Location: [facility] ENDOSCOPY;  Service: Pulmonary;;   IR GASTROSTOMY TUBE MOD SED  04/03/2022   IR IMAGING GUIDED PORT INSERTION  02/24/2022   MRI     NO PAST SURGERIES     VIDEO BRONCHOSCOPY WITH ENDOBRONCHIAL ULTRASOUND N/A 02/02/2022   Procedure: VIDEO BRONCHOSCOPY WITH ENDOBRONCHIAL ULTRASOUND;  Surgeon: Collene Gobble, MD;  Location: [facility];  Service: Pulmonary;  Laterality: N/A;   VIDEO BRONCHOSCOPY WITH RADIAL ENDOBRONCHIAL ULTRASOUND  02/02/2022   Procedure: VIDEO BRONCHOSCOPY WITH RADIAL ENDOBRONCHIAL ULTRASOUND;  Surgeon: Collene Gobble, MD;  Location: [facility] ENDOSCOPY;  Service: Pulmonary;;    REVIEW OF SYSTEMS:   Constitutional: Negative for appetite change, chills, fever and unexpected weight change. +Fatigue HENT: Negative for mouth sores, nosebleeds, sore throat and trouble swallowing.   Eyes: Negative for eye problems and icterus.  Respiratory: Negative for hemoptysis and wheezing.  +Shortness of breath with exertion and cough.  Cardiovascular: Negative for chest pain and leg swelling.  Gastrointestinal: Negative for abdominal pain,  constipation, diarrhea, nausea and vomiting.  Genitourinary: Negative for bladder incontinence, difficulty urinating, dysuria, frequency and hematuria.   Musculoskeletal: Negative for back pain, gait problem, neck pain and neck stiffness.  Skin:Negative for rash and ulcers Neurological: Negative for dizziness, extremity weakness, gait problem, headaches, light-headedness and seizures.  Hematological: Negative for adenopathy. Does not bruise/bleed easily.  Psychiatric/Behavioral: Negative for confusion, depression and sleep disturbance. The patient is not nervous/anxious.     PHYSICAL EXAMINATION:  Constitutional: Oriented to person, place, and time and well-developed, well-nourished, and in no distress.  HENT:  Head: Normocephalic and atraumatic.  Eyes: Conjunctivae are normal. Right eye exhibits no discharge. Left eye exhibits no discharge. No scleral icterus.  Neck: Normal range of motion. Neck supple.   Cardiovascular: Normal rate, regular rhythm, normal heart sounds and intact distal pulses.   Pulmonary/Chest: Effort normal and breath sounds normal. No respiratory distress. No wheezes. No rales.  Abdominal: Soft. Bowel sounds are normal. Exhibits no distension and no mass. There is no tenderness. PEG tube in place without any erythema or discharge.  Musculoskeletal:  Normal range of motion. Exhibits no edema.  Lymphadenopathy: No cervical adenopathy.  Neurological: Alert and oriented to person, place, and time. Exhibits normal muscle tone. Gait normal. Coordination normal.  Skin: Skin is warm and dry. No rash noted. Not diaphoretic. No erythema. No pallor.  Psychiatric: Mood, memory and judgment normal.    ECOG PERFORMANCE STATUS: 1 - Symptomatic but completely ambulatory  Blood pressure 124/89, pulse (!) 102, temperature 97.9 F (36.6 C), temperature source Oral, resp. rate 16, weight 117 lb 9.6 oz (53.3 kg), SpO2 97 %.  LABORATORY DATA: Lab Results  Component Value Date   WBC  7.8 09/08/2022   HGB 12.0 (L) 09/08/2022   HCT 37.0 (L) 09/08/2022   MCV 100.3 (H) 09/08/2022   PLT 208 09/08/2022      Chemistry      Component Value Date/Time   NA 138 09/08/2022 0800   K 3.5 09/08/2022 0800   CL 102 09/08/2022 0800   CO2 30 09/08/2022 0800   BUN 12 09/08/2022 0800   CREATININE 0.61 09/08/2022 0800      Component Value Date/Time   CALCIUM 9.4 09/08/2022 0800   ALKPHOS 54 09/08/2022 0800   AST 12 (L) 09/08/2022 0800   ALT 9 09/08/2022 0800   BILITOT 0.5 09/08/2022 0800       RADIOGRAPHIC STUDIES: MR Brain W Wo Contrast  Result Date: 09/08/2022 CLINICAL DATA:  Small cell lung cancer staging EXAM: MRI HEAD WITHOUT AND WITH CONTRAST TECHNIQUE: Multiplanar, multiecho pulse sequences of the brain and surrounding structures were obtained without and with intravenous contrast. CONTRAST:  81mL GADAVIST GADOBUTROL 1 MMOL/ML IV SOLN COMPARISON:  01/20/2022 FINDINGS: Brain: No abnormal enhancement or swelling to suggest metastatic disease. No acute or subacute infarction, hemorrhage, hydrocephalus, extra-axial collection or mass lesion. Remote hemorrhagic insults in the bilateral putamen with hemosiderin staining. Mild chronic small vessel ischemic type change in the cerebral white matter and pons. Preserved brain volume. Vascular: Major flow voids and vascular enhancements are preserved. Skull and upper cervical spine: Normal marrow signal Sinuses/Orbits: Negative IMPRESSION: Negative for metastatic disease to the brain. Electronically Signed   By: Jorje Guild M.D.   On: 09/08/2022 07:51   MR LIVER W WO CONTRAST  Result Date: 08/24/2022 CLINICAL DATA:  Lung cancer, staging. EXAM: MRI ABDOMEN WITHOUT AND WITH CONTRAST TECHNIQUE: Multiplanar multisequence MR imaging of the abdomen was performed both before and after the administration of intravenous contrast. CONTRAST:  41mL GADAVIST GADOBUTROL 1 MMOL/ML IV SOLN COMPARISON:  Chest CT August 10, 2022 and PET-CT January 20, 2022 FINDINGS: Lower chest: No acute abnormality. Hepatobiliary: No hepatic steatosis. Bilobar hepatic lesions demonstrate intermediate T2 signal with intrinsic T1 hypointensity, reduced diffusivity and peripheral enhancement consistent with hepatic metastasis. Indexed lesions are as follows: -segment III hepatic lesion measures 18 x 14 mm on image 14/32. -segment V hepatic lesion measures 11 x 10 mm on image 25/32. Scattered bilobar benign hepatic cysts. Pancreas: Intrinsic T1 signal of the pancreatic parenchyma is within normal limits. Pancreatic divisum. No pancreatic ductal dilation or evidence of acute inflammation. Spleen:  No splenomegaly. Adrenals/Urinary Tract: Bilateral adrenal glands appear normal. No suspicious renal mass. Stomach/Bowel: Percutaneous gastrostomy tube with retention balloon in the stomach. No pathologic dilation or evidence of acute inflammation involving loops of large or small bowel in the abdomen. Vascular/Lymphatic: Normal caliber abdominal aorta. Portal, splenic and superior mesenteric veins are patent. No pathologically enlarged abdominal lymph nodes Other:  None. Musculoskeletal: No suspicious bone lesions identified. IMPRESSION: Bilobar hepatic lesion  demonstrate imaging characteristics consistent with metastases. Electronically Signed   By: Dahlia Bailiff M.D.   On: 08/24/2022 11:59   CT Chest W Contrast  Result Date: 08/10/2022 CLINICAL DATA:  Small cell lung cancer.  * Tracking Code: BO * EXAM: CT CHEST WITH CONTRAST TECHNIQUE: Multidetector CT imaging of the chest was performed during intravenous contrast administration. RADIATION DOSE REDUCTION: This exam was performed according to the departmental dose-optimization program which includes automated exposure control, adjustment of the mA and/or kV according to patient size and/or use of iterative reconstruction technique. CONTRAST:  132mL OMNIPAQUE IOHEXOL 300 MG/ML  SOLN COMPARISON:  06/02/2022 and PET 01/20/2022.  FINDINGS: Cardiovascular: Right IJ Port-A-Cath terminates in the right atrium. Atherosclerotic calcification of the aorta and coronary arteries. Heart size normal. Left ventricle appears somewhat dilated. No pericardial effusion. Mediastinum/Nodes: No pathologically enlarged mediastinal, hilar or axillary lymph nodes. Esophagus is grossly unremarkable. Lungs/Pleura: Bullous paraseptal and centrilobular emphysema. Biapical pleuroparenchymal scarring. Nodular consolidation in the anterior segment right upper lobe has decreased slightly in size, now measuring 1.4 x 2.2 cm (7/53), previously 2.2 x 2.7 cm. It is seen in association with a previously seen partially calcified non hypermetabolic nodule, better seen and measured on prior exams. Scarring in the medial aspects of both lower lobes. A few scattered millimetric pulmonary nodules measure 3 mm or less in size and are unchanged. No pleural fluid. Debris is seen in the airway. Upper Abdomen: Minimally hypodense lesions in left hepatic lobe, measure 8 mm (2/170) and 1.6 cm (2/174), respectively, new from 01/20/2022. 5 mm low-attenuation lesion in the dome of the left hepatic lobe (2/152), too small to characterize. Visualized portions of the adrenal glands, kidneys, spleen, pancreas, stomach and bowel are grossly unremarkable. Upper abdominal lymph nodes are not enlarged by CT size criteria. Musculoskeletal: Degenerative changes in the spine. Old left rib fractures. No worrisome lytic or sclerotic lesions. IMPRESSION: 1. Interval decrease in size of nodular consolidation in the right upper lobe, possibly due to a resolving infectious/inflammatory insult. Recommend continued attention on follow-up exams. Finding is seen in association with a partially calcified nodule, not shown to be hypermetabolic on previous PET and better seen on prior studies. 2. Hypodense lesions in the left hepatic lobe are new from PET 01/20/2022 and worrisome for metastases. 3. No recurrence  at the site of the primary left upper lobe nodule, originally seen on PET 01/20/2022 . 4. Aortic atherosclerosis (ICD10-I70.0). Coronary artery calcification. 5.  Emphysema (ICD10-J43.9). Electronically Signed   By: Lorin Picket M.D.   On: 08/10/2022 14:39    ASSESSMENT AND PLAN:  This is a very pleasant 66 years old white male recently diagnosed with metastatic small cell lung cancer involving the liver.   #Metastatic small cell lung cancer: --Diagnosed in February 2023 with the bilateral nodules in the right upper lobe as well as the left upper lobe but this could be also treated as limited stage disease because of the proximity of the lesions. --Received first line therapy with cisplatin 80 Mg/M2 on day 1 and etoposide 100 Mg/M2 on days 1, 2 and 3 every 3.  Received 4 cycles from 02/17/2022-07/16/2022. Patient was hospitalized after cycle 1 with acute respiratory failure for several weeks.Resumed cycle 2 with dose reduction of cisplatin to 60 mg/m2 and etoposide to 80 mg/m2.  --Most recent MRI liver from 08/24/2022 bilobar hepatic lesions consistent with metastases.  --Recommended to switch to second line therapy with Zepzelca (lurbinectedin) at reduced dose of 2.6 mg/m2 every 3 weeks. --Patient  presents today, 09/08/2022, to start cycle 1 of Zepzelca. Labs from today were reviewed and adequate for treatment. WBC 7.8, Hgb 12,0 and Plt 208. Normal creatinine/LFTs.  --Recommend to proceed with treatment today as planned. He will return next week for lab check to monitor blood counts.  --Next follow up visit will be on 09/28/2022 with Dr. Julien Nordmann before Cycle 2.   #Vision changes: --Patient reports flickering in his eye.  --MRI brain from yesterday was negative for intracranial metastases.   The patient voices understanding of current disease status and treatment options and is in agreement with the current care plan.  All questions were answered. The patient knows to call the clinic with any  problems, questions or concerns. We can certainly see the patient much sooner if necessary.  I have spent a total of 30 minutes minutes of face-to-face and non-face-to-face time, preparing to see the patient, performing a medically appropriate examination, counseling and educating the patient, documenting clinical information in the electronic health record, and care coordination.    Dede Query PA-C Dept of Hematology and [place] at [facility] Phone: [phone]

## 2022-09-08 NOTE — Patient Instructions (Signed)
[facility]  Discharge Instructions: Thank you for choosing [facility] to provide your oncology and hematology care.   If you have a lab appointment with the [facility], please go directly to the [facility] and check in at the registration area.   Wear comfortable clothing and clothing appropriate for easy access to any Portacath or PICC line.   We strive to give you quality time with your provider. You may need to reschedule your appointment if you arrive late (15 or more minutes).  Arriving late affects you and other patients whose appointments are after yours.  Also, if you miss three or more appointments without notifying the office, you may be dismissed from the clinic at the provider's discretion.      For prescription refill requests, have your pharmacy contact our office and allow 72 hours for refills to be completed.    Today you received the following chemotherapy and/or immunotherapy agents: lurbinectedin (Zepzelca)      To help prevent nausea and vomiting after your treatment, we encourage you to take your nausea medication as directed.  BELOW ARE SYMPTOMS THAT SHOULD BE REPORTED IMMEDIATELY: *FEVER GREATER THAN 100.4 F (38 C) OR HIGHER *CHILLS OR SWEATING *NAUSEA AND VOMITING THAT IS NOT CONTROLLED WITH YOUR NAUSEA MEDICATION *UNUSUAL SHORTNESS OF BREATH *UNUSUAL BRUISING OR BLEEDING *URINARY PROBLEMS (pain or burning when urinating, or frequent urination) *BOWEL PROBLEMS (unusual diarrhea, constipation, pain near the anus) TENDERNESS IN MOUTH AND THROAT WITH OR WITHOUT PRESENCE OF ULCERS (sore throat, sores in mouth, or a toothache) UNUSUAL RASH, SWELLING OR PAIN  UNUSUAL VAGINAL DISCHARGE OR ITCHING   Items with * indicate a potential emergency and should be followed up as soon as possible or go to the Emergency Department if any problems should occur.  Please show the CHEMOTHERAPY ALERT CARD or IMMUNOTHERAPY ALERT CARD  at check-in to the Emergency Department and triage nurse.  Should you have questions after your visit or need to cancel or reschedule your appointment, please contact [facility]  Dept: [phone]  and follow the prompts.  Office hours are 8:00 a.m. to 4:30 p.m. Monday - Friday. Please note that voicemails left after 4:00 p.m. may not be returned until the following business day.  We are closed weekends and major holidays. You have access to a nurse at all times for urgent questions. Please call the main number to the clinic Dept: [phone] and follow the prompts.   For any non-urgent questions, you may also contact your provider using MyChart. We now offer e-Visits for anyone 67 and older to request care online for non-urgent symptoms. For details visit [email or web address].   Also download the MyChart app! Go to the app store, search "MyChart", open the app, select [facility], and log in with your MyChart username and password.  Masks are optional in the cancer centers. If you would like for your care team to wear a mask while they are taking care of you, please let them know. You may have one support person who is at least 65 years old accompany you for your appointments.   Lurbinectedin Injection What is this medication? LURBINECTEDIN (LOOR bin EK te din) treats lung cancer. It works by slowing down the growth of cancer cells. This medicine may be used for other purposes; ask your health care provider or pharmacist if you have questions. COMMON BRAND NAME(S): ZEPZELCA What should I tell my care team before I take this  medication? They need to know if you have any of these conditions: Liver disease Low blood cell levels, such as low white cells, platelets, red blood cells An unusual or allergic reaction to lurbinectedin, other medications, foods, dyes, or preservatives If you or your partner are pregnant or trying to get pregnant Breastfeeding How  should I use this medication? This medication is injected into a vein. It is given by your care team in a hospital or clinic setting. Talk to your care team about the use of this medication in children. Special care may be needed. Overdosage: If you think you have taken too much of this medicine contact a poison control center or emergency room at once. NOTE: This medicine is only for you. Do not share this medicine with others. What if I miss a dose? Keep appointments for follow-up doses. It is important not to miss your dose. Call your care team if you are unable to keep an appointment. What may interact with this medication? Certain antibiotics, such as erythromycin, clarithromycin Certain antivirals for HIV or hepatitis Certain medications for fungal infections, such as ketoconazole, itraconazole, posaconazole Certain medications for seizures, such as carbamazepine, phenobarbital, phenytoin Grapefruit or grapefruit juice St. John's wort This list may not describe all possible interactions. Give your health care provider a list of all the medicines, herbs, non-prescription drugs, or dietary supplements you use. Also tell them if you smoke, drink alcohol, or use illegal drugs. Some items may interact with your medicine. What should I watch for while using this medication? Your condition will be monitored carefully while you are receiving this medication. This medication may make you feel generally unwell. This is not uncommon as chemotherapy can affect healthy cells as well as cancer cells. Report any side effects. Continue your course of treatment even though you feel ill unless your care team tells you to stop. This medication may increase your risk of getting an infection. Call your care team for advice if you get a fever, chills, sore throat, or other symptoms of a cold or flu. Do not treat yourself. Try to avoid being around people who are sick. Avoid taking medications that contain  aspirin, acetaminophen, ibuprofen, naproxen, or ketoprofen unless instructed by your care team. These medications may hide a fever. Be careful brushing or flossing your teeth or using a toothpick because you may get an infection or bleed more easily. If you have any dental work done, tell your dentist you are receiving this medication. Talk to your care team if you may be pregnant. Serious birth defects can occur if you take this medication during pregnancy and for 6 months after the last dose. You will need a negative pregnancy test before starting this medication. Contraception is recommended while taking this medication and for 6 months after the last dose. If your partner can get pregnant, use a condom during sex while taking this medication and for 4 months after the last dose. Do not breastfeed while taking this medication and for 2 weeks after the last dose. What side effects may I notice from receiving this medication? Side effects that you should report to your care team as soon as possible: Allergic reactions--skin rash, itching, hives, swelling of the face, lips, tongue, or throat Infection--fever, chills, cough, sore throat, wounds that don't heal, pain or trouble when passing urine, general feeling of discomfort or being unwell Liver injury--right upper belly pain, loss of appetite, nausea, light-colored stool, dark yellow or brown urine, yellowing skin or eyes, unusual  weakness or fatigue Low red blood cell level--unusual weakness or fatigue, dizziness, headache, trouble breathing Muscle injury--unusual weakness or fatigue, muscle pain, dark yellow or brown urine, decrease in amount of urine Painful swelling, warmth, or redness of the skin, blisters or sores at the infusion site Unusual bruising or bleeding Side effects that usually do not require medical attention (report these to your care team if they continue or are bothersome): Constipation Cough Diarrhea Fatigue Loss of  appetite Nausea This list may not describe all possible side effects. Call your doctor for medical advice about side effects. You may report side effects to FDA at 1-800-FDA-1088. Where should I keep my medication? This medication is given in a hospital or clinic. It will not be stored at home. NOTE: This sheet is a summary. It may not cover all possible information. If you have questions about this medicine, talk to your doctor, pharmacist, or health care provider.  2023 Elsevier/Gold Standard (2022-05-05 00:00:00)

## 2022-09-14 ENCOUNTER — Other Ambulatory Visit: Payer: Self-pay

## 2022-09-14 DIAGNOSIS — C3412 Malignant neoplasm of upper lobe, left bronchus or lung: Secondary | ICD-10-CM

## 2022-09-15 ENCOUNTER — Inpatient Hospital Stay: Payer: Medicare Other

## 2022-09-15 ENCOUNTER — Other Ambulatory Visit: Payer: Self-pay

## 2022-09-15 DIAGNOSIS — C3412 Malignant neoplasm of upper lobe, left bronchus or lung: Secondary | ICD-10-CM

## 2022-09-15 DIAGNOSIS — Z5111 Encounter for antineoplastic chemotherapy: Secondary | ICD-10-CM | POA: Diagnosis not present

## 2022-09-15 DIAGNOSIS — Z95828 Presence of other vascular implants and grafts: Secondary | ICD-10-CM

## 2022-09-15 LAB — CMP (CANCER CENTER ONLY)
ALT: 13 U/L (ref 0–44)
AST: 15 U/L (ref 15–41)
Albumin: 4.1 g/dL (ref 3.5–5.0)
Alkaline Phosphatase: 42 U/L (ref 38–126)
Anion gap: 6 (ref 5–15)
BUN: 13 mg/dL (ref 8–23)
CO2: 30 mmol/L (ref 22–32)
Calcium: 8.8 mg/dL — ABNORMAL LOW (ref 8.9–10.3)
Chloride: 100 mmol/L (ref 98–111)
Creatinine: 0.53 mg/dL — ABNORMAL LOW (ref 0.61–1.24)
GFR, Estimated: >60 mL/min (ref >60)
Glucose, Bld: 131 mg/dL — ABNORMAL HIGH (ref 70–99)
Potassium: 3.5 mmol/L (ref 3.5–5.1)
Sodium: 136 mmol/L (ref 135–145)
Total Bilirubin: 0.9 mg/dL (ref 0.3–1.2)
Total Protein: 6.3 g/dL — ABNORMAL LOW (ref 6.5–8.1)

## 2022-09-15 LAB — CBC WITH DIFFERENTIAL (CANCER CENTER ONLY)
Abs Immature Granulocytes: 0.01 10*3/uL (ref 0.00–0.07)
Basophils Absolute: 0 10*3/uL (ref 0.0–0.1)
Basophils Relative: 0 %
Eosinophils Absolute: 0 10*3/uL (ref 0.0–0.5)
Eosinophils Relative: 0 %
HCT: 30.9 % — ABNORMAL LOW (ref 39.0–52.0)
Hemoglobin: 10.3 g/dL — ABNORMAL LOW (ref 13.0–17.0)
Immature Granulocytes: 0 %
Lymphocytes Relative: 17 %
Lymphs Abs: 0.6 10*3/uL — ABNORMAL LOW (ref 0.7–4.0)
MCH: 32.4 pg (ref 26.0–34.0)
MCHC: 33.3 g/dL (ref 30.0–36.0)
MCV: 97.2 fL (ref 80.0–100.0)
Monocytes Absolute: 0 10*3/uL — ABNORMAL LOW (ref 0.1–1.0)
Monocytes Relative: 1 %
Neutro Abs: 2.7 10*3/uL (ref 1.7–7.7)
Neutrophils Relative %: 82 %
Platelet Count: 79 10*3/uL — ABNORMAL LOW (ref 150–400)
RBC: 3.18 MIL/uL — ABNORMAL LOW (ref 4.22–5.81)
RDW: 16.3 % — ABNORMAL HIGH (ref 11.5–15.5)
WBC Count: 3.3 10*3/uL — ABNORMAL LOW (ref 4.0–10.5)
nRBC: 0 % (ref 0.0–0.2)

## 2022-09-15 MED ORDER — SODIUM CHLORIDE 0.9% FLUSH
10.0000 mL | Freq: Once | INTRAVENOUS | Status: AC
  Administered 2022-09-15: 10 mL

## 2022-09-15 MED ORDER — HEPARIN SOD (PORK) LOCK FLUSH 100 UNIT/ML IV SOLN
500.0000 [IU] | Freq: Once | INTRAVENOUS | Status: AC
  Administered 2022-09-15: 500 [IU]

## 2022-09-17 ENCOUNTER — Telehealth: Payer: Self-pay

## 2022-09-17 NOTE — Telephone Encounter (Signed)
Call received from pt c/o fatigue and wanted to know if this was normal with is new tx of Zepzelca. Pt has been advised this is a notmal side effect. Pt wanted to know how to manage his fatigue or if there was anything he could take to improve it. I have sent a message to him via MyChart with information regarding management of fatigue.

## 2022-09-21 ENCOUNTER — Other Ambulatory Visit: Payer: Self-pay

## 2022-09-21 DIAGNOSIS — C3412 Malignant neoplasm of upper lobe, left bronchus or lung: Secondary | ICD-10-CM

## 2022-09-22 ENCOUNTER — Inpatient Hospital Stay: Payer: Medicare Other

## 2022-09-22 ENCOUNTER — Other Ambulatory Visit: Payer: Self-pay

## 2022-09-22 ENCOUNTER — Other Ambulatory Visit: Payer: Self-pay | Admitting: Internal Medicine

## 2022-09-22 DIAGNOSIS — Z5111 Encounter for antineoplastic chemotherapy: Secondary | ICD-10-CM | POA: Diagnosis not present

## 2022-09-22 DIAGNOSIS — Z95828 Presence of other vascular implants and grafts: Secondary | ICD-10-CM

## 2022-09-22 DIAGNOSIS — C3412 Malignant neoplasm of upper lobe, left bronchus or lung: Secondary | ICD-10-CM

## 2022-09-22 LAB — CBC WITH DIFFERENTIAL (CANCER CENTER ONLY)
Abs Immature Granulocytes: 0 10*3/uL (ref 0.00–0.07)
Basophils Absolute: 0 10*3/uL (ref 0.0–0.1)
Basophils Relative: 0 %
Eosinophils Absolute: 0 10*3/uL (ref 0.0–0.5)
Eosinophils Relative: 1 %
HCT: 30.9 % — ABNORMAL LOW (ref 39.0–52.0)
Hemoglobin: 10.2 g/dL — ABNORMAL LOW (ref 13.0–17.0)
Immature Granulocytes: 0 %
Lymphocytes Relative: 34 %
Lymphs Abs: 0.5 10*3/uL — ABNORMAL LOW (ref 0.7–4.0)
MCH: 33 pg (ref 26.0–34.0)
MCHC: 33 g/dL (ref 30.0–36.0)
MCV: 100 fL (ref 80.0–100.0)
Monocytes Absolute: 0.3 10*3/uL (ref 0.1–1.0)
Monocytes Relative: 22 %
Neutro Abs: 0.6 10*3/uL — ABNORMAL LOW (ref 1.7–7.7)
Neutrophils Relative %: 43 %
Platelet Count: 155 10*3/uL (ref 150–400)
RBC: 3.09 MIL/uL — ABNORMAL LOW (ref 4.22–5.81)
RDW: 17.9 % — ABNORMAL HIGH (ref 11.5–15.5)
WBC Count: 1.4 10*3/uL — ABNORMAL LOW (ref 4.0–10.5)
nRBC: 0 % (ref 0.0–0.2)

## 2022-09-22 LAB — CMP (CANCER CENTER ONLY)
ALT: 10 U/L (ref 0–44)
AST: 12 U/L — ABNORMAL LOW (ref 15–41)
Albumin: 4.1 g/dL (ref 3.5–5.0)
Alkaline Phosphatase: 42 U/L (ref 38–126)
Anion gap: 6 (ref 5–15)
BUN: 9 mg/dL (ref 8–23)
CO2: 31 mmol/L (ref 22–32)
Calcium: 8.9 mg/dL (ref 8.9–10.3)
Chloride: 102 mmol/L (ref 98–111)
Creatinine: 0.55 mg/dL — ABNORMAL LOW (ref 0.61–1.24)
GFR, Estimated: >60 mL/min (ref >60)
Glucose, Bld: 100 mg/dL — ABNORMAL HIGH (ref 70–99)
Potassium: 3.6 mmol/L (ref 3.5–5.1)
Sodium: 139 mmol/L (ref 135–145)
Total Bilirubin: 0.4 mg/dL (ref 0.3–1.2)
Total Protein: 6.5 g/dL (ref 6.5–8.1)

## 2022-09-22 MED ORDER — HEPARIN SOD (PORK) LOCK FLUSH 100 UNIT/ML IV SOLN
500.0000 [IU] | Freq: Once | INTRAVENOUS | Status: AC
  Administered 2022-09-22: 500 [IU]

## 2022-09-22 MED ORDER — SODIUM CHLORIDE 0.9% FLUSH
10.0000 mL | Freq: Once | INTRAVENOUS | Status: AC
  Administered 2022-09-22: 10 mL

## 2022-09-23 ENCOUNTER — Ambulatory Visit: Payer: Medicare Other

## 2022-09-24 ENCOUNTER — Inpatient Hospital Stay: Payer: Medicare Other

## 2022-09-24 ENCOUNTER — Other Ambulatory Visit: Payer: Self-pay

## 2022-09-24 ENCOUNTER — Ambulatory Visit: Payer: Medicare Other

## 2022-09-24 VITALS — BP 137/83 | HR 98 | Temp 98.6°F | Resp 18

## 2022-09-24 DIAGNOSIS — Z95828 Presence of other vascular implants and grafts: Secondary | ICD-10-CM

## 2022-09-24 DIAGNOSIS — Z5111 Encounter for antineoplastic chemotherapy: Secondary | ICD-10-CM | POA: Diagnosis not present

## 2022-09-24 DIAGNOSIS — C3412 Malignant neoplasm of upper lobe, left bronchus or lung: Secondary | ICD-10-CM

## 2022-09-24 MED ORDER — FILGRASTIM-AAFI 300 MCG/0.5ML IJ SOSY
300.0000 ug | PREFILLED_SYRINGE | Freq: Once | INTRAMUSCULAR | Status: AC
  Administered 2022-09-24: 300 ug via SUBCUTANEOUS
  Filled 2022-09-24: qty 0.5

## 2022-09-25 ENCOUNTER — Inpatient Hospital Stay: Payer: Medicare Other

## 2022-09-25 VITALS — BP 134/81 | HR 105 | Temp 98.4°F | Resp 20

## 2022-09-25 DIAGNOSIS — Z95828 Presence of other vascular implants and grafts: Secondary | ICD-10-CM

## 2022-09-25 DIAGNOSIS — Z5111 Encounter for antineoplastic chemotherapy: Secondary | ICD-10-CM | POA: Diagnosis not present

## 2022-09-25 MED ORDER — FILGRASTIM-AAFI 300 MCG/0.5ML IJ SOSY
300.0000 ug | PREFILLED_SYRINGE | Freq: Once | INTRAMUSCULAR | Status: AC
  Administered 2022-09-25: 300 ug via SUBCUTANEOUS
  Filled 2022-09-25: qty 0.5

## 2022-09-25 MED FILL — Dexamethasone Sodium Phosphate Inj 100 MG/10ML: INTRAMUSCULAR | Qty: 1 | Status: AC

## 2022-09-28 ENCOUNTER — Inpatient Hospital Stay (HOSPITAL_BASED_OUTPATIENT_CLINIC_OR_DEPARTMENT_OTHER): Payer: Medicare Other | Admitting: Internal Medicine

## 2022-09-28 ENCOUNTER — Other Ambulatory Visit: Payer: Self-pay

## 2022-09-28 ENCOUNTER — Ambulatory Visit: Payer: Medicare Other

## 2022-09-28 ENCOUNTER — Inpatient Hospital Stay: Payer: Medicare Other

## 2022-09-28 ENCOUNTER — Other Ambulatory Visit: Payer: Medicare Other

## 2022-09-28 ENCOUNTER — Encounter: Payer: Self-pay | Admitting: Internal Medicine

## 2022-09-28 ENCOUNTER — Inpatient Hospital Stay: Payer: Medicare Other | Attending: Internal Medicine

## 2022-09-28 ENCOUNTER — Ambulatory Visit: Payer: Medicare Other | Admitting: Oncology

## 2022-09-28 DIAGNOSIS — C3412 Malignant neoplasm of upper lobe, left bronchus or lung: Secondary | ICD-10-CM | POA: Diagnosis present

## 2022-09-28 DIAGNOSIS — R0600 Dyspnea, unspecified: Secondary | ICD-10-CM | POA: Insufficient documentation

## 2022-09-28 DIAGNOSIS — Z7952 Long term (current) use of systemic steroids: Secondary | ICD-10-CM | POA: Diagnosis not present

## 2022-09-28 DIAGNOSIS — C3411 Malignant neoplasm of upper lobe, right bronchus or lung: Secondary | ICD-10-CM | POA: Insufficient documentation

## 2022-09-28 DIAGNOSIS — R059 Cough, unspecified: Secondary | ICD-10-CM | POA: Insufficient documentation

## 2022-09-28 DIAGNOSIS — Z5111 Encounter for antineoplastic chemotherapy: Secondary | ICD-10-CM | POA: Insufficient documentation

## 2022-09-28 DIAGNOSIS — R5383 Other fatigue: Secondary | ICD-10-CM | POA: Insufficient documentation

## 2022-09-28 DIAGNOSIS — I10 Essential (primary) hypertension: Secondary | ICD-10-CM | POA: Insufficient documentation

## 2022-09-28 DIAGNOSIS — Z79899 Other long term (current) drug therapy: Secondary | ICD-10-CM | POA: Diagnosis not present

## 2022-09-28 DIAGNOSIS — F17210 Nicotine dependence, cigarettes, uncomplicated: Secondary | ICD-10-CM | POA: Insufficient documentation

## 2022-09-28 DIAGNOSIS — Z95828 Presence of other vascular implants and grafts: Secondary | ICD-10-CM

## 2022-09-28 LAB — CMP (CANCER CENTER ONLY)
ALT: 10 U/L (ref 0–44)
AST: 17 U/L (ref 15–41)
Albumin: 4 g/dL (ref 3.5–5.0)
Alkaline Phosphatase: 86 U/L (ref 38–126)
Anion gap: 7 (ref 5–15)
BUN: 12 mg/dL (ref 8–23)
CO2: 29 mmol/L (ref 22–32)
Calcium: 8.9 mg/dL (ref 8.9–10.3)
Chloride: 104 mmol/L (ref 98–111)
Creatinine: 0.57 mg/dL — ABNORMAL LOW (ref 0.61–1.24)
GFR, Estimated: >60 mL/min (ref >60)
Glucose, Bld: 137 mg/dL — ABNORMAL HIGH (ref 70–99)
Potassium: 3.6 mmol/L (ref 3.5–5.1)
Sodium: 140 mmol/L (ref 135–145)
Total Bilirubin: 0.4 mg/dL (ref 0.3–1.2)
Total Protein: 6.1 g/dL — ABNORMAL LOW (ref 6.5–8.1)

## 2022-09-28 LAB — CBC WITH DIFFERENTIAL (CANCER CENTER ONLY)
Abs Immature Granulocytes: 1.82 10*3/uL — ABNORMAL HIGH (ref 0.00–0.07)
Basophils Absolute: 0 10*3/uL (ref 0.0–0.1)
Basophils Relative: 0 %
Eosinophils Absolute: 0 10*3/uL (ref 0.0–0.5)
Eosinophils Relative: 0 %
HCT: 34.2 % — ABNORMAL LOW (ref 39.0–52.0)
Hemoglobin: 11.1 g/dL — ABNORMAL LOW (ref 13.0–17.0)
Immature Granulocytes: 11 %
Lymphocytes Relative: 8 %
Lymphs Abs: 1.3 10*3/uL (ref 0.7–4.0)
MCH: 33.2 pg (ref 26.0–34.0)
MCHC: 32.5 g/dL (ref 30.0–36.0)
MCV: 102.4 fL — ABNORMAL HIGH (ref 80.0–100.0)
Monocytes Absolute: 2.3 10*3/uL — ABNORMAL HIGH (ref 0.1–1.0)
Monocytes Relative: 14 %
Neutro Abs: 10.9 10*3/uL — ABNORMAL HIGH (ref 1.7–7.7)
Neutrophils Relative %: 67 %
Platelet Count: 238 10*3/uL (ref 150–400)
RBC: 3.34 MIL/uL — ABNORMAL LOW (ref 4.22–5.81)
RDW: 19.2 % — ABNORMAL HIGH (ref 11.5–15.5)
Smear Review: NORMAL
WBC Count: 16.3 10*3/uL — ABNORMAL HIGH (ref 4.0–10.5)
nRBC: 0.2 % (ref 0.0–0.2)

## 2022-09-28 MED ORDER — SODIUM CHLORIDE 0.9% FLUSH
10.0000 mL | Freq: Once | INTRAVENOUS | Status: AC
  Administered 2022-09-28: 10 mL

## 2022-09-28 MED ORDER — HEPARIN SOD (PORK) LOCK FLUSH 100 UNIT/ML IV SOLN: 500.0000 [IU] | Freq: Once | INTRAVENOUS | Status: DC | PRN

## 2022-09-28 MED ORDER — SODIUM CHLORIDE 0.9% FLUSH: 10.0000 mL | INTRAVENOUS | Status: DC | PRN

## 2022-09-28 MED ORDER — SODIUM CHLORIDE 0.9 % IV SOLN
10.0000 mg | Freq: Once | INTRAVENOUS | Status: AC
  Administered 2022-09-28: 10 mg via INTRAVENOUS
  Filled 2022-09-28: qty 10

## 2022-09-28 MED ORDER — PALONOSETRON HCL INJECTION 0.25 MG/5ML
0.2500 mg | Freq: Once | INTRAVENOUS | Status: AC
  Administered 2022-09-28: 0.25 mg via INTRAVENOUS
  Filled 2022-09-28: qty 5

## 2022-09-28 MED ORDER — SODIUM CHLORIDE 0.9 % IV SOLN
2.4400 mg/m2 | Freq: Once | INTRAVENOUS | Status: AC
  Administered 2022-09-28: 4 mg via INTRAVENOUS
  Filled 2022-09-28: qty 8

## 2022-09-28 MED ORDER — SODIUM CHLORIDE 0.9 % IV SOLN: Freq: Once | INTRAVENOUS | Status: AC

## 2022-09-28 NOTE — Patient Instructions (Signed)
[facility]  Discharge Instructions: Thank you for choosing [facility] to provide your oncology and hematology care.   If you have a lab appointment with the [facility], please go directly to the [facility] and check in at the registration area.   Wear comfortable clothing and clothing appropriate for easy access to any Portacath or PICC line.   We strive to give you quality time with your provider. You may need to reschedule your appointment if you arrive late (15 or more minutes).  Arriving late affects you and other patients whose appointments are after yours.  Also, if you miss three or more appointments without notifying the office, you may be dismissed from the clinic at the provider's discretion.      For prescription refill requests, have your pharmacy contact our office and allow 72 hours for refills to be completed.    Today you received the following chemotherapy and/or immunotherapy agents: lurbinectedin (Zepzelca)      To help prevent nausea and vomiting after your treatment, we encourage you to take your nausea medication as directed.  BELOW ARE SYMPTOMS THAT SHOULD BE REPORTED IMMEDIATELY: *FEVER GREATER THAN 100.4 F (38 C) OR HIGHER *CHILLS OR SWEATING *NAUSEA AND VOMITING THAT IS NOT CONTROLLED WITH YOUR NAUSEA MEDICATION *UNUSUAL SHORTNESS OF BREATH *UNUSUAL BRUISING OR BLEEDING *URINARY PROBLEMS (pain or burning when urinating, or frequent urination) *BOWEL PROBLEMS (unusual diarrhea, constipation, pain near the anus) TENDERNESS IN MOUTH AND THROAT WITH OR WITHOUT PRESENCE OF ULCERS (sore throat, sores in mouth, or a toothache) UNUSUAL RASH, SWELLING OR PAIN  UNUSUAL VAGINAL DISCHARGE OR ITCHING   Items with * indicate a potential emergency and should be followed up as soon as possible or go to the Emergency Department if any problems should occur.  Please show the CHEMOTHERAPY ALERT CARD or IMMUNOTHERAPY ALERT CARD  at check-in to the Emergency Department and triage nurse.  Should you have questions after your visit or need to cancel or reschedule your appointment, please contact [facility]  Dept: [phone]  and follow the prompts.  Office hours are 8:00 a.m. to 4:30 p.m. Monday - Friday. Please note that voicemails left after 4:00 p.m. may not be returned until the following business day.  We are closed weekends and major holidays. You have access to a nurse at all times for urgent questions. Please call the main number to the clinic Dept: [phone] and follow the prompts.   For any non-urgent questions, you may also contact your provider using MyChart. We now offer e-Visits for anyone 39 and older to request care online for non-urgent symptoms. For details visit [email or web address].   Also download the MyChart app! Go to the app store, search "MyChart", open the app, select [facility], and log in with your MyChart username and password.  Masks are optional in the cancer centers. If you would like for your care team to wear a mask while they are taking care of you, please let them know. You may have one support person who is at least 65 years old accompany you for your appointments.   Lurbinectedin Injection What is this medication? LURBINECTEDIN (LOOR bin EK te din) treats lung cancer. It works by slowing down the growth of cancer cells. This medicine may be used for other purposes; ask your health care provider or pharmacist if you have questions. COMMON BRAND NAME(S): ZEPZELCA What should I tell my care team before I take this  medication? They need to know if you have any of these conditions: Liver disease Low blood cell levels, such as low white cells, platelets, red blood cells An unusual or allergic reaction to lurbinectedin, other medications, foods, dyes, or preservatives If you or your partner are pregnant or trying to get pregnant Breastfeeding How  should I use this medication? This medication is injected into a vein. It is given by your care team in a hospital or clinic setting. Talk to your care team about the use of this medication in children. Special care may be needed. Overdosage: If you think you have taken too much of this medicine contact a poison control center or emergency room at once. NOTE: This medicine is only for you. Do not share this medicine with others. What if I miss a dose? Keep appointments for follow-up doses. It is important not to miss your dose. Call your care team if you are unable to keep an appointment. What may interact with this medication? Certain antibiotics, such as erythromycin, clarithromycin Certain antivirals for HIV or hepatitis Certain medications for fungal infections, such as ketoconazole, itraconazole, posaconazole Certain medications for seizures, such as carbamazepine, phenobarbital, phenytoin Grapefruit or grapefruit juice St. John's wort This list may not describe all possible interactions. Give your health care provider a list of all the medicines, herbs, non-prescription drugs, or dietary supplements you use. Also tell them if you smoke, drink alcohol, or use illegal drugs. Some items may interact with your medicine. What should I watch for while using this medication? Your condition will be monitored carefully while you are receiving this medication. This medication may make you feel generally unwell. This is not uncommon as chemotherapy can affect healthy cells as well as cancer cells. Report any side effects. Continue your course of treatment even though you feel ill unless your care team tells you to stop. This medication may increase your risk of getting an infection. Call your care team for advice if you get a fever, chills, sore throat, or other symptoms of a cold or flu. Do not treat yourself. Try to avoid being around people who are sick. Avoid taking medications that contain  aspirin, acetaminophen, ibuprofen, naproxen, or ketoprofen unless instructed by your care team. These medications may hide a fever. Be careful brushing or flossing your teeth or using a toothpick because you may get an infection or bleed more easily. If you have any dental work done, tell your dentist you are receiving this medication. Talk to your care team if you may be pregnant. Serious birth defects can occur if you take this medication during pregnancy and for 6 months after the last dose. You will need a negative pregnancy test before starting this medication. Contraception is recommended while taking this medication and for 6 months after the last dose. If your partner can get pregnant, use a condom during sex while taking this medication and for 4 months after the last dose. Do not breastfeed while taking this medication and for 2 weeks after the last dose. What side effects may I notice from receiving this medication? Side effects that you should report to your care team as soon as possible: Allergic reactions--skin rash, itching, hives, swelling of the face, lips, tongue, or throat Infection--fever, chills, cough, sore throat, wounds that don't heal, pain or trouble when passing urine, general feeling of discomfort or being unwell Liver injury--right upper belly pain, loss of appetite, nausea, light-colored stool, dark yellow or brown urine, yellowing skin or eyes, unusual  weakness or fatigue Low red blood cell level--unusual weakness or fatigue, dizziness, headache, trouble breathing Muscle injury--unusual weakness or fatigue, muscle pain, dark yellow or brown urine, decrease in amount of urine Painful swelling, warmth, or redness of the skin, blisters or sores at the infusion site Unusual bruising or bleeding Side effects that usually do not require medical attention (report these to your care team if they continue or are bothersome): Constipation Cough Diarrhea Fatigue Loss of  appetite Nausea This list may not describe all possible side effects. Call your doctor for medical advice about side effects. You may report side effects to FDA at 1-800-FDA-1088. Where should I keep my medication? This medication is given in a hospital or clinic. It will not be stored at home. NOTE: This sheet is a summary. It may not cover all possible information. If you have questions about this medicine, talk to your doctor, pharmacist, or health care provider.  2023 Elsevier/Gold Standard (2022-05-05 00:00:00)

## 2022-09-28 NOTE — Progress Notes (Signed)
[place] Telephone:([phone]) [phone]   Fax:([phone]) [phone]  OFFICE PROGRESS NOTE  [name], NP [place] [address]  DIAGNOSIS: Metastatic small cell lung cancer initially diagnosed as Limited stage (T1c, N1, M1a) small cell lung cancer presented with left upper lobe lung nodule as well as left hilar lymphadenopathy as well as right upper lobe lung nodule diagnosed in February 2023  PRIOR THERAPY: Systemic chemotherapy with cisplatin 80 Mg/M2 on day 1 and 2 etoposide 100 Mg/M2 on days 1, 2 and 3 concurrent with radiotherapy.  First dose February 17, 2022.  Status post 4 cycles. Starting from cycle #2 his dose of cisplatin was reduced to 60 Mg/M2 on day 1 and etoposide reduced to 80 Mg/M2 on days 1, 2 and 3 every 3 weeks secondary to intolerance.  Last dose was giving July 14, 2022 discontinued after disease progression.  CURRENT THERAPY: Zepzelca (lurbinectedin) 2.6 Mg/M2 every 3 weeks.  First dose September 07, 2022.  INTERVAL HISTORY: Mark Yu 66 y.o. male returns to the clinic today for follow-up visit.  The patient is feeling much better today with no concerning complaints except for fatigue that lasted for several days after the first dose of his treatment with Zepzelca (lurbinectedin).  He also had chemotherapy-induced neutropenia and treated with filgrastim injection for 3 days.  He denied having any current chest pain, shortness of breath except with exertion with mild cough and no hemoptysis.  He has no nausea, vomiting, diarrhea or constipation.  He has no headache or visual changes.  He is here today for evaluation before starting cycle #2 of his treatment.  MEDICAL HISTORY: Past Medical History:  Diagnosis Date   Anxiety    tx xanax   COPD (chronic obstructive pulmonary disease) ([place])    tx inhalers   Depression    Dyspnea    with exertion   HTN (hypertension)    tx amlodipine   Hyponatremia 03/08/2022    ALLERGIES:   is allergic to amoxicillin and macrolides and ketolides.  MEDICATIONS:  Current Outpatient Medications  Medication Sig Dispense Refill   albuterol (PROVENTIL) (2.5 MG/3ML) 0.083% nebulizer solution Take 2.5 mg by nebulization every 6 (six) hours as needed for wheezing or shortness of breath.     albuterol (VENTOLIN HFA) 108 (90 Base) MCG/ACT inhaler Inhale 2 puffs into the lungs every 4 (four) hours as needed for wheezing or shortness of breath.     ALPRAZolam (XANAX) 1 MG tablet TAKE 1/2 TO 1 TABLET BY MOUTH TWO TIMES A DAY AS NEEDED FOR ANXIETY 30 tablet 0   amLODipine (NORVASC) 5 MG tablet Take 5 mg by mouth daily.     BREZTRI AEROSPHERE 160-9-4.8 MCG/ACT AERO Inhale 2 puffs into the lungs in the morning and at bedtime.     dextromethorphan-guaiFENesin (ROBITUSSIN-DM) 10-100 MG/5ML liquid Take 10 mLs by mouth 2 (two) times daily.     fluticasone (FLONASE) 50 MCG/ACT nasal spray Place 2 sprays into both nostrils daily as needed for allergies or rhinitis.     lidocaine-prilocaine (EMLA) cream Apply to the Port-A-Cath site 30-60 minutes before chemotherapy (Patient not taking: Reported on 06/11/2022) 30 g 0   Nutritional Supplements (FEEDING SUPPLEMENT, OSMOLITE 1.5 CAL,) LIQD Place 1,000 mLs into feeding tube continuous.  0   pantoprazole (PROTONIX) 40 MG tablet Take 40 mg by mouth daily.     pantoprazole sodium (PROTONIX) 40 mg Place 40 mg into feeding tube daily. 30 packet 3   predniSONE (  DELTASONE) 10 MG tablet TAKE TWO TABLETS BY MOUTH DAILY WITH BREAKFAST 180 tablet 0   prochlorperazine (COMPAZINE) 10 MG tablet Take 1 tablet (10 mg total) by mouth every 6 (six) hours as needed for nausea or vomiting. (Patient not taking: Reported on 06/23/2022) 30 tablet 0   Water For Irrigation, Sterile (FREE WATER) SOLN Place 150 mLs into feeding tube 6 (six) times daily. 30 mL 3   No current facility-administered medications for this visit.    SURGICAL HISTORY:  Past Surgical History:  Procedure  Laterality Date   BRONCHIAL BIOPSY  02/02/2022   Procedure: BRONCHIAL BIOPSIES;  Surgeon: Collene Gobble, MD;  Location: [facility] ENDOSCOPY;  Service: Pulmonary;;   BRONCHIAL BRUSHINGS  02/02/2022   Procedure: BRONCHIAL BRUSHINGS;  Surgeon: Collene Gobble, MD;  Location: [facility] ENDOSCOPY;  Service: Pulmonary;;   BRONCHIAL NEEDLE ASPIRATION BIOPSY  02/02/2022   Procedure: BRONCHIAL NEEDLE ASPIRATION BIOPSIES;  Surgeon: Collene Gobble, MD;  Location: [facility] ENDOSCOPY;  Service: Pulmonary;;   IR GASTROSTOMY TUBE MOD SED  04/03/2022   IR IMAGING GUIDED PORT INSERTION  02/24/2022   MRI     NO PAST SURGERIES     VIDEO BRONCHOSCOPY WITH ENDOBRONCHIAL ULTRASOUND N/A 02/02/2022   Procedure: VIDEO BRONCHOSCOPY WITH ENDOBRONCHIAL ULTRASOUND;  Surgeon: Collene Gobble, MD;  Location: [facility] ENDOSCOPY;  Service: Pulmonary;  Laterality: N/A;   VIDEO BRONCHOSCOPY WITH RADIAL ENDOBRONCHIAL ULTRASOUND  02/02/2022   Procedure: VIDEO BRONCHOSCOPY WITH RADIAL ENDOBRONCHIAL ULTRASOUND;  Surgeon: Collene Gobble, MD;  Location: [facility] ENDOSCOPY;  Service: Pulmonary;;    REVIEW OF SYSTEMS:  A comprehensive review of systems was negative except for: Constitutional: positive for fatigue Respiratory: positive for cough and dyspnea on exertion   PHYSICAL EXAMINATION: General appearance: alert, cooperative, fatigued, and no distress Head: Normocephalic, without obvious abnormality, atraumatic Neck: no adenopathy, no JVD, supple, symmetrical, trachea midline, and thyroid not enlarged, symmetric, no tenderness/mass/nodules Lymph nodes: Cervical, supraclavicular, and axillary nodes normal. Resp: clear to auscultation bilaterally Back: symmetric, no curvature. ROM normal. No CVA tenderness. Cardio: regular rate and rhythm, S1, S2 normal, no murmur, click, rub or gallop GI: soft, non-tender; bowel sounds normal; no masses,  no organomegaly Extremities: extremities normal, atraumatic, no cyanosis or edema  ECOG PERFORMANCE STATUS: 1 - Symptomatic but  completely ambulatory  Blood pressure 131/81, pulse 95, temperature 97.7 F (36.5 C), temperature source Oral, resp. rate 16, weight 117 lb 7 oz (53.3 kg), SpO2 95 %.  LABORATORY DATA: Lab Results  Component Value Date   WBC 1.4 (L) 09/22/2022   HGB 10.2 (L) 09/22/2022   HCT 30.9 (L) 09/22/2022   MCV 100.0 09/22/2022   PLT 155 09/22/2022      Chemistry      Component Value Date/Time   NA 139 09/22/2022 0846   K 3.6 09/22/2022 0846   CL 102 09/22/2022 0846   CO2 31 09/22/2022 0846   BUN 9 09/22/2022 0846   CREATININE 0.55 (L) 09/22/2022 0846      Component Value Date/Time   CALCIUM 8.9 09/22/2022 0846   ALKPHOS 42 09/22/2022 0846   AST 12 (L) 09/22/2022 0846   ALT 10 09/22/2022 0846   BILITOT 0.4 09/22/2022 0846       RADIOGRAPHIC STUDIES: MR Brain W Wo Contrast  Result Date: 09/08/2022 CLINICAL DATA:  Small cell lung cancer staging EXAM: MRI HEAD WITHOUT AND WITH CONTRAST TECHNIQUE: Multiplanar, multiecho pulse sequences of the brain and surrounding structures were obtained without and with intravenous contrast. CONTRAST:  40mL  GADAVIST GADOBUTROL 1 MMOL/ML IV SOLN COMPARISON:  01/20/2022 FINDINGS: Brain: No abnormal enhancement or swelling to suggest metastatic disease. No acute or subacute infarction, hemorrhage, hydrocephalus, extra-axial collection or mass lesion. Remote hemorrhagic insults in the bilateral putamen with hemosiderin staining. Mild chronic small vessel ischemic type change in the cerebral white matter and pons. Preserved brain volume. Vascular: Major flow voids and vascular enhancements are preserved. Skull and upper cervical spine: Normal marrow signal Sinuses/Orbits: Negative IMPRESSION: Negative for metastatic disease to the brain. Electronically Signed   By: Jorje Guild M.D.   On: 09/08/2022 07:51    ASSESSMENT AND PLAN: This is a very pleasant 66 years old white male recently diagnosed with small cell lung cancer likely extensive stage (T1c, N1, M1a)  disease diagnosed and February 2023 with the bilateral nodules in the right upper lobe as well as the left upper lobe but this could be also treated as limited stage disease because of the proximity of the lesions. The patient underwent a course of systemic chemotherapy with cisplatin 80 Mg/M2 on day 1 and etoposide 100 Mg/M2 on days 1, 2 and 3 every 3.  Status post 4 cycles.  The first cycle of his treatment well with no concerning adverse effect but unfortunately he was admitted 2 weeks later with respiratory failure and multifocal pneumonia as well as radiation pneumonitis.  The patient had prolonged hospitalization for treatment of his condition but he is finally at home after rehabilitation. Starting from cycle #2 his dose of cisplatin was reduced to 60 Mg/M2 on day 1 and 2 etoposide reduced to 80 Mg/M2 on days 1, 2 and 3 every 3 weeks. The patient is feeling fine today with no concerning complaints except for the baseline fatigue. His scan showed significant improvement in the lung lesions but there is some concerning hypodense lesion in the left hepatic lobe concerning for metastasis.  Bilobar hepatic lesions with imaging characteristic of liver metastasis. Unfortunately patient has relapsed disease in a very short period after the induction systemic chemotherapy. The patient understand his poor prognosis especially with the relapsed disease and short interval after the induction systemic chemotherapy. He is currently undergoing second line treatment with chemotherapy with Zepzelca (lurbinectedin) 2.6 Mg/M2 every 3 weeks.  First dose on 09/08/2022 status post 1 cycle. The patient tolerated the first cycle of his treatment fairly well except for fatigue and chemotherapy-induced neutropenia treated with filgrastim injection. I recommended for him to proceed with cycle #2 today as planned. I will see him back for follow-up visit in 3 weeks for evaluation before the next cycle of his treatment. The  patient was advised to call immediately if he has any other concerning symptoms in the interval. The patient voices understanding of current disease status and treatment options and is in agreement with the current care plan.  All questions were answered. The patient knows to call the clinic with any problems, questions or concerns. We can certainly see the patient much sooner if necessary.   Disclaimer: This note was dictated with voice recognition software. Similar sounding words can inadvertently be transcribed and may not be corrected upon review.

## 2022-09-29 ENCOUNTER — Other Ambulatory Visit: Payer: Self-pay

## 2022-09-30 ENCOUNTER — Other Ambulatory Visit: Payer: Self-pay

## 2022-10-01 ENCOUNTER — Other Ambulatory Visit: Payer: Self-pay

## 2022-10-01 DIAGNOSIS — C3412 Malignant neoplasm of upper lobe, left bronchus or lung: Secondary | ICD-10-CM

## 2022-10-02 ENCOUNTER — Telehealth: Payer: Self-pay | Admitting: Internal Medicine

## 2022-10-02 NOTE — Telephone Encounter (Signed)
Called patient regarding upcoming October and November appointments, left a voicemail.

## 2022-10-05 ENCOUNTER — Inpatient Hospital Stay: Payer: Medicare Other

## 2022-10-05 DIAGNOSIS — Z5111 Encounter for antineoplastic chemotherapy: Secondary | ICD-10-CM | POA: Diagnosis not present

## 2022-10-05 DIAGNOSIS — C3412 Malignant neoplasm of upper lobe, left bronchus or lung: Secondary | ICD-10-CM

## 2022-10-05 LAB — CBC WITH DIFFERENTIAL (CANCER CENTER ONLY)
Abs Immature Granulocytes: 0.02 10*3/uL (ref 0.00–0.07)
Basophils Absolute: 0 10*3/uL (ref 0.0–0.1)
Basophils Relative: 0 %
Eosinophils Absolute: 0 10*3/uL (ref 0.0–0.5)
Eosinophils Relative: 0 %
HCT: 29.6 % — ABNORMAL LOW (ref 39.0–52.0)
Hemoglobin: 9.9 g/dL — ABNORMAL LOW (ref 13.0–17.0)
Immature Granulocytes: 1 %
Lymphocytes Relative: 11 %
Lymphs Abs: 0.4 10*3/uL — ABNORMAL LOW (ref 0.7–4.0)
MCH: 33 pg (ref 26.0–34.0)
MCHC: 33.4 g/dL (ref 30.0–36.0)
MCV: 98.7 fL (ref 80.0–100.0)
Monocytes Absolute: 0 10*3/uL — ABNORMAL LOW (ref 0.1–1.0)
Monocytes Relative: 1 %
Neutro Abs: 3.1 10*3/uL (ref 1.7–7.7)
Neutrophils Relative %: 87 %
Platelet Count: 104 10*3/uL — ABNORMAL LOW (ref 150–400)
RBC: 3 MIL/uL — ABNORMAL LOW (ref 4.22–5.81)
RDW: 17.5 % — ABNORMAL HIGH (ref 11.5–15.5)
WBC Count: 3.5 10*3/uL — ABNORMAL LOW (ref 4.0–10.5)
nRBC: 0 % (ref 0.0–0.2)

## 2022-10-05 LAB — CMP (CANCER CENTER ONLY)
ALT: 11 U/L (ref 0–44)
AST: 12 U/L — ABNORMAL LOW (ref 15–41)
Albumin: 4.1 g/dL (ref 3.5–5.0)
Alkaline Phosphatase: 48 U/L (ref 38–126)
Anion gap: 5 (ref 5–15)
BUN: 14 mg/dL (ref 8–23)
CO2: 31 mmol/L (ref 22–32)
Calcium: 8.9 mg/dL (ref 8.9–10.3)
Chloride: 101 mmol/L (ref 98–111)
Creatinine: 0.49 mg/dL — ABNORMAL LOW (ref 0.61–1.24)
GFR, Estimated: >60 mL/min (ref >60)
Glucose, Bld: 114 mg/dL — ABNORMAL HIGH (ref 70–99)
Potassium: 3.8 mmol/L (ref 3.5–5.1)
Sodium: 137 mmol/L (ref 135–145)
Total Bilirubin: 0.6 mg/dL (ref 0.3–1.2)
Total Protein: 6.6 g/dL (ref 6.5–8.1)

## 2022-10-07 ENCOUNTER — Other Ambulatory Visit: Payer: Self-pay

## 2022-10-08 ENCOUNTER — Other Ambulatory Visit: Payer: Self-pay

## 2022-10-08 DIAGNOSIS — C3412 Malignant neoplasm of upper lobe, left bronchus or lung: Secondary | ICD-10-CM

## 2022-10-12 ENCOUNTER — Telehealth: Payer: Self-pay | Admitting: Medical Oncology

## 2022-10-12 ENCOUNTER — Inpatient Hospital Stay: Payer: Medicare Other

## 2022-10-12 DIAGNOSIS — C3412 Malignant neoplasm of upper lobe, left bronchus or lung: Secondary | ICD-10-CM

## 2022-10-12 DIAGNOSIS — Z5111 Encounter for antineoplastic chemotherapy: Secondary | ICD-10-CM | POA: Diagnosis not present

## 2022-10-12 DIAGNOSIS — Z95828 Presence of other vascular implants and grafts: Secondary | ICD-10-CM

## 2022-10-12 LAB — CBC WITH DIFFERENTIAL (CANCER CENTER ONLY)
Abs Immature Granulocytes: 0.01 10*3/uL (ref 0.00–0.07)
Basophils Absolute: 0 10*3/uL (ref 0.0–0.1)
Basophils Relative: 0 %
Eosinophils Absolute: 0 10*3/uL (ref 0.0–0.5)
Eosinophils Relative: 2 %
HCT: 29.7 % — ABNORMAL LOW (ref 39.0–52.0)
Hemoglobin: 9.8 g/dL — ABNORMAL LOW (ref 13.0–17.0)
Immature Granulocytes: 1 %
Lymphocytes Relative: 26 %
Lymphs Abs: 0.5 10*3/uL — ABNORMAL LOW (ref 0.7–4.0)
MCH: 33.6 pg (ref 26.0–34.0)
MCHC: 33 g/dL (ref 30.0–36.0)
MCV: 101.7 fL — ABNORMAL HIGH (ref 80.0–100.0)
Monocytes Absolute: 0.5 10*3/uL (ref 0.1–1.0)
Monocytes Relative: 25 %
Neutro Abs: 0.9 10*3/uL — ABNORMAL LOW (ref 1.7–7.7)
Neutrophils Relative %: 46 %
Platelet Count: 196 10*3/uL (ref 150–400)
RBC: 2.92 MIL/uL — ABNORMAL LOW (ref 4.22–5.81)
RDW: 18.8 % — ABNORMAL HIGH (ref 11.5–15.5)
WBC Count: 1.9 10*3/uL — ABNORMAL LOW (ref 4.0–10.5)
nRBC: 0 % (ref 0.0–0.2)

## 2022-10-12 LAB — COMPREHENSIVE METABOLIC PANEL
ALT: 9 U/L (ref 0–44)
AST: 11 U/L — ABNORMAL LOW (ref 15–41)
Albumin: 3.9 g/dL (ref 3.5–5.0)
Alkaline Phosphatase: 55 U/L (ref 38–126)
Anion gap: 8 (ref 5–15)
BUN: 10 mg/dL (ref 8–23)
CO2: 28 mmol/L (ref 22–32)
Calcium: 8.8 mg/dL — ABNORMAL LOW (ref 8.9–10.3)
Chloride: 103 mmol/L (ref 98–111)
Creatinine, Ser: 0.54 mg/dL — ABNORMAL LOW (ref 0.61–1.24)
GFR, Estimated: >60 mL/min (ref >60)
Glucose, Bld: 117 mg/dL — ABNORMAL HIGH (ref 70–99)
Potassium: 3.3 mmol/L — ABNORMAL LOW (ref 3.5–5.1)
Sodium: 139 mmol/L (ref 135–145)
Total Bilirubin: 0.4 mg/dL (ref 0.3–1.2)
Total Protein: 6.1 g/dL — ABNORMAL LOW (ref 6.5–8.1)

## 2022-10-12 MED ORDER — SODIUM CHLORIDE 0.9% FLUSH
10.0000 mL | Freq: Once | INTRAVENOUS | Status: AC
  Administered 2022-10-12: 10 mL

## 2022-10-12 MED ORDER — HEPARIN SOD (PORK) LOCK FLUSH 100 UNIT/ML IV SOLN
500.0000 [IU] | Freq: Once | INTRAVENOUS | Status: AC
  Administered 2022-10-12: 500 [IU]

## 2022-10-12 NOTE — Telephone Encounter (Signed)
Tired , he noticed his HGB " is low". Concerned about treatment next Monday . I told him we will check his blood counts before his next treatment . Reviewed Neutropenic  precautions.

## 2022-10-16 ENCOUNTER — Other Ambulatory Visit: Payer: Self-pay

## 2022-10-16 DIAGNOSIS — C3412 Malignant neoplasm of upper lobe, left bronchus or lung: Secondary | ICD-10-CM

## 2022-10-16 MED FILL — Dexamethasone Sodium Phosphate Inj 100 MG/10ML: INTRAMUSCULAR | Qty: 1 | Status: AC

## 2022-10-19 ENCOUNTER — Other Ambulatory Visit: Payer: Self-pay | Admitting: Internal Medicine

## 2022-10-19 ENCOUNTER — Inpatient Hospital Stay: Payer: Medicare Other

## 2022-10-19 ENCOUNTER — Inpatient Hospital Stay (HOSPITAL_BASED_OUTPATIENT_CLINIC_OR_DEPARTMENT_OTHER): Payer: Medicare Other | Admitting: Internal Medicine

## 2022-10-19 ENCOUNTER — Encounter: Payer: Self-pay | Admitting: Internal Medicine

## 2022-10-19 ENCOUNTER — Other Ambulatory Visit: Payer: Medicare Other

## 2022-10-19 VITALS — BP 118/71 | HR 79 | Resp 18

## 2022-10-19 VITALS — BP 126/82 | HR 104 | Temp 98.0°F | Resp 17 | Wt 116.3 lb

## 2022-10-19 DIAGNOSIS — C3412 Malignant neoplasm of upper lobe, left bronchus or lung: Secondary | ICD-10-CM

## 2022-10-19 DIAGNOSIS — C3490 Malignant neoplasm of unspecified part of unspecified bronchus or lung: Secondary | ICD-10-CM | POA: Diagnosis not present

## 2022-10-19 DIAGNOSIS — Z5111 Encounter for antineoplastic chemotherapy: Secondary | ICD-10-CM | POA: Diagnosis not present

## 2022-10-19 LAB — CMP (CANCER CENTER ONLY)
ALT: 14 U/L (ref 0–44)
AST: 22 U/L (ref 15–41)
Albumin: 3.9 g/dL (ref 3.5–5.0)
Alkaline Phosphatase: 57 U/L (ref 38–126)
Anion gap: 7 (ref 5–15)
BUN: 14 mg/dL (ref 8–23)
CO2: 29 mmol/L (ref 22–32)
Calcium: 8.8 mg/dL — ABNORMAL LOW (ref 8.9–10.3)
Chloride: 102 mmol/L (ref 98–111)
Creatinine: 0.64 mg/dL (ref 0.61–1.24)
GFR, Estimated: >60 mL/min (ref >60)
Glucose, Bld: 114 mg/dL — ABNORMAL HIGH (ref 70–99)
Potassium: 3.4 mmol/L — ABNORMAL LOW (ref 3.5–5.1)
Sodium: 138 mmol/L (ref 135–145)
Total Bilirubin: 0.4 mg/dL (ref 0.3–1.2)
Total Protein: 6.3 g/dL — ABNORMAL LOW (ref 6.5–8.1)

## 2022-10-19 LAB — CBC WITH DIFFERENTIAL (CANCER CENTER ONLY)
Abs Immature Granulocytes: 0.35 10*3/uL — ABNORMAL HIGH (ref 0.00–0.07)
Basophils Absolute: 0 10*3/uL (ref 0.0–0.1)
Basophils Relative: 1 %
Eosinophils Absolute: 0 10*3/uL (ref 0.0–0.5)
Eosinophils Relative: 0 %
HCT: 35.7 % — ABNORMAL LOW (ref 39.0–52.0)
Hemoglobin: 11.6 g/dL — ABNORMAL LOW (ref 13.0–17.0)
Immature Granulocytes: 5 %
Lymphocytes Relative: 13 %
Lymphs Abs: 1 10*3/uL (ref 0.7–4.0)
MCH: 33 pg (ref 26.0–34.0)
MCHC: 32.5 g/dL (ref 30.0–36.0)
MCV: 101.4 fL — ABNORMAL HIGH (ref 80.0–100.0)
Monocytes Absolute: 1.2 10*3/uL — ABNORMAL HIGH (ref 0.1–1.0)
Monocytes Relative: 16 %
Neutro Abs: 4.8 10*3/uL (ref 1.7–7.7)
Neutrophils Relative %: 65 %
Platelet Count: 299 10*3/uL (ref 150–400)
RBC: 3.52 MIL/uL — ABNORMAL LOW (ref 4.22–5.81)
RDW: 18.4 % — ABNORMAL HIGH (ref 11.5–15.5)
WBC Count: 7.4 10*3/uL (ref 4.0–10.5)
nRBC: 0 % (ref 0.0–0.2)

## 2022-10-19 MED ORDER — SODIUM CHLORIDE 0.9 % IV SOLN: Freq: Once | INTRAVENOUS | Status: AC

## 2022-10-19 MED ORDER — PALONOSETRON HCL INJECTION 0.25 MG/5ML
0.2500 mg | Freq: Once | INTRAVENOUS | Status: AC
  Administered 2022-10-19: 0.25 mg via INTRAVENOUS
  Filled 2022-10-19: qty 5

## 2022-10-19 MED ORDER — HEPARIN SOD (PORK) LOCK FLUSH 100 UNIT/ML IV SOLN
500.0000 [IU] | Freq: Once | INTRAVENOUS | Status: AC | PRN
  Administered 2022-10-19: 500 [IU]

## 2022-10-19 MED ORDER — SODIUM CHLORIDE 0.9 % IV SOLN
2.4400 mg/m2 | Freq: Once | INTRAVENOUS | Status: AC
  Administered 2022-10-19: 4 mg via INTRAVENOUS
  Filled 2022-10-19: qty 8

## 2022-10-19 MED ORDER — SODIUM CHLORIDE 0.9% FLUSH
10.0000 mL | INTRAVENOUS | Status: DC | PRN
  Administered 2022-10-19: 10 mL

## 2022-10-19 MED ORDER — SODIUM CHLORIDE 0.9 % IV SOLN
10.0000 mg | Freq: Once | INTRAVENOUS | Status: AC
  Administered 2022-10-19: 10 mg via INTRAVENOUS
  Filled 2022-10-19: qty 10

## 2022-10-19 NOTE — Progress Notes (Signed)
[place] [phone]   Fax:(336) [phone]  OFFICE PROGRESS NOTE  [name], NP [place] [address]  DIAGNOSIS: Metastatic small cell lung cancer initially diagnosed as Limited stage (T1c, N1, M1a) small cell lung cancer presented with left upper lobe lung nodule as well as left hilar lymphadenopathy as well as right upper lobe lung nodule diagnosed in February 2023  PRIOR THERAPY: Systemic chemotherapy with cisplatin 80 Mg/M2 on day 1 and 2 etoposide 100 Mg/M2 on days 1, 2 and 3 concurrent with radiotherapy.  First dose February 17, 2022.  Status post 4 cycles. Starting from cycle #2 his dose of cisplatin was reduced to 60 Mg/M2 on day 1 and etoposide reduced to 80 Mg/M2 on days 1, 2 and 3 every 3 weeks secondary to intolerance.  Last dose was giving July 14, 2022 discontinued after disease progression.  CURRENT THERAPY: Zepzelca (lurbinectedin) 2.6 Mg/M2 every 3 weeks.  First dose September 07, 2022.  Status post 2 cycles.  INTERVAL HISTORY: Mark Yu 66 y.o. male returns to the clinic today for follow-up visit.  The patient is feeling fine today with no concerning complaints except for fatigue and cough productive of thick mucus.  He has a cocktail of coffee, whiskey as well as honey and lemon and and that does help him a little bit.  He denied having any chest pain but has shortness of breath with exertion with no hemoptysis.  He has no nausea, vomiting, diarrhea or constipation.  He has no headache or visual changes.  He is here today for evaluation before starting cycle #3 of his treatment.  MEDICAL HISTORY: Past Medical History:  Diagnosis Date   Anxiety    tx xanax   COPD (chronic obstructive pulmonary disease) ([place])    tx inhalers   Depression    Dyspnea    with exertion   HTN (hypertension)    tx amlodipine   Hyponatremia 03/08/2022    ALLERGIES:  is allergic to amoxicillin and macrolides and  ketolides.  MEDICATIONS:  Current Outpatient Medications  Medication Sig Dispense Refill   albuterol (PROVENTIL) (2.5 MG/3ML) 0.083% nebulizer solution Take 2.5 mg by nebulization every 6 (six) hours as needed for wheezing or shortness of breath.     albuterol (VENTOLIN HFA) 108 (90 Base) MCG/ACT inhaler Inhale 2 puffs into the lungs every 4 (four) hours as needed for wheezing or shortness of breath.     ALPRAZolam (XANAX) 1 MG tablet TAKE 1/2 TO 1 TABLET BY MOUTH TWO TIMES A DAY AS NEEDED FOR ANXIETY 30 tablet 0   amLODipine (NORVASC) 5 MG tablet Take 5 mg by mouth daily.     BREZTRI AEROSPHERE 160-9-4.8 MCG/ACT AERO Inhale 2 puffs into the lungs in the morning and at bedtime.     dextromethorphan-guaiFENesin (ROBITUSSIN-DM) 10-100 MG/5ML liquid Take 10 mLs by mouth 2 (two) times daily.     fluticasone (FLONASE) 50 MCG/ACT nasal spray Place 2 sprays into both nostrils daily as needed for allergies or rhinitis.     lidocaine-prilocaine (EMLA) cream Apply to the Port-A-Cath site 30-60 minutes before chemotherapy (Patient not taking: Reported on 06/11/2022) 30 g 0   Nutritional Supplements (FEEDING SUPPLEMENT, OSMOLITE 1.5 CAL,) LIQD Place 1,000 mLs into feeding tube continuous.  0   pantoprazole (PROTONIX) 40 MG tablet Take 40 mg by mouth daily.     predniSONE (DELTASONE) 10 MG tablet TAKE TWO TABLETS BY MOUTH DAILY WITH BREAKFAST 180 tablet 0  prochlorperazine (COMPAZINE) 10 MG tablet Take 1 tablet (10 mg total) by mouth every 6 (six) hours as needed for nausea or vomiting. 30 tablet 0   Water For Irrigation, Sterile (FREE WATER) SOLN Place 150 mLs into feeding tube 6 (six) times daily. 30 mL 3   No current facility-administered medications for this visit.    SURGICAL HISTORY:  Past Surgical History:  Procedure Laterality Date   BRONCHIAL BIOPSY  02/02/2022   Procedure: BRONCHIAL BIOPSIES;  Surgeon: Collene Gobble, MD;  Location[facility] ENDOSCOPY;  Service: Pulmonary;;   BRONCHIAL BRUSHINGS   02/02/2022   Procedure: BRONCHIAL BRUSHINGS;  Surgeon: Collene Gobble, MD;  Location: [facility] ENDOSCOPY;  Service: Pulmonary;;   BRONCHIAL NEEDLE ASPIRATION BIOPSY  02/02/2022   Procedure: BRONCHIAL NEEDLE ASPIRATION BIOPSIES;  Surgeon: Collene Gobble, MD;  Location: [facility] ENDOSCOPY;  Service: Pulmonary;;   IR GASTROSTOMY TUBE MOD SED  04/03/2022   IR IMAGING GUIDED PORT INSERTION  02/24/2022   MRI     NO PAST SURGERIES     VIDEO BRONCHOSCOPY WITH ENDOBRONCHIAL ULTRASOUND N/A 02/02/2022   Procedure: VIDEO BRONCHOSCOPY WITH ENDOBRONCHIAL ULTRASOUND;  Surgeon: Collene Gobble, MD;  Location: [facility] ENDOSCOPY;  Service: Pulmonary;  Laterality: N/A;   VIDEO BRONCHOSCOPY WITH RADIAL ENDOBRONCHIAL ULTRASOUND  02/02/2022   Procedure: VIDEO BRONCHOSCOPY WITH RADIAL ENDOBRONCHIAL ULTRASOUND;  Surgeon: Collene Gobble, MD;  Location: [facility] ENDOSCOPY;  Service: Pulmonary;;    REVIEW OF SYSTEMS:  A comprehensive review of systems was negative except for: Constitutional: positive for fatigue Respiratory: positive for cough   PHYSICAL EXAMINATION: General appearance: alert, cooperative, fatigued, and no distress Head: Normocephalic, without obvious abnormality, atraumatic Neck: no adenopathy, no JVD, supple, symmetrical, trachea midline, and thyroid not enlarged, symmetric, no tenderness/mass/nodules Lymph nodes: Cervical, supraclavicular, and axillary nodes normal. Resp: clear to auscultation bilaterally Back: symmetric, no curvature. ROM normal. No CVA tenderness. Cardio: regular rate and rhythm, S1, S2 normal, no murmur, click, rub or gallop GI: soft, non-tender; bowel sounds normal; no masses,  no organomegaly Extremities: extremities normal, atraumatic, no cyanosis or edema  ECOG PERFORMANCE STATUS: 1 - Symptomatic but completely ambulatory  Blood pressure 126/82, pulse (!) 104, temperature 98 F (36.7 C), temperature source Oral, resp. rate 17, weight 116 lb 5 oz (52.8 kg), SpO2 96 %.  LABORATORY DATA: Lab Results   Component Value Date   WBC 7.4 10/19/2022   HGB 11.6 (L) 10/19/2022   HCT 35.7 (L) 10/19/2022   MCV 101.4 (H) 10/19/2022   PLT 299 10/19/2022      Chemistry      Component Value Date/Time   NA 138 10/19/2022 0907   K 3.4 (L) 10/19/2022 0907   CL 102 10/19/2022 0907   CO2 29 10/19/2022 0907   BUN 14 10/19/2022 0907   CREATININE 0.64 10/19/2022 0907      Component Value Date/Time   CALCIUM 8.8 (L) 10/19/2022 0907   ALKPHOS 57 10/19/2022 0907   AST 22 10/19/2022 0907   ALT 14 10/19/2022 0907   BILITOT 0.4 10/19/2022 0907       RADIOGRAPHIC STUDIES: No results found.  ASSESSMENT AND PLAN: This is a very pleasant 66 years old white male recently diagnosed with small cell lung cancer likely extensive stage (T1c, N1, M1a) disease diagnosed and February 2023 with the bilateral nodules in the right upper lobe as well as the left upper lobe but this could be also treated as limited stage disease because of the proximity of the lesions. The patient underwent  a course of systemic chemotherapy with cisplatin 80 Mg/M2 on day 1 and etoposide 100 Mg/M2 on days 1, 2 and 3 every 3.  Status post 4 cycles.  The first cycle of his treatment well with no concerning adverse effect but unfortunately he was admitted 2 weeks later with respiratory failure and multifocal pneumonia as well as radiation pneumonitis.  The patient had prolonged hospitalization for treatment of his condition but he is finally at home after rehabilitation. Starting from cycle #2 his dose of cisplatin was reduced to 60 Mg/M2 on day 1 and 2 etoposide reduced to 80 Mg/M2 on days 1, 2 and 3 every 3 weeks. The patient is feeling fine today with no concerning complaints except for the baseline fatigue. His scan showed significant improvement in the lung lesions but there is some concerning hypodense lesion in the left hepatic lobe concerning for metastasis.  Bilobar hepatic lesions with imaging characteristic of liver  metastasis. Unfortunately patient has relapsed disease in a very short period after the induction systemic chemotherapy. The patient understand his poor prognosis especially with the relapsed disease and short interval after the induction systemic chemotherapy. He is currently undergoing second line treatment with chemotherapy with Zepzelca (lurbinectedin) 2.6 Mg/M2 every 3 weeks.  First dose on 09/08/2022 status post 2 cycles. The patient has been tolerating this treatment well with no concerning adverse effect except for fatigue. I recommended for him to proceed with cycle #3 today as planned. I will see him back for follow-up visit in 3 weeks for evaluation with repeat CT scan of the chest, abdomen and pelvis for restaging of his disease. The patient was advised to call immediately if he has any other concerning symptoms in the interval. The patient voices understanding of current disease status and treatment options and is in agreement with the current care plan.  All questions were answered. The patient knows to call the clinic with any problems, questions or concerns. We can certainly see the patient much sooner if necessary.   Disclaimer: This note was dictated with voice recognition software. Similar sounding words can inadvertently be transcribed and may not be corrected upon review.

## 2022-10-19 NOTE — Progress Notes (Signed)
Per Dr. Julien Nordmann ,it is ok to treat pt today with Lurbinected and heart rate of 104.

## 2022-10-19 NOTE — Patient Instructions (Signed)
[facility]  Discharge Instructions: Thank you for choosing [facility] to provide your oncology and hematology care.   If you have a lab appointment with the [facility], please go directly to the [facility] and check in at the registration area.   Wear comfortable clothing and clothing appropriate for easy access to any Portacath or PICC line.   We strive to give you quality time with your provider. You may need to reschedule your appointment if you arrive late (15 or more minutes).  Arriving late affects you and other patients whose appointments are after yours.  Also, if you miss three or more appointments without notifying the office, you may be dismissed from the clinic at the provider's discretion.      For prescription refill requests, have your pharmacy contact our office and allow 72 hours for refills to be completed.    Today you received the following chemotherapy and/or immunotherapy agents: lurbinectedin (Zepzelca)      To help prevent nausea and vomiting after your treatment, we encourage you to take your nausea medication as directed.  BELOW ARE SYMPTOMS THAT SHOULD BE REPORTED IMMEDIATELY: *FEVER GREATER THAN 100.4 F (38 C) OR HIGHER *CHILLS OR SWEATING *NAUSEA AND VOMITING THAT IS NOT CONTROLLED WITH YOUR NAUSEA MEDICATION *UNUSUAL SHORTNESS OF BREATH *UNUSUAL BRUISING OR BLEEDING *URINARY PROBLEMS (pain or burning when urinating, or frequent urination) *BOWEL PROBLEMS (unusual diarrhea, constipation, pain near the anus) TENDERNESS IN MOUTH AND THROAT WITH OR WITHOUT PRESENCE OF ULCERS (sore throat, sores in mouth, or a toothache) UNUSUAL RASH, SWELLING OR PAIN  UNUSUAL VAGINAL DISCHARGE OR ITCHING   Items with * indicate a potential emergency and should be followed up as soon as possible or go to the Emergency Department if any problems should occur.  Please show the CHEMOTHERAPY ALERT CARD or IMMUNOTHERAPY ALERT CARD  at check-in to the Emergency Department and triage nurse.  Should you have questions after your visit or need to cancel or reschedule your appointment, please contact [facility]  Dept: [phone]  and follow the prompts.  Office hours are 8:00 a.m. to 4:30 p.m. Monday - Friday. Please note that voicemails left after 4:00 p.m. may not be returned until the following business day.  We are closed weekends and major holidays. You have access to a nurse at all times for urgent questions. Please call the main number to the clinic Dept: [phone] and follow the prompts.   For any non-urgent questions, you may also contact your provider using MyChart. We now offer e-Visits for anyone 71 and older to request care online for non-urgent symptoms. For details visit [email or web address].   Also download the MyChart app! Go to the app store, search "MyChart", open the app, select [facility], and log in with your MyChart username and password.  Masks are optional in the cancer centers. If you would like for your care team to wear a mask while they are taking care of you, please let them know. You may have one support person who is at least 65 years old accompany you for your appointments.   Lurbinectedin Injection What is this medication? LURBINECTEDIN (LOOR bin EK te din) treats lung cancer. It works by slowing down the growth of cancer cells. This medicine may be used for other purposes; ask your health care provider or pharmacist if you have questions. COMMON BRAND NAME(S): ZEPZELCA What should I tell my care team before I take this  medication? They need to know if you have any of these conditions: Liver disease Low blood cell levels, such as low white cells, platelets, red blood cells An unusual or allergic reaction to lurbinectedin, other medications, foods, dyes, or preservatives If you or your partner are pregnant or trying to get pregnant Breastfeeding How  should I use this medication? This medication is injected into a vein. It is given by your care team in a hospital or clinic setting. Talk to your care team about the use of this medication in children. Special care may be needed. Overdosage: If you think you have taken too much of this medicine contact a poison control center or emergency room at once. NOTE: This medicine is only for you. Do not share this medicine with others. What if I miss a dose? Keep appointments for follow-up doses. It is important not to miss your dose. Call your care team if you are unable to keep an appointment. What may interact with this medication? Certain antibiotics, such as erythromycin, clarithromycin Certain antivirals for HIV or hepatitis Certain medications for fungal infections, such as ketoconazole, itraconazole, posaconazole Certain medications for seizures, such as carbamazepine, phenobarbital, phenytoin Grapefruit or grapefruit juice St. John's wort This list may not describe all possible interactions. Give your health care provider a list of all the medicines, herbs, non-prescription drugs, or dietary supplements you use. Also tell them if you smoke, drink alcohol, or use illegal drugs. Some items may interact with your medicine. What should I watch for while using this medication? Your condition will be monitored carefully while you are receiving this medication. This medication may make you feel generally unwell. This is not uncommon as chemotherapy can affect healthy cells as well as cancer cells. Report any side effects. Continue your course of treatment even though you feel ill unless your care team tells you to stop. This medication may increase your risk of getting an infection. Call your care team for advice if you get a fever, chills, sore throat, or other symptoms of a cold or flu. Do not treat yourself. Try to avoid being around people who are sick. Avoid taking medications that contain  aspirin, acetaminophen, ibuprofen, naproxen, or ketoprofen unless instructed by your care team. These medications may hide a fever. Be careful brushing or flossing your teeth or using a toothpick because you may get an infection or bleed more easily. If you have any dental work done, tell your dentist you are receiving this medication. Talk to your care team if you may be pregnant. Serious birth defects can occur if you take this medication during pregnancy and for 6 months after the last dose. You will need a negative pregnancy test before starting this medication. Contraception is recommended while taking this medication and for 6 months after the last dose. If your partner can get pregnant, use a condom during sex while taking this medication and for 4 months after the last dose. Do not breastfeed while taking this medication and for 2 weeks after the last dose. What side effects may I notice from receiving this medication? Side effects that you should report to your care team as soon as possible: Allergic reactions--skin rash, itching, hives, swelling of the face, lips, tongue, or throat Infection--fever, chills, cough, sore throat, wounds that don't heal, pain or trouble when passing urine, general feeling of discomfort or being unwell Liver injury--right upper belly pain, loss of appetite, nausea, light-colored stool, dark yellow or brown urine, yellowing skin or eyes, unusual  weakness or fatigue Low red blood cell level--unusual weakness or fatigue, dizziness, headache, trouble breathing Muscle injury--unusual weakness or fatigue, muscle pain, dark yellow or brown urine, decrease in amount of urine Painful swelling, warmth, or redness of the skin, blisters or sores at the infusion site Unusual bruising or bleeding Side effects that usually do not require medical attention (report these to your care team if they continue or are bothersome): Constipation Cough Diarrhea Fatigue Loss of  appetite Nausea This list may not describe all possible side effects. Call your doctor for medical advice about side effects. You may report side effects to FDA at 1-800-FDA-1088. Where should I keep my medication? This medication is given in a hospital or clinic. It will not be stored at home. NOTE: This sheet is a summary. It may not cover all possible information. If you have questions about this medicine, talk to your doctor, pharmacist, or health care provider.  2023 Elsevier/Gold Standard (2022-05-05 00:00:00)

## 2022-10-20 ENCOUNTER — Other Ambulatory Visit: Payer: Self-pay

## 2022-10-23 ENCOUNTER — Other Ambulatory Visit: Payer: Self-pay

## 2022-10-23 DIAGNOSIS — C3412 Malignant neoplasm of upper lobe, left bronchus or lung: Secondary | ICD-10-CM

## 2022-10-24 ENCOUNTER — Other Ambulatory Visit: Payer: Self-pay

## 2022-10-26 ENCOUNTER — Inpatient Hospital Stay: Payer: Medicare Other

## 2022-10-26 DIAGNOSIS — C3412 Malignant neoplasm of upper lobe, left bronchus or lung: Secondary | ICD-10-CM

## 2022-10-26 DIAGNOSIS — Z5111 Encounter for antineoplastic chemotherapy: Secondary | ICD-10-CM | POA: Diagnosis not present

## 2022-10-26 DIAGNOSIS — Z95828 Presence of other vascular implants and grafts: Secondary | ICD-10-CM

## 2022-10-26 LAB — CMP (CANCER CENTER ONLY)
ALT: 11 U/L (ref 0–44)
AST: 12 U/L — ABNORMAL LOW (ref 15–41)
Albumin: 3.9 g/dL (ref 3.5–5.0)
Alkaline Phosphatase: 48 U/L (ref 38–126)
Anion gap: 5 (ref 5–15)
BUN: 19 mg/dL (ref 8–23)
CO2: 29 mmol/L (ref 22–32)
Calcium: 8.9 mg/dL (ref 8.9–10.3)
Chloride: 104 mmol/L (ref 98–111)
Creatinine: 0.56 mg/dL — ABNORMAL LOW (ref 0.61–1.24)
GFR, Estimated: >60 mL/min (ref >60)
Glucose, Bld: 101 mg/dL — ABNORMAL HIGH (ref 70–99)
Potassium: 3.9 mmol/L (ref 3.5–5.1)
Sodium: 138 mmol/L (ref 135–145)
Total Bilirubin: 0.3 mg/dL (ref 0.3–1.2)
Total Protein: 6.4 g/dL — ABNORMAL LOW (ref 6.5–8.1)

## 2022-10-26 LAB — CBC WITH DIFFERENTIAL (CANCER CENTER ONLY)
Abs Immature Granulocytes: 0.03 10*3/uL (ref 0.00–0.07)
Basophils Absolute: 0 10*3/uL (ref 0.0–0.1)
Basophils Relative: 0 %
Eosinophils Absolute: 0 10*3/uL (ref 0.0–0.5)
Eosinophils Relative: 0 %
HCT: 29.9 % — ABNORMAL LOW (ref 39.0–52.0)
Hemoglobin: 9.8 g/dL — ABNORMAL LOW (ref 13.0–17.0)
Immature Granulocytes: 1 %
Lymphocytes Relative: 5 %
Lymphs Abs: 0.3 10*3/uL — ABNORMAL LOW (ref 0.7–4.0)
MCH: 32.7 pg (ref 26.0–34.0)
MCHC: 32.8 g/dL (ref 30.0–36.0)
MCV: 99.7 fL (ref 80.0–100.0)
Monocytes Absolute: 0.1 10*3/uL (ref 0.1–1.0)
Monocytes Relative: 1 %
Neutro Abs: 5.7 10*3/uL (ref 1.7–7.7)
Neutrophils Relative %: 93 %
Platelet Count: 125 10*3/uL — ABNORMAL LOW (ref 150–400)
RBC: 3 MIL/uL — ABNORMAL LOW (ref 4.22–5.81)
RDW: 17 % — ABNORMAL HIGH (ref 11.5–15.5)
WBC Count: 6.2 10*3/uL (ref 4.0–10.5)
nRBC: 0 % (ref 0.0–0.2)

## 2022-10-26 MED ORDER — HEPARIN SOD (PORK) LOCK FLUSH 100 UNIT/ML IV SOLN
500.0000 [IU] | Freq: Once | INTRAVENOUS | Status: AC
  Administered 2022-10-26: 500 [IU]

## 2022-10-26 MED ORDER — SODIUM CHLORIDE 0.9% FLUSH
10.0000 mL | Freq: Once | INTRAVENOUS | Status: AC
  Administered 2022-10-26: 10 mL

## 2022-10-27 ENCOUNTER — Telehealth: Payer: Self-pay | Admitting: Medical Oncology

## 2022-10-27 DIAGNOSIS — C3412 Malignant neoplasm of upper lobe, left bronchus or lung: Secondary | ICD-10-CM

## 2022-10-27 DIAGNOSIS — R632 Polyphagia: Secondary | ICD-10-CM

## 2022-10-27 NOTE — Telephone Encounter (Signed)
G-tube removal. Pt wants his G-tube  removed. He stated he eating well. He is not using his G-tube anymore. " It is a nuisance". His wt is holding at 117 lbs.  He wants it done ASAP .  Dr Julien Nordmann notified.

## 2022-11-02 ENCOUNTER — Inpatient Hospital Stay: Payer: Medicare Other | Attending: Internal Medicine

## 2022-11-02 DIAGNOSIS — C3411 Malignant neoplasm of upper lobe, right bronchus or lung: Secondary | ICD-10-CM | POA: Diagnosis present

## 2022-11-02 DIAGNOSIS — R5383 Other fatigue: Secondary | ICD-10-CM | POA: Insufficient documentation

## 2022-11-02 DIAGNOSIS — F17210 Nicotine dependence, cigarettes, uncomplicated: Secondary | ICD-10-CM | POA: Diagnosis not present

## 2022-11-02 DIAGNOSIS — I10 Essential (primary) hypertension: Secondary | ICD-10-CM | POA: Insufficient documentation

## 2022-11-02 DIAGNOSIS — R0600 Dyspnea, unspecified: Secondary | ICD-10-CM | POA: Diagnosis not present

## 2022-11-02 DIAGNOSIS — Z95828 Presence of other vascular implants and grafts: Secondary | ICD-10-CM

## 2022-11-02 DIAGNOSIS — R490 Dysphonia: Secondary | ICD-10-CM | POA: Diagnosis not present

## 2022-11-02 DIAGNOSIS — T451X5D Adverse effect of antineoplastic and immunosuppressive drugs, subsequent encounter: Secondary | ICD-10-CM | POA: Diagnosis not present

## 2022-11-02 DIAGNOSIS — D6481 Anemia due to antineoplastic chemotherapy: Secondary | ICD-10-CM | POA: Diagnosis not present

## 2022-11-02 DIAGNOSIS — Z5111 Encounter for antineoplastic chemotherapy: Secondary | ICD-10-CM | POA: Diagnosis present

## 2022-11-02 DIAGNOSIS — Z79899 Other long term (current) drug therapy: Secondary | ICD-10-CM | POA: Diagnosis not present

## 2022-11-02 DIAGNOSIS — R059 Cough, unspecified: Secondary | ICD-10-CM | POA: Diagnosis not present

## 2022-11-02 DIAGNOSIS — Z7952 Long term (current) use of systemic steroids: Secondary | ICD-10-CM | POA: Diagnosis not present

## 2022-11-02 DIAGNOSIS — C3412 Malignant neoplasm of upper lobe, left bronchus or lung: Secondary | ICD-10-CM | POA: Diagnosis present

## 2022-11-02 LAB — CBC WITH DIFFERENTIAL (CANCER CENTER ONLY)
Abs Immature Granulocytes: 0.02 10*3/uL (ref 0.00–0.07)
Basophils Absolute: 0 10*3/uL (ref 0.0–0.1)
Basophils Relative: 0 %
Eosinophils Absolute: 0 10*3/uL (ref 0.0–0.5)
Eosinophils Relative: 1 %
HCT: 31.8 % — ABNORMAL LOW (ref 39.0–52.0)
Hemoglobin: 10.3 g/dL — ABNORMAL LOW (ref 13.0–17.0)
Immature Granulocytes: 1 %
Lymphocytes Relative: 12 %
Lymphs Abs: 0.3 10*3/uL — ABNORMAL LOW (ref 0.7–4.0)
MCH: 32.9 pg (ref 26.0–34.0)
MCHC: 32.4 g/dL (ref 30.0–36.0)
MCV: 101.6 fL — ABNORMAL HIGH (ref 80.0–100.0)
Monocytes Absolute: 0.4 10*3/uL (ref 0.1–1.0)
Monocytes Relative: 18 %
Neutro Abs: 1.5 10*3/uL — ABNORMAL LOW (ref 1.7–7.7)
Neutrophils Relative %: 68 %
Platelet Count: 145 10*3/uL — ABNORMAL LOW (ref 150–400)
RBC: 3.13 MIL/uL — ABNORMAL LOW (ref 4.22–5.81)
RDW: 17.7 % — ABNORMAL HIGH (ref 11.5–15.5)
WBC Count: 2.2 10*3/uL — ABNORMAL LOW (ref 4.0–10.5)
nRBC: 0 % (ref 0.0–0.2)

## 2022-11-02 LAB — CMP (CANCER CENTER ONLY)
ALT: 10 U/L (ref 0–44)
AST: 11 U/L — ABNORMAL LOW (ref 15–41)
Albumin: 4 g/dL (ref 3.5–5.0)
Alkaline Phosphatase: 49 U/L (ref 38–126)
Anion gap: 7 (ref 5–15)
BUN: 12 mg/dL (ref 8–23)
CO2: 31 mmol/L (ref 22–32)
Calcium: 9.2 mg/dL (ref 8.9–10.3)
Chloride: 103 mmol/L (ref 98–111)
Creatinine: 0.6 mg/dL — ABNORMAL LOW (ref 0.61–1.24)
GFR, Estimated: >60 mL/min (ref >60)
Glucose, Bld: 111 mg/dL — ABNORMAL HIGH (ref 70–99)
Potassium: 3.4 mmol/L — ABNORMAL LOW (ref 3.5–5.1)
Sodium: 141 mmol/L (ref 135–145)
Total Bilirubin: 0.4 mg/dL (ref 0.3–1.2)
Total Protein: 6.3 g/dL — ABNORMAL LOW (ref 6.5–8.1)

## 2022-11-02 MED ORDER — SODIUM CHLORIDE 0.9% FLUSH
10.0000 mL | Freq: Once | INTRAVENOUS | Status: AC
  Administered 2022-11-02: 10 mL

## 2022-11-02 MED ORDER — HEPARIN SOD (PORK) LOCK FLUSH 100 UNIT/ML IV SOLN
500.0000 [IU] | Freq: Once | INTRAVENOUS | Status: AC
  Administered 2022-11-02: 500 [IU]

## 2022-11-02 NOTE — Progress Notes (Signed)
While accessing patient's port, RN noted small, dark scab over port site. Patient reported that he had forgotten his bandaid for about a week over port site. Patient was advised to take port dressing off before going to bed the same day that it is placed to prevent infection. Patient verbalized understanding that if site becomes red, inflamed, tender or if he develops fevers he needs to call MD office immediately. No drainage/tenderness was noted at port site upon assessment.

## 2022-11-02 NOTE — Patient Instructions (Signed)
[employer], Adult A central line is a long, thin tube (catheter) that is put into a vein so that it goes to a large vein above your heart. It can be used to: Give you medicine or fluids. Give you food and nutrients. Take blood or give you blood for testing or treatments. Types of central lines There are four main types of central lines: Peripherally inserted central catheter (PICC) line. This type is usually put in the upper arm and goes up the arm to the heart. Tunneled central line. This type is placed in a large vein in the neck, chest, or groin. It is tunneled under the skin and brought out through a second incision. Non-tunneled central line. This type is used for a shorter time than other types, usually for 7 days at the most. It is inserted in the neck, chest, or groin. Implanted port. This type can stay in place longer than other types of central lines. It is normally put in the upper chest but can also be placed in the upper arm or the belly. Surgery is needed to put it in and take it out. The type of central line you get will depend on how long you need it and your medical condition. Tell a doctor about: Any allergies you have. All medicines you are taking. These include vitamins, herbs, eye drops, creams, and over-the-counter medicines. Any problems you or family members have had with anesthetic medicines. Any blood disorders you have. Any surgeries you have had. Any medical conditions you have. Whether you are pregnant or may be pregnant. What are the risks? Generally, central lines are safe. However, problems may occur, including: Infection. A blood clot. Bleeding from the place where the central line was inserted. Getting a hole or crack in the central line. If this happens, the central line will need to be replaced. Central line failure. The catheter moving or coming out of place. What happens before the procedure? Medicines Ask your doctor about changing or  stopping: Your normal medicines. Vitamins, herbs, and supplements. Over-the-counter medicines. Do not take aspirin or ibuprofen unless you are told to. General instructions Follow instructions from your doctor about eating or drinking. For your safety, your doctor may: [place] the area of the procedure. Remove hair at the procedure site. Ask you to wash with a soap that kills germs. Plan to have a responsible adult take you home from the hospital or clinic. If you will be going home right after the procedure, plan to have a responsible adult care for you for the time you are told. This is important. What happens during the procedure? An IV tube will be put into one of your veins. You may be given: A sedative. This medicine helps you relax. Anesthetics. These medicines numb certain areas of your body. Your skin will be cleaned with a germ-killing (antiseptic) solution. You may be covered with clean drapes. Your blood pressure, heart rate, breathing rate, and blood oxygen level will be monitored during the procedure. The central line will be put into the vein and moved through it to the correct spot. The doctor may use X-ray equipment to help guide the central line to the right place. A bandage (dressing) will be placed over the insertion area. The procedure may vary among doctors and hospitals. What can I expect after the procedure? You will be monitored until you leave the hospital or clinic. This includes checking your blood pressure, heart rate, breathing rate, and blood oxygen level. Caps may  be placed on the ends of the central line tubing. If you were given a sedative during your procedure, do not drive or use machines until your doctor says that it is safe. Follow these instructions at home: Caring for the tube  Follow instructions from your doctor about: Flushing the tube. Cleaning the tube and the area around it. Only use germ-free (sterile) supplies to flush. The supplies  should be from your doctor, a pharmacy, or another place that your doctor recommends. Before you flush the tube or clean the area around the tube: Wash your hands with soap and water for at least 20 seconds. If you cannot use soap and water, use hand sanitizer. Clean the central line hub with rubbing alcohol. To do this: Scrub it using a twisting motion and rub for 10 to 15 seconds or for 30 twists. Follow the manufacturer's instructions. Be sure you scrub the top of the hub, not just the sides. Never reuse alcohol pads. Let the hub dry before use. Keep it from touching anything while drying. Caring for your skin Check the skin around the central line every day for signs of infection. Check for: Redness, swelling, or pain. Fluid or blood. Warmth. Pus or a bad smell. Keep the area where the tube was put in clean and dry. Change bandages only as told by your doctor. Keep your bandage dry. If a bandage gets wet, have it changed right away. General instructions Keep the tube clamped, unless it is being used. If you or someone else accidentally pulls on the tube, make sure: The bandage is okay. There is no bleeding. The tube has not been pulled out. Do not use scissors or sharp objects near the tube. Do not take baths, swim, or use a hot tub until your doctor says it is okay. Ask your doctor if you may take showers. You may only be allowed to take sponge baths. Ask your doctor what activities are safe for you. Your doctor may tell you not to lift anything or move your arm too much. Take over-the-counter and prescription medicines only as told by your doctor. Keep all follow-up visits. Storing and throwing away supplies Keep your supplies in a clean, dry location. Throw away any used syringes in a container that is only for sharp items (sharps container). You can buy a sharps container from a pharmacy, or you can make one by using an empty hard plastic bottle with a cover. Place any used  bandages or infusion bags into a plastic bag. Throw that bag in the trash. Contact a doctor if: You have any of these signs of infection where the tube was put in: Redness, swelling, or pain. Fluid or blood. Warmth. Pus or a bad smell. Get help right away if: You have: A fever or chills. Shortness of breath. Pain in your chest. A fast heartbeat. Swelling in your neck, face, chest, or arm. You feel dizzy or you faint. There are red lines coming from where the tube was put in. The area where the tube was put in is bleeding and the bleeding will not stop. Your tube is hard to flush. You do not get a blood return from the tube. The tube gets loose or comes out. The tube has a hole or a tear. The tube leaks. Summary A central line is a long, thin tube (catheter) that is put in your vein. It can be used to give you medicine, food, or fluids. Follow instructions from your doctor about flushing  and cleaning the tube. Keep the area where the tube was put in clean and dry. Ask your doctor what activities are safe for you. This information is not intended to replace advice given to you by your health care provider. Make sure you discuss any questions you have with your health care provider. Document Revised: 08/15/2020 Document Reviewed: 08/15/2020 Elsevier Patient Education  [place].

## 2022-11-03 ENCOUNTER — Other Ambulatory Visit: Payer: Self-pay

## 2022-11-04 ENCOUNTER — Ambulatory Visit (HOSPITAL_COMMUNITY)
Admission: RE | Admit: 2022-11-04 | Discharge: 2022-11-04 | Disposition: A | Discharge status: Home / Self Care | Payer: Medicare Other | Source: Ambulatory Visit | Attending: Internal Medicine | Admitting: Internal Medicine

## 2022-11-04 DIAGNOSIS — C3412 Malignant neoplasm of upper lobe, left bronchus or lung: Secondary | ICD-10-CM | POA: Diagnosis not present

## 2022-11-04 DIAGNOSIS — Z431 Encounter for attention to gastrostomy: Secondary | ICD-10-CM | POA: Insufficient documentation

## 2022-11-04 HISTORY — PX: IR GASTROSTOMY TUBE REMOVAL: IMG5492

## 2022-11-04 MED ORDER — LIDOCAINE VISCOUS HCL 2 % MT SOLN
OROMUCOSAL | Status: AC
  Administered 2022-11-04: 5 mL via BODY_CAVITY
  Filled 2022-11-04: qty 15

## 2022-11-04 NOTE — Procedures (Signed)
  Successful bedside removal of intact pull through G-tube. No immediate post procedural complications. Gauze dressing placed over site.  Pt provided with care instructions and verbalized understanding of care.  EBL <1cc  Tyson Alias, AGNP 11/04/2022 10:32 AM

## 2022-11-06 ENCOUNTER — Ambulatory Visit (HOSPITAL_COMMUNITY)
Admission: RE | Admit: 2022-11-06 | Discharge: 2022-11-06 | Disposition: A | Discharge status: Home / Self Care | Payer: Medicare Other | Source: Ambulatory Visit | Attending: Internal Medicine | Admitting: Internal Medicine

## 2022-11-06 DIAGNOSIS — C3490 Malignant neoplasm of unspecified part of unspecified bronchus or lung: Secondary | ICD-10-CM | POA: Diagnosis present

## 2022-11-06 MED ORDER — SODIUM CHLORIDE (PF) 0.9 % IJ SOLN
INTRAMUSCULAR | Status: AC
  Filled 2022-11-06: qty 50

## 2022-11-06 MED ORDER — IOHEXOL 300 MG/ML  SOLN
100.0000 mL | Freq: Once | INTRAMUSCULAR | Status: AC | PRN
  Administered 2022-11-06: 100 mL via INTRAVENOUS

## 2022-11-06 NOTE — Progress Notes (Unsigned)
[facility] OFFICE PROGRESS NOTE  [name], NP [place] [address]  DIAGNOSIS: : Metastatic small cell lung cancer initially diagnosed as Limited stage (T1c, N1, M1a) small cell lung cancer presented with left upper lobe lung nodule as well as left hilar lymphadenopathy as well as right upper lobe lung nodule diagnosed in February 2023   PRIOR THERAPY: Systemic chemotherapy with cisplatin 80 Mg/M2 on day 1 and 2 etoposide 100 Mg/M2 on days 1, 2 and 3 concurrent with radiotherapy.  First dose February 17, 2022.  Status post 4 cycles. Starting from cycle #2 his dose of cisplatin was reduced to 60 Mg/M2 on day 1 and etoposide reduced to 80 Mg/M2 on days 1, 2 and 3 every 3 weeks secondary to intolerance.  Last dose was giving July 14, 2022 discontinued after disease progression.   CURRENT THERAPY: Zepzelca (lurbinectedin) 2.6 Mg/M2 every 3 weeks.  First dose September 07, 2022.  Status post 3 cycles.   INTERVAL HISTORY: Mark Yu 66 y.o. male returns to the clinic today for a follow-up visit.  Unfortunately, the patient relapsed early after the induction chemotherapy.  He is currently on second line systemic chemotherapy with Zepzelca.   Since last being seen, he had his PEG tube removed. Since having it removed, the area has not healed. When he eats and drinks, fluid is leaking. He has been using gauze and paper tape.   He also is covered in bruises today. He is is not on a blood thinner. He fell twice in the last week. The first time he was in the bathroom and slipped. He had some body aches after this and called his PCP who prescribed Zanaflex. A few days later, due to his hoarseness, he was drinking honey bourbon. He had two beverages. He was sitting on the edge of his bed and was too close to the edge and slipped off when he was trying to get up to use the restroom. He also has been taking NSAID which have helped. He has a cane and walker.    Otherwise, de denies any fever, chills, or night sweats.  He lost a few pounds since last being seen.  He has shortness of breath with exertion which is improved since having the PEG tube removed per patient report. He reports only a minor cough. He denies sinus drainage. He continues to have hoarseness in his voice the last few weeks. Denies any chest pain or hemoptysis.  Denies any nausea, vomiting, or diarrhea. He has some constipation a few days after treatment and will take Doculax. Denies any headache or visual changes.  He recently had a restaging CT scan performed.  He is here today for evaluation to review his scan results before starting cycle #4.    MEDICAL HISTORY: Past Medical History:  Diagnosis Date   Anxiety    tx xanax   COPD (chronic obstructive pulmonary disease) ([place])    tx inhalers   Depression    Dyspnea    with exertion   HTN (hypertension)    tx amlodipine   Hyponatremia 03/08/2022    ALLERGIES:  is allergic to amoxicillin and macrolides and ketolides.  MEDICATIONS:  Current Outpatient Medications  Medication Sig Dispense Refill   albuterol (PROVENTIL) (2.5 MG/3ML) 0.083% nebulizer solution Take 2.5 mg by nebulization every 6 (six) hours as needed for wheezing or shortness of breath.     albuterol (VENTOLIN HFA) 108 (90 Base) MCG/ACT inhaler Inhale 2 puffs into the  lungs every 4 (four) hours as needed for wheezing or shortness of breath.     ALPRAZolam (XANAX) 1 MG tablet TAKE 1/2 TO 1 TABLET BY MOUTH TWO TIMES A DAY AS NEEDED FOR ANXIETY 30 tablet 0   amLODipine (NORVASC) 5 MG tablet Take 5 mg by mouth daily.     BREZTRI AEROSPHERE 160-9-4.8 MCG/ACT AERO Inhale 2 puffs into the lungs in the morning and at bedtime.     dextromethorphan-guaiFENesin (ROBITUSSIN-DM) 10-100 MG/5ML liquid Take 10 mLs by mouth 2 (two) times daily.     fluticasone (FLONASE) 50 MCG/ACT nasal spray Place 2 sprays into both nostrils daily as needed for allergies or rhinitis.      lidocaine-prilocaine (EMLA) cream Apply to the Port-A-Cath site 30-60 minutes before chemotherapy (Patient not taking: Reported on 06/11/2022) 30 g 0   Nutritional Supplements (FEEDING SUPPLEMENT, OSMOLITE 1.5 CAL,) LIQD Place 1,000 mLs into feeding tube continuous.  0   pantoprazole (PROTONIX) 40 MG tablet Take 40 mg by mouth daily.     predniSONE (DELTASONE) 10 MG tablet TAKE TWO TABLETS BY MOUTH DAILY WITH BREAKFAST 180 tablet 0   prochlorperazine (COMPAZINE) 10 MG tablet Take 1 tablet (10 mg total) by mouth every 6 (six) hours as needed for nausea or vomiting. 30 tablet 0   Water For Irrigation, Sterile (FREE WATER) SOLN Place 150 mLs into feeding tube 6 (six) times daily. 30 mL 3   No current facility-administered medications for this visit.   Facility-Administered Medications Ordered in Other Visits  Medication Dose Route Frequency Provider Last Rate Last Admin   sodium chloride (PF) 0.9 % injection             SURGICAL HISTORY:  Past Surgical History:  Procedure Laterality Date   BRONCHIAL BIOPSY  02/02/2022   Procedure: BRONCHIAL BIOPSIES;  Surgeon: Collene Gobble, MD;  Location: [facility] ENDOSCOPY;  Service: Pulmonary;;   BRONCHIAL BRUSHINGS  02/02/2022   Procedure: BRONCHIAL BRUSHINGS;  Surgeon: Collene Gobble, MD;  Location: [facility] ENDOSCOPY;  Service: Pulmonary;;   BRONCHIAL NEEDLE ASPIRATION BIOPSY  02/02/2022   Procedure: BRONCHIAL NEEDLE ASPIRATION BIOPSIES;  Surgeon: Collene Gobble, MD;  Location: [facility] ENDOSCOPY;  Service: Pulmonary;;   IR GASTROSTOMY TUBE MOD SED  04/03/2022   IR GASTROSTOMY TUBE REMOVAL  11/04/2022   IR IMAGING GUIDED PORT INSERTION  02/24/2022   MRI     NO PAST SURGERIES     VIDEO BRONCHOSCOPY WITH ENDOBRONCHIAL ULTRASOUND N/A 02/02/2022   Procedure: VIDEO BRONCHOSCOPY WITH ENDOBRONCHIAL ULTRASOUND;  Surgeon: Collene Gobble, MD;  Location: [facility] ENDOSCOPY;  Service: Pulmonary;  Laterality: N/A;   VIDEO BRONCHOSCOPY WITH RADIAL ENDOBRONCHIAL ULTRASOUND  02/02/2022   Procedure:  VIDEO BRONCHOSCOPY WITH RADIAL ENDOBRONCHIAL ULTRASOUND;  Surgeon: Collene Gobble, MD;  Location: [facility] ENDOSCOPY;  Service: Pulmonary;;    REVIEW OF SYSTEMS:   Review of Systems  Constitutional: Positive for fatigue. Positive for weight loss. Negative for appetite change, chills, and fever.  HENT:  Negative for mouth sores, nosebleeds, sore throat and trouble swallowing.   Eyes: Negative for eye problems and icterus.  Respiratory: Positive for stable/somewhat improved dyspnea on exertion. Negative for cough, hemoptysis,  and wheezing.   Cardiovascular: Negative for chest pain and leg swelling.  Gastrointestinal: Negative for abdominal pain, constipation (none at this time, some after treatment), diarrhea, nausea and vomiting.  Genitourinary: Negative for bladder incontinence, difficulty urinating, dysuria, frequency and hematuria.   Musculoskeletal: Negative for back pain, gait problem, neck pain and neck stiffness.  Skin: Negative for itching and rash.  Neurological: Negative for dizziness, extremity weakness, gait problem, headaches, light-headedness and seizures.  Hematological: Negative for adenopathy. Positive for significant bruising.  Psychiatric/Behavioral: Negative for confusion, depression and sleep disturbance. The patient is not nervous/anxious.     PHYSICAL EXAMINATION:  There were no vitals taken for this visit.  ECOG PERFORMANCE STATUS: 2  Physical Exam  Constitutional: Oriented to person, place, and time and  thin appearing male. No distress.  HENT:  Head: Normocephalic and atraumatic.  Mouth/Throat: Oropharynx is clear and moist. No oropharyngeal exudate.  Eyes: Conjunctivae are normal. Right eye exhibits no discharge. Left eye exhibits no discharge. No scleral icterus.  Neck: Normal range of motion. Neck supple.  Cardiovascular: Normal rate, regular rhythm, normal heart sounds and intact distal pulses.   Pulmonary/Chest: Effort normal.  Quiet breath sounds in all lung  fields. No respiratory distress. No wheezes. No rales.  Abdominal: Soft. Bowel sounds are normal. Exhibits no distension and no mass. There is no tenderness.  Musculoskeletal: Normal range of motion. Exhibits no edema.  Lymphadenopathy:    No cervical adenopathy.  Neurological: Alert and oriented to person, place, and time. Exhibits muscle wasting.  The patient was examined in the wheelchair.  Skin: Significant bruising on upper extremities. Skin is warm and dry. No rash noted. Not diaphoretic. No erythema. No pallor.  Psychiatric: Mood, memory and judgment normal.  Vitals reviewed.  LABORATORY DATA: Lab Results  Component Value Date   WBC 2.2 (L) 11/02/2022   HGB 10.3 (L) 11/02/2022   HCT 31.8 (L) 11/02/2022   MCV 101.6 (H) 11/02/2022   PLT 145 (L) 11/02/2022      Chemistry      Component Value Date/Time   NA 141 11/02/2022 0854   K 3.4 (L) 11/02/2022 0854   CL 103 11/02/2022 0854   CO2 31 11/02/2022 0854   BUN 12 11/02/2022 0854   CREATININE 0.60 (L) 11/02/2022 0854      Component Value Date/Time   CALCIUM 9.2 11/02/2022 0854   ALKPHOS 49 11/02/2022 0854   AST 11 (L) 11/02/2022 0854   ALT 10 11/02/2022 0854   BILITOT 0.4 11/02/2022 0854       RADIOGRAPHIC STUDIES:  IR GASTROSTOMY TUBE REMOVAL  Result Date: 11/04/2022 INDICATION: Patient history of small-cell lung cancer currently receiving chemoradiation and trach placement. Patient tried has been removed and he is eating and drinking normally. Request is for gastrostomy tube removal as it is no longer needed. EXAM: BEDSIDE REMOVAL OF GASTROSTOMY TUBE COMPARISON:  None Available. MEDICATIONS: Viscous lidocaine 10 mL CONTRAST:  None FLUOROSCOPY TIME:  None COMPLICATIONS: None immediate. PROCEDURE: Informed written consent was obtained from the patient and the patient's wife following explanation of the procedure, risks, benefits and alternatives. A time-out was performed prior to the initiation of the procedure. The track  of the existing gastrostomy tube was lubricated with viscous lidocaine. Using manual traction, the existing pull-through gastrostomy tube was removed intact. Pressure was held at the site until hemostasis was achieved. A dressing was placed. The patient tolerated the procedure well without immediate postprocedural complication. IMPRESSION: Successful bedside removal of pull-through gastrostomy tube without complication. Read by: Narda Rutherford, AGNP-BC Electronically Signed   By: Ruthann Cancer M.D.   On: 11/04/2022 12:04     ASSESSMENT/PLAN:  This is a very pleasant 66 year old Caucasian male diagnosed with small cell lung cancer.  He was diagnosed in February 2023.  He presented with likely extensive stage disease (T1c, N1, M1  a).  He presented with bilateral nodules in the right upper lobe as well as a left upper lobe but this also could be treated with limited stage disease because of the proximity of the lesions.  The patient initially underwent cystoscopy chemotherapy with cisplatin 80 mg per metered squared on day 1 and etoposide 100 mg per metered squared on days 1, 2, and 3 IV every 3 weeks.  He status post 4 cycles of treatment.  He was admitted after cycle #1 for respiratory failure and multifocal pneumonia and radiation pneumonitis.  During from cycle 2 the patient's dose of cisplatin was reduced to 60 mg per metered squared on day 1 and etoposide 80 mg per metered squared.  He had restaging scans that showed improvement in the lung lesions but there was concerning hypodensity lesion in the left hepatic lobe concerning for metastasis.  Unfortunately, the patient had a relapse of disease shortly after the induction systemic chemotherapy.  The patient understands this is a poor prognostic factor.  The patient is currently undergoing second line chemotherapy with stem cell, 2.6 mg per metered squared every 3 weeks.  First dose was on 09/08/2022.  He is status post 3 cycles.  The patient recently  had a scan.  Dr. Julien Nordmann personally and independently reviewed the scan and discussed the results with the patient today.  The scan showed stable disease in the lung but the patient has disease progression in the liver.  Dr. Julien Nordmann had a lengthy discussion with the patient today about his current condition and recommended treatment options.  Dr. Julien Nordmann reiterated that the patient's condition is serious and his condition is incurable.  He was given the option of referral to hospice.  If the patient was interested in trying further treatment, last line of therapy that we could try is Irinotecan day 1 and 8 every 3 weeks.  Dr. Julien Nordmann discussed without treatment his prognosis is less than 3 months.  He also discussed that even with treatment, this is not curative but the goal is to see if it can buy him more time. We would arrange for scan after 2-3 cycles of treatment to see if responding. If no response, then Dr. Julien Nordmann discussed he would not have any additional options.   The patient would like to try treatment with Irinotecan days 1 and 8. We will arrange for this to be started next week.  The patient was given a handout of occasion regarding this medication.  I discussed with the patient my the most common side effects is diarrhea and I would recommend having Imodium on hand at home.  Regarding the following, the patient was encouraged to use his walker.  No fractures were seen on recent scan.  Currently taking NSAIDs.  Discussed with the patient that he may alternate between ibuprofen and Tylenol if needed.  We will see him back for follow-up visit in 2 weeks for evaluation before starting day 8 cycle 1.  I will reach out to the counselor to see if she can call the patient. Understandably, it was difficult for him with receiving his scan results today.   The patient was advised to call immediately if he has any concerning symptoms in the interval. The patient voices understanding of current  disease status and treatment options and is in agreement with the current care plan. All questions were answered. The patient knows to call the clinic with any problems, questions or concerns. We can certainly see the patient much sooner if necessary  No orders of the defined types were placed in this encounter.     Shadoe Cryan L Ziaire Bieser, PA-C 11/06/22  ADDENDUM: Hematology/Oncology Attending: I had a face-to-face encounter with the patient today.  I reviewed his record, lab, scan and recommended his care plan.  This is a very pleasant 66 years old white male diagnosed with extensive stage small cell lung cancer in February 2023 status post systemic chemotherapy with cisplatin under etoposide concurrent with radiotherapy but he has a very complicated course after the radiotherapy with prolonged admission to the hospital with severe pneumonia.  He completed 4 cycles of this treatment but unfortunately has evidence for disease progression with hypodense lesion in the liver. The patient started second line systemic chemotherapy with Zepzelca (lurbinectedin) status post 3 cycles.  He has been tolerating the treatment well except for the increasing fatigue and chemotherapy-induced anemia and pancytopenia. He had few falls recently.  His last MRI of the brain in September 2023 was negative for metastatic disease to the brain. The patient had repeat CT scan of the chest, abdomen and pelvis performed recently.  I personally and independently reviewed the scan images and discussed the result with the patient today. Unfortunately his scan showed evidence for disease progression especially in the liver. I had a lengthy discussion with the patient today about his current condition, prognosis and treatment options. The patient understands that he has very poor prognosis and short life expectancy with and without treatment. I discussed with him the option of palliative care and hospice referral but he  declined. He would like to continue additional treatment and I will arrange for him to start third line treatment with single agent irinotecan 65 Mg/M2 on days 1 and 8 every 3 weeks.  He is expected to start the first cycle of this treatment next week. I discussed with the patient the adverse effect of this treatment. The patient understands that there are limited treatment options after this treatment with irinotecan. We will see him back for follow-up visit in 2 weeks for evaluation and management of any adverse effect of his treatment. He was advised to call immediately if he has any other concerning symptoms in the interval. The total time spent in the appointment was 30 minutes. Disclaimer: This note was dictated with voice recognition software. Similar sounding words can inadvertently be transcribed and may be missed upon review. Eilleen Kempf, MD

## 2022-11-09 MED FILL — Dexamethasone Sodium Phosphate Inj 100 MG/10ML: INTRAMUSCULAR | Qty: 1 | Status: AC

## 2022-11-10 ENCOUNTER — Inpatient Hospital Stay: Payer: Medicare Other

## 2022-11-10 ENCOUNTER — Inpatient Hospital Stay (HOSPITAL_BASED_OUTPATIENT_CLINIC_OR_DEPARTMENT_OTHER): Payer: Medicare Other | Admitting: Physician Assistant

## 2022-11-10 VITALS — BP 139/91 | HR 117 | Temp 98.2°F | Resp 16 | Wt 112.7 lb

## 2022-11-10 DIAGNOSIS — C3412 Malignant neoplasm of upper lobe, left bronchus or lung: Secondary | ICD-10-CM

## 2022-11-10 DIAGNOSIS — T451X5A Adverse effect of antineoplastic and immunosuppressive drugs, initial encounter: Secondary | ICD-10-CM

## 2022-11-10 DIAGNOSIS — C787 Secondary malignant neoplasm of liver and intrahepatic bile duct: Secondary | ICD-10-CM | POA: Diagnosis not present

## 2022-11-10 DIAGNOSIS — Z95828 Presence of other vascular implants and grafts: Secondary | ICD-10-CM

## 2022-11-10 DIAGNOSIS — Z5111 Encounter for antineoplastic chemotherapy: Secondary | ICD-10-CM | POA: Diagnosis not present

## 2022-11-10 DIAGNOSIS — D6481 Anemia due to antineoplastic chemotherapy: Secondary | ICD-10-CM | POA: Diagnosis not present

## 2022-11-10 LAB — CBC WITH DIFFERENTIAL (CANCER CENTER ONLY)
Abs Immature Granulocytes: 0.22 10*3/uL — ABNORMAL HIGH (ref 0.00–0.07)
Basophils Absolute: 0 10*3/uL (ref 0.0–0.1)
Basophils Relative: 1 %
Eosinophils Absolute: 0.2 10*3/uL (ref 0.0–0.5)
Eosinophils Relative: 3 %
HCT: 33.2 % — ABNORMAL LOW (ref 39.0–52.0)
Hemoglobin: 11.1 g/dL — ABNORMAL LOW (ref 13.0–17.0)
Immature Granulocytes: 3 %
Lymphocytes Relative: 9 %
Lymphs Abs: 0.6 10*3/uL — ABNORMAL LOW (ref 0.7–4.0)
MCH: 33.4 pg (ref 26.0–34.0)
MCHC: 33.4 g/dL (ref 30.0–36.0)
MCV: 100 fL (ref 80.0–100.0)
Monocytes Absolute: 1.2 10*3/uL — ABNORMAL HIGH (ref 0.1–1.0)
Monocytes Relative: 17 %
Neutro Abs: 4.8 10*3/uL (ref 1.7–7.7)
Neutrophils Relative %: 67 %
Platelet Count: 221 10*3/uL (ref 150–400)
RBC: 3.32 MIL/uL — ABNORMAL LOW (ref 4.22–5.81)
RDW: 17.5 % — ABNORMAL HIGH (ref 11.5–15.5)
WBC Count: 7.1 10*3/uL (ref 4.0–10.5)
nRBC: 0 % (ref 0.0–0.2)

## 2022-11-10 LAB — CMP (CANCER CENTER ONLY)
ALT: 10 U/L (ref 0–44)
AST: 15 U/L (ref 15–41)
Albumin: 4.4 g/dL (ref 3.5–5.0)
Alkaline Phosphatase: 55 U/L (ref 38–126)
Anion gap: 8 (ref 5–15)
BUN: 13 mg/dL (ref 8–23)
CO2: 30 mmol/L (ref 22–32)
Calcium: 9.1 mg/dL (ref 8.9–10.3)
Chloride: 101 mmol/L (ref 98–111)
Creatinine: 0.74 mg/dL (ref 0.61–1.24)
GFR, Estimated: >60 mL/min (ref >60)
Glucose, Bld: 100 mg/dL — ABNORMAL HIGH (ref 70–99)
Potassium: 3.7 mmol/L (ref 3.5–5.1)
Sodium: 139 mmol/L (ref 135–145)
Total Bilirubin: 0.6 mg/dL (ref 0.3–1.2)
Total Protein: 7 g/dL (ref 6.5–8.1)

## 2022-11-10 MED ORDER — SODIUM CHLORIDE 0.9% FLUSH
10.0000 mL | Freq: Once | INTRAVENOUS | Status: AC
  Administered 2022-11-10: 10 mL

## 2022-11-10 MED ORDER — HEPARIN SOD (PORK) LOCK FLUSH 100 UNIT/ML IV SOLN
500.0000 [IU] | Freq: Once | INTRAVENOUS | Status: AC
  Administered 2022-11-10: 500 [IU]

## 2022-11-10 NOTE — Progress Notes (Signed)
Per Cassie, PA patient will not receive tx today. Patients port de accessed and assisted to lobby.

## 2022-11-11 ENCOUNTER — Other Ambulatory Visit: Payer: Self-pay

## 2022-11-11 ENCOUNTER — Telehealth: Payer: Self-pay

## 2022-11-11 NOTE — Telephone Encounter (Signed)
Mark Yu, counseling intern, called patient after referral from nurse. Patient reported feeling taken aback by the news he received the day prior. The patient reported they plan to live to be 66 years old and will continue fighting. The patient reported they will be calling hospice next week.   The counselor let the patient know they plan to call the patient after Thanksgiving to check in on them.   Mark Yu,  Counseling Intern  [phone] [email or web address]

## 2022-11-13 ENCOUNTER — Telehealth: Payer: Self-pay | Admitting: Internal Medicine

## 2022-11-13 NOTE — Telephone Encounter (Signed)
Called patient regarding upcoming November, December and January appointments. Patient is notified.

## 2022-11-14 ENCOUNTER — Other Ambulatory Visit: Payer: Self-pay

## 2022-11-15 NOTE — Progress Notes (Deleted)
[facility] OFFICE PROGRESS NOTE  [name], NP [place] [address]  DIAGNOSIS:  Metastatic small cell lung cancer initially diagnosed as Limited stage (T1c, N1, M1a) small cell lung cancer presented with left upper lobe lung nodule as well as left hilar lymphadenopathy as well as right upper lobe lung nodule diagnosed in February 2023    PRIOR THERAPY:  1) Systemic chemotherapy with cisplatin 80 Mg/M2 on day 1 and 2 etoposide 100 Mg/M2 on days 1, 2 and 3 concurrent with radiotherapy.  First dose February 17, 2022.  Status post 4 cycles. Starting from cycle #2 his dose of cisplatin was reduced to 60 Mg/M2 on day 1 and etoposide reduced to 80 Mg/M2 on days 1, 2 and 3 every 3 weeks secondary to intolerance.  Last dose was giving July 14, 2022 discontinued after disease progression.   2) Zepzelca (lurbinectedin) 2.6 Mg/M2 every 3 weeks.  First dose September 07, 2022.  Status post 3 cycles.    CURRENT THERAPY: Irinotecan on days 1 and 8 IV every 3 weeks.  First dose expected on 11/17/22.  INTERVAL HISTORY: Mark Yu 66 y.o. male returns to clinic today for a follow-up visit.  The patient was last seen on 11/10/2022.  Unfortunately, the patient was recently found to have evidence of disease progression.  The patient was given the option of referral to palliative care/hospice versus trying third line chemotherapy.  Understandably, it was hard for the patient to hear this information.  The counselor called him to check on him and when he is going to check on him again * and **. A discussion about his prognosis was discussed at his last appointment. The patient understands that even with treatment, his prognosis is poor, likely less than 3 months.  The patient was supposed to undergo cycle #1 of treatment last week, but he presented to the ER on 11/21 for urinary retention. He had a foley catheter placed and followed up with urology on ***. He also had his  PEG tube removed on11/16. He had some leakage of fluid after eating. He was evaluated by IR and WOCN on 11/21 while in the ER. They suspect that the leakage is related to the tract not being completely sealed. Nothing can be done to speed up the process and smaller frequent meals and rest in reclined position to minizie leakage was recommended. He was also treated with 10 days of doxycycline in the event that there was any cellulitis component.    Today he denies any fever, chills, or night sweats.  The patient's been having some ongoing hoarseness in his voice.  He thinks his shortness of breath with exertion has improved since having his PEG tube removed approximately 3 weeks ago.  Still leaking?  He reports minor cough.  He denies any mops this or chest pain.  Denies any nausea, vomiting, diarrhea, or constipation.  Denies any headache or visual changes.  He is here today for evaluation repeat blood work before undergoing day 1 cycle 1.      MEDICAL HISTORY: Past Medical History:  Diagnosis Date   Anxiety    tx xanax   COPD (chronic obstructive pulmonary disease) ([place])    tx inhalers   Depression    Dyspnea    with exertion   HTN (hypertension)    tx amlodipine   Hyponatremia 03/08/2022    ALLERGIES:  is allergic to amoxicillin and macrolides and ketolides.  MEDICATIONS:  Current Outpatient Medications  Medication Sig Dispense Refill   acetaminophen (TYLENOL) 325 MG tablet 1 tablet as needed Orally every 4 hrs (Patient not taking: Reported on 11/10/2022)     albuterol (PROVENTIL) (2.5 MG/3ML) 0.083% nebulizer solution Take 2.5 mg by nebulization every 6 (six) hours as needed for wheezing or shortness of breath.     ALPRAZolam (XANAX) 1 MG tablet TAKE 1/2 TO 1 TABLET BY MOUTH TWO TIMES A DAY AS NEEDED FOR ANXIETY 30 tablet 0   amLODipine (NORVASC) 5 MG tablet Take 5 mg by mouth daily.     BREZTRI AEROSPHERE 160-9-4.8 MCG/ACT AERO Inhale 2 puffs into the lungs in the morning and at  bedtime.     dextromethorphan-guaiFENesin (ROBITUSSIN-DM) 10-100 MG/5ML liquid Take 10 mLs by mouth 2 (two) times daily.     fluticasone (FLONASE) 50 MCG/ACT nasal spray Place 2 sprays into both nostrils daily as needed for allergies or rhinitis.     lidocaine-prilocaine (EMLA) cream Apply to the Port-A-Cath site 30-60 minutes before chemotherapy 30 g 0   Nutritional Supplements (FEEDING SUPPLEMENT, OSMOLITE 1.5 CAL,) LIQD Place 1,000 mLs into feeding tube continuous.  0   pantoprazole (PROTONIX) 40 MG tablet Take 40 mg by mouth daily.     predniSONE (DELTASONE) 10 MG tablet TAKE TWO TABLETS BY MOUTH DAILY WITH BREAKFAST 180 tablet 0   sildenafil (REVATIO) 20 MG tablet 1 tablet Orally Once a day for 90 days (Patient not taking: Reported on 11/10/2022)     Water For Irrigation, Sterile (FREE WATER) SOLN Place 150 mLs into feeding tube 6 (six) times daily. 30 mL 3   ZANAFLEX 4 MG tablet 1 tablet as needed Orally every 6 hours for 30 days     No current facility-administered medications for this visit.    SURGICAL HISTORY:  Past Surgical History:  Procedure Laterality Date   BRONCHIAL BIOPSY  02/02/2022   Procedure: BRONCHIAL BIOPSIES;  Surgeon: Collene Gobble, MD;  Location: [facility] ENDOSCOPY;  Service: Pulmonary;;   BRONCHIAL BRUSHINGS  02/02/2022   Procedure: BRONCHIAL BRUSHINGS;  Surgeon: Collene Gobble, MD;  Location: [facility] ENDOSCOPY;  Service: Pulmonary;;   BRONCHIAL NEEDLE ASPIRATION BIOPSY  02/02/2022   Procedure: BRONCHIAL NEEDLE ASPIRATION BIOPSIES;  Surgeon: Collene Gobble, MD;  Location: [facility] ENDOSCOPY;  Service: Pulmonary;;   IR GASTROSTOMY TUBE MOD SED  04/03/2022   IR GASTROSTOMY TUBE REMOVAL  11/04/2022   IR IMAGING GUIDED PORT INSERTION  02/24/2022   MRI     NO PAST SURGERIES     VIDEO BRONCHOSCOPY WITH ENDOBRONCHIAL ULTRASOUND N/A 02/02/2022   Procedure: VIDEO BRONCHOSCOPY WITH ENDOBRONCHIAL ULTRASOUND;  Surgeon: Collene Gobble, MD;  Location: [facility] ENDOSCOPY;  Service: Pulmonary;  Laterality:  N/A;   VIDEO BRONCHOSCOPY WITH RADIAL ENDOBRONCHIAL ULTRASOUND  02/02/2022   Procedure: VIDEO BRONCHOSCOPY WITH RADIAL ENDOBRONCHIAL ULTRASOUND;  Surgeon: Collene Gobble, MD;  Location: [facility] ENDOSCOPY;  Service: Pulmonary;;    REVIEW OF SYSTEMS:   Review of Systems  Constitutional: Negative for appetite change, chills, fatigue, fever and unexpected weight change.  HENT:   Negative for mouth sores, nosebleeds, sore throat and trouble swallowing.   Eyes: Negative for eye problems and icterus.  Respiratory: Negative for cough, hemoptysis, shortness of breath and wheezing.   Cardiovascular: Negative for chest pain and leg swelling.  Gastrointestinal: Negative for abdominal pain, constipation, diarrhea, nausea and vomiting.  Genitourinary: Negative for bladder incontinence, difficulty urinating, dysuria, frequency and hematuria.   Musculoskeletal: Negative for back pain, gait problem, neck pain and neck stiffness.  Skin: Negative for itching and rash.  Neurological: Negative for dizziness, extremity weakness, gait problem, headaches, light-headedness and seizures.  Hematological: Negative for adenopathy. Does not bruise/bleed easily.  Psychiatric/Behavioral: Negative for confusion, depression and sleep disturbance. The patient is not nervous/anxious.     PHYSICAL EXAMINATION:  There were no vitals taken for this visit.  ECOG PERFORMANCE STATUS: {CHL ONC ECOG [id number]  Physical Exam  Constitutional: Oriented to person, place, and time and well-developed, well-nourished, and in no distress. No distress.  HENT:  Head: Normocephalic and atraumatic.  Mouth/Throat: Oropharynx is clear and moist. No oropharyngeal exudate.  Eyes: Conjunctivae are normal. Right eye exhibits no discharge. Left eye exhibits no discharge. No scleral icterus.  Neck: Normal range of motion. Neck supple.  Cardiovascular: Normal rate, regular rhythm, normal heart sounds and intact distal pulses.   Pulmonary/Chest:  Effort normal and breath sounds normal. No respiratory distress. No wheezes. No rales.  Abdominal: Soft. Bowel sounds are normal. Exhibits no distension and no mass. There is no tenderness.  Musculoskeletal: Normal range of motion. Exhibits no edema.  Lymphadenopathy:    No cervical adenopathy.  Neurological: Alert and oriented to person, place, and time. Exhibits normal muscle tone. Gait normal. Coordination normal.  Skin: Skin is warm and dry. No rash noted. Not diaphoretic. No erythema. No pallor.  Psychiatric: Mood, memory and judgment normal.  Vitals reviewed.  LABORATORY DATA: Lab Results  Component Value Date   WBC 7.1 11/10/2022   HGB 11.1 (L) 11/10/2022   HCT 33.2 (L) 11/10/2022   MCV 100.0 11/10/2022   PLT 221 11/10/2022      Chemistry      Component Value Date/Time   NA 139 11/10/2022 0959   K 3.7 11/10/2022 0959   CL 101 11/10/2022 0959   CO2 30 11/10/2022 0959   BUN 13 11/10/2022 0959   CREATININE 0.74 11/10/2022 0959      Component Value Date/Time   CALCIUM 9.1 11/10/2022 0959   ALKPHOS 55 11/10/2022 0959   AST 15 11/10/2022 0959   ALT 10 11/10/2022 0959   BILITOT 0.6 11/10/2022 0959       RADIOGRAPHIC STUDIES:  CT Chest W Contrast  Result Date: 11/09/2022 CLINICAL DATA:  Small cell lung cancer staging; * Tracking Code: BO * EXAM: CT CHEST, ABDOMEN, AND PELVIS WITH CONTRAST TECHNIQUE: Multidetector CT imaging of the chest, abdomen and pelvis was performed following the standard protocol during bolus administration of intravenous contrast. RADIATION DOSE REDUCTION: This exam was performed according to the departmental dose-optimization program which includes automated exposure control, adjustment of the mA and/or kV according to patient size and/or use of iterative reconstruction technique. CONTRAST:  19mL OMNIPAQUE IOHEXOL 300 MG/ML  SOLN COMPARISON:  CT chest with contrast dated June 02, 2022; PET-CT dated January 20, 2022; MRI abdomen dated April 24, 2022  FINDINGS: CT CHEST FINDINGS Cardiovascular: Normal heart size. No pericardial effusion. Normal caliber thoracic aorta with severe atherosclerotic disease. No suspicious filling defects of the central pulmonary arteries. Moderate three-vessel coronary artery calcifications. Mediastinum/Nodes: Esophagus and thyroid are unremarkable. No pathologically enlarged nodes seen in the chest. Lungs/Pleura: Central airways are patent. Moderate centrilobular emphysema. Stable mild linear opacity of the left upper lobe, compatible with treated disease. Solid nodule of the right upper lobe measuring 1.6 x 1.3 cm on series 4, image 56, unchanged in size when remeasured in similar plane. Similar adjacent linear opacities which are compatible with post radiation change. No pleural effusion or pneumothorax Musculoskeletal: Old bilateral rib fractures. No  aggressive appearing osseous lesions. CT ABDOMEN PELVIS FINDINGS Hepatobiliary: Numerous irregular liver lesions, increased in size when compared with prior MRI. Reference lesion of the left hepatic lobe measures 3.5 x 2.7 cm on series 2, image 70, previously measured up to 1.8 cm. Reference lesion of the right hepatic lobe measures 2.2 x 2.0 cm on series 2, image 79, previously measured up to 1.0 cm .gallbladder is unremarkable. No biliary ductal dilation. Pancreas: Unremarkable. No pancreatic ductal dilatation or surrounding inflammatory changes. Spleen: Normal in size without focal abnormality. Adrenals/Urinary Tract: Bilateral adrenal glands are unremarkable. No hydronephrosis. No nephrolithiasis. Mild bladder wall thickening. Stomach/Bowel: Stomach is within normal limits. Appendix appears normal. Diverticulosis. Mild wall thickening of the sigmoid colon, similar to prior PET-CT and likely sequela of chronic diverticulitis. No evidence of obstruction. Vascular/Lymphatic: Aortic atherosclerosis. No enlarged abdominal or pelvic lymph nodes. Reproductive: Prostatomegaly Other: No  abdominal wall hernia or abnormality. No abdominopelvic ascites. Musculoskeletal: No acute or significant osseous findings. IMPRESSION: 1. Stable solid nodule of the right upper lobe with adjacent postradiation change and stable posttreatment changes of the left upper lobe. No evidence of progressive disease in the chest. 2. Hepatic metastatic lesions are increased in size when compared with prior abdominal MRI. 3. Prostatomegaly with mild bladder wall thickening, findings can be seen in the setting of chronic bladder outlet obstruction. 4. Mild wall thickening of the sigmoid colon possibly related to diverticular disease and similar to prior PET-CT. Recommend colonoscopy if patient is not current for screening. 5. Aortic Atherosclerosis (ICD10-I70.0) and Emphysema (ICD10-J43.9). Electronically Signed   By: Yetta Glassman M.D.   On: 11/09/2022 16:42   CT Abdomen Pelvis W Contrast  Result Date: 11/09/2022 CLINICAL DATA:  Small cell lung cancer staging; * Tracking Code: BO * EXAM: CT CHEST, ABDOMEN, AND PELVIS WITH CONTRAST TECHNIQUE: Multidetector CT imaging of the chest, abdomen and pelvis was performed following the standard protocol during bolus administration of intravenous contrast. RADIATION DOSE REDUCTION: This exam was performed according to the departmental dose-optimization program which includes automated exposure control, adjustment of the mA and/or kV according to patient size and/or use of iterative reconstruction technique. CONTRAST:  136mL OMNIPAQUE IOHEXOL 300 MG/ML  SOLN COMPARISON:  CT chest with contrast dated June 02, 2022; PET-CT dated January 20, 2022; MRI abdomen dated April 24, 2022 FINDINGS: CT CHEST FINDINGS Cardiovascular: Normal heart size. No pericardial effusion. Normal caliber thoracic aorta with severe atherosclerotic disease. No suspicious filling defects of the central pulmonary arteries. Moderate three-vessel coronary artery calcifications. Mediastinum/Nodes: Esophagus and  thyroid are unremarkable. No pathologically enlarged nodes seen in the chest. Lungs/Pleura: Central airways are patent. Moderate centrilobular emphysema. Stable mild linear opacity of the left upper lobe, compatible with treated disease. Solid nodule of the right upper lobe measuring 1.6 x 1.3 cm on series 4, image 56, unchanged in size when remeasured in similar plane. Similar adjacent linear opacities which are compatible with post radiation change. No pleural effusion or pneumothorax Musculoskeletal: Old bilateral rib fractures. No aggressive appearing osseous lesions. CT ABDOMEN PELVIS FINDINGS Hepatobiliary: Numerous irregular liver lesions, increased in size when compared with prior MRI. Reference lesion of the left hepatic lobe measures 3.5 x 2.7 cm on series 2, image 70, previously measured up to 1.8 cm. Reference lesion of the right hepatic lobe measures 2.2 x 2.0 cm on series 2, image 79, previously measured up to 1.0 cm .gallbladder is unremarkable. No biliary ductal dilation. Pancreas: Unremarkable. No pancreatic ductal dilatation or surrounding inflammatory changes. Spleen: Normal in  size without focal abnormality. Adrenals/Urinary Tract: Bilateral adrenal glands are unremarkable. No hydronephrosis. No nephrolithiasis. Mild bladder wall thickening. Stomach/Bowel: Stomach is within normal limits. Appendix appears normal. Diverticulosis. Mild wall thickening of the sigmoid colon, similar to prior PET-CT and likely sequela of chronic diverticulitis. No evidence of obstruction. Vascular/Lymphatic: Aortic atherosclerosis. No enlarged abdominal or pelvic lymph nodes. Reproductive: Prostatomegaly Other: No abdominal wall hernia or abnormality. No abdominopelvic ascites. Musculoskeletal: No acute or significant osseous findings. IMPRESSION: 1. Stable solid nodule of the right upper lobe with adjacent postradiation change and stable posttreatment changes of the left upper lobe. No evidence of progressive disease  in the chest. 2. Hepatic metastatic lesions are increased in size when compared with prior abdominal MRI. 3. Prostatomegaly with mild bladder wall thickening, findings can be seen in the setting of chronic bladder outlet obstruction. 4. Mild wall thickening of the sigmoid colon possibly related to diverticular disease and similar to prior PET-CT. Recommend colonoscopy if patient is not current for screening. 5. Aortic Atherosclerosis (ICD10-I70.0) and Emphysema (ICD10-J43.9). Electronically Signed   By: Yetta Glassman M.D.   On: 11/09/2022 16:42   IR GASTROSTOMY TUBE REMOVAL  Result Date: 11/04/2022 INDICATION: Patient history of small-cell lung cancer currently receiving chemoradiation and trach placement. Patient tried has been removed and he is eating and drinking normally. Request is for gastrostomy tube removal as it is no longer needed. EXAM: BEDSIDE REMOVAL OF GASTROSTOMY TUBE COMPARISON:  None Available. MEDICATIONS: Viscous lidocaine 10 mL CONTRAST:  None FLUOROSCOPY TIME:  None COMPLICATIONS: None immediate. PROCEDURE: Informed written consent was obtained from the patient and the patient's wife following explanation of the procedure, risks, benefits and alternatives. A time-out was performed prior to the initiation of the procedure. The track of the existing gastrostomy tube was lubricated with viscous lidocaine. Using manual traction, the existing pull-through gastrostomy tube was removed intact. Pressure was held at the site until hemostasis was achieved. A dressing was placed. The patient tolerated the procedure well without immediate postprocedural complication. IMPRESSION: Successful bedside removal of pull-through gastrostomy tube without complication. Read by: Narda Rutherford, AGNP-BC Electronically Signed   By: Ruthann Cancer M.D.   On: 11/04/2022 12:04     ASSESSMENT/PLAN:  This is a very pleasant 66 year old Caucasian male diagnosed with small cell lung cancer.  He was diagnosed in February  2023.  He presented with likely extensive stage disease (T1c, N1, M1 a).  He presented with bilateral nodules in the right upper lobe as well as a left upper lobe but this also could be treated with limited stage disease because of the proximity of the lesions.   The patient initially underwent cystoscopy chemotherapy with cisplatin 80 mg per metered squared on day 1 and etoposide 100 mg per metered squared on days 1, 2, and 3 IV every 3 weeks.  He status post 4 cycles of treatment.  He was admitted after cycle #1 for respiratory failure and multifocal pneumonia and radiation pneumonitis.  During from cycle 2 the patient's dose of cisplatin was reduced to 60 mg per metered squared on day 1 and etoposide 80 mg per metered squared.   He had restaging scans that showed improvement in the lung lesions but there was concerning hypodensity lesion in the left hepatic lobe concerning for metastasis.   Unfortunately, the patient had a relapse of disease shortly after the induction systemic chemotherapy.  The patient understands this is a poor prognostic factor.   The patient then underwent second line chemotherapy with stem cell,  2.6 mg per metered squared every 3 weeks.  First dose was on 09/08/2022.  He is status post 3 cycles.   At the patient's last appointment, the patient's restaging CT scan showed disease progression in the liver.  Dr. Julien Nordmann had a lengthy discussion with the patient about his current condition.  Dr. Julien Nordmann discussed he has a serious condition and it is incurable.  He was given the option of hospice versus trying third line chemotherapy.  The patient opted to try third line chemotherapy  The patient is currently undergoing chemotherapy with Irinotecan days 1 and 8 IV every 3 weeks. He is scheduled for cycle #1 today.   Labs were reviewed.  Recommend that he ***with day 8 cycle 1 today scheduled.  We will see him back for follow-up visit in 1 week for evaluation and repeat blood work  before undergoing day 8 cycle 1 and to manage any adverse side effects of treatment.   Overall, the patient understands that he has a poor prognosis. I referred the patient to the counselor at his last appointment. ***Reach out to [place]??   The patient was advised to call immediately if he has any concerning symptoms in the interval. The patient voices understanding of current disease status and treatment options and is in agreement with the current care plan. All questions were answered. The patient knows to call the clinic with any problems, questions or concerns. We can certainly see the patient much sooner if necessary          No orders of the defined types were placed in this encounter.     counseling the patient face to face. The total time spent in the appointment was {CHL ONC TIME VISIT - HGDJM:[id number]}.  Avanthika Dehnert L Kataleya Zaugg, PA-C 11/15/22

## 2022-11-16 MED FILL — Dexamethasone Sodium Phosphate Inj 100 MG/10ML: INTRAMUSCULAR | Qty: 1 | Status: AC

## 2022-11-17 ENCOUNTER — Inpatient Hospital Stay: Payer: Medicare Other

## 2022-11-17 ENCOUNTER — Telehealth: Payer: Self-pay | Admitting: Medical Oncology

## 2022-11-17 ENCOUNTER — Other Ambulatory Visit: Payer: Self-pay | Admitting: Internal Medicine

## 2022-11-17 ENCOUNTER — Emergency Department (HOSPITAL_COMMUNITY)
Admission: EM | Admit: 2022-11-17 | Discharge: 2022-11-17 | Disposition: A | Discharge status: Home / Self Care | Payer: Medicare Other | Attending: Emergency Medicine | Admitting: Emergency Medicine

## 2022-11-17 ENCOUNTER — Other Ambulatory Visit: Payer: Self-pay

## 2022-11-17 ENCOUNTER — Other Ambulatory Visit: Payer: Self-pay | Admitting: Radiology

## 2022-11-17 DIAGNOSIS — R1030 Lower abdominal pain, unspecified: Secondary | ICD-10-CM | POA: Diagnosis not present

## 2022-11-17 DIAGNOSIS — J449 Chronic obstructive pulmonary disease, unspecified: Secondary | ICD-10-CM | POA: Diagnosis not present

## 2022-11-17 DIAGNOSIS — R339 Retention of urine, unspecified: Secondary | ICD-10-CM | POA: Insufficient documentation

## 2022-11-17 DIAGNOSIS — I10 Essential (primary) hypertension: Secondary | ICD-10-CM | POA: Diagnosis not present

## 2022-11-17 DIAGNOSIS — C3412 Malignant neoplasm of upper lobe, left bronchus or lung: Secondary | ICD-10-CM

## 2022-11-17 LAB — URINALYSIS, ROUTINE W REFLEX MICROSCOPIC
Bilirubin Urine: NEGATIVE
Glucose, UA: NEGATIVE mg/dL
Hgb urine dipstick: NEGATIVE
Ketones, ur: NEGATIVE mg/dL
Leukocytes,Ua: NEGATIVE
Nitrite: NEGATIVE
Protein, ur: NEGATIVE mg/dL
Specific Gravity, Urine: 1.008 (ref 1.005–1.030)
pH: 6 (ref 5.0–8.0)

## 2022-11-17 MED ORDER — ZINC OXIDE 12.8 % EX OINT
TOPICAL_OINTMENT | Freq: Two times a day (BID) | CUTANEOUS | Status: DC
  Filled 2022-11-17: qty 56.7

## 2022-11-17 MED ORDER — DOXYCYCLINE HYCLATE 100 MG PO TABS: 100.0000 mg | ORAL_TABLET | Freq: Two times a day (BID) | ORAL | 1 refills | Status: DC

## 2022-11-17 NOTE — Discharge Instructions (Addendum)
You were seen in the emergency room today with inability to urinate.  We have placed a Foley catheter which will need to stay in place.  We have shown you how to manage this at home and will have you call the urology office listed to schedule a follow-up appointment where they will attempt to remove the catheter and have you urinate on your own.  Please continue to manage your constipation as this may be contributing to this.  I did reach out to your oncologist, and they have rescheduled you for next week.  You can reach out to the office directly to confirm this.   You were also seen by the interventional radiology team.  They have called in a prescription for doxycycline to your pharmacy which you can start today.  I have included information about dressing changes below.  Instructions for dressing changes: Gently wash former feeding tube site with soap and water, pat dry. Apply a liberal layer of Triple Paste to the reddened areas. Place a Drawtex dressing over the former tube site and tape in place. Change twice daily and prn dressing saturation.

## 2022-11-17 NOTE — Telephone Encounter (Signed)
Med onc appt -11/27 He is  home  and has a foley cath . He needs to call urology this week.   I told pt to keep appt 11/27 and  to call us back after he talks to urology- and take doxycycline as prescribed.

## 2022-11-17 NOTE — Telephone Encounter (Addendum)
EMS taking pt to ED. [name] , S.O . said pt was weak ,  not voiding , feeding tube site " infected".

## 2022-11-17 NOTE — Consult Note (Signed)
WOC Nurse Consult Note: Patient receiving care in [facility] ED 7. Reason for Consult: excoriation at G-tube removal site. Wound type: contact dermatitis related to leakage from former G-Tube site Pressure Injury POA: Yes/No/NA Measurement: Wound bed: reddened Drainage (amount, consistency, odor) yellow Periwound: reddened Dressing procedure/placement/frequency: Gently wash former feeding tube site with soap and water, pat dry. Apply a liberal layer of Triple Paste to the reddened areas. Place a Drawtex dressing Kellie Simmering [phone]) over the former tube site and tape in place. Change twice daily and prn dressing saturation.  [place] nurse will not follow at this time.  Please re-consult the [place] team if needed.  Val Riles, RN, MSN, CWOCN, CNS-BC, pager [phone]

## 2022-11-17 NOTE — ED Provider Notes (Signed)
Emergency Department Provider Note   I have reviewed the triage vital signs and the nursing notes.   HISTORY  Chief Complaint Urinary Retention   HPI Mark Yu is a 66 y.o. male past history of hypertension, COPD, metastatic small cell lung cancer currently on chemotherapy presents to the emergency department with inability to urinate.  He has not had urination since yesterday.  He is having some lower abdominal discomfort.  No history of this in the past.  He has never required a Foley catheter to be placed except during surgery.  He also states he is having some discomfort over his prior G-tube site.  The G-tube was placed by interventional radiology in April and was removed on 11/6.  He states he was told the area should close but has had persistent drainage and leaking along with discomfort.  No fevers or chills.  No chest pain or shortness of breath.  Past Medical History:  Diagnosis Date   Anxiety    tx xanax   COPD (chronic obstructive pulmonary disease) ([place])    tx inhalers   Depression    Dyspnea    with exertion   HTN (hypertension)    tx amlodipine   Hyponatremia 03/08/2022    Review of Systems  Constitutional: No fever/chills Cardiovascular: Denies chest pain. Respiratory: Denies shortness of breath. Gastrointestinal: Positive epigastric abdominal pain.  No nausea, no vomiting.  No diarrhea.  No constipation. Genitourinary: Positive urinary retention.  Musculoskeletal: Negative for back pain. Skin: Negative for rash. Neurological: Negative for headaches.  ____________________________________________   PHYSICAL EXAM:  VITAL SIGNS: ED Triage Vitals [11/17/22 0837]  Enc Vitals Group     BP (!) 175/109     Pulse Rate (!) 112     Resp 18     Temp 97.8 F (36.6 C)     Temp Source Oral     SpO2 92 %   Constitutional: Alert and oriented. Well appearing and in no acute distress. Eyes: Conjunctivae are normal.  Head: Atraumatic. Nose: No  congestion/rhinnorhea. Mouth/Throat: Mucous membranes are moist.  Neck: No stridor.  Cardiovascular: Normal rate, regular rhythm. Good peripheral circulation. Grossly normal heart sounds.   Respiratory: Normal respiratory effort.  No retractions. Lungs CTAB. Gastrointestinal: Soft and nontender. No distention.  G-tube stoma visualized with some discharge on the overlying gauze.  Mild skin erythema under the gauze but not extending beyond this. No induration.  Genitourinary: Male external genitalia.  Nurse chaperone present at the time of exam.  Musculoskeletal: No lower extremity tenderness nor edema. No gross deformities of extremities. Neurologic:  Normal speech and language. No gross focal neurologic deficits are appreciated.  Skin:  Skin is warm, dry and intact. No rash noted.  ____________________________________________   LABS (all labs ordered are listed, but only abnormal results are displayed)  Labs Reviewed  URINE CULTURE  URINALYSIS, ROUTINE W REFLEX MICROSCOPIC   ____________________________________________   PROCEDURES  Procedure(s) performed:   Procedures  None ____________________________________________   INITIAL IMPRESSION / ASSESSMENT AND PLAN / ED COURSE  Pertinent labs & imaging results that were available during my care of the patient were reviewed by me and considered in my medical decision making (see chart for details).   This patient is Presenting for Evaluation of abdominal pain, which does require a range of treatment options, and is a complaint that involves a high risk of morbidity and mortality.  The Differential Diagnoses include urinary retention, metastatic disease to the abdomen, complication from G-tube, abscess, etc.  I decided to review pertinent External Data, and in summary patient with G-tube placed in April of this year and removed on 11/6.  The G-tube was managed by interventional radiology.   Clinical Laboratory Tests Ordered,  included UA without sign of infection.   Radiologic Tests: Stated advanced imaging of the men but overall the patient's exam is reassuring without focal tenderness.  He has some erythema under the dressing overlying his G-tube stoma but no palpable hernia, clear abscess, severe cellulitis. Defer imaging for now.   Cardiac Monitor Tracing which shows NSR.   Social Determinants of Health Risk patient is not an active smoker.   Consult complete with interventional radiology.  They came to evaluate the G-tube site.  We will start doxycycline as an outpatient which they have ordered and will have wound care come for bedside evaluation and dressing recommendations.   Spoke with Dr. Earlie Server with oncology.  He will reschedule the patient's chemotherapy for next week.   Medical Decision Making: Summary:  Patient presents to the emergency department for evaluation of of lower abdominal pain and urinary retention.  Foley catheter was placed without difficulty and will remain in place with plan for urology follow-up.  Patient also with persistent drainage from his G-tube site with pain and irritation.  On my exam there is some mucousy discharge from the stoma but no clear sign of infection.  There are some surrounding skin irritation, likely from increased moisture to the area, but no bleeding, ulceration, abscess.  Will reach out to interventional radiology to discuss management moving forward.  Reevaluation with update and discussion with patient.  Gust leaving the Foley catheter and the need for void trial with urology.  Disposition: discharge  ____________________________________________  FINAL CLINICAL IMPRESSION(S) / ED DIAGNOSES  Final diagnoses:  Urinary retention     NEW OUTPATIENT MEDICATIONS STARTED DURING THIS VISIT:  Discharge Medication List as of 11/17/2022 12:14 PM     START taking these medications   Details  doxycycline (VIBRA-TABS) 100 MG tablet Take 1 tablet (100 mg  total) by mouth 2 (two) times daily., Starting Tue 11/17/2022, Normal        Note:  This document was prepared using Dragon voice recognition software and may include unintentional dictation errors.  Nanda Quinton, MD, [facility] Emergency Medicine    Tymeer Vaquera, Wonda [facility], MD 11/25/22 [phone]

## 2022-11-17 NOTE — Progress Notes (Signed)
Called to assess G-tube removal site. G-tube removed on 49/1 without complication. Site looked normal at that time. Pt reports has had ongoing leakage from site, which has caused pain and irritation at skin site. Reports occasionally hearing some bubbles and drainage after eating. He came to ED today for urinary retention but brought  this to the EDP attention. BP 124/85   Pulse (!) 102   Temp 97.8 F (36.6 C) (Oral)   Resp 18   SpO2 98%  Skin very raw and excoriated 1-2 cm around hole. Scant dark drainage noted. CT from 11/13 reviewed. Tract appears to be sealed, no abscess noted.   Given ongoing drainage, suspect tract not completely sealed. Unfortunately nothing really can be done to speed up that process. Encourage smaller more frequent meals and to rest in reclined position after eating and drinking to try and minimize leaking. Will ask [facility]OC[facility] team to eval for dressing recommendations, ideally try to keep area as dry as possible, may require frequent dressing changes by pt. Will order Doxycyline for 10 days as well to cover any cellulitis component.  Ascencion Dike PA-C Interventional Radiology 11/17/2022 10:22 AM

## 2022-11-17 NOTE — ED Triage Notes (Signed)
EMS reports from home, c/o urinary retention since yesterday. Had left upper ab GI tube removed last week and c/o pain at site.  BP 135/87 HR 108 RR 18 Sp02 96 RA

## 2022-11-18 ENCOUNTER — Encounter: Payer: Self-pay | Admitting: Internal Medicine

## 2022-11-18 ENCOUNTER — Telehealth: Payer: Self-pay | Admitting: Internal Medicine

## 2022-11-18 LAB — URINE CULTURE: Culture: NO GROWTH

## 2022-11-18 NOTE — Telephone Encounter (Signed)
Called patient regarding rescheduled 11/28 appointment, patient is notified.

## 2022-11-19 ENCOUNTER — Other Ambulatory Visit: Payer: Self-pay

## 2022-11-23 ENCOUNTER — Inpatient Hospital Stay: Payer: Medicare Other

## 2022-11-23 ENCOUNTER — Other Ambulatory Visit: Payer: Self-pay

## 2022-11-23 ENCOUNTER — Telehealth: Payer: Self-pay

## 2022-11-23 ENCOUNTER — Inpatient Hospital Stay: Payer: Medicare Other | Admitting: Physician Assistant

## 2022-11-23 ENCOUNTER — Emergency Department (HOSPITAL_COMMUNITY)
Admission: EM | Admit: 2022-11-23 | Discharge: 2022-11-24 | Disposition: A | Discharge status: Home / Self Care | Payer: Medicare Other | Attending: Emergency Medicine | Admitting: Emergency Medicine

## 2022-11-23 ENCOUNTER — Encounter (HOSPITAL_COMMUNITY): Payer: Self-pay

## 2022-11-23 ENCOUNTER — Telehealth: Payer: Self-pay | Admitting: Medical Oncology

## 2022-11-23 DIAGNOSIS — Z978 Presence of other specified devices: Secondary | ICD-10-CM

## 2022-11-23 DIAGNOSIS — K632 Fistula of intestine: Secondary | ICD-10-CM | POA: Diagnosis not present

## 2022-11-23 DIAGNOSIS — J449 Chronic obstructive pulmonary disease, unspecified: Secondary | ICD-10-CM | POA: Insufficient documentation

## 2022-11-23 DIAGNOSIS — E860 Dehydration: Secondary | ICD-10-CM | POA: Insufficient documentation

## 2022-11-23 DIAGNOSIS — I10 Essential (primary) hypertension: Secondary | ICD-10-CM | POA: Insufficient documentation

## 2022-11-23 DIAGNOSIS — Z87891 Personal history of nicotine dependence: Secondary | ICD-10-CM | POA: Insufficient documentation

## 2022-11-23 DIAGNOSIS — R109 Unspecified abdominal pain: Secondary | ICD-10-CM | POA: Diagnosis present

## 2022-11-23 DIAGNOSIS — K316 Fistula of stomach and duodenum: Secondary | ICD-10-CM

## 2022-11-23 LAB — CBC WITH DIFFERENTIAL/PLATELET
Abs Immature Granulocytes: 0.09 10*3/uL — ABNORMAL HIGH (ref 0.00–0.07)
Basophils Absolute: 0 10*3/uL (ref 0.0–0.1)
Basophils Relative: 0 %
Eosinophils Absolute: 0 10*3/uL (ref 0.0–0.5)
Eosinophils Relative: 0 %
HCT: 39.9 % (ref 39.0–52.0)
Hemoglobin: 12.6 g/dL — ABNORMAL LOW (ref 13.0–17.0)
Immature Granulocytes: 1 %
Lymphocytes Relative: 6 %
Lymphs Abs: 0.6 10*3/uL — ABNORMAL LOW (ref 0.7–4.0)
MCH: 31.7 pg (ref 26.0–34.0)
MCHC: 31.6 g/dL (ref 30.0–36.0)
MCV: 100.5 fL — ABNORMAL HIGH (ref 80.0–100.0)
Monocytes Absolute: 0.8 10*3/uL (ref 0.1–1.0)
Monocytes Relative: 9 %
Neutro Abs: 7.6 10*3/uL (ref 1.7–7.7)
Neutrophils Relative %: 84 %
Platelets: 277 10*3/uL (ref 150–400)
RBC: 3.97 MIL/uL — ABNORMAL LOW (ref 4.22–5.81)
RDW: 16.5 % — ABNORMAL HIGH (ref 11.5–15.5)
WBC: 9.2 10*3/uL (ref 4.0–10.5)
nRBC: 0 % (ref 0.0–0.2)

## 2022-11-23 LAB — BASIC METABOLIC PANEL
Anion gap: 13 (ref 5–15)
BUN: 27 mg/dL — ABNORMAL HIGH (ref 8–23)
CO2: 23 mmol/L (ref 22–32)
Calcium: 9.2 mg/dL (ref 8.9–10.3)
Chloride: 95 mmol/L — ABNORMAL LOW (ref 98–111)
Creatinine, Ser: 0.59 mg/dL — ABNORMAL LOW (ref 0.61–1.24)
GFR, Estimated: >60 mL/min (ref >60)
Glucose, Bld: 96 mg/dL (ref 70–99)
Potassium: 3.5 mmol/L (ref 3.5–5.1)
Sodium: 131 mmol/L — ABNORMAL LOW (ref 135–145)

## 2022-11-23 LAB — PROTIME-INR
INR: 1 (ref 0.8–1.2)
Prothrombin Time: 13.3 seconds (ref 11.4–15.2)

## 2022-11-23 LAB — APTT: aPTT: 29 seconds (ref 24–36)

## 2022-11-23 MED FILL — Dexamethasone Sodium Phosphate Inj 100 MG/10ML: INTRAMUSCULAR | Qty: 1 | Status: AC

## 2022-11-23 NOTE — Telephone Encounter (Signed)
Voicemail message received from this patient stating that his heart rate is 157 bpm.  Patient requesting to know if he should come in for his office visit today or if he should call 911.  This nurse spoke with the provider who states if the patients Heart rate is 157 he needs to be evaluated at the emergency room.  This nurse attempting to reach patient, left a message with provider recommendation.  This nurse will attempt to reach patient again.

## 2022-11-23 NOTE — ED Provider Triage Note (Signed)
Emergency Medicine Provider Triage Evaluation Note  Mark Yu , a 66 y.o. male  was evaluated in triage.  Pt complains of bleeding from feeding tube site.  States he got to taken out 4 weeks ago and has had constant gastric content fluid drainage since that time.  States it started to bleed approximately 2 hours prior to arrival.  Has constant pain around the site with the drainage.  States he took a 10 mg Valium and 400 mg of ibuprofen prior to arrival which decreased the pain.  Was seen recently for urinary retention and possible UTI.  Has not been able to contact the urology clinic due to the holiday but wants the Foley catheter removed  Review of Systems  Positive: As above Negative: As above  Physical Exam  BP (!) 135/101 (BP Location: Left Arm)   Pulse (!) 122   Temp 98.3 F (36.8 C) (Oral)   Resp 18   SpO2 100%  Gen:   Awake, no distress   Resp:  Normal effort  MSK:   Moves extremities without difficulty  Other:  Bandage overlying the gastric tube insertion site with some blood noted.  No active bleeding  Medical Decision Making  Medically screening exam initiated at 7:05 PM.  Appropriate orders placed.  Mark Yu was informed that the remainder of the evaluation will be completed by another provider, this initial triage assessment does not replace that evaluation, and the importance of remaining in the ED until their evaluation is complete.     Mark Yu, [place] 11/23/22 1905

## 2022-11-23 NOTE — Telephone Encounter (Signed)
(  11:35 am) PC SW scheduled an initial visit with patient. RN/SW team scheduled to see patient at his home on 11/25/22@ 9:30 am.

## 2022-11-23 NOTE — Telephone Encounter (Signed)
This nurse was able to reach patient and he stated that the elevated heart rate decreased once he sat down. However, patient is very concerned that the site where is G-Tube was removed is note healing and is bleeding, he complains of pain at the site, patient states that he has not been able to eat, and is having difficulty walking,  patient states that he has a urinary catheter in place and his urine is getting darker and darker.  This nurse advised patient that he needs to be seen at the ED for evaluation.  Patient states that he will call EMS because he is unable to walk to the car.  No further concerns or questions noted.

## 2022-11-23 NOTE — Telephone Encounter (Signed)
This nurse called patients significant other.  Left a message to verify if patient did go to the emergency room for elevated heart rate.  Request a call back with patient status.  No further concerns at this time.

## 2022-11-23 NOTE — Telephone Encounter (Signed)
err

## 2022-11-23 NOTE — ED Triage Notes (Signed)
Patient BIB [facility] from home. Feeding tube was taken out 3 weeks ago. Was worried about the way it was healing. On antibiotics currently as he is being treated for pneumonia. Bleeding around the site.

## 2022-11-24 ENCOUNTER — Other Ambulatory Visit: Payer: Self-pay | Admitting: Radiology

## 2022-11-24 ENCOUNTER — Ambulatory Visit: Payer: Medicare Other

## 2022-11-24 ENCOUNTER — Emergency Department (HOSPITAL_COMMUNITY): Payer: Medicare Other

## 2022-11-24 ENCOUNTER — Encounter (HOSPITAL_COMMUNITY): Payer: Self-pay

## 2022-11-24 ENCOUNTER — Inpatient Hospital Stay: Payer: Medicare Other

## 2022-11-24 DIAGNOSIS — C3412 Malignant neoplasm of upper lobe, left bronchus or lung: Secondary | ICD-10-CM

## 2022-11-24 LAB — HEPATIC FUNCTION PANEL
ALT: 19 U/L (ref 0–44)
AST: 26 U/L (ref 15–41)
Albumin: 4.4 g/dL (ref 3.5–5.0)
Alkaline Phosphatase: 120 U/L (ref 38–126)
Bilirubin, Direct: 0.2 mg/dL (ref 0.0–0.2)
Indirect Bilirubin: 0.8 mg/dL (ref 0.3–0.9)
Total Bilirubin: 1 mg/dL (ref 0.3–1.2)
Total Protein: 7.4 g/dL (ref 6.5–8.1)

## 2022-11-24 MED ORDER — OMEPRAZOLE MAGNESIUM 20 MG PO TBEC: 20.0000 mg | DELAYED_RELEASE_TABLET | Freq: Two times a day (BID) | ORAL | 3 refills | Status: AC

## 2022-11-24 MED ORDER — LACTATED RINGERS IV BOLUS
500.0000 mL | Freq: Once | INTRAVENOUS | Status: AC
  Administered 2022-11-24: 500 mL via INTRAVENOUS

## 2022-11-24 MED ORDER — LIDOCAINE HCL URETHRAL/MUCOSAL 2 % EX GEL: 1.0000 | Freq: Four times a day (QID) | CUTANEOUS | 0 refills | Status: AC | PRN

## 2022-11-24 MED ORDER — DOXYCYCLINE HYCLATE 100 MG PO TABS: 100.0000 mg | ORAL_TABLET | Freq: Two times a day (BID) | ORAL | 1 refills | Status: AC

## 2022-11-24 MED ORDER — PROCHLORPERAZINE MALEATE 10 MG PO TABS: 10.0000 mg | ORAL_TABLET | Freq: Four times a day (QID) | ORAL | 1 refills | Status: AC | PRN

## 2022-11-24 MED ORDER — PANTOPRAZOLE SODIUM 20 MG PO TBEC: 40.0000 mg | DELAYED_RELEASE_TABLET | Freq: Every day | ORAL | 0 refills | Status: AC

## 2022-11-24 MED ORDER — PANTOPRAZOLE SODIUM 40 MG PO TBEC
40.0000 mg | DELAYED_RELEASE_TABLET | Freq: Once | ORAL | Status: AC
  Administered 2022-11-24: 40 mg via ORAL
  Filled 2022-11-24: qty 1

## 2022-11-24 MED ORDER — SODIUM CHLORIDE 0.9 % IV BOLUS
1000.0000 mL | Freq: Once | INTRAVENOUS | Status: AC
  Administered 2022-11-24: 1000 mL via INTRAVENOUS

## 2022-11-24 MED ORDER — IOHEXOL 300 MG/ML  SOLN
100.0000 mL | Freq: Once | INTRAMUSCULAR | Status: AC | PRN
  Administered 2022-11-24: 100 mL via INTRAVENOUS

## 2022-11-24 MED ORDER — HYDROMORPHONE HCL 1 MG/ML IJ SOLN
0.5000 mg | Freq: Once | INTRAMUSCULAR | Status: AC
  Administered 2022-11-24: 0.5 mg via INTRAVENOUS
  Filled 2022-11-24: qty 1

## 2022-11-24 MED ORDER — LOPERAMIDE HCL 2 MG PO CAPS: ORAL_CAPSULE | ORAL | 1 refills | Status: AC

## 2022-11-24 MED ORDER — SODIUM CHLORIDE (PF) 0.9 % IJ SOLN
INTRAMUSCULAR | Status: AC
  Filled 2022-11-24: qty 50

## 2022-11-24 NOTE — Progress Notes (Signed)
Supervising Physician: Mir, Biochemist, clinical  Patient Status:  [facility] - In-pt  Chief Complaint:  Delayed healing at prior gastrostomy tube exit site  Subjective:  Patient reports leaking from the prior gastrostomy tube site. Condition is made worse with eating and drinking. Condition is made better with protonix.   Allergies: Amoxicillin and Macrolides and ketolides  Medications: Prior to Admission medications   Medication Sig Start Date End Date Taking? Authorizing Provider  acetaminophen (TYLENOL) 325 MG tablet 1 tablet as needed Orally every 4 hrs Patient not taking: Reported on 11/10/2022    [provider]  albuterol (PROVENTIL) (2.5 MG/3ML) 0.083% nebulizer solution Take 2.5 mg by nebulization every 6 (six) hours as needed for wheezing or shortness of breath.    [provider]  ALPRAZolam Duanne Moron) 1 MG tablet TAKE 1/2 TO 1 TABLET BY MOUTH TWO TIMES A DAY AS NEEDED FOR ANXIETY 04/22/22   Martyn Ehrich, NP  amLODipine (NORVASC) 5 MG tablet Take 5 mg by mouth daily. 12/28/21   [provider]  BREZTRI AEROSPHERE 160-9-4.8 MCG/ACT AERO Inhale 2 puffs into the lungs in the morning and at bedtime. 01/02/22   [provider]  dextromethorphan-guaiFENesin (ROBITUSSIN-DM) 10-100 MG/5ML liquid Take 10 mLs by mouth 2 (two) times daily.    [provider]  doxycycline (VIBRA-TABS) 100 MG tablet Take 1 tablet (100 mg total) by mouth 2 (two) times daily. 11/17/22   Ascencion Dike, PA-C  fluticasone (FLONASE) 50 MCG/ACT nasal spray Place 2 sprays into both nostrils daily as needed for allergies or rhinitis.    [provider]  lidocaine-prilocaine (EMLA) cream Apply to the Port-A-Cath site 30-60 minutes before chemotherapy 02/09/22   Curt Bears, MD  loperamide (IMODIUM) 2 MG capsule Take 2 tabs by mouth with first loose stool, then 1 tab with each additional loose stool as needed. Do not exceed 8 tabs in a 24-hour period 11/24/22   Curt Bears,  MD  Nutritional Supplements (FEEDING SUPPLEMENT, OSMOLITE 1.5 CAL,) LIQD Place 1,000 mLs into feeding tube continuous. 04/17/22   Charlynne Cousins, MD  pantoprazole (PROTONIX) 40 MG tablet Take 40 mg by mouth daily. 05/13/22   [provider]  predniSONE (DELTASONE) 10 MG tablet TAKE TWO TABLETS BY MOUTH DAILY WITH BREAKFAST 09/01/22   Byrum, Rose Fillers, MD  prochlorperazine (COMPAZINE) 10 MG tablet Take 1 tablet (10 mg total) by mouth every 6 (six) hours as needed for nausea or vomiting. 11/24/22   Curt Bears, MD  sildenafil (REVATIO) 20 MG tablet 1 tablet Orally Once a day for 90 days Patient not taking: Reported on 11/10/2022 10/01/22 01/31/23  [provider]  Water For Irrigation, Sterile (FREE WATER) SOLN Place 150 mLs into feeding tube 6 (six) times daily. 04/17/22   Charlynne Cousins, MD  ZANAFLEX 4 MG tablet 1 tablet as needed Orally every 6 hours for 30 days 11/05/22   [provider]     Vital Signs: BP (!) 148/95   Pulse 94   Temp 98 F (36.7 C)   Resp 16   Ht 5\' 11"  (1.803 m)   Wt 112 lb (50.8 kg)   SpO2 100%   BMI 15.62 kg/m   Physical Exam Vitals and nursing note reviewed.  Constitutional:      Appearance: He is well-developed. He is ill-appearing.  HENT:     Head: Normocephalic.  Pulmonary:     Effort: Pulmonary effort is normal.  Abdominal:     Comments: RLQ prior gastrostomy tube exit site.  Excoriation and erythema noted around the exit. No active leaking appreciated. Site decreased in size.  Musculoskeletal:        General: Normal range of motion.     Cervical back: Normal range of motion.  Skin:    General: Skin is dry.     Findings: Bruising (extenisve to bilateral upper extremities.) present.  Neurological:     Mental Status: He is alert and oriented to person, place, and time.  Psychiatric:        Mood and Affect: Mood normal.        Behavior: Behavior normal.        Thought Content: Thought content normal.         Judgment: Judgment normal.     Imaging: CT ABDOMEN PELVIS W CONTRAST  Result Date: 11/24/2022 CLINICAL DATA:  Postoperative abdominal pain, question intracutaneous fistula or complication. History of feeding tube removal 3 weeks ago with bleeding at site. EXAM: CT ABDOMEN AND PELVIS WITH CONTRAST TECHNIQUE: Multidetector CT imaging of the abdomen and pelvis was performed using the standard protocol following bolus administration of intravenous contrast. RADIATION DOSE REDUCTION: This exam was performed according to the departmental dose-optimization program which includes automated exposure control, adjustment of the mA and/or kV according to patient size and/or use of iterative reconstruction technique. CONTRAST:  131mL OMNIPAQUE IOHEXOL 300 MG/ML  SOLN COMPARISON:  Eighteen days ago FINDINGS: Lower chest: Emphysema in minimally covered scarring in the lower lobes. No acute finding Hepatobiliary: Scattered hepatic metastases as recently staged. The largest lesion in the left lobe liver measures 4.2 cm on today's postcontrast imaging. No superimposed acute finding.No evidence of biliary obstruction or stone. Pancreas: Unremarkable. Spleen: Unremarkable. Adrenals/Urinary Tract: Negative adrenals. No hydronephrosis or stone. The bladder is collapsed around a Foley catheter. Stomach/Bowel: Enhancing tract at site of prior percutaneous gastrostomy tube. No collection, inflammation, or visible cause of bleeding. Negative for bowel obstruction. Distal colonic diverticulosis. Vascular/Lymphatic: No acute vascular abnormality. Confluent atheromatous calcification of the aorta and its branches. Extensive atheromatous narrowing of the iliacs and superficial femoral arteries. No mass or adenopathy. Reproductive:No pathologic findings. Other: No ascites or pneumoperitoneum. Musculoskeletal: Left inferior pubic ramus fracture extending towards ischial tuberosity with periosteal reaction. Generalized osteopenia.  IMPRESSION: 1. Expected appearance of abandoned percutaneous gastrostomy tube site. No abscess or inflammation. 2. Healing left inferior pubic ramus fracture. 3. Known hepatic metastatic disease. Electronically Signed   By: Jorje Guild M.D.   On: 11/24/2022 04:39    Labs:  CBC: Recent Labs    10/26/22 0850 11/02/22 0854 11/10/22 0959 11/23/22 1855  WBC 6.2 2.2* 7.1 9.2  HGB 9.8* 10.3* 11.1* 12.6*  HCT 29.9* 31.8* 33.2* 39.9  PLT 125* 145* 221 277    COAGS: Recent Labs    03/21/22 0027 11/23/22 1855  INR 1.2 1.0  APTT  --  29    BMP: Recent Labs    10/26/22 0850 11/02/22 0854 11/10/22 0959 11/23/22 1855  NA 138 141 139 131*  K 3.9 3.4* 3.7 3.5  CL 104 103 101 95*  CO2 29 31 30 23   GLUCOSE 101* 111* 100* 96  BUN 19 12 13  27*  CALCIUM 8.9 9.2 9.1 9.2  CREATININE 0.56* 0.60* 0.74 0.59*  GFRNONAA >60 >60 >60 >60    LIVER FUNCTION TESTS: Recent Labs    10/26/22 0850 11/02/22 0854 11/10/22 0959 11/23/22 1855  BILITOT 0.3 0.4 0.6 1.0  AST 12* 11* 15 26  ALT 11 10 10 19   ALKPHOS 48 49  55 120  PROT 6.4* 6.3* 7.0 7.4  ALBUMIN 3.9 4.0 4.4 4.4    Assessment and Plan:  66 y.o. male outpatient (In the ED) History of metastatic small cell lung cancer currently on second line systemic chemotherapy. S/p 24 Fr pull through gastrostomy tube placed by IR on  4.7.23. Tube was removed on 11.8.23. Patient has had ongoing leaking from the site since gastrostomy tube removal approximately 3 weeks ago. Condition is made worse with eating, drinking and palpation. Condition is made better with protonix.  Per Patient he has been keeping the area clean with wet washcloth and applying 3 triple ointment around the site on daily.  Endorses abdominal pain. Declines fever, SHOB.   Case discussed with IR Attending Dr. Wanda Plump. Mir. Clinical options were discussed with Mr Guggenheim including the placement of  a smaller gastrostomy tube. This would allow the exit site to decrease slowly by  secondary intention. The second option is  to continue the path of natural healing. After discussion with the Patient regarding these options Mr Cordts has elected to continue on the path of natural healing.  Plan: - Smaller more frequent meals & rest in reclined position after eating & drinking - Change dressing often.  - Continue applying triple antibiotic ointment with zinc - Take Prilosec 20 mg BID - Follow up with IR in 1 week. Schedulers will call with an appointment - Doxycycline has been reordered for prophylactic antibiotic coverage  Patient verbally understanding and is in agreement with the plan of care.  Electronically Signed: Jacqualine Mau, NP 11/24/2022, 9:54 AM   I spent a total of 15 Minutes at the patient's bedside AND on the patient's hospital floor or unit, greater than 50% of which was counseling/coordinating care for delayed healing of gastrostomy exit site.

## 2022-11-24 NOTE — ED Provider Notes (Signed)
[facility] Emergency Department Provider Note MRN:  [id number]  Arrival date & time: 11/24/22     Chief Complaint   Abdominal pain History of Present Illness   Mark Yu is a 66 y.o. year-old male with a history of metastatic lung cancer presenting to the ED with chief complaint of abdominal pain.  Patient explains that his feeding tube was removed about 3 weeks ago and it has been leaking/draining ever since.  Has gotten a lot worse over the past 2 or 3 days, leaks copiously with any p.o. intake.  Denies fever, the site is very tender and painful.  Review of Systems  A thorough review of systems was obtained and all systems are negative except as noted in the HPI and PMH.   Patient's Health History    Past Medical History:  Diagnosis Date   Anxiety    tx xanax   COPD (chronic obstructive pulmonary disease) (HCC)    tx inhalers   Depression    Dyspnea    with exertion   HTN (hypertension)    tx amlodipine   Hyponatremia 03/08/2022    Past Surgical History:  Procedure Laterality Date   BRONCHIAL BIOPSY  02/02/2022   Procedure: BRONCHIAL BIOPSIES;  Surgeon: Collene Gobble, MD;  Location: [facility] ENDOSCOPY;  Service: Pulmonary;;   BRONCHIAL BRUSHINGS  02/02/2022   Procedure: BRONCHIAL BRUSHINGS;  Surgeon: Collene Gobble, MD;  Location: [place];  Service: Pulmonary;;   BRONCHIAL NEEDLE ASPIRATION BIOPSY  02/02/2022   Procedure: BRONCHIAL NEEDLE ASPIRATION BIOPSIES;  Surgeon: Collene Gobble, MD;  Location: [place] ENDOSCOPY;  Service: Pulmonary;;   IR GASTROSTOMY TUBE MOD SED  04/03/2022   IR GASTROSTOMY TUBE REMOVAL  11/04/2022   IR IMAGING GUIDED PORT INSERTION  02/24/2022   MRI     NO PAST SURGERIES     VIDEO BRONCHOSCOPY WITH ENDOBRONCHIAL ULTRASOUND N/A 02/02/2022   Procedure: VIDEO BRONCHOSCOPY WITH ENDOBRONCHIAL ULTRASOUND;  Surgeon: Collene Gobble, MD;  Location: [place] ENDOSCOPY;  Service: Pulmonary;  Laterality: N/A;   VIDEO BRONCHOSCOPY WITH RADIAL  ENDOBRONCHIAL ULTRASOUND  02/02/2022   Procedure: VIDEO BRONCHOSCOPY WITH RADIAL ENDOBRONCHIAL ULTRASOUND;  Surgeon: Collene Gobble, MD;  Location: [facility] ENDOSCOPY;  Service: Pulmonary;;    Family History  Problem Relation Age of Onset   Alzheimer's disease Mother    Multiple sclerosis Father     Social History   Socioeconomic History   Marital status: Significant Other    Spouse name: Not on file   Number of children: Not on file   Years of education: Not on file   Highest education level: Not on file  Occupational History   Not on file  Tobacco Use   Smoking status: Former    Packs/day: 0.25    Years: 50.00    Total pack years: 12.50    Types: Cigarettes    Quit date: 03/08/2022    Years since quitting: 0.7   Smokeless tobacco: Not on file   Tobacco comments:    Quite 3.5 week agos. 05/15/22 ARJ   Vaping Use   Vaping Use: Never used  Substance and Sexual Activity   Alcohol use: Yes    Alcohol/week: 30.0 standard drinks of alcohol    Types: 30 Cans of beer per week   Drug use: Yes    Types: Marijuana    Comment: Last use in 03/2021   Sexual activity: Yes  Other Topics Concern   Not on file  Social History Narrative  Not on file   Social Determinants of Health   Financial Resource Strain: Not on file  Food Insecurity: Not on file  Transportation Needs: Not on file  Physical Activity: Not on file  Stress: Not on file  Social Connections: Not on file  Intimate Partner Violence: Not At Risk (02/11/2022)   Humiliation, Afraid, Rape, and Kick questionnaire    Fear of Current or Ex-Partner: No    Emotionally Abused: No    Physically Abused: No    Sexually Abused: No     Physical Exam   Vitals:   11/24/22 0445 11/24/22 0545  BP: (!) 166/111 (!) 159/98  Pulse: (!) 106 95  Resp: 18 20  Temp:    SpO2: 94% 98%    CONSTITUTIONAL: Chronically ill-appearing, NAD NEURO/PSYCH:  Alert and oriented x 3, no focal deficits EYES:  eyes equal and reactive ENT/NECK:  no  LAD, no JVD CARDIO: Regular rate, well-perfused, normal S1 and S2 PULM:  CTAB no wheezing or rhonchi GI/GU:  non-distended, non-tender MSK/SPINE:  No gross deformities, no edema SKIN:  no rash, atraumatic   *Additional and/or pertinent findings included in MDM below  Diagnostic and Interventional Summary    EKG Interpretation  Date/Time:    Ventricular Rate:    PR Interval:    QRS Duration:   QT Interval:    QTC Calculation:   R Axis:     Text Interpretation:         Labs Reviewed  BASIC METABOLIC PANEL - Abnormal; Notable for the following components:      Result Value   Sodium 131 (*)    Chloride 95 (*)    BUN 27 (*)    Creatinine, Ser 0.59 (*)    All other components within normal limits  CBC WITH DIFFERENTIAL/PLATELET - Abnormal; Notable for the following components:   RBC 3.97 (*)    Hemoglobin 12.6 (*)    MCV 100.5 (*)    RDW 16.5 (*)    Lymphs Abs 0.6 (*)    Abs Immature Granulocytes 0.09 (*)    All other components within normal limits  PROTIME-INR  APTT  HEPATIC FUNCTION PANEL    CT ABDOMEN PELVIS W CONTRAST  Final Result    IR Radiologist Eval & Mgmt    (Results Pending)    Medications  sodium chloride 0.9 % bolus 1,000 mL (has no administration in time range)  sodium chloride (PF) 0.9 % injection (has no administration in time range)  HYDROmorphone (DILAUDID) injection 0.5 mg (0.5 mg Intravenous Given 11/24/22 0433)  iohexol (OMNIPAQUE) 300 MG/ML solution 100 mL (100 mLs Intravenous Contrast Given 11/24/22 0356)     Procedures  /  Critical Care Procedures  ED Course and Medical Decision Making  Initial Impression and Ddx 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  The surrounding skin of the stoma appears quite irritated, scabbed, obviously has not been persistently damp from gastric drainage.  Patient commenting on occasional blood leaking from the stoma.  There is quite a bit of tenderness to the area.  Will obtain CT to  evaluate for underlying infection or other complication.  Other considerations include acute kidney injury or dehydration or electrolyte disturbance from poor p.o. intake due to this issue.  Patient also has an indwelling Foley catheter which was placed in the setting of urinary retention several days ago, he has been unable to get it removed by urology due to the holiday.  Past medical/surgical history that increases complexity of  ED encounter: Metastatic cancer  Interpretation of Diagnostics I personally reviewed the laboratory assessment and my interpretation is as follows: Hyponatremia  CT is without emergent findings  Patient Reassessment and Ultimate Disposition/Management     Discussed case with interventional radiology, who will come by to see the patient in consultation and provide recommendations.  Signed out to oncoming provider at shift change.  Patient management required discussion with the following services or consulting groups:  Radiology/Interventional Radiology  Complexity of Problems Addressed Acute illness or injury that poses threat of life of bodily function  Additional Data Reviewed and Analyzed Further history obtained from: Recent discharge summary and Recent Consult notes  Additional Factors Impacting ED Encounter Risk Use of parenteral controlled substances and Consideration of hospitalization  Barth Kirks. 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 Abdominal pain, unspecified abdominal location  R10.9     3. Dehydration  E86.0       ED Discharge Orders     None        Discharge Instructions Discussed with and Provided to Patient:   Discharge Instructions   None      Maudie Flakes, MD 11/24/22 [phone]

## 2022-11-24 NOTE — ED Provider Notes (Signed)
  Physical Exam  BP (!) 156/109   Pulse 98   Temp 98 F (36.7 C)   Resp 16   Ht 5\' 11"  (1.803 m)   Wt 50.8 kg   SpO2 97%   BMI 15.62 kg/m   Physical Exam  Procedures  Procedures  ED Course / MDM    Medical Decision Making Amount and/or Complexity of Data Reviewed Labs: ordered. Radiology: ordered.  Risk Prescription drug management.   Received care of patient from Mark Yu Please see his note for prior history, physical and care.  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 Plan for IR to evaluate.  IR evaluated and gave patient options for sequentially smaller tubes or allowing it to continue to heal over time.  He declines further tubes and would rather continue to dry the drainage, use topical zinc.  Gave rx for lidocaine jelly to use topically as well 4 times daily to help alleviate pain. He is completing a doxycycline rx.    Personally evaluated labs ordered by Mark Yu which do not show clinically significant electrolyte changes, AKI or anemia.  CT shows no other complications.  Additionally, he has foley catheter in place for one week with urinary retention last week. He attempted to call urology for follow up but was on hold. Discussed this with on call urology who will reach out to schedulers to call him.    Discussed that I do not see indication for admission at this time.  Will give the lidocaine rx (discussed not to overuse), protonix rx, provide leg bag for foley to allow improved mobility.       Mark Morgan, MD 11/25/22 [phone]

## 2022-11-24 NOTE — Discharge Instructions (Addendum)
It was a pleasure caring for you today! Hopefully with the leg bag you will feel improved mobility and the lidocaine and protonix will help with the pain around the prior feeding tube site.  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

## 2022-11-24 NOTE — ED Notes (Signed)
Attempted to reschedule pt's chemo tx. Got voicemail for both Dr Julien Nordmann and the nurse line. Left voicemail.

## 2022-11-25 ENCOUNTER — Other Ambulatory Visit: Payer: Medicare Other

## 2022-11-25 ENCOUNTER — Other Ambulatory Visit: Payer: Medicare Other | Admitting: *Deleted

## 2022-11-25 VITALS — BP 139/70 | HR 100 | Temp 98.1°F | Resp 19

## 2022-11-25 DIAGNOSIS — Z515 Encounter for palliative care: Secondary | ICD-10-CM

## 2022-11-25 NOTE — Progress Notes (Unsigned)
[facility] OFFICE PROGRESS NOTE  Jennette Dubin, NP [address] [place] [address]  DIAGNOSIS: Metastatic small cell lung cancer initially diagnosed as Limited stage (T1c, N1, M1a) small cell lung cancer presented with left upper lobe lung nodule as well as left hilar lymphadenopathy as well as right upper lobe lung nodule diagnosed in February 2023    PRIOR THERAPY: 1) Systemic chemotherapy with cisplatin 80 Mg/M2 on day 1 and 2 etoposide 100 Mg/M2 on days 1, 2 and 3 concurrent with radiotherapy.  First dose February 17, 2022.  Status post 4 cycles. Starting from cycle #2 his dose of cisplatin was reduced to 60 Mg/M2 on day 1 and etoposide reduced to 80 Mg/M2 on days 1, 2 and 3 every 3 weeks secondary to intolerance.  Last dose was giving July 14, 2022 discontinued after disease progression.   2) Zepzelca (lurbinectedin) 2.6 Mg/M2 every 3 weeks.  First dose September 07, 2022.  Status post 3 cycles.    CURRENT THERAPY: : Irinotecan on days 1 and 8 IV every 3 weeks.  First dose expected on 11/26/22.  INTERVAL HISTORY: Mark Yu 66 y.o. male returns to the clinic today for a follow up visit.  The patient was last seen on 11/10/2022.  Unfortunately, the patient was recently found to have evidence of disease progression.  The patient was given the option of referral to palliative care/hospice versus trying third line chemotherapy.  Understandably, it was hard for the patient to hear this information.  The counselor called him to check on him and when he is going to check on him. A discussion about his prognosis was discussed at his last appointment. The patient understands that even with treatment, his prognosis is poor, likely less than 3 months.  Today, he states he is going to have a positive attitude and wants to prove the statistics wrong. The patient was supposed to undergo cycle #1 of treatment last week, but he presented to the ER on 11/21 for  urinary retention. He had a foley catheter placed and followed up with urology in about 2 weeks. He also had his PEG tube removed on 11/16. He had some leakage of fluid after eating. He was evaluated by IR and [facility] on 11/21 while in the ER. They suspect that the leakage is related to the tract not being completely sealed. Nothing can be done to speed up the process and smaller frequent meals and rest in reclined position to minizie leakage was recommended. He was also treated with 10 days of doxycycline in the event that there was any cellulitis component.   He was seen in the emergency room again earlier this week.  He was given topical zinc, lidocaine jelly to help alleviate pain, and a refill of his doxycycline.  Since being out of the emergency room, the patient states that he is actually starting to feel better.  He has had less drainage from the tube site over the last few days.  It was helpful for him to be given a leg bag for mobility for his urinary catheter.  Today he denies any fever, chills, or night sweats.  The patient's been having some ongoing hoarseness in his voice.  He thinks his shortness of breath with exertion is stable.  Denies significant cough.  He denies hemoptysis or chest pain.  Denies any nausea, vomiting, diarrhea, or constipation.  Denies any headache or visual changes.  He is here today for evaluation repeat blood  work before undergoing day 1 cycle 1.   MEDICAL HISTORY: Past Medical History:  Diagnosis Date   Anxiety    tx xanax   COPD (chronic obstructive pulmonary disease) ([place])    tx inhalers   Depression    Dyspnea    with exertion   HTN (hypertension)    tx amlodipine   Hyponatremia 03/08/2022    ALLERGIES:  is allergic to amoxicillin and macrolides and ketolides.  MEDICATIONS:  Current Outpatient Medications  Medication Sig Dispense Refill   diazepam (VALIUM) 10 MG tablet 1 tablet as needed Orally twice a day for 30 days     traMADol (ULTRAM) 50 MG  tablet 1 tablet as needed Orally three times a day     acetaminophen (TYLENOL) 325 MG tablet 1 tablet as needed Orally every 4 hrs (Patient not taking: Reported on 11/10/2022)     albuterol (PROVENTIL) (2.5 MG/3ML) 0.083% nebulizer solution Take 2.5 mg by nebulization every 6 (six) hours as needed for wheezing or shortness of breath.     amLODipine (NORVASC) 5 MG tablet Take 5 mg by mouth daily.     BREZTRI AEROSPHERE 160-9-4.8 MCG/ACT AERO Inhale 2 puffs into the lungs in the morning and at bedtime.     dextromethorphan-guaiFENesin (ROBITUSSIN-DM) 10-100 MG/5ML liquid Take 10 mLs by mouth 2 (two) times daily.     doxycycline (VIBRA-TABS) 100 MG tablet Take 1 tablet (100 mg total) by mouth 2 (two) times daily. 20 tablet 1   fluticasone (FLONASE) 50 MCG/ACT nasal spray Place 2 sprays into both nostrils daily as needed for allergies or rhinitis.     lidocaine (XYLOCAINE) 2 % jelly Apply 1 Application topically 4 (four) times daily as needed. 30 mL 0   lidocaine-prilocaine (EMLA) cream Apply to the Port-A-Cath site 30-60 minutes before chemotherapy 30 g 0   loperamide (IMODIUM) 2 MG capsule Take 2 tabs by mouth with first loose stool, then 1 tab with each additional loose stool as needed. Do not exceed 8 tabs in a 24-hour period 30 capsule 1   Nutritional Supplements (FEEDING SUPPLEMENT, OSMOLITE 1.5 CAL,) LIQD Place 1,000 mLs into feeding tube continuous.  0   omeprazole (PRILOSEC OTC) 20 MG tablet Take 1 tablet (20 mg total) by mouth 2 (two) times daily. 60 tablet 3   pantoprazole (PROTONIX) 20 MG tablet Take 2 tablets (40 mg total) by mouth daily. 60 tablet 0   predniSONE (DELTASONE) 10 MG tablet TAKE TWO TABLETS BY MOUTH DAILY WITH BREAKFAST 180 tablet 0   prochlorperazine (COMPAZINE) 10 MG tablet Take 1 tablet (10 mg total) by mouth every 6 (six) hours as needed for nausea or vomiting. 30 tablet 1   sildenafil (REVATIO) 20 MG tablet 1 tablet Orally Once a day for 90 days (Patient not taking:  Reported on 11/10/2022)     Water For Irrigation, Sterile (FREE WATER) SOLN Place 150 mLs into feeding tube 6 (six) times daily. 30 mL 3   ZANAFLEX 4 MG tablet 1 tablet as needed Orally every 6 hours for 30 days     No current facility-administered medications for this visit.    SURGICAL HISTORY:  Past Surgical History:  Procedure Laterality Date   BRONCHIAL BIOPSY  02/02/2022   Procedure: BRONCHIAL BIOPSIES;  Surgeon: Collene Gobble, MD;  Location[facility] ENDOSCOPY;  Service: Pulmonary;;   BRONCHIAL BRUSHINGS  02/02/2022   Procedure: BRONCHIAL BRUSHINGS;  Surgeon: Collene Gobble, MD;  Location: [facility] ENDOSCOPY;  Service: Pulmonary;;   BRONCHIAL NEEDLE ASPIRATION BIOPSY  02/02/2022   Procedure: BRONCHIAL NEEDLE ASPIRATION BIOPSIES;  Surgeon: Collene Gobble, MD;  Location[facility] ENDOSCOPY;  Service: Pulmonary;;   IR GASTROSTOMY TUBE MOD SED  04/03/2022   IR GASTROSTOMY TUBE REMOVAL  11/04/2022   IR IMAGING GUIDED PORT INSERTION  02/24/2022   MRI     NO PAST SURGERIES     VIDEO BRONCHOSCOPY WITH ENDOBRONCHIAL ULTRASOUND N/A 02/02/2022   Procedure: VIDEO BRONCHOSCOPY WITH ENDOBRONCHIAL ULTRASOUND;  Surgeon: Collene Gobble, MD;  Location: [facility] ENDOSCOPY;  Service: Pulmonary;  Laterality: N/A;   VIDEO BRONCHOSCOPY WITH RADIAL ENDOBRONCHIAL ULTRASOUND  02/02/2022   Procedure: VIDEO BRONCHOSCOPY WITH RADIAL ENDOBRONCHIAL ULTRASOUND;  Surgeon: Collene Gobble, MD;  Location: [facility] ENDOSCOPY;  Service: Pulmonary;;    REVIEW OF SYSTEMS:   Review of Systems  Constitutional: Positive for fatigue. Negative for appetite change, chills, and fever.  HENT:  Negative for mouth sores, nosebleeds, sore throat and trouble swallowing.   Eyes: Negative for eye problems and icterus.  Respiratory: Positive for stable dyspnea on exertion. Negative for cough, hemoptysis,  and wheezing.   Cardiovascular: Negative for chest pain and leg swelling.  Gastrointestinal: Negative for abdominal pain, constipation, diarrhea, nausea and  vomiting.  Genitourinary: Positive for urinary catheter.  Negative for bladder incontinence, difficulty urinating, dysuria, frequency and hematuria.   Musculoskeletal: Negative for back pain, gait problem, neck pain and neck stiffness.  Skin: Negative for itching and rash.  Neurological: Negative for dizziness, extremity weakness, gait problem, headaches, light-headedness and seizures.  Hematological: Negative for adenopathy. Positive for significant bruising.  Psychiatric/Behavioral: Negative for confusion, depression and sleep disturbance. The patient is not nervous/anxious.       PHYSICAL EXAMINATION:  Blood pressure 123/79, pulse 100, temperature 97.7 F (36.5 C), temperature source Oral, resp. rate 15, weight 103 lb 6.4 oz (46.9 kg), SpO2 95 %.  ECOG PERFORMANCE STATUS: 2  Physical Exam  Constitutional: Oriented to person, place, and time and  thin appearing male. No distress.  HENT:  Head: Normocephalic and atraumatic.  Mouth/Throat: Oropharynx is clear and moist. No oropharyngeal exudate.  Eyes: Conjunctivae are normal. Right eye exhibits no discharge. Left eye exhibits no discharge. No scleral icterus.  Neck: Normal range of motion. Neck supple.  Cardiovascular: Normal rate, regular rhythm, normal heart sounds and intact distal pulses.   Pulmonary/Chest: Effort normal.  Quiet breath sounds in all lung fields. No respiratory distress. No wheezes. No rales.  Abdominal: Soft. Bowel sounds are normal. Exhibits no distension and no mass. There is no tenderness.  Musculoskeletal: Normal range of motion. Exhibits no edema.  Lymphadenopathy:    No cervical adenopathy.  Neurological: Alert and oriented to person, place, and time. Exhibits muscle wasting.  The patient was examined in the wheelchair.  Skin: Significant bruising on upper extremities. Skin is warm and dry. No rash noted. Not diaphoretic. No erythema. No pallor.  Psychiatric: Mood, memory and judgment normal.  Vitals  reviewed.  LABORATORY DATA: Lab Results  Component Value Date   WBC 9.7 11/26/2022   HGB 10.5 (L) 11/26/2022   HCT 32.2 (L) 11/26/2022   MCV 98.2 11/26/2022   PLT 224 11/26/2022      Chemistry      Component Value Date/Time   NA 138 11/26/2022 1110   K 3.3 (L) 11/26/2022 1110   CL 101 11/26/2022 1110   CO2 28 11/26/2022 1110   BUN 17 11/26/2022 1110   CREATININE 0.47 (L) 11/26/2022 1110      Component Value Date/Time   CALCIUM  9.5 11/26/2022 1110   ALKPHOS 111 11/26/2022 1110   AST 15 11/26/2022 1110   ALT 11 11/26/2022 1110   BILITOT 0.8 11/26/2022 1110       RADIOGRAPHIC STUDIES:  CT ABDOMEN PELVIS W CONTRAST  Result Date: 11/24/2022 CLINICAL DATA:  Postoperative abdominal pain, question intracutaneous fistula or complication. History of feeding tube removal 3 weeks ago with bleeding at site. EXAM: CT ABDOMEN AND PELVIS WITH CONTRAST TECHNIQUE: Multidetector CT imaging of the abdomen and pelvis was performed using the standard protocol following bolus administration of intravenous contrast. RADIATION DOSE REDUCTION: This exam was performed according to the departmental dose-optimization program which includes automated exposure control, adjustment of the mA and/or kV according to patient size and/or use of iterative reconstruction technique. CONTRAST:  151mL OMNIPAQUE IOHEXOL 300 MG/ML  SOLN COMPARISON:  Eighteen days ago FINDINGS: Lower chest: Emphysema in minimally covered scarring in the lower lobes. No acute finding Hepatobiliary: Scattered hepatic metastases as recently staged. The largest lesion in the left lobe liver measures 4.2 cm on today's postcontrast imaging. No superimposed acute finding.No evidence of biliary obstruction or stone. Pancreas: Unremarkable. Spleen: Unremarkable. Adrenals/Urinary Tract: Negative adrenals. No hydronephrosis or stone. The bladder is collapsed around a Foley catheter. Stomach/Bowel: Enhancing tract at site of prior percutaneous  gastrostomy tube. No collection, inflammation, or visible cause of bleeding. Negative for bowel obstruction. Distal colonic diverticulosis. Vascular/Lymphatic: No acute vascular abnormality. Confluent atheromatous calcification of the aorta and its branches. Extensive atheromatous narrowing of the iliacs and superficial femoral arteries. No mass or adenopathy. Reproductive:No pathologic findings. Other: No ascites or pneumoperitoneum. Musculoskeletal: Left inferior pubic ramus fracture extending towards ischial tuberosity with periosteal reaction. Generalized osteopenia. IMPRESSION: 1. Expected appearance of abandoned percutaneous gastrostomy tube site. No abscess or inflammation. 2. Healing left inferior pubic ramus fracture. 3. Known hepatic metastatic disease. Electronically Signed   By: Jorje Guild M.D.   On: 11/24/2022 04:39   CT Chest W Contrast  Result Date: 11/09/2022 CLINICAL DATA:  Small cell lung cancer staging; * Tracking Code: BO * EXAM: CT CHEST, ABDOMEN, AND PELVIS WITH CONTRAST TECHNIQUE: Multidetector CT imaging of the chest, abdomen and pelvis was performed following the standard protocol during bolus administration of intravenous contrast. RADIATION DOSE REDUCTION: This exam was performed according to the departmental dose-optimization program which includes automated exposure control, adjustment of the mA and/or kV according to patient size and/or use of iterative reconstruction technique. CONTRAST:  183mL OMNIPAQUE IOHEXOL 300 MG/ML  SOLN COMPARISON:  CT chest with contrast dated June 02, 2022; PET-CT dated January 20, 2022; MRI abdomen dated April 24, 2022 FINDINGS: CT CHEST FINDINGS Cardiovascular: Normal heart size. No pericardial effusion. Normal caliber thoracic aorta with severe atherosclerotic disease. No suspicious filling defects of the central pulmonary arteries. Moderate three-vessel coronary artery calcifications. Mediastinum/Nodes: Esophagus and thyroid are unremarkable. No  pathologically enlarged nodes seen in the chest. Lungs/Pleura: Central airways are patent. Moderate centrilobular emphysema. Stable mild linear opacity of the left upper lobe, compatible with treated disease. Solid nodule of the right upper lobe measuring 1.6 x 1.3 cm on series 4, image 56, unchanged in size when remeasured in similar plane. Similar adjacent linear opacities which are compatible with post radiation change. No pleural effusion or pneumothorax Musculoskeletal: Old bilateral rib fractures. No aggressive appearing osseous lesions. CT ABDOMEN PELVIS FINDINGS Hepatobiliary: Numerous irregular liver lesions, increased in size when compared with prior MRI. Reference lesion of the left hepatic lobe measures 3.5 x 2.7 cm on series 2, image 70, previously  measured up to 1.8 cm. Reference lesion of the right hepatic lobe measures 2.2 x 2.0 cm on series 2, image 79, previously measured up to 1.0 cm .gallbladder is unremarkable. No biliary ductal dilation. Pancreas: Unremarkable. No pancreatic ductal dilatation or surrounding inflammatory changes. Spleen: Normal in size without focal abnormality. Adrenals/Urinary Tract: Bilateral adrenal glands are unremarkable. No hydronephrosis. No nephrolithiasis. Mild bladder wall thickening. Stomach/Bowel: Stomach is within normal limits. Appendix appears normal. Diverticulosis. Mild wall thickening of the sigmoid colon, similar to prior PET-CT and likely sequela of chronic diverticulitis. No evidence of obstruction. Vascular/Lymphatic: Aortic atherosclerosis. No enlarged abdominal or pelvic lymph nodes. Reproductive: Prostatomegaly Other: No abdominal wall hernia or abnormality. No abdominopelvic ascites. Musculoskeletal: No acute or significant osseous findings. IMPRESSION: 1. Stable solid nodule of the right upper lobe with adjacent postradiation change and stable posttreatment changes of the left upper lobe. No evidence of progressive disease in the chest. 2. Hepatic  metastatic lesions are increased in size when compared with prior abdominal MRI. 3. Prostatomegaly with mild bladder wall thickening, findings can be seen in the setting of chronic bladder outlet obstruction. 4. Mild wall thickening of the sigmoid colon possibly related to diverticular disease and similar to prior PET-CT. Recommend colonoscopy if patient is not current for screening. 5. Aortic Atherosclerosis (ICD10-I70.0) and Emphysema (ICD10-J43.9). Electronically Signed   By: Yetta Glassman M.D.   On: 11/09/2022 16:42   CT Abdomen Pelvis W Contrast  Result Date: 11/09/2022 CLINICAL DATA:  Small cell lung cancer staging; * Tracking Code: BO * EXAM: CT CHEST, ABDOMEN, AND PELVIS WITH CONTRAST TECHNIQUE: Multidetector CT imaging of the chest, abdomen and pelvis was performed following the standard protocol during bolus administration of intravenous contrast. RADIATION DOSE REDUCTION: This exam was performed according to the departmental dose-optimization program which includes automated exposure control, adjustment of the mA and/or kV according to patient size and/or use of iterative reconstruction technique. CONTRAST:  131mL OMNIPAQUE IOHEXOL 300 MG/ML  SOLN COMPARISON:  CT chest with contrast dated June 02, 2022; PET-CT dated January 20, 2022; MRI abdomen dated April 24, 2022 FINDINGS: CT CHEST FINDINGS Cardiovascular: Normal heart size. No pericardial effusion. Normal caliber thoracic aorta with severe atherosclerotic disease. No suspicious filling defects of the central pulmonary arteries. Moderate three-vessel coronary artery calcifications. Mediastinum/Nodes: Esophagus and thyroid are unremarkable. No pathologically enlarged nodes seen in the chest. Lungs/Pleura: Central airways are patent. Moderate centrilobular emphysema. Stable mild linear opacity of the left upper lobe, compatible with treated disease. Solid nodule of the right upper lobe measuring 1.6 x 1.3 cm on series 4, image 56, unchanged in  size when remeasured in similar plane. Similar adjacent linear opacities which are compatible with post radiation change. No pleural effusion or pneumothorax Musculoskeletal: Old bilateral rib fractures. No aggressive appearing osseous lesions. CT ABDOMEN PELVIS FINDINGS Hepatobiliary: Numerous irregular liver lesions, increased in size when compared with prior MRI. Reference lesion of the left hepatic lobe measures 3.5 x 2.7 cm on series 2, image 70, previously measured up to 1.8 cm. Reference lesion of the right hepatic lobe measures 2.2 x 2.0 cm on series 2, image 79, previously measured up to 1.0 cm .gallbladder is unremarkable. No biliary ductal dilation. Pancreas: Unremarkable. No pancreatic ductal dilatation or surrounding inflammatory changes. Spleen: Normal in size without focal abnormality. Adrenals/Urinary Tract: Bilateral adrenal glands are unremarkable. No hydronephrosis. No nephrolithiasis. Mild bladder wall thickening. Stomach/Bowel: Stomach is within normal limits. Appendix appears normal. Diverticulosis. Mild wall thickening of the sigmoid colon, similar to prior  PET-CT and likely sequela of chronic diverticulitis. No evidence of obstruction. Vascular/Lymphatic: Aortic atherosclerosis. No enlarged abdominal or pelvic lymph nodes. Reproductive: Prostatomegaly Other: No abdominal wall hernia or abnormality. No abdominopelvic ascites. Musculoskeletal: No acute or significant osseous findings. IMPRESSION: 1. Stable solid nodule of the right upper lobe with adjacent postradiation change and stable posttreatment changes of the left upper lobe. No evidence of progressive disease in the chest. 2. Hepatic metastatic lesions are increased in size when compared with prior abdominal MRI. 3. Prostatomegaly with mild bladder wall thickening, findings can be seen in the setting of chronic bladder outlet obstruction. 4. Mild wall thickening of the sigmoid colon possibly related to diverticular disease and similar  to prior PET-CT. Recommend colonoscopy if patient is not current for screening. 5. Aortic Atherosclerosis (ICD10-I70.0) and Emphysema (ICD10-J43.9). Electronically Signed   By: Yetta Glassman M.D.   On: 11/09/2022 16:42   IR GASTROSTOMY TUBE REMOVAL  Result Date: 11/04/2022 INDICATION: Patient history of small-cell lung cancer currently receiving chemoradiation and trach placement. Patient tried has been removed and he is eating and drinking normally. Request is for gastrostomy tube removal as it is no longer needed. EXAM: BEDSIDE REMOVAL OF GASTROSTOMY TUBE COMPARISON:  None Available. MEDICATIONS: Viscous lidocaine 10 mL CONTRAST:  None FLUOROSCOPY TIME:  None COMPLICATIONS: None immediate. PROCEDURE: Informed written consent was obtained from the patient and the patient's wife following explanation of the procedure, risks, benefits and alternatives. A time-out was performed prior to the initiation of the procedure. The track of the existing gastrostomy tube was lubricated with viscous lidocaine. Using manual traction, the existing pull-through gastrostomy tube was removed intact. Pressure was held at the site until hemostasis was achieved. A dressing was placed. The patient tolerated the procedure well without immediate postprocedural complication. IMPRESSION: Successful bedside removal of pull-through gastrostomy tube without complication. Read by: Narda Rutherford, AGNP-BC Electronically Signed   By: Ruthann Cancer M.D.   On: 11/04/2022 12:04     ASSESSMENT/PLAN:  This is a very pleasant 66 year old Caucasian male diagnosed with small cell lung cancer.  He was diagnosed in February 2023.  He presented with likely extensive stage disease (T1c, N1, M1 a).  He presented with bilateral nodules in the right upper lobe as well as a left upper lobe but this also could be treated with limited stage disease because of the proximity of the lesions.   The patient initially underwent cystoscopy chemotherapy with  cisplatin 80 mg per metered squared on day 1 and etoposide 100 mg per metered squared on days 1, 2, and 3 IV every 3 weeks.  He status post 4 cycles of treatment.  He was admitted after cycle #1 for respiratory failure and multifocal pneumonia and radiation pneumonitis.  During from cycle 2 the patient's dose of cisplatin was reduced to 60 mg per metered squared on day 1 and etoposide 80 mg per metered squared.   He had restaging scans that showed improvement in the lung lesions but there was concerning hypodensity lesion in the left hepatic lobe concerning for metastasis.   Unfortunately, the patient had a relapse of disease shortly after the induction systemic chemotherapy.  The patient understands this is a poor prognostic factor.   The patient then underwent second line chemotherapy with stem cell, 2.6 mg per metered squared every 3 weeks.  First dose was on 09/08/2022.  He is status post 3 cycles.   At the patient's last appointment, the patient's restaging CT scan showed disease progression in the liver.  Dr. Julien Nordmann had a lengthy discussion with the patient about his current condition.  Dr. Julien Nordmann discussed he has a serious condition and it is incurable.  He was given the option of hospice versus trying third line chemotherapy.  The patient opted to try third line chemotherapy  The patient is currently undergoing chemotherapy with Irinotecan days 1 and 8 IV every 3 weeks. He is scheduled for cycle #1 today.   Labs were reviewed.  Recommend that he proceed with day 1 cycle 1 today scheduled.  We will see him back for follow-up visit in 3 weeks for evaluation and repeat blood work before undergoing day 1 cycle 2 and to manage any adverse side effects of treatment.   Discussed that the most common adverse side effects is diarrhea.  He was instructed to take Imodium.  It looks like Dr. Julien Nordmann sent this to his pharmacy.   Overall, the patient understands that he has a poor prognosis. I referred  the patient to the counselor at his last appointment.  Patient has a positive attitude today.  He will follow-up with urology as scheduled.  He will continue with the wound care recommendations as outlined by the wound care nurse and the emergency room and interventional radiology.  The patient was advised to call immediately if she has any concerning symptoms in the interval. The patient voices understanding of current disease status and treatment options and is in agreement with the current care plan. All questions were answered. The patient knows to call the clinic with any problems, questions or concerns. We can certainly see the patient much sooner if necessary   No orders of the defined types were placed in this encounter.    The total time spent in the appointment was 20-29 minutes.   Ehtan Delfavero L Urvi Imes, PA-C 11/26/22

## 2022-11-26 ENCOUNTER — Other Ambulatory Visit: Payer: Self-pay

## 2022-11-26 ENCOUNTER — Inpatient Hospital Stay: Payer: Medicare Other

## 2022-11-26 ENCOUNTER — Inpatient Hospital Stay (HOSPITAL_BASED_OUTPATIENT_CLINIC_OR_DEPARTMENT_OTHER): Payer: Medicare Other | Admitting: Physician Assistant

## 2022-11-26 VITALS — BP 123/79 | HR 100 | Temp 97.7°F | Resp 15 | Wt 103.4 lb

## 2022-11-26 DIAGNOSIS — C3412 Malignant neoplasm of upper lobe, left bronchus or lung: Secondary | ICD-10-CM

## 2022-11-26 DIAGNOSIS — Z5111 Encounter for antineoplastic chemotherapy: Secondary | ICD-10-CM | POA: Diagnosis not present

## 2022-11-26 DIAGNOSIS — Z95828 Presence of other vascular implants and grafts: Secondary | ICD-10-CM

## 2022-11-26 LAB — CMP (CANCER CENTER ONLY)
ALT: 11 U/L (ref 0–44)
AST: 15 U/L (ref 15–41)
Albumin: 4.2 g/dL (ref 3.5–5.0)
Alkaline Phosphatase: 111 U/L (ref 38–126)
Anion gap: 9 (ref 5–15)
BUN: 17 mg/dL (ref 8–23)
CO2: 28 mmol/L (ref 22–32)
Calcium: 9.5 mg/dL (ref 8.9–10.3)
Chloride: 101 mmol/L (ref 98–111)
Creatinine: 0.47 mg/dL — ABNORMAL LOW (ref 0.61–1.24)
GFR, Estimated: >60 mL/min (ref >60)
Glucose, Bld: 109 mg/dL — ABNORMAL HIGH (ref 70–99)
Potassium: 3.3 mmol/L — ABNORMAL LOW (ref 3.5–5.1)
Sodium: 138 mmol/L (ref 135–145)
Total Bilirubin: 0.8 mg/dL (ref 0.3–1.2)
Total Protein: 6.7 g/dL (ref 6.5–8.1)

## 2022-11-26 LAB — CBC WITH DIFFERENTIAL (CANCER CENTER ONLY)
Abs Immature Granulocytes: 0.05 10*3/uL (ref 0.00–0.07)
Basophils Absolute: 0 10*3/uL (ref 0.0–0.1)
Basophils Relative: 0 %
Eosinophils Absolute: 0 10*3/uL (ref 0.0–0.5)
Eosinophils Relative: 0 %
HCT: 32.2 % — ABNORMAL LOW (ref 39.0–52.0)
Hemoglobin: 10.5 g/dL — ABNORMAL LOW (ref 13.0–17.0)
Immature Granulocytes: 1 %
Lymphocytes Relative: 2 %
Lymphs Abs: 0.2 10*3/uL — ABNORMAL LOW (ref 0.7–4.0)
MCH: 32 pg (ref 26.0–34.0)
MCHC: 32.6 g/dL (ref 30.0–36.0)
MCV: 98.2 fL (ref 80.0–100.0)
Monocytes Absolute: 0.4 10*3/uL (ref 0.1–1.0)
Monocytes Relative: 4 %
Neutro Abs: 9 10*3/uL — ABNORMAL HIGH (ref 1.7–7.7)
Neutrophils Relative %: 93 %
Platelet Count: 224 10*3/uL (ref 150–400)
RBC: 3.28 MIL/uL — ABNORMAL LOW (ref 4.22–5.81)
RDW: 15.9 % — ABNORMAL HIGH (ref 11.5–15.5)
WBC Count: 9.7 10*3/uL (ref 4.0–10.5)
nRBC: 0 % (ref 0.0–0.2)

## 2022-11-26 MED ORDER — ATROPINE SULFATE 1 MG/ML IV SOLN
0.5000 mg | Freq: Once | INTRAVENOUS | Status: AC | PRN
  Administered 2022-11-26: 0.5 mg via INTRAVENOUS
  Filled 2022-11-26: qty 1

## 2022-11-26 MED ORDER — SODIUM CHLORIDE 0.9% FLUSH
10.0000 mL | Freq: Once | INTRAVENOUS | Status: AC
  Administered 2022-11-26: 10 mL

## 2022-11-26 MED ORDER — SODIUM CHLORIDE 0.9% FLUSH: 10.0000 mL | INTRAVENOUS | Status: DC | PRN

## 2022-11-26 MED ORDER — SODIUM CHLORIDE 0.9 % IV SOLN
65.0000 mg/m2 | Freq: Once | INTRAVENOUS | Status: AC
  Administered 2022-11-26: 100 mg via INTRAVENOUS
  Filled 2022-11-26: qty 5

## 2022-11-26 MED ORDER — PALONOSETRON HCL INJECTION 0.25 MG/5ML
0.2500 mg | Freq: Once | INTRAVENOUS | Status: AC
  Administered 2022-11-26: 0.25 mg via INTRAVENOUS
  Filled 2022-11-26: qty 5

## 2022-11-26 MED ORDER — SODIUM CHLORIDE 0.9 % IV SOLN: Freq: Once | INTRAVENOUS | Status: AC

## 2022-11-26 MED ORDER — SODIUM CHLORIDE 0.9 % IV SOLN
10.0000 mg | Freq: Once | INTRAVENOUS | Status: AC
  Administered 2022-11-26: 10 mg via INTRAVENOUS
  Filled 2022-11-26: qty 10

## 2022-11-26 MED ORDER — HEPARIN SOD (PORK) LOCK FLUSH 100 UNIT/ML IV SOLN: 500.0000 [IU] | Freq: Once | INTRAVENOUS | Status: DC | PRN

## 2022-11-26 NOTE — Patient Instructions (Signed)
[facility]COL[facility]  Discharge Instructions: Thank you for choosing [facility] to provide your oncology and hematology care.   If you have a lab appointment with the [facility], please go directly to the [facility] and check in at the registration area.   Wear comfortable clothing and clothing appropriate for easy access to any Portacath or PICC line.   We strive to give you quality time with your provider. You may need to reschedule your appointment if you arrive late (15 or more minutes).  Arriving late affects you and other patients whose appointments are after yours.  Also, if you miss three or more appointments without notifying the office, you may be dismissed from the clinic at the provider's discretion.      For prescription refill requests, have your pharmacy contact our office and allow 72 hours for refills to be completed.    Today you received the following chemotherapy and/or immunotherapy agents Irinotecan      To help prevent nausea and vomiting after your treatment, we encourage you to take your nausea medication as directed.  BELOW ARE SYMPTOMS THAT SHOULD BE REPORTED IMMEDIATELY: *FEVER GREATER THAN 100.4 F (38 C) OR HIGHER *CHILLS OR SWEATING *NAUSEA AND VOMITING THAT IS NOT CONTROLLED WITH YOUR NAUSEA MEDICATION *UNUSUAL SHORTNESS OF BREATH *UNUSUAL BRUISING OR BLEEDING *URINARY PROBLEMS (pain or burning when urinating, or frequent urination) *BOWEL PROBLEMS (unusual diarrhea, constipation, pain near the anus) TENDERNESS IN MOUTH AND THROAT WITH OR WITHOUT PRESENCE OF ULCERS (sore throat, sores in mouth, or a toothache) UNUSUAL RASH, SWELLING OR PAIN  UNUSUAL VAGINAL DISCHARGE OR ITCHING   Items with * indicate a potential emergency and should be followed up as soon as possible or go to the Emergency Department if any problems should occur.  Please show the CHEMOTHERAPY ALERT CARD or IMMUNOTHERAPY ALERT CARD at check-in to  the Emergency Department and triage nurse.  Should you have questions after your visit or need to cancel or reschedule your appointment, please contact [facility]  Dept: [phone]  and follow the prompts.  Office hours are 8:00 a.m. to 4:30 p.m. Monday - Friday. Please note that voicemails left after 4:00 p.m. may not be returned until the following business day.  We are closed weekends and major holidays. You have access to a nurse at all times for urgent questions. Please call the main number to the clinic Dept: [phone] and follow the prompts.   For any non-urgent questions, you may also contact your provider using MyChart. We now offer e-Visits for anyone 50 and older to request care online for non-urgent symptoms. For details visit [email or web address].   Also download the MyChart app! Go to the app store, search "MyChart", open the app, select [facility], and log in with your MyChart username and password.  Masks are optional in the cancer centers. If you would like for your care team to wear a mask while they are taking care of you, please let them know. You may have one support person who is at least 65 years old accompany you for your appointments.  Irinotecan Injection What is this medication? IRINOTECAN (ir in oh TEE kan) treats some types of cancer. It works by slowing down the growth of cancer cells. This medicine may be used for other purposes; ask your health care provider or pharmacist if you have questions. COMMON BRAND NAME(S): Camptosar What should I tell my care team before I take this  medication? They need to know if you have any of these conditions: Dehydration Diarrhea Infection, especially a viral infection, such as chickenpox, cold sores, herpes Liver disease Low blood cell levels (white cells, red cells, and platelets) Low levels of electrolytes, such as calcium, magnesium, or potassium in your blood Recent or ongoing  radiation An unusual or allergic reaction to irinotecan, other medications, foods, dyes, or preservatives If you or your partner are pregnant or trying to get pregnant Breast-feeding How should I use this medication? This medication is injected into a vein. It is given by your care team in a hospital or clinic setting. Talk to your care team about the use of this medication in children. Special care may be needed. Overdosage: If you think you have taken too much of this medicine contact a poison control center or emergency room at once. NOTE: This medicine is only for you. Do not share this medicine with others. What if I miss a dose? Keep appointments for follow-up doses. It is important not to miss your dose. Call your care team if you are unable to keep an appointment. What may interact with this medication? Do not take this medication with any of the following: Cobicistat Itraconazole This medication may also interact with the following: Certain antibiotics, such as clarithromycin, rifampin, rifabutin Certain antivirals for HIV or AIDS Certain medications for fungal infections, such as ketoconazole, posaconazole, voriconazole Certain medications for seizures, such as carbamazepine, phenobarbital, phenytoin Gemfibrozil Nefazodone St. John's wort This list may not describe all possible interactions. Give your health care provider a list of all the medicines, herbs, non-prescription drugs, or dietary supplements you use. Also tell them if you smoke, drink alcohol, or use illegal drugs. Some items may interact with your medicine. What should I watch for while using this medication? Your condition will be monitored carefully while you are receiving this medication. You may need blood work while taking this medication. This medication may make you feel generally unwell. This is not uncommon as chemotherapy can affect healthy cells as well as cancer cells. Report any side effects. Continue  your course of treatment even though you feel ill unless your care team tells you to stop. This medication can cause serious side effects. To reduce the risk, your care team may give you other medications to take before receiving this one. Be sure to follow the directions from your care team. This medication may affect your coordination, reaction time, or judgement. Do not drive or operate machinery until you know how this medication affects you. Sit up or stand slowly to reduce the risk of dizzy or fainting spells. Drinking alcohol with this medication can increase the risk of these side effects. This medication may increase your risk of getting an infection. Call your care team for advice if you get a fever, chills, sore throat, or other symptoms of a cold or flu. Do not treat yourself. Try to avoid being around people who are sick. Avoid taking medications that contain aspirin, acetaminophen, ibuprofen, naproxen, or ketoprofen unless instructed by your care team. These medications may hide a fever. This medication may increase your risk to bruise or bleed. Call your care team if you notice any unusual bleeding. Be careful brushing or flossing your teeth or using a toothpick because you may get an infection or bleed more easily. If you have any dental work done, tell your dentist you are receiving this medication. Talk to your care team if you or your partner are pregnant or  think either of you might be pregnant. This medication can cause serious birth defects if taken during pregnancy and for 6 months after the last dose. You will need a negative pregnancy test before starting this medication. Contraception is recommended while taking this medication and for 6 months after the last dose. Your care team can help you find the option that works for you. Do not father a child while taking this medication and for 3 months after the last dose. Use a condom for contraception during this time period. Do not  breastfeed while taking this medication and for 7 days after the last dose. This medication may cause infertility. Talk to your care team if you are concerned about your fertility. What side effects may I notice from receiving this medication? Side effects that you should report to your care team as soon as possible: Allergic reactions--skin rash, itching, hives, swelling of the face, lips, tongue, or throat Dry cough, shortness of breath or trouble breathing Increased saliva or tears, increased sweating, stomach cramping, diarrhea, small pupils, unusual weakness or fatigue, slow heartbeat Infection--fever, chills, cough, sore throat, wounds that don't heal, pain or trouble when passing urine, general feeling of discomfort or being unwell Kidney injury--decrease in the amount of urine, swelling of the ankles, hands, or feet Low red blood cell level--unusual weakness or fatigue, dizziness, headache, trouble breathing Severe or prolonged diarrhea Unusual bruising or bleeding Side effects that usually do not require medical attention (report to your care team if they continue or are bothersome): Constipation Diarrhea Hair loss Loss of appetite Nausea Stomach pain This list may not describe all possible side effects. Call your doctor for medical advice about side effects. You may report side effects to FDA at 1-800-FDA-1088. Where should I keep my medication? This medication is given in a hospital or clinic. It will not be stored at home. NOTE: This sheet is a summary. It may not cover all possible information. If you have questions about this medicine, talk to your doctor, pharmacist, or health care provider.  2023 Elsevier/Gold Standard (2022-04-23 00:00:00)

## 2022-11-30 ENCOUNTER — Telehealth: Payer: Self-pay

## 2022-11-30 NOTE — Telephone Encounter (Signed)
Mark Yu, counseling intern, called patient to check in on how they are doing. Patient reported they have gone to the ER twice since our last correspondence. The patient reported they are having a hard time keeping their oxygen levels up.   The patient reported they were going to try to go to work for a couple of hours.  The patient reported they are determined to show the doctors wrong on their estimation of his life.   The counselor told the patient they would call them next week to check in on how they are feeling.   Mark Yu,  Counseling Intern  [phone] [email or web address]

## 2022-12-01 ENCOUNTER — Other Ambulatory Visit: Payer: Medicare Other

## 2022-12-01 ENCOUNTER — Ambulatory Visit: Payer: Medicare Other

## 2022-12-01 ENCOUNTER — Other Ambulatory Visit: Payer: Self-pay

## 2022-12-01 ENCOUNTER — Ambulatory Visit: Payer: Medicare Other | Admitting: Oncology

## 2022-12-01 ENCOUNTER — Inpatient Hospital Stay (HOSPITAL_COMMUNITY)
Admission: EM | Admit: 2022-12-01 | Discharge: 2022-12-28 | DRG: 208 | Disposition: E | Payer: Medicare Other | Source: Ambulatory Visit | Attending: Pulmonary Disease | Admitting: Pulmonary Disease

## 2022-12-01 ENCOUNTER — Ambulatory Visit: Payer: Medicare Other | Admitting: Internal Medicine

## 2022-12-01 ENCOUNTER — Emergency Department (HOSPITAL_COMMUNITY): Payer: Medicare Other

## 2022-12-01 ENCOUNTER — Encounter (HOSPITAL_COMMUNITY): Payer: Self-pay

## 2022-12-01 ENCOUNTER — Ambulatory Visit: Payer: Medicare Other | Admitting: Physician Assistant

## 2022-12-01 ENCOUNTER — Ambulatory Visit (HOSPITAL_COMMUNITY)
Admission: RE | Admit: 2022-12-01 | Discharge: 2022-12-01 | Disposition: A | Discharge status: Home / Self Care | Payer: Medicare Other | Source: Ambulatory Visit | Attending: Radiology | Admitting: Radiology

## 2022-12-01 DIAGNOSIS — R338 Other retention of urine: Secondary | ICD-10-CM | POA: Diagnosis not present

## 2022-12-01 DIAGNOSIS — Z681 Body mass index (BMI) 19 or less, adult: Secondary | ICD-10-CM

## 2022-12-01 DIAGNOSIS — C3412 Malignant neoplasm of upper lobe, left bronchus or lung: Secondary | ICD-10-CM | POA: Insufficient documentation

## 2022-12-01 DIAGNOSIS — S22078A Other fracture of T9-T10 vertebra, initial encounter for closed fracture: Secondary | ICD-10-CM

## 2022-12-01 DIAGNOSIS — J441 Chronic obstructive pulmonary disease with (acute) exacerbation: Secondary | ICD-10-CM | POA: Diagnosis not present

## 2022-12-01 DIAGNOSIS — J439 Emphysema, unspecified: Secondary | ICD-10-CM | POA: Diagnosis present

## 2022-12-01 DIAGNOSIS — Z79899 Other long term (current) drug therapy: Secondary | ICD-10-CM

## 2022-12-01 DIAGNOSIS — J9622 Acute and chronic respiratory failure with hypercapnia: Secondary | ICD-10-CM | POA: Diagnosis present

## 2022-12-01 DIAGNOSIS — R296 Repeated falls: Secondary | ICD-10-CM | POA: Diagnosis present

## 2022-12-01 DIAGNOSIS — J189 Pneumonia, unspecified organism: Principal | ICD-10-CM | POA: Diagnosis present

## 2022-12-01 DIAGNOSIS — Z931 Gastrostomy status: Secondary | ICD-10-CM

## 2022-12-01 DIAGNOSIS — Z7952 Long term (current) use of systemic steroids: Secondary | ICD-10-CM

## 2022-12-01 DIAGNOSIS — I10 Essential (primary) hypertension: Secondary | ICD-10-CM | POA: Diagnosis present

## 2022-12-01 DIAGNOSIS — Z7189 Other specified counseling: Secondary | ICD-10-CM

## 2022-12-01 DIAGNOSIS — Z66 Do not resuscitate: Secondary | ICD-10-CM

## 2022-12-01 DIAGNOSIS — Z978 Presence of other specified devices: Secondary | ICD-10-CM

## 2022-12-01 DIAGNOSIS — E876 Hypokalemia: Secondary | ICD-10-CM | POA: Diagnosis not present

## 2022-12-01 DIAGNOSIS — C787 Secondary malignant neoplasm of liver and intrahepatic bile duct: Secondary | ICD-10-CM | POA: Diagnosis present

## 2022-12-01 DIAGNOSIS — J9621 Acute and chronic respiratory failure with hypoxia: Secondary | ICD-10-CM | POA: Diagnosis present

## 2022-12-01 DIAGNOSIS — F32A Depression, unspecified: Secondary | ICD-10-CM | POA: Diagnosis present

## 2022-12-01 DIAGNOSIS — E43 Unspecified severe protein-calorie malnutrition: Secondary | ICD-10-CM | POA: Diagnosis present

## 2022-12-01 DIAGNOSIS — I2510 Atherosclerotic heart disease of native coronary artery without angina pectoris: Secondary | ICD-10-CM | POA: Diagnosis present

## 2022-12-01 DIAGNOSIS — Z881 Allergy status to other antibiotic agents status: Secondary | ICD-10-CM

## 2022-12-01 DIAGNOSIS — Z87891 Personal history of nicotine dependence: Secondary | ICD-10-CM

## 2022-12-01 DIAGNOSIS — J188 Other pneumonia, unspecified organism: Secondary | ICD-10-CM | POA: Diagnosis present

## 2022-12-01 DIAGNOSIS — R630 Anorexia: Secondary | ICD-10-CM

## 2022-12-01 DIAGNOSIS — S22079A Unspecified fracture of T9-T10 vertebra, initial encounter for closed fracture: Secondary | ICD-10-CM

## 2022-12-01 DIAGNOSIS — K9422 Gastrostomy infection: Secondary | ICD-10-CM | POA: Diagnosis present

## 2022-12-01 DIAGNOSIS — J440 Chronic obstructive pulmonary disease with acute lower respiratory infection: Secondary | ICD-10-CM | POA: Diagnosis present

## 2022-12-01 DIAGNOSIS — J9601 Acute respiratory failure with hypoxia: Secondary | ICD-10-CM | POA: Diagnosis not present

## 2022-12-01 DIAGNOSIS — R4589 Other symptoms and signs involving emotional state: Secondary | ICD-10-CM

## 2022-12-01 DIAGNOSIS — J449 Chronic obstructive pulmonary disease, unspecified: Secondary | ICD-10-CM | POA: Diagnosis present

## 2022-12-01 DIAGNOSIS — Z7951 Long term (current) use of inhaled steroids: Secondary | ICD-10-CM

## 2022-12-01 DIAGNOSIS — Z515 Encounter for palliative care: Secondary | ICD-10-CM

## 2022-12-01 DIAGNOSIS — L0390 Cellulitis, unspecified: Secondary | ICD-10-CM

## 2022-12-01 DIAGNOSIS — Z888 Allergy status to other drugs, medicaments and biological substances status: Secondary | ICD-10-CM

## 2022-12-01 DIAGNOSIS — R54 Age-related physical debility: Secondary | ICD-10-CM | POA: Diagnosis present

## 2022-12-01 DIAGNOSIS — E8729 Other acidosis: Secondary | ICD-10-CM | POA: Diagnosis not present

## 2022-12-01 DIAGNOSIS — I480 Paroxysmal atrial fibrillation: Secondary | ICD-10-CM | POA: Diagnosis present

## 2022-12-01 DIAGNOSIS — L03818 Cellulitis of other sites: Secondary | ICD-10-CM | POA: Diagnosis present

## 2022-12-01 DIAGNOSIS — I952 Hypotension due to drugs: Secondary | ICD-10-CM | POA: Diagnosis not present

## 2022-12-01 DIAGNOSIS — R64 Cachexia: Secondary | ICD-10-CM | POA: Diagnosis present

## 2022-12-01 DIAGNOSIS — T4275XA Adverse effect of unspecified antiepileptic and sedative-hypnotic drugs, initial encounter: Secondary | ICD-10-CM | POA: Diagnosis not present

## 2022-12-01 DIAGNOSIS — F419 Anxiety disorder, unspecified: Secondary | ICD-10-CM | POA: Diagnosis present

## 2022-12-01 DIAGNOSIS — G9341 Metabolic encephalopathy: Secondary | ICD-10-CM | POA: Diagnosis not present

## 2022-12-01 DIAGNOSIS — R339 Retention of urine, unspecified: Secondary | ICD-10-CM | POA: Diagnosis present

## 2022-12-01 DIAGNOSIS — Z1152 Encounter for screening for COVID-19: Secondary | ICD-10-CM

## 2022-12-01 HISTORY — PX: IR PATIENT EVAL TECH 0-60 MINS: IMG5564

## 2022-12-01 LAB — CBC WITH DIFFERENTIAL/PLATELET
Abs Immature Granulocytes: 0.18 10*3/uL — ABNORMAL HIGH (ref 0.00–0.07)
Basophils Absolute: 0 10*3/uL (ref 0.0–0.1)
Basophils Relative: 0 %
Eosinophils Absolute: 0 10*3/uL (ref 0.0–0.5)
Eosinophils Relative: 0 %
HCT: 35.6 % — ABNORMAL LOW (ref 39.0–52.0)
Hemoglobin: 11.2 g/dL — ABNORMAL LOW (ref 13.0–17.0)
Immature Granulocytes: 1 %
Lymphocytes Relative: 0 %
Lymphs Abs: 0.1 10*3/uL — ABNORMAL LOW (ref 0.7–4.0)
MCH: 31.5 pg (ref 26.0–34.0)
MCHC: 31.5 g/dL (ref 30.0–36.0)
MCV: 100.3 fL — ABNORMAL HIGH (ref 80.0–100.0)
Monocytes Absolute: 0.5 10*3/uL (ref 0.1–1.0)
Monocytes Relative: 4 %
Neutro Abs: 12.8 10*3/uL — ABNORMAL HIGH (ref 1.7–7.7)
Neutrophils Relative %: 95 %
Platelets: 211 10*3/uL (ref 150–400)
RBC: 3.55 MIL/uL — ABNORMAL LOW (ref 4.22–5.81)
RDW: 16.4 % — ABNORMAL HIGH (ref 11.5–15.5)
WBC: 13.6 10*3/uL — ABNORMAL HIGH (ref 4.0–10.5)
nRBC: 0 % (ref 0.0–0.2)

## 2022-12-01 LAB — COMPREHENSIVE METABOLIC PANEL
ALT: 17 U/L (ref 0–44)
AST: 20 U/L (ref 15–41)
Albumin: 4 g/dL (ref 3.5–5.0)
Alkaline Phosphatase: 109 U/L (ref 38–126)
Anion gap: 10 (ref 5–15)
BUN: 16 mg/dL (ref 8–23)
CO2: 29 mmol/L (ref 22–32)
Calcium: 9.2 mg/dL (ref 8.9–10.3)
Chloride: 99 mmol/L (ref 98–111)
Creatinine, Ser: 0.69 mg/dL (ref 0.61–1.24)
GFR, Estimated: >60 mL/min (ref >60)
Glucose, Bld: 112 mg/dL — ABNORMAL HIGH (ref 70–99)
Potassium: 3.7 mmol/L (ref 3.5–5.1)
Sodium: 138 mmol/L (ref 135–145)
Total Bilirubin: 0.8 mg/dL (ref 0.3–1.2)
Total Protein: 7 g/dL (ref 6.5–8.1)

## 2022-12-01 LAB — URINALYSIS, ROUTINE W REFLEX MICROSCOPIC
Bilirubin Urine: NEGATIVE
Glucose, UA: NEGATIVE mg/dL
Ketones, ur: 5 mg/dL — AB
Leukocytes,Ua: NEGATIVE
Nitrite: NEGATIVE
Protein, ur: 30 mg/dL — AB
RBC / HPF: >50 RBC/hpf — ABNORMAL HIGH (ref 0–5)
Specific Gravity, Urine: >1.046 — ABNORMAL HIGH (ref 1.005–1.030)
pH: 7 (ref 5.0–8.0)

## 2022-12-01 LAB — CBG MONITORING, ED: Glucose-Capillary: 124 mg/dL — ABNORMAL HIGH (ref 70–99)

## 2022-12-01 LAB — BLOOD GAS, VENOUS
Acid-Base Excess: 6.7 mmol/L — ABNORMAL HIGH (ref 0.0–2.0)
Bicarbonate: 33.3 mmol/L — ABNORMAL HIGH (ref 20.0–28.0)
O2 Saturation: 50.8 %
Patient temperature: 37
pCO2, Ven: 55 mmHg (ref 44–60)
pH, Ven: 7.39 (ref 7.25–7.43)
pO2, Ven: 34 mmHg (ref 32–45)

## 2022-12-01 LAB — RESP PANEL BY RT-PCR (FLU A&B, COVID) ARPGX2
Influenza A by PCR: NEGATIVE
Influenza B by PCR: NEGATIVE
SARS Coronavirus 2 by RT PCR: NEGATIVE

## 2022-12-01 LAB — TROPONIN I (HIGH SENSITIVITY)
Troponin I (High Sensitivity): 4 ng/L (ref <18)
Troponin I (High Sensitivity): 4 ng/L (ref <18)

## 2022-12-01 MED ORDER — DIAZEPAM 5 MG PO TABS
10.0000 mg | ORAL_TABLET | Freq: Once | ORAL | Status: AC
  Administered 2022-12-01: 10 mg via ORAL
  Filled 2022-12-01: qty 2

## 2022-12-01 MED ORDER — SODIUM CHLORIDE 0.9 % IV SOLN
2.0000 g | Freq: Once | INTRAVENOUS | Status: AC
  Administered 2022-12-01: 2 g via INTRAVENOUS
  Filled 2022-12-01: qty 12.5

## 2022-12-01 MED ORDER — VANCOMYCIN HCL IN DEXTROSE 1-5 GM/200ML-% IV SOLN
1000.0000 mg | Freq: Once | INTRAVENOUS | Status: AC
  Administered 2022-12-01: 1000 mg via INTRAVENOUS
  Filled 2022-12-01: qty 200

## 2022-12-01 MED ORDER — IOHEXOL 350 MG/ML SOLN
75.0000 mL | Freq: Once | INTRAVENOUS | Status: AC | PRN
  Administered 2022-12-01: 75 mL via INTRAVENOUS

## 2022-12-01 MED ORDER — IPRATROPIUM-ALBUTEROL 0.5-2.5 (3) MG/3ML IN SOLN
3.0000 mL | Freq: Once | RESPIRATORY_TRACT | Status: AC
  Administered 2022-12-01: 3 mL via RESPIRATORY_TRACT
  Filled 2022-12-01: qty 3

## 2022-12-01 NOTE — ED Provider Triage Note (Signed)
Emergency Medicine Provider Triage Evaluation Note  Mark Yu , a 66 y.o. male  was evaluated in triage.  Pt complains of shortness of breath. States symptoms began last night. Felt like his breathing was "different" than usual. States this morning checked his O2 sat and it was 70%. He had a follow-up appoint for gtube removal today and was 82-84% on room air. Pt is metastatic SCC of the lung. Recently started new chemo regimen Thursday. Endorses palpitations without chest pain. Does not wear O2 at home. Denies fevers.  Review of Systems  Positive: See above Negative:   Physical Exam  BP 111/70   Pulse (!) 120   Temp 98.3 F (36.8 C) (Oral)   Resp 10   SpO2 92%  Gen:   Awake, thin  Resp:  Tachypneic, O2 93% on 4LNC. Wheezing in upper lung fields.  MSK:   Moves extremities without difficulty  Other:  Tachycardia to 120s, abdomen is flat and soft.   Medical Decision Making  Medically screening exam initiated at 2:24 PM.  Appropriate orders placed.  Mark Yu was informed that the remainder of the evaluation will be completed by another provider, this initial triage assessment does not replace that evaluation, and the importance of remaining in the ED until their evaluation is complete.  Will start sepsis work up given hypoxia, tachycardia. Will also order CTA PE study given history. Differential includes chemo induced pneumonitis, pneumonia, PE, mass effect. Imaging to help better characterize    Mickie Hillier, PA-C 12/20/2022 1427

## 2022-12-01 NOTE — Progress Notes (Signed)
Patient ID: Mark Yu, male   DOB: 1956/06/16, 66 y.o.   MRN: [id number] Pt presented to IR dept today unaccompanied  for G tube site check. Hx met small cell lung ca being followed by Dr. Julien Nordmann. Pt reports that current care regimen to G tube incl lidocaine jelly, topical zinc has improved appearance of site so recommended he cont with this regimen for now. If worsening occurs rec f/u with wound care nurse. Pt's vitals today incl BP 118/102, O2 sats in upper 80'S on RA, 90% on 2 liters [place]. HR 130-140. Pt afebrile; denies CP but does have have sl more dyspnea with exertion than usual, occ cough, continued weakness; no N/V or bleeding. Above d/w Dr. Worthy Flank staff and they recommend further evaluation of pt in ED. Pt notified of plans.

## 2022-12-01 NOTE — ED Provider Notes (Addendum)
[facility] DEPT Provider Note   CSN: [id number] Arrival date & time: 12/14/2022  1348     History  Chief Complaint  Patient presents with   Shortness of Breath    Mark Yu is a 66 y.o. male. Past Medical History:  Diagnosis Date   Anxiety    tx xanax   COPD (chronic obstructive pulmonary disease) ([place])    tx inhalers   Depression    Dyspnea    with exertion   HTN (hypertension)    tx amlodipine   Hyponatremia 03/08/2022     Shortness of Breath Started new chemo last Thursday and states he feels his current symptoms are related to this. Started having breathing difficulty Sunday morning. This has worsened since then. Not on oxygen at home. Taking his COPD meds as prescribed. Endorses congestion but not much of a cough. Denies fevers or chills. Denies chest pain. Currently smokes 1 or 2 cigarettes a day. Denies history of heart failure. Denies pain or swelling in legs. Denies history of blood clot. Denies waking up at night gasping for air. Denies struggling to breath if lying flat. Endorses palpitations.    Home Medications Prior to Admission medications   Medication Sig Start Date End Date Taking? Authorizing Provider  acetaminophen (TYLENOL) 325 MG tablet 1 tablet as needed Orally every 4 hrs Patient not taking: Reported on 11/10/2022    [provider]  albuterol (PROVENTIL) (2.5 MG/3ML) 0.083% nebulizer solution Take 2.5 mg by nebulization every 6 (six) hours as needed for wheezing or shortness of breath.    [provider]  amLODipine (NORVASC) 5 MG tablet Take 5 mg by mouth daily. 12/28/21   [provider]  BREZTRI AEROSPHERE 160-9-4.8 MCG/ACT AERO Inhale 2 puffs into the lungs in the morning and at bedtime. 01/02/22   [provider]  dextromethorphan-guaiFENesin (ROBITUSSIN-DM) 10-100 MG/5ML liquid Take 10 mLs by mouth 2 (two) times daily.    [provider]  diazepam (VALIUM) 10 MG tablet  1 tablet as needed Orally twice a day for 30 days 11/18/22   [provider]  doxycycline (VIBRA-TABS) 100 MG tablet Take 1 tablet (100 mg total) by mouth 2 (two) times daily. 11/24/22   Jacqualine Mau, NP  fluticasone (FLONASE) 50 MCG/ACT nasal spray Place 2 sprays into both nostrils daily as needed for allergies or rhinitis.    [provider]  lidocaine (XYLOCAINE) 2 % jelly Apply 1 Application topically 4 (four) times daily as needed. 11/24/22   Gareth Morgan, MD  lidocaine-prilocaine (EMLA) cream Apply to the Port-A-Cath site 30-60 minutes before chemotherapy 02/09/22   Curt Bears, MD  loperamide (IMODIUM) 2 MG capsule Take 2 tabs by mouth with first loose stool, then 1 tab with each additional loose stool as needed. Do not exceed 8 tabs in a 24-hour period 11/24/22   Curt Bears, MD  Nutritional Supplements (FEEDING SUPPLEMENT, OSMOLITE 1.5 CAL,) LIQD Place 1,000 mLs into feeding tube continuous. 04/17/22   Charlynne Cousins, MD  omeprazole (PRILOSEC OTC) 20 MG tablet Take 1 tablet (20 mg total) by mouth 2 (two) times daily. 11/24/22   Jacqualine Mau, NP  pantoprazole (PROTONIX) 20 MG tablet Take 2 tablets (40 mg total) by mouth daily. 11/24/22 12/24/22  Gareth Morgan, MD  predniSONE (DELTASONE) 10 MG tablet TAKE TWO TABLETS BY MOUTH DAILY WITH BREAKFAST 09/01/22   Collene Gobble, MD  prochlorperazine (COMPAZINE) 10 MG tablet Take 1 tablet (10 mg total) by mouth every  6 (six) hours as needed for nausea or vomiting. 11/24/22   Curt Bears, MD  sildenafil (REVATIO) 20 MG tablet 1 tablet Orally Once a day for 90 days Patient not taking: Reported on 11/10/2022 10/01/22 01/31/23  [provider]  traMADol Veatrice Bourbon) 50 MG tablet 1 tablet as needed Orally three times a day 11/13/22   [provider]  Water For Irrigation, Sterile (FREE WATER) SOLN Place 150 mLs into feeding tube 6 (six) times daily. 04/17/22   Charlynne Cousins, MD   ZANAFLEX 4 MG tablet 1 tablet as needed Orally every 6 hours for 30 days 11/05/22   [provider]      Allergies    Amoxicillin and Macrolides and ketolides    Review of Systems   Review of Systems  Respiratory:  Positive for shortness of breath.     Physical Exam Updated Vital Signs BP 111/70   Pulse (!) 120   Temp 98.3 F (36.8 C) (Oral)   Resp 10   SpO2 92%  Physical Exam Constitutional:      General: He is not in acute distress. HENT:     Head: Normocephalic and atraumatic.     Mouth/Throat:     Mouth: Mucous membranes are moist.  Eyes:     Extraocular Movements: Extraocular movements intact.  Cardiovascular:     Rate and Rhythm: Regular rhythm. Tachycardia present.  Pulmonary:     Effort: Tachypnea present.     Comments: Poor air movement. Wheezing in upper lung fields. No rales. On 4L O2 via .  Musculoskeletal:     Cervical back: Neck supple.     Right lower leg: No edema.     Left lower leg: No edema.     Comments: Lower extremities without edema, warmth, erythema or tenderness.  Skin:    General: Skin is warm and dry.     Capillary Refill: Capillary refill takes less than 2 seconds.  Neurological:     Mental Status: He is alert and oriented to person, place, and time.  Psychiatric:        Mood and Affect: Mood normal.        Behavior: Behavior normal.     ED Results / Procedures / Treatments   Labs (all labs ordered are listed, but only abnormal results are displayed) Labs Reviewed  CBG MONITORING, ED - Abnormal; Notable for the following components:      Result Value   Glucose-Capillary 124 (*)    All other components within normal limits  RESP PANEL BY RT-PCR (FLU A&B, COVID) ARPGX2  CULTURE, BLOOD (ROUTINE X 2)  CULTURE, BLOOD (ROUTINE X 2)  COMPREHENSIVE METABOLIC PANEL  CBC WITH DIFFERENTIAL/PLATELET  URINALYSIS, ROUTINE W REFLEX MICROSCOPIC  BLOOD GAS, VENOUS  TROPONIN I (HIGH SENSITIVITY)    EKG EKG  Interpretation  Date/Time:  Tuesday December 01 2022 14:17:04 EST Ventricular Rate:  121 PR Interval:  133 QRS Duration: 101 QT Interval:  344 QTC Calculation: 489 R Axis:   110 Text Interpretation: Sinus tachycardia Ventricular premature complex Aberrant conduction of SV complex(es) Consider right atrial enlargement Left posterior fascicular block Abnormal R-wave progression, late transition Borderline prolonged QT interval Since last tracing rate faster Confirmed by Dorie Rank [phone]) on 12/19/2022 2:23:29 PM  Radiology DG Chest Port 1 View  Result Date: 12/20/2022 CLINICAL DATA:  Shortness of breath. History of metastatic small cell lung cancer. EXAM: PORTABLE CHEST 1 VIEW COMPARISON:  April 07, 2022. FINDINGS: The heart size and mediastinal contours  are within normal limits. Right internal jugular Port-A-Cath is unchanged. Tracheostomy tube has been removed. Hyperexpansion of the lungs is noted with probable emphysematous disease. Nodular opacity is seen in right upper lobe consistent with malignancy or metastatic disease. The visualized skeletal structures are unremarkable. IMPRESSION: Hyperexpansion of the lungs is noted with emphysematous disease. Nodular opacity is noted in right upper lobe consistent with malignancy or metastatic disease. Aortic Atherosclerosis (ICD10-I70.0) and Emphysema (ICD10-J43.9). Electronically Signed   By: Marijo Conception M.D.   On: 12/16/2022 14:57    Procedures Procedures    Medications Ordered in ED Medications  ipratropium-albuterol (DUONEB) 0.5-2.5 (3) MG/3ML nebulizer solution 3 mL (has no administration in time range)    ED Course/ Medical Decision Making/ A&P                           Medical Decision Making Risk Prescription drug management. Decision regarding hospitalization.   Patient presents with worsening SOB for 2 days in the setting of his metastatic lung cancer. Presents with hypoxia and tachycardia. ECG nonischemic. CXR without  opacity outside of known RUL nodule. Also without pneumothorax or pleural effusion. Respiratory panel negative. CBC with mild leukocytosis. CMP unremarkable. Troponin negative. CT angio chest did not demonstrate PE but revealed multifocal pneumonia.   Patient has received Duoneb x 1. Will start HAP treatment with IV cefepime and vancomycin. Blood cultures ordered.  Will consult for admission given multifocal pneumonia requiring IV antibiotics and new oxygen requirement.   Final Clinical Impression(s) / ED Diagnoses Final diagnoses:  None    Rx / DC Orders ED Discharge Orders     None         Linward Natal, MD 12/16/2022 Paulette Blanch    Linward Natal, MD 12/15/2022 Marko Stai    Linward Natal, MD 12/10/2022 1900    Carmin Muskrat, MD 12/20/2022 2258

## 2022-12-01 NOTE — ED Triage Notes (Addendum)
Pt arrived via wheelchair. From IR. Was going to have G tube replaced today, was found to be hypoxic, tachycardic, SOB. Denies any chest pain.   84% on RA. Placed on 3L [place]

## 2022-12-01 NOTE — H&P (Signed)
History and Physical    Patient: Mark Yu HKV:[id number] DOB: Feb 10, 1956 DOA: 12/09/2022 DOS: the patient was seen and examined on 12/02/2022 PCP: Jennette Dubin, NP  Patient coming from: Home  Chief Complaint:  Chief Complaint  Patient presents with   Shortness of Breath   HPI: Javian Nudd is a 66 y.o. 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  Pt recently found to have progression of his lung malignancy to his liver. Since then has been evaluated in ED for urinary retention discharged with indwelling foley on 11/21. He was also given Doxycycline for possible cellulitis around G-tube stoma. Then on 11/28 had increase leakage to his G-tube stoma and IR consulted with recommendation to allow GI site to heal with zinc and lidocaine. Today he presented to IR dept for G tube stoma check and was noted have hypoxic in the 80% on room air requiring 2L [place] and was advised to present to ED.   Has been feeling short of breath with cough for the since past 4 days. Has his first round of 3rd line chemotherapy a few days prior to that and was feeling good. No nausea, vomiting or diarrhea.   In the ED, he was afebrile, tachycardic, normotensive on room air.  WBC of 13.6. No significant electrolyte abnormality.   Negative flu/COVID PCR. Negative UA.  CTA chest with patchy infiltrate in both lower lobes suggesting multifocal pneumonia.  T9 vertebrae with 50% decrease in ht due to trauma vs skeletal metastatic diease  Review of Systems: As mentioned in the history of present illness. All other systems reviewed and are negative. Past Medical History:  Diagnosis Date   Anxiety    tx xanax   COPD (chronic obstructive pulmonary disease) (HCC)    tx inhalers   Depression    Dyspnea    with exertion   HTN (hypertension)    tx  amlodipine   Hyponatremia 03/08/2022   Past Surgical History:  Procedure Laterality Date   BRONCHIAL BIOPSY  02/02/2022   Procedure: BRONCHIAL BIOPSIES;  Surgeon: Collene Gobble, MD;  Location: [facility];  Service: Pulmonary;;   BRONCHIAL BRUSHINGS  02/02/2022   Procedure: BRONCHIAL BRUSHINGS;  Surgeon: Collene Gobble, MD;  Location: [facility];  Service: Pulmonary;;   BRONCHIAL NEEDLE ASPIRATION BIOPSY  02/02/2022   Procedure: BRONCHIAL NEEDLE ASPIRATION BIOPSIES;  Surgeon: Collene Gobble, MD;  Location: [facility];  Service: Pulmonary;;   IR GASTROSTOMY TUBE MOD SED  04/03/2022   IR GASTROSTOMY TUBE REMOVAL  11/04/2022   IR IMAGING GUIDED PORT INSERTION  02/24/2022   IR PATIENT EVAL TECH 0-60 MINS  12/12/2022   MRI     NO PAST SURGERIES     VIDEO BRONCHOSCOPY WITH ENDOBRONCHIAL ULTRASOUND N/A 02/02/2022   Procedure: VIDEO BRONCHOSCOPY WITH ENDOBRONCHIAL ULTRASOUND;  Surgeon: Collene Gobble, MD;  Location: [facility];  Service: Pulmonary;  Laterality: N/A;   VIDEO BRONCHOSCOPY WITH RADIAL ENDOBRONCHIAL ULTRASOUND  02/02/2022   Procedure: VIDEO BRONCHOSCOPY WITH RADIAL ENDOBRONCHIAL ULTRASOUND;  Surgeon: Collene Gobble, MD;  Location: [facility];  Service: Pulmonary;;   Social History:  reports that he quit smoking about 8 months ago. His smoking use included cigarettes. He has a 12.50 pack-year smoking history. He does not have any smokeless tobacco history on file. He reports current alcohol use of about 30.0 standard drinks of alcohol per week.  He reports current drug use. Drug: Marijuana.  Allergies  Allergen Reactions   Amoxicillin Rash   Macrolides And Ketolides Other (See Comments)    Reaction not recalled    Family History  Problem Relation Age of Onset   Alzheimer's disease Mother    Multiple sclerosis Father     Prior to Admission medications   Medication Sig Start Date End Date Taking? Authorizing Provider  acetaminophen (TYLENOL) 325 MG tablet 1 tablet as needed  Orally every 4 hrs Patient not taking: Reported on 11/10/2022    [provider]  albuterol (PROVENTIL) (2.5 MG/3ML) 0.083% nebulizer solution Take 2.5 mg by nebulization every 6 (six) hours as needed for wheezing or shortness of breath.    [provider]  amLODipine (NORVASC) 5 MG tablet Take 5 mg by mouth daily. 12/28/21   [provider]  BREZTRI AEROSPHERE 160-9-4.8 MCG/ACT AERO Inhale 2 puffs into the lungs in the morning and at bedtime. 01/02/22   [provider]  dextromethorphan-guaiFENesin (ROBITUSSIN-DM) 10-100 MG/5ML liquid Take 10 mLs by mouth 2 (two) times daily.    [provider]  diazepam (VALIUM) 10 MG tablet 1 tablet as needed Orally twice a day for 30 days 11/18/22   [provider]  doxycycline (VIBRA-TABS) 100 MG tablet Take 1 tablet (100 mg total) by mouth 2 (two) times daily. 11/24/22   Jacqualine Mau, NP  fluticasone (FLONASE) 50 MCG/ACT nasal spray Place 2 sprays into both nostrils daily as needed for allergies or rhinitis.    [provider]  lidocaine (XYLOCAINE) 2 % jelly Apply 1 Application topically 4 (four) times daily as needed. 11/24/22   Gareth Morgan, MD  lidocaine-prilocaine (EMLA) cream Apply to the Port-A-Cath site 30-60 minutes before chemotherapy 02/09/22   Curt Bears, MD  loperamide (IMODIUM) 2 MG capsule Take 2 tabs by mouth with first loose stool, then 1 tab with each additional loose stool as needed. Do not exceed 8 tabs in a 24-hour period 11/24/22   Curt Bears, MD  Nutritional Supplements (FEEDING SUPPLEMENT, OSMOLITE 1.5 CAL,) LIQD Place 1,000 mLs into feeding tube continuous. 04/17/22   Charlynne Cousins, MD  omeprazole (PRILOSEC OTC) 20 MG tablet Take 1 tablet (20 mg total) by mouth 2 (two) times daily. 11/24/22   Jacqualine Mau, NP  pantoprazole (PROTONIX) 20 MG tablet Take 2 tablets (40 mg total) by mouth daily. 11/24/22 12/24/22  Gareth Morgan, MD  predniSONE  (DELTASONE) 10 MG tablet TAKE TWO TABLETS BY MOUTH DAILY WITH BREAKFAST 09/01/22   Collene Gobble, MD  prochlorperazine (COMPAZINE) 10 MG tablet Take 1 tablet (10 mg total) by mouth every 6 (six) hours as needed for nausea or vomiting. 11/24/22   Curt Bears, MD  sildenafil (REVATIO) 20 MG tablet 1 tablet Orally Once a day for 90 days Patient not taking: Reported on 11/10/2022 10/01/22 01/31/23  [provider]  traMADol Veatrice Bourbon) 50 MG tablet 1 tablet as needed Orally three times a day 11/13/22   [provider]  Water For Irrigation, Sterile (FREE WATER) SOLN Place 150 mLs into feeding tube 6 (six) times daily. 04/17/22   Charlynne Cousins, MD  ZANAFLEX 4 MG tablet 1 tablet as needed Orally every 6 hours for 30 days 11/05/22   [provider]    Physical Exam: Vitals:   12/12/2022 2000 12/11/2022 2015 12/17/2022 2300 12/02/22 0000  BP: 129/88  130/78 (!) 121/97  Pulse: 99 99 (!) 104 (!) 106  Resp: 16  16  16  Temp: 98 F (36.7 C)     TempSrc:      SpO2: 99% 99% 95% 99%   Constitutional: NAD, calm, comfortable, elderly chronically ill-appearing male laying at approximately 30 degree incline in bed Eyes: lids and conjunctivae normal ENMT: Mucous membranes are moist.  Neck: normal, supple Respiratory: clear to auscultation bilaterally, no wheezing, no crackles. Normal respiratory effort. No accessory muscle use.  On 2 L via nasal cannula Cardiovascular: Regular rate and rhythm, no murmurs / rubs / gallops. No extremity edema.   Abdomen: no tenderness, Bowel sounds positive.  Small nonhealing stoma noted to left lower quadrant with milky discharge and surrounding erythema Musculoskeletal: no clubbing / cyanosis. No joint deformity upper and lower extremities.  Normal muscle tone.  Skin: See abdominal exam above Neurologic: CN 2-12 grossly intact.  Strength 5/5 in all 4.  Psychiatric: Normal judgment and insight. Alert and oriented x 3. Normal mood. Data  Reviewed:  See HPI  Assessment and Plan: * Acute hypoxic respiratory failure ([place]) - Secondary to multifocal pneumonia in the setting of metastatic lung cancer on chemotherapy -Presented with saturation of 70% requiring 2 L via nasal cannula -Continue IV vancomycin and cefepime - Goal O2 saturation of greater than 92%  COPD (chronic obstructive pulmonary disease) (HCC) -stable. Not in acute exacerbation. 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 Inda Merlin with oncology -unfortunately had recurrent and metastasis of his cancer to liver. Currently on 3rd line palliative chemotherapy with poor prognosis.  Acute urinary retention S/p indwelling catheter since being evaluated in ED on 11/17/22 for urinary retention -has appt with urology around 12/10/22 for outpatient voiding trial -continue catheter care   Wound cellulitis -stoma from previous G-tube has been non-healing. Has been following IR and just had it checked today. Has been on doxycycline for close to 2 weeks for wound cellulitis and has been improving -currently on IV vancomycin and cefepime for pneumonia which will provide coverage -wound care per RN   T9 vertebral fracture ([place]) -T9 vertebrae with 50% decrease in ht seen on CTA chest. Pt reports multiple falls and hitting his back about a week ago which could be the case. No active pain.  PAF (paroxysmal atrial fibrillation) (HCC) -Currently in sinus rhythm. Not on rate control or anticoagulation.   Essential hypertension Continue amlodipine      Advance Care Planning: Full  Consults: none  Family Communication: none at bedside  Severity of Illness: The appropriate patient status for this patient is OBSERVATION. Observation status is judged to be reasonable and necessary in order to provide the required intensity of service to ensure the patient's safety. The patient's  presenting symptoms, physical exam findings, and initial radiographic and laboratory data in the context of their medical condition is felt to place them at decreased risk for further clinical deterioration. Furthermore, it is anticipated that the patient will be medically stable for discharge from the hospital within 2 midnights of admission.   Author: Orene Desanctis, DO 12/02/2022 12:49 AM  For on call review [email or web address].

## 2022-12-01 NOTE — Procedures (Signed)
Please refer to Rowe Robert PA-C note below.    The patient's gtube removal site was evaluated by Rowe Robert. The dressing was changed. He was escorted to the ED in his wheelchair  Pt presented to IR dept today unaccompanied for G tube site check. Hx met small cell lung ca being followed by Dr. Julien Nordmann. Pt reports that current care regimen to G tube incl lidocaine jelly, topical zinc has improved appearance of site so recommended he cont with this regimen for now. If worsening occurs rec f/u with wound care nurse. Pt's vitals today incl BP 118/102, O2 sats in upper 80'S on RA, 90% on 2 liters [place]. HR 130-140. Pt afebrile; denies CP but does have have sl more dyspnea with exertion than usual, occ cough, continued weakness; no N/V or bleeding. Above d/w Dr. Worthy Flank staff and they recommend further evaluation of pt in ED. Pt notified of plans.

## 2022-12-01 NOTE — Progress Notes (Signed)
Pharmacy Note   A consult was received from an ED physician for vancomycin per pharmacy dosing.    The patient's profile has been reviewed for ht/wt/allergies/indication/available labs.    A one time order has been placed for vancomycin 1000 mg IV x1 .    Further antibiotics/pharmacy consults should be ordered by admitting physician if indicated.                       Thank you,  Royetta Asal, PharmD, BCPS 12/04/2022 6:04 PM

## 2022-12-02 ENCOUNTER — Other Ambulatory Visit: Payer: Self-pay

## 2022-12-02 DIAGNOSIS — E43 Unspecified severe protein-calorie malnutrition: Secondary | ICD-10-CM | POA: Diagnosis present

## 2022-12-02 DIAGNOSIS — J439 Emphysema, unspecified: Secondary | ICD-10-CM | POA: Diagnosis present

## 2022-12-02 DIAGNOSIS — R630 Anorexia: Secondary | ICD-10-CM | POA: Diagnosis not present

## 2022-12-02 DIAGNOSIS — I10 Essential (primary) hypertension: Secondary | ICD-10-CM | POA: Diagnosis present

## 2022-12-02 DIAGNOSIS — R64 Cachexia: Secondary | ICD-10-CM | POA: Diagnosis present

## 2022-12-02 DIAGNOSIS — R338 Other retention of urine: Secondary | ICD-10-CM

## 2022-12-02 DIAGNOSIS — J189 Pneumonia, unspecified organism: Secondary | ICD-10-CM | POA: Diagnosis present

## 2022-12-02 DIAGNOSIS — J440 Chronic obstructive pulmonary disease with acute lower respiratory infection: Secondary | ICD-10-CM | POA: Diagnosis present

## 2022-12-02 DIAGNOSIS — L0390 Cellulitis, unspecified: Secondary | ICD-10-CM

## 2022-12-02 DIAGNOSIS — G9341 Metabolic encephalopathy: Secondary | ICD-10-CM | POA: Diagnosis not present

## 2022-12-02 DIAGNOSIS — Z515 Encounter for palliative care: Secondary | ICD-10-CM | POA: Diagnosis not present

## 2022-12-02 DIAGNOSIS — Z1152 Encounter for screening for COVID-19: Secondary | ICD-10-CM | POA: Diagnosis not present

## 2022-12-02 DIAGNOSIS — Z66 Do not resuscitate: Secondary | ICD-10-CM | POA: Diagnosis not present

## 2022-12-02 DIAGNOSIS — J9601 Acute respiratory failure with hypoxia: Secondary | ICD-10-CM | POA: Diagnosis not present

## 2022-12-02 DIAGNOSIS — S22078D Other fracture of T9-T10 vertebra, subsequent encounter for fracture with routine healing: Secondary | ICD-10-CM

## 2022-12-02 DIAGNOSIS — C3412 Malignant neoplasm of upper lobe, left bronchus or lung: Secondary | ICD-10-CM | POA: Diagnosis present

## 2022-12-02 DIAGNOSIS — I480 Paroxysmal atrial fibrillation: Secondary | ICD-10-CM | POA: Diagnosis present

## 2022-12-02 DIAGNOSIS — C787 Secondary malignant neoplasm of liver and intrahepatic bile duct: Secondary | ICD-10-CM | POA: Diagnosis present

## 2022-12-02 DIAGNOSIS — J9622 Acute and chronic respiratory failure with hypercapnia: Secondary | ICD-10-CM | POA: Diagnosis present

## 2022-12-02 DIAGNOSIS — L03818 Cellulitis of other sites: Secondary | ICD-10-CM | POA: Diagnosis present

## 2022-12-02 DIAGNOSIS — E8729 Other acidosis: Secondary | ICD-10-CM | POA: Diagnosis not present

## 2022-12-02 DIAGNOSIS — F32A Depression, unspecified: Secondary | ICD-10-CM | POA: Diagnosis present

## 2022-12-02 DIAGNOSIS — Z978 Presence of other specified devices: Secondary | ICD-10-CM

## 2022-12-02 DIAGNOSIS — J449 Chronic obstructive pulmonary disease, unspecified: Secondary | ICD-10-CM

## 2022-12-02 DIAGNOSIS — J441 Chronic obstructive pulmonary disease with (acute) exacerbation: Secondary | ICD-10-CM | POA: Diagnosis present

## 2022-12-02 DIAGNOSIS — J9621 Acute and chronic respiratory failure with hypoxia: Secondary | ICD-10-CM | POA: Diagnosis present

## 2022-12-02 DIAGNOSIS — R296 Repeated falls: Secondary | ICD-10-CM | POA: Diagnosis present

## 2022-12-02 DIAGNOSIS — K9422 Gastrostomy infection: Secondary | ICD-10-CM | POA: Diagnosis present

## 2022-12-02 DIAGNOSIS — Z681 Body mass index (BMI) 19 or less, adult: Secondary | ICD-10-CM | POA: Diagnosis not present

## 2022-12-02 DIAGNOSIS — S22079A Unspecified fracture of T9-T10 vertebra, initial encounter for closed fracture: Secondary | ICD-10-CM | POA: Diagnosis present

## 2022-12-02 DIAGNOSIS — I2510 Atherosclerotic heart disease of native coronary artery without angina pectoris: Secondary | ICD-10-CM | POA: Diagnosis present

## 2022-12-02 LAB — CBC
HCT: 31.9 % — ABNORMAL LOW (ref 39.0–52.0)
Hemoglobin: 10.1 g/dL — ABNORMAL LOW (ref 13.0–17.0)
MCH: 31.8 pg (ref 26.0–34.0)
MCHC: 31.7 g/dL (ref 30.0–36.0)
MCV: 100.3 fL — ABNORMAL HIGH (ref 80.0–100.0)
Platelets: 202 10*3/uL (ref 150–400)
RBC: 3.18 MIL/uL — ABNORMAL LOW (ref 4.22–5.81)
RDW: 16.7 % — ABNORMAL HIGH (ref 11.5–15.5)
WBC: 10.3 10*3/uL (ref 4.0–10.5)
nRBC: 0 % (ref 0.0–0.2)

## 2022-12-02 LAB — BASIC METABOLIC PANEL
Anion gap: 9 (ref 5–15)
BUN: 13 mg/dL (ref 8–23)
CO2: 30 mmol/L (ref 22–32)
Calcium: 8.7 mg/dL — ABNORMAL LOW (ref 8.9–10.3)
Chloride: 97 mmol/L — ABNORMAL LOW (ref 98–111)
Creatinine, Ser: 0.56 mg/dL — ABNORMAL LOW (ref 0.61–1.24)
GFR, Estimated: >60 mL/min (ref >60)
Glucose, Bld: 84 mg/dL (ref 70–99)
Potassium: 3.4 mmol/L — ABNORMAL LOW (ref 3.5–5.1)
Sodium: 136 mmol/L (ref 135–145)

## 2022-12-02 LAB — MRSA NEXT GEN BY PCR, NASAL: MRSA by PCR Next Gen: NOT DETECTED

## 2022-12-02 LAB — MAGNESIUM: Magnesium: 2.1 mg/dL (ref 1.7–2.4)

## 2022-12-02 LAB — STREP PNEUMONIAE URINARY ANTIGEN: Strep Pneumo Urinary Antigen: NEGATIVE

## 2022-12-02 MED ORDER — METOPROLOL TARTRATE 5 MG/5ML IV SOLN
5.0000 mg | Freq: Once | INTRAVENOUS | Status: AC
  Administered 2022-12-02: 5 mg via INTRAVENOUS
  Filled 2022-12-02: qty 5

## 2022-12-02 MED ORDER — LORATADINE 10 MG PO TABS
10.0000 mg | ORAL_TABLET | Freq: Every day | ORAL | Status: DC
  Administered 2022-12-02 – 2022-12-03 (×2): 10 mg via ORAL
  Filled 2022-12-02 (×2): qty 1

## 2022-12-02 MED ORDER — SODIUM CHLORIDE 0.9 % IV SOLN
2.0000 g | Freq: Three times a day (TID) | INTRAVENOUS | Status: DC
  Administered 2022-12-02 – 2022-12-06 (×13): 2 g via INTRAVENOUS
  Filled 2022-12-02 (×15): qty 12.5

## 2022-12-02 MED ORDER — TRAMADOL HCL 50 MG PO TABS
50.0000 mg | ORAL_TABLET | Freq: Three times a day (TID) | ORAL | Status: DC | PRN
  Administered 2022-12-03: 50 mg via ORAL
  Filled 2022-12-02: qty 1

## 2022-12-02 MED ORDER — UMECLIDINIUM BROMIDE 62.5 MCG/ACT IN AEPB
1.0000 | INHALATION_SPRAY | Freq: Every day | RESPIRATORY_TRACT | Status: DC
  Administered 2022-12-02: 1 via RESPIRATORY_TRACT
  Filled 2022-12-02: qty 7

## 2022-12-02 MED ORDER — SODIUM CHLORIDE 0.9 % IV SOLN: INTRAVENOUS | Status: DC

## 2022-12-02 MED ORDER — IBUPROFEN 200 MG PO TABS
400.0000 mg | ORAL_TABLET | Freq: Four times a day (QID) | ORAL | Status: DC | PRN
  Administered 2022-12-03 (×2): 400 mg via ORAL
  Filled 2022-12-02 (×2): qty 2

## 2022-12-02 MED ORDER — PREDNISONE 20 MG PO TABS
20.0000 mg | ORAL_TABLET | Freq: Every day | ORAL | Status: DC
  Administered 2022-12-02 – 2022-12-03 (×2): 20 mg via ORAL
  Filled 2022-12-02 (×2): qty 1

## 2022-12-02 MED ORDER — ENOXAPARIN SODIUM 30 MG/0.3ML IJ SOSY
30.0000 mg | PREFILLED_SYRINGE | INTRAMUSCULAR | Status: DC
  Administered 2022-12-02 – 2022-12-06 (×5): 30 mg via SUBCUTANEOUS
  Filled 2022-12-02 (×5): qty 0.3

## 2022-12-02 MED ORDER — BUDESON-GLYCOPYRROL-FORMOTEROL 160-9-4.8 MCG/ACT IN AERO: 2.0000 | INHALATION_SPRAY | Freq: Two times a day (BID) | RESPIRATORY_TRACT | Status: DC

## 2022-12-02 MED ORDER — CHLORHEXIDINE GLUCONATE CLOTH 2 % EX PADS
6.0000 | MEDICATED_PAD | Freq: Every day | CUTANEOUS | Status: DC
  Administered 2022-12-02 – 2022-12-05 (×4): 6 via TOPICAL

## 2022-12-02 MED ORDER — POTASSIUM CHLORIDE CRYS ER 10 MEQ PO TBCR
40.0000 meq | EXTENDED_RELEASE_TABLET | Freq: Once | ORAL | Status: AC
  Administered 2022-12-02: 40 meq via ORAL
  Filled 2022-12-02: qty 4

## 2022-12-02 MED ORDER — VANCOMYCIN HCL IN DEXTROSE 1-5 GM/200ML-% IV SOLN
1000.0000 mg | INTRAVENOUS | Status: DC
  Administered 2022-12-02: 1000 mg via INTRAVENOUS
  Filled 2022-12-02: qty 200

## 2022-12-02 MED ORDER — OMEPRAZOLE MAGNESIUM 20 MG PO TBEC: 20.0000 mg | DELAYED_RELEASE_TABLET | Freq: Two times a day (BID) | ORAL | Status: DC

## 2022-12-02 MED ORDER — LEVALBUTEROL HCL 0.63 MG/3ML IN NEBU
0.6300 mg | INHALATION_SOLUTION | Freq: Three times a day (TID) | RESPIRATORY_TRACT | Status: DC
  Administered 2022-12-02 – 2022-12-06 (×13): 0.63 mg via RESPIRATORY_TRACT
  Filled 2022-12-02 (×13): qty 3

## 2022-12-02 MED ORDER — PANTOPRAZOLE SODIUM 40 MG PO TBEC
40.0000 mg | DELAYED_RELEASE_TABLET | Freq: Two times a day (BID) | ORAL | Status: DC
  Administered 2022-12-02 – 2022-12-03 (×4): 40 mg via ORAL
  Filled 2022-12-02 (×4): qty 1

## 2022-12-02 MED ORDER — DIAZEPAM 5 MG PO TABS
10.0000 mg | ORAL_TABLET | Freq: Two times a day (BID) | ORAL | Status: DC | PRN
  Administered 2022-12-02 – 2022-12-03 (×4): 10 mg via ORAL
  Filled 2022-12-02 (×4): qty 2

## 2022-12-02 MED ORDER — PROCHLORPERAZINE MALEATE 10 MG PO TABS: 10.0000 mg | ORAL_TABLET | Freq: Four times a day (QID) | ORAL | Status: DC | PRN

## 2022-12-02 MED ORDER — FLUTICASONE PROPIONATE 50 MCG/ACT NA SUSP
2.0000 | Freq: Every day | NASAL | Status: DC
  Administered 2022-12-03: 2 via NASAL
  Filled 2022-12-02: qty 16

## 2022-12-02 MED ORDER — AMLODIPINE BESYLATE 5 MG PO TABS
5.0000 mg | ORAL_TABLET | Freq: Every day | ORAL | Status: DC
  Administered 2022-12-02 – 2022-12-03 (×2): 5 mg via ORAL
  Filled 2022-12-02 (×2): qty 1

## 2022-12-02 MED ORDER — LIDOCAINE HCL URETHRAL/MUCOSAL 2 % EX GEL: 1.0000 | Freq: Four times a day (QID) | CUTANEOUS | Status: DC | PRN

## 2022-12-02 MED ORDER — GUAIFENESIN ER 600 MG PO TB12
1200.0000 mg | ORAL_TABLET | Freq: Two times a day (BID) | ORAL | Status: DC
  Administered 2022-12-02 – 2022-12-03 (×4): 1200 mg via ORAL
  Filled 2022-12-02 (×4): qty 2

## 2022-12-02 MED ORDER — IPRATROPIUM BROMIDE 0.02 % IN SOLN
0.5000 mg | Freq: Three times a day (TID) | RESPIRATORY_TRACT | Status: DC
  Administered 2022-12-02 – 2022-12-06 (×12): 0.5 mg via RESPIRATORY_TRACT
  Filled 2022-12-02 (×12): qty 2.5

## 2022-12-02 MED ORDER — ZINC OXIDE 11.3 % EX CREA
TOPICAL_CREAM | Freq: Two times a day (BID) | CUTANEOUS | Status: DC
  Filled 2022-12-02 (×2): qty 56

## 2022-12-02 MED ORDER — MOMETASONE FURO-FORMOTEROL FUM 100-5 MCG/ACT IN AERO
2.0000 | INHALATION_SPRAY | Freq: Two times a day (BID) | RESPIRATORY_TRACT | Status: DC
  Administered 2022-12-02 – 2022-12-03 (×4): 2 via RESPIRATORY_TRACT
  Filled 2022-12-02: qty 8.8

## 2022-12-02 MED FILL — Dexamethasone Sodium Phosphate Inj 100 MG/10ML: INTRAMUSCULAR | Qty: 1 | Status: AC

## 2022-12-02 NOTE — Assessment & Plan Note (Addendum)
-  Noted to be in sinus tachycardia during the hospitalization and placed on Lopressor.   -Changed to IV Lopressor as patient currently intubated.

## 2022-12-02 NOTE — Progress Notes (Signed)
Pharmacy Antibiotic Note  Eddrick Dilone is a 66 y.o. male admitted on 12/11/2022 with multifocal pneumonia per Chest CT.  Pharmacy has been consulted for Cefepime + Vancomycin dosing.  Plan: Cefepime 2gm IV q8h Vancomycin 1gm IV q24h to target AUC 400-550.  Estimated AUC 537.  Check MRSA PCR Monitor renal function and cx data      Temp (24hrs), Avg:98.1 F (36.7 C), Min:98 F (36.7 C), Max:98.3 F (36.8 C)  Recent Labs  Lab 11/26/22 1110 11/30/2022 1428  WBC 9.7 13.6*  CREATININE 0.47* 0.69    Estimated Creatinine Clearance: 61.1 mL/min (by C-G formula based on SCr of 0.69 mg/dL).    Allergies  Allergen Reactions   Amoxicillin Rash   Macrolides And Ketolides Other (See Comments)    Reaction not recalled    Antimicrobials this admission: 12/5 Cefepime >>  12/5 Vancomycin >>   Dose adjustments this admission:  Microbiology results: 12/5 BCx:  12/5 Resp PCR: negative for infulenza A/B & Covid MRSA PCR:   Thank you for allowing pharmacy to be a part of this patient's care.  Netta Cedars PharmD 12/02/2022 12:43 AM

## 2022-12-02 NOTE — Progress Notes (Signed)
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  Patient noted to have had recent progression of lung cancer to his liver since then has been evaluated in the ED for urinary retention discharged with indwelling Foley 11/21, given Doxy for possible cellulitis around G-tube stoma, 1128 and increased leakage through his G-tube stoma IR consulted recommendation to allow GI site to heal with zinc and lidocaine.  Patient presented to IR on day of admission with G-tube stoma check and noted to be hypoxic with sats of 80% on room air requiring 2 L nasal cannula and sent to the ED.  Patient noted to have had complaints of shortness of breath and cough x4 days.  Patient seen in the ED CT angiogram chest done concerning for patchy infiltrate in both lower lobes suggesting multifocal pneumonia.  Patient admitted and placed empirically on IV antibiotics.   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 -Per admitting physician patient  noted to have sats of 70% requiring 2 L nasal cannula. -Urine strep pneumococcus antigen negative. -Urine Legionella antigen pending. -COVID-19 PCR negative. -Influenza A and B PCR negative. -MRSA PCR negative. -Blood cultures pending. -Patient on 3 L nasal cannula. -Placed on Mucinex 1200 mg twice daily, Flonase, PPI, Atrovent and Xopenex nebs, Claritin. -Continue Dulera. -Continue prednisone. -Supportive care.  2.  COPD -Due to problem #1, placed on scheduled Atrovent and Xopenex nebs, continue prednisone, placed on Mucinex, Flonase, PPI.  3.  Small cell carcinoma of the upper lobe of the left lung -Patient noted to have had recurrent and metastatic disease of his cancer to the liver, currently on third line palliative chemotherapy with a poor prognosis. -Being followed by Dr. Julien Nordmann of oncology. -Outpatient follow-up.  4.  Acute urinary retention -Status post indwelling Foley catheter she has been evaluated in the ED 11/17/2022 for urinary retention. -Patient noted to have an outpatient appointment with urology 12/10/2022 for voiding trial and further evaluation.  5.  Wound cellulitis -Stoma from previous G-tube noted to be nonhealing, being followed by IR, had G-tube check on day of admission, noted to have been on doxycycline for close to 2 weeks for wound cellulitis which has been improving. -Currently on IV vancomycin, cefepime for pneumonia which should provide coverage. -Continue wound care per RN.  6.  T9 vertebral fracture -T9 vertebrae with 50% decrease in height seen on CTA chest. -Per admitting physician patient reported multiple falls and hitting his back about 1 week prior to admission. -Denies any significant pain at this time.  7.  Paroxysmal atrial fibrillation -Currently in  sinus tachycardia. -Not on rate controlling medication or anticoagulation. -IV Lopressor 5 mg x 1. -Patient states heart rate usually in the low 100s. -May need to start patient on  low-dose beta-blocker however will monitor for now.  8.  Hypertension -Norvasc.   DVT prophylaxis: Lovenox Code Status: Full Family Communication: Updated patient.  No family at bedside. Disposition: Likely home when clinically improved.  Status is: Inpatient The patient will require care spanning > 2 midnights and should be moved to inpatient because: Severity of illness   Consultants:  None  Procedures:  CT angiogram chest 12/21/2022 Chest x-ray 12/02/2022  Antimicrobials:  IV cefepime 12/14/2022>>>> IV vancomycin 12/16/2022>>>>>   Subjective: Laying in bed.  States his breathing is a little bit labored however overall somewhat improved from admission.  No chest pain.  No significant abdominal pain.  Objective: Vitals:   12/02/22 0540 12/02/22 0600 12/02/22 0900 12/02/22 0925  BP:  (!) 171/88 (!) 144/86   Pulse:  (!) 116 (!) 122 (!) 122  Resp:  (!) 24 20   Temp: 98.5 F (36.9 C)   99 F (37.2 C)  TempSrc: Oral   Oral  SpO2:  92% 90% 91%    Intake/Output Summary (Last 24 hours) at 12/02/2022 1111 Last data filed at 12/02/2022 0631 Gross per 24 hour  Intake 400 ml  Output 800 ml  Net -400 ml   There were no vitals filed for this visit.  Examination:  General exam: Appears calm and comfortable  Respiratory system: Some decreased breath sounds in the bases.  Poor to fair air movement.   No significant wheezing, crackles or rhonchi.  Respiratory effort normal. Cardiovascular system: Tachycardia, No JVD, murmurs, rubs, gallops or clicks. No pedal edema. Gastrointestinal system: Abdomen is nondistended, soft and nontender. No organomegaly or masses felt. Normal bowel sounds heard.  G-tube covered by bandage. Central nervous system: Alert and oriented. No focal neurological deficits. Extremities: Symmetric 5 x 5 power. Skin: No rashes, lesions or ulcers Psychiatry: Judgement and insight appear normal. Mood & affect appropriate.     Data Reviewed: I have personally  reviewed following labs and imaging studies  CBC: Recent Labs  Lab 11/26/22 1110 11/28/2022 1428 12/02/22 0540  WBC 9.7 13.6* 10.3  NEUTROABS 9.0* 12.8*  --   HGB 10.5* 11.2* 10.1*  HCT 32.2* 35.6* 31.9*  MCV 98.2 100.3* 100.3*  PLT 224 211 761    Basic Metabolic Panel: Recent Labs  Lab 11/26/22 1110 12/12/2022 1428 12/02/22 0540  NA 138 138 136  K 3.3* 3.7 3.4*  CL 101 99 97*  CO2 _0 GLUCOSE 109* 112* 84  BUN _1 CREATININE 0.47* 0.69 0.56*  CALCIUM 9.5 9.2 8.7*  MG  --   --  2.1    GFR: Estimated Creatinine Clearance: 61.1 mL/min (A) (by C-G formula based on SCr of 0.56 mg/dL (L)).  Liver Function Tests: Recent Labs  Lab 11/26/22 1110 12/05/2022 1428  AST 15 20  ALT 11 17  ALKPHOS 111 109  BILITOT 0.8 0.8  PROT 6.7 7.0  ALBUMIN 4.2 4.0    CBG: Recent Labs  Lab 11/30/2022 1433  GLUCAP 124*     Recent Results (from the past 240 hour(s))  Resp Panel by RT-PCR (Flu A&B, Covid) Anterior Nasal Swab     Status: None   Collection Time: 12/09/2022  2:28 PM   Specimen: Anterior Nasal Swab  Result Value Ref Range Status   SARS Coronavirus 2 by RT  PCR NEGATIVE NEGATIVE Final    Comment: (NOTE) SARS-CoV-2 target nucleic acids are NOT DETECTED.  The SARS-CoV-2 RNA is generally detectable in upper respiratory specimens during the acute phase of infection. The lowest concentration of SARS-CoV-2 viral copies this assay can detect is 138 copies/mL. A negative result does not preclude SARS-Cov-2 infection and should not be used as the sole basis for treatment or other patient management decisions. A negative result may occur with  improper specimen collection/handling, submission of specimen other than nasopharyngeal swab, presence of viral mutation(s) within the areas targeted by this assay, and inadequate number of viral copies(<138 copies/mL). A negative result must be combined with clinical observations, patient history, and  epidemiological information. The expected result is Negative.  Fact Sheet for Patients:  EntrepreneurPulse.com.au  Fact Sheet for Healthcare Providers:  IncredibleEmployment.be  This test is no t yet approved or cleared by the [place] FDA and  has been authorized for detection and/or diagnosis of SARS-CoV-2 by FDA under an Emergency Use Authorization (EUA). This EUA will remain  in effect (meaning this test can be used) for the duration of the COVID-19 declaration under Section 564(b)(1) of the Act, 21 U.S.C.section 360bbb-3(b)(1), unless the authorization is terminated  or revoked sooner.       Influenza A by PCR NEGATIVE NEGATIVE Final   Influenza B by PCR NEGATIVE NEGATIVE Final    Comment: (NOTE) The Xpert Xpress SARS-CoV-2/FLU/RSV plus assay is intended as an aid in the diagnosis of influenza from Nasopharyngeal swab specimens and should not be used as a sole basis for treatment. Nasal washings and aspirates are unacceptable for Xpert Xpress SARS-CoV-2/FLU/RSV testing.  Fact Sheet for Patients: EntrepreneurPulse.com.au  Fact Sheet for Healthcare Providers: IncredibleEmployment.be  This test is not yet approved or cleared by the [place] FDA and has been authorized for detection and/or diagnosis of SARS-CoV-2 by FDA under an Emergency Use Authorization (EUA). This EUA will remain in effect (meaning this test can be used) for the duration of the COVID-19 declaration under Section 564(b)(1) of the Act, 21 U.S.C. section 360bbb-3(b)(1), unless the authorization is terminated or revoked.  Performed at [facility], [place] [address] [place], [place] [address]   Culture, blood (routine x 2)     Status: None (Preliminary result)   Collection Time: 12/07/2022  2:28 PM   Specimen: BLOOD  Result Value Ref Range Status   Specimen Description   Final    BLOOD RIGHT  ANTECUBITAL Performed at [facility] [address] [place], [place] [address]    Special Requests   Final    BOTTLES DRAWN AEROBIC AND ANAEROBIC Blood Culture results may not be optimal due to an inadequate volume of blood received in culture bottles Performed at [place] [facility] [address], [place], [place] [address]    Culture   Final    NO GROWTH < 24 HOURS Performed at [facility] [facility] [address] [place], [place] [address]    Report Status PENDING  Incomplete  Culture, blood (routine x 2)     Status: None (Preliminary result)   Collection Time: 12/27/2022  2:33 PM   Specimen: BLOOD  Result Value Ref Range Status   Specimen Description   Final    BLOOD PORTA CATH Performed at [facility] [address] [place], [place] [address]    Special Requests   Final    BOTTLES DRAWN AEROBIC AND ANAEROBIC Blood Culture results may not be  optimal due to an inadequate volume of blood received in culture bottles Performed at [facility] [address], [place], [place] [address]    Culture   Final    NO GROWTH < 24 HOURS Performed at [facility] [address] [place], [place] [address]    Report Status PENDING  Incomplete  MRSA Next Gen by PCR, Nasal     Status: None   Collection Time: 12/02/22 12:47 AM   Specimen: Nasal Mucosa; Nasal Swab  Result Value Ref Range Status   MRSA by PCR Next Gen NOT DETECTED NOT DETECTED Final    Comment: (NOTE) The GeneXpert MRSA Assay (FDA approved for NASAL specimens only), is one component of a comprehensive MRSA colonization surveillance program. It is not intended to diagnose MRSA infection nor to guide or monitor treatment for MRSA infections. Test performance is not FDA approved in patients less than 24 years old. Performed at [facility], [place] [address] [place], [place] [address]          Radiology Studies: CT Angio  Chest PE W/Cm &/Or Wo Cm  Result Date: 12/26/2022 CLINICAL DATA:  Shortness of breath, hypoxia, high clinical suspicion for pulmonary embolism EXAM: CT ANGIOGRAPHY CHEST WITH CONTRAST TECHNIQUE: Multidetector CT imaging of the chest was performed using the standard protocol during bolus administration of intravenous contrast. Multiplanar CT image reconstructions and MIPs were obtained to evaluate the vascular anatomy. RADIATION DOSE REDUCTION: This exam was performed according to the departmental dose-optimization program which includes automated exposure control, adjustment of the mA and/or kV according to patient size and/or use of iterative reconstruction technique. CONTRAST:  72m OMNIPAQUE IOHEXOL 350 MG/ML SOLN COMPARISON:  Chest radiograph done today and CT done on 11/06/2022 FINDINGS: Cardiovascular: There is homogeneous enhancement in thoracic aorta. There is ectasia of ascending thoracic aorta measuring 3.6 cm. There are no intraluminal filling defects in pulmonary artery branches. Coronary artery calcifications are seen. Mediastinum/Nodes: Subcentimeter nodes are seen in mediastinum and hilar regions. Lungs/Pleura: Centrilobular and panlobular emphysema is seen. Blebs and bullae are seen in the apices. In image 53 of series 7, there is 1.4 cm nodular density in right upper lobe. Possible small metallic densities are seen in the medial margin of the lesion. New patchy infiltrates are seen in both lower lobes. There is peribronchial thickening. There is fluid density in the lumen of some of the bronchi. There is no pleural effusion or pneumothorax. Upper Abdomen: There are multiple low-density lesions of varying sizes in the visualized portions of liver largest measuring 4.3 cm in the left lobe consistent with hepatic metastatic disease. Musculoskeletal: There is a proximally 50% decrease in height of body T9 vertebra. Deformities are seen in the right eighth, ninth and tenth ribs, possibly old fractures.  Review of the MIP images confirms the above findings. IMPRESSION: There is 1.4 cm nodular density in right upper lobe which may suggest primary or metastatic malignant neoplasm or focal pneumonia. This finding has not changed significantly. New patchy infiltrates are seen in both lower lobes suggesting multifocal pneumonia. There is no evidence of pulmonary artery embolism. There is no evidence of thoracic aortic dissection. Coronary artery disease. There is compression fracture in the upper endplate of body of T9 vertebra with approximately 50% decrease in height suggesting recent fracture due to trauma or skeletal metastatic disease. If clinically warranted, radionuclide bone scan may be considered. There are multiple space-occupying lesions in liver consistent with hepatic metastatic disease. Electronically Signed  By: Elmer Picker M.D.   On: 12/17/2022 17:23   DG Chest Port 1 View  Result Date: 12/04/2022 CLINICAL DATA:  Shortness of breath. History of metastatic small cell lung cancer. EXAM: PORTABLE CHEST 1 VIEW COMPARISON:  April 07, 2022. FINDINGS: The heart size and mediastinal contours are within normal limits. Right internal jugular Port-A-Cath is unchanged. Tracheostomy tube has been removed. Hyperexpansion of the lungs is noted with probable emphysematous disease. Nodular opacity is seen in right upper lobe consistent with malignancy or metastatic disease. The visualized skeletal structures are unremarkable. IMPRESSION: Hyperexpansion of the lungs is noted with emphysematous disease. Nodular opacity is noted in right upper lobe consistent with malignancy or metastatic disease. Aortic Atherosclerosis (ICD10-I70.0) and Emphysema (ICD10-J43.9). Electronically Signed   By: Marijo Conception M.D.   On: 12/24/2022 14:57   IR PATIENT EVAL TECH 0-60 MINS  Result Date: 12/25/2022 Wolfgang Phoenix     12/01/2022  4:22 PM Please refer to Rowe Robert PA-C note below.  The patient's gtube removal  site was evaluated by Rowe Robert. The dressing was changed. He was escorted to the ED in his wheelchair Pt presented to IR dept today unaccompanied for G tube site check. Hx met small cell lung ca being followed by Dr. Julien Nordmann. Pt reports that current care regimen to G tube incl lidocaine jelly, topical zinc has improved appearance of site so recommended he cont with this regimen for now. If worsening occurs rec f/u with wound care nurse. Pt's vitals today incl BP 118/102, O2 sats in upper 80'S on RA, 90% on 2 liters [place]. HR 130-140. Pt afebrile; denies CP but does have have sl more dyspnea with exertion than usual, occ cough, continued weakness; no N/V or bleeding. Above d/w Dr. Worthy Flank staff and they recommend further evaluation of pt in ED. Pt notified of plans.        Scheduled Meds:  amLODipine  5 mg Oral Daily   enoxaparin (LOVENOX) injection  30 mg Subcutaneous Q24H   fluticasone  2 spray Each Nare Daily   guaiFENesin  1,200 mg Oral BID   levalbuterol  0.63 mg Nebulization TID   loratadine  10 mg Oral Daily   metoprolol tartrate  5 mg Intravenous Once   umeclidinium bromide  1 puff Inhalation Daily   And   mometasone-formoterol  2 puff Inhalation BID   pantoprazole  40 mg Oral BID AC   predniSONE  20 mg Oral Q breakfast   zinc oxide   Topical BID   Continuous Infusions:  sodium chloride 100 mL/hr at 12/02/22 1014   ceFEPime (MAXIPIME) IV Stopped (12/02/22 0631)   vancomycin       LOS: 0 days    Time spent: 40 minutes    Irine Seal, MD [facility]   To contact the attending provider between 7A-7P or the covering provider during after hours 7P-7A, please log into the web site [email or web address] and access using universal [place] password for that web site. If you do not have the password, please call the hospital operator.  12/02/2022, 11:11 AM

## 2022-12-02 NOTE — Assessment & Plan Note (Addendum)
-  Follows with Dr. Inda Merlin with oncology -unfortunately had recurrent and metastasis of his cancer to liver. Currently on 3rd line palliative chemotherapy with poor prognosis. -Seen by oncology 12/03/2022.

## 2022-12-02 NOTE — Assessment & Plan Note (Addendum)
-  T9 vertebrae with 50% decrease in ht seen on CTA chest. Pt reports multiple falls and hitting his back about a week ago which could be the case. No active pain. -Supportive care.

## 2022-12-02 NOTE — Assessment & Plan Note (Addendum)
-  Patient noted overnight to go into acute respiratory distress with increased work of breathing. -ABG done on with acute respiratory acidosis with a pH of 7.26/pCO2 of 70/pO2 of 81. -Patient transferred to ICU and placed on BiPAP. -Discontinue Dulera and place on Pulmicort and Brovana nebs. -Continue scheduled Xopenex and Atrovent nebs. -Continue Claritin, Flonase. -Change oral PPI to IV PPI every 12 hours. -PCCM consulted for further evaluation and management. -Patient subsequently intubated.

## 2022-12-02 NOTE — Assessment & Plan Note (Addendum)
-   Patient initially admitted with acute respiratory failure with hypoxia felt initially secondary to multifocal pneumonia in the setting of metastatic lung cancer on chemotherapy ending of COPD. -Presented with saturation of 70% requiring 2 L via nasal cannula. -Patient initially improved clinically however noted in the morning of 12/04/2022 to go into acute respiratory distress with increased work of breathing, ABG done consistent with acute respiratory acidosis and transferred to the ICU and placed on BiPAP.   -Discontinue Dulera and placed on Pulmicort and Brovana nebs.   -Change oral prednisone to IV Solu-Medrol 125 mg every 6 hours.   -Change oral PPI to IV PPI twice daily.   -Continue IV cefepime.   -Change oral Doxy to IV Doxy.   -Initially placed on BiPAP. -PCCM consulted and patient subsequently intubated this morning 12/04/2022.

## 2022-12-02 NOTE — Assessment & Plan Note (Addendum)
-  stoma from previous G-tube has been non-healing. Has been following IR and just had it checked today. Has been on doxycycline for close to 2 weeks for wound cellulitis and has been improving -currently on IV cefepime and doxycycline for pneumonia which will provide coverage -wound care per RN

## 2022-12-02 NOTE — Assessment & Plan Note (Signed)
S/p indwelling catheter since being evaluated in ED on 11/17/22 for urinary retention -has appt with urology around 12/10/22 for outpatient voiding trial -continue catheter care

## 2022-12-02 NOTE — Assessment & Plan Note (Addendum)
-  Was on amlodipine and Lopressor.

## 2022-12-02 NOTE — ED Notes (Signed)
Pt's oxygen noted to be 83%. RN notified and came to bedside. Nasal cannula was not in both nares so it was repositioned by RN with an increase in O2 sats to 87%.  flow rate increased to 3lpm by RN, which brought sats to 92%.

## 2022-12-03 ENCOUNTER — Other Ambulatory Visit: Payer: Medicare Other

## 2022-12-03 ENCOUNTER — Ambulatory Visit: Payer: Medicare Other

## 2022-12-03 DIAGNOSIS — R338 Other retention of urine: Secondary | ICD-10-CM | POA: Diagnosis not present

## 2022-12-03 DIAGNOSIS — C787 Secondary malignant neoplasm of liver and intrahepatic bile duct: Secondary | ICD-10-CM | POA: Diagnosis not present

## 2022-12-03 DIAGNOSIS — J449 Chronic obstructive pulmonary disease, unspecified: Secondary | ICD-10-CM

## 2022-12-03 DIAGNOSIS — I10 Essential (primary) hypertension: Secondary | ICD-10-CM | POA: Diagnosis not present

## 2022-12-03 DIAGNOSIS — J9601 Acute respiratory failure with hypoxia: Secondary | ICD-10-CM | POA: Diagnosis not present

## 2022-12-03 DIAGNOSIS — R4589 Other symptoms and signs involving emotional state: Secondary | ICD-10-CM

## 2022-12-03 DIAGNOSIS — Z515 Encounter for palliative care: Secondary | ICD-10-CM

## 2022-12-03 DIAGNOSIS — S22078D Other fracture of T9-T10 vertebra, subsequent encounter for fracture with routine healing: Secondary | ICD-10-CM

## 2022-12-03 DIAGNOSIS — Z79899 Other long term (current) drug therapy: Secondary | ICD-10-CM

## 2022-12-03 DIAGNOSIS — R630 Anorexia: Secondary | ICD-10-CM

## 2022-12-03 DIAGNOSIS — Z7189 Other specified counseling: Secondary | ICD-10-CM

## 2022-12-03 DIAGNOSIS — J189 Pneumonia, unspecified organism: Secondary | ICD-10-CM | POA: Diagnosis not present

## 2022-12-03 DIAGNOSIS — R0600 Dyspnea, unspecified: Secondary | ICD-10-CM

## 2022-12-03 LAB — CBC WITH DIFFERENTIAL/PLATELET
Abs Immature Granulocytes: 0.07 10*3/uL (ref 0.00–0.07)
Basophils Absolute: 0 10*3/uL (ref 0.0–0.1)
Basophils Relative: 0 %
Eosinophils Absolute: 0 10*3/uL (ref 0.0–0.5)
Eosinophils Relative: 0 %
HCT: 28.6 % — ABNORMAL LOW (ref 39.0–52.0)
Hemoglobin: 8.9 g/dL — ABNORMAL LOW (ref 13.0–17.0)
Immature Granulocytes: 1 %
Lymphocytes Relative: 4 %
Lymphs Abs: 0.3 10*3/uL — ABNORMAL LOW (ref 0.7–4.0)
MCH: 31.3 pg (ref 26.0–34.0)
MCHC: 31.1 g/dL (ref 30.0–36.0)
MCV: 100.7 fL — ABNORMAL HIGH (ref 80.0–100.0)
Monocytes Absolute: 0.6 10*3/uL (ref 0.1–1.0)
Monocytes Relative: 9 %
Neutro Abs: 5.9 10*3/uL (ref 1.7–7.7)
Neutrophils Relative %: 86 %
Platelets: 156 10*3/uL (ref 150–400)
RBC: 2.84 MIL/uL — ABNORMAL LOW (ref 4.22–5.81)
RDW: 16.9 % — ABNORMAL HIGH (ref 11.5–15.5)
WBC: 6.8 10*3/uL (ref 4.0–10.5)
nRBC: 0 % (ref 0.0–0.2)

## 2022-12-03 LAB — IRON AND TIBC
Iron: 7 ug/dL — ABNORMAL LOW (ref 45–182)
Saturation Ratios: 5 % — ABNORMAL LOW (ref 17.9–39.5)
TIBC: 144 ug/dL — ABNORMAL LOW (ref 250–450)
UIBC: 137 ug/dL

## 2022-12-03 LAB — FOLATE: Folate: 8.8 ng/mL (ref >5.9)

## 2022-12-03 LAB — BASIC METABOLIC PANEL
Anion gap: 11 (ref 5–15)
BUN: 11 mg/dL (ref 8–23)
CO2: 27 mmol/L (ref 22–32)
Calcium: 7.9 mg/dL — ABNORMAL LOW (ref 8.9–10.3)
Chloride: 101 mmol/L (ref 98–111)
Creatinine, Ser: 0.51 mg/dL — ABNORMAL LOW (ref 0.61–1.24)
GFR, Estimated: >60 mL/min (ref >60)
Glucose, Bld: 91 mg/dL (ref 70–99)
Potassium: 3.4 mmol/L — ABNORMAL LOW (ref 3.5–5.1)
Sodium: 139 mmol/L (ref 135–145)

## 2022-12-03 LAB — VITAMIN B12: Vitamin B-12: 690 pg/mL (ref 180–914)

## 2022-12-03 LAB — FERRITIN: Ferritin: 1188 ng/mL — ABNORMAL HIGH (ref 24–336)

## 2022-12-03 LAB — LEGIONELLA PNEUMOPHILA SEROGP 1 UR AG: L. pneumophila Serogp 1 Ur Ag: NEGATIVE

## 2022-12-03 LAB — MAGNESIUM: Magnesium: 1.8 mg/dL (ref 1.7–2.4)

## 2022-12-03 MED ORDER — METOPROLOL TARTRATE 12.5 MG HALF TABLET
12.5000 mg | ORAL_TABLET | Freq: Two times a day (BID) | ORAL | Status: DC
  Administered 2022-12-03 (×2): 12.5 mg via ORAL
  Filled 2022-12-03 (×2): qty 1

## 2022-12-03 MED ORDER — DRONABINOL 2.5 MG PO CAPS
2.5000 mg | ORAL_CAPSULE | Freq: Two times a day (BID) | ORAL | Status: DC
  Administered 2022-12-03: 2.5 mg via ORAL
  Filled 2022-12-03: qty 1

## 2022-12-03 MED ORDER — POTASSIUM CHLORIDE CRYS ER 20 MEQ PO TBCR
40.0000 meq | EXTENDED_RELEASE_TABLET | Freq: Once | ORAL | Status: AC
  Administered 2022-12-03: 40 meq via ORAL
  Filled 2022-12-03: qty 2

## 2022-12-03 MED ORDER — DOXYCYCLINE HYCLATE 100 MG PO TABS
100.0000 mg | ORAL_TABLET | Freq: Two times a day (BID) | ORAL | Status: DC
  Administered 2022-12-03 (×2): 100 mg via ORAL
  Filled 2022-12-03 (×3): qty 1

## 2022-12-03 MED ORDER — MAGNESIUM SULFATE 2 GM/50ML IV SOLN
2.0000 g | Freq: Once | INTRAVENOUS | Status: AC
  Administered 2022-12-03: 2 g via INTRAVENOUS
  Filled 2022-12-03: qty 50

## 2022-12-03 MED ORDER — OXYCODONE HCL 5 MG PO TABS
2.5000 mg | ORAL_TABLET | ORAL | Status: DC | PRN
  Administered 2022-12-03 – 2022-12-04 (×2): 2.5 mg via ORAL
  Filled 2022-12-03 (×2): qty 1

## 2022-12-03 MED ORDER — SODIUM CHLORIDE 0.9% FLUSH
10.0000 mL | INTRAVENOUS | Status: DC | PRN
  Administered 2022-12-05: 30 mL

## 2022-12-03 NOTE — Progress Notes (Signed)
DIAGNOSIS: : Metastatic small cell lung cancer initially diagnosed as Limited stage (T1c, N1, M1a) small cell lung cancer presented with left upper lobe lung nodule as well as left hilar lymphadenopathy as well as right upper lobe lung nodule diagnosed in February 2023    PRIOR THERAPY:  1) Systemic chemotherapy with cisplatin 80 Mg/M2 on day 1 and 2 etoposide 100 Mg/M2 on days 1, 2 and 3 concurrent with radiotherapy.  First dose February 17, 2022.  Status post 4 cycles. Starting from cycle #2 his dose of cisplatin was reduced to 60 Mg/M2 on day 1 and etoposide reduced to 80 Mg/M2 on days 1, 2 and 3 every 3 weeks secondary to intolerance.  Last dose was giving July 14, 2022 discontinued after disease progression.  2) Zepzelca (lurbinectedin) 2.6 Mg/M2 every 3 weeks.  First dose September 07, 2022.  Status post 3 cycles.  This was discontinued secondary to disease progression.   CURRENT THERAPY: Second line systemic chemotherapy with irinotecan 65 Mg/M2 on days 1 and 8 status post day 1 of cycle #1.  Subjective: The patient is seen and examined today.  He was admitted to the hospital with worsening dyspnea on December 01, 2022.  He mentioned that he tolerated the first dose of his systemic chemotherapy fairly well for several days but 5 days later he started feeling more fatigue and weak as well as short of breath.  He presented to the hospital for evaluation and CT angiogram of the chest showed suspicious pneumonia with new patchy infiltrate seen in both lower lobes suggestive of multifocal pneumonia.  He also continues to have the multiple space-occupying lesions in the liver consistent with hepatic metastatic disease. The patient started on treatment with cefepime and doxycycline.  He is feeling little bit better but continues to have hoarseness of his voice.  He also has lack of appetite and currently on supplemental nutrition.  Not having any current fever or chills.  He has no nausea, vomiting,  diarrhea or constipation.  Objective: Vital signs in last 24 hours: Temp:  [97.5 F (36.4 C)-98.7 F (37.1 C)] 97.5 F (36.4 C) (12/07 1412) Pulse Rate:  [99-107] 99 (12/07 1412) Resp:  [16-26] 26 (12/07 1543) BP: (115-134)/(74-80) 115/78 (12/07 1412) SpO2:  [93 %-98 %] 94 % (12/07 1543) FiO2 (%):  [32 %] 32 % (12/07 1543)  Intake/Output from previous day: 12/06 0701 - 12/07 0700 In: 2513 [P.O.:600; I.V.:1513; IV Piggyback:400] Out: 400 [Urine:400] Intake/Output this shift: Total I/O In: -  Out: 575 [Urine:575]  General appearance: alert, appears stated age, fatigued, and no distress Resp: rales LLL and RLL Cardio: regular rate and rhythm, S1, S2 normal, no murmur, click, rub or gallop GI: soft, non-tender; bowel sounds normal; no masses,  no organomegaly Extremities: extremities normal, atraumatic, no cyanosis or edema  Lab Results:  Recent Labs    12/02/22 0540 12/03/22 0629  WBC 10.3 6.8  HGB 10.1* 8.9*  HCT 31.9* 28.6*  PLT 202 156   BMET Recent Labs    12/02/22 0540 12/03/22 0629  NA 136 139  K 3.4* 3.4*  CL 97* 101  CO2 30 27  GLUCOSE 84 91  BUN 13 11  CREATININE 0.56* 0.51*  CALCIUM 8.7* 7.9*    Studies/Results: No results found.  Medications: I have reviewed the patient's current medications.   Assessment/Plan: This is a very pleasant 66 years old white male with extensive stage small cell lung cancer status post systemic chemotherapy with initially cisplatin and etoposide followed  by carboplatin, etoposide and Imfinzi discontinued secondary to disease progression.  The patient was then treated with Zepzelca (lurbinectedin) for 3 cycles but unfortunately he developed disease progression on the second line therapy. The patient started third line chemotherapy with irinotecan 65 Mg/M2 on days 1 and 8 every 3 weeks.  He is status post the first day of the first cycle and tolerated it well except for the fatigue few days later. He is currently being  admitted for pneumonia I had a lengthy discussion with the patient today about his condition and future treatment options. I discussed with the patient again the option of palliative care and hospice but he declined and he would like to continue with additional treatment after discharge. For the pneumonia, will continue with the current antibiotics until improvement of his condition or switch to Levaquin on discharge. I am not sure if the patient will be able to go home after his hospitalization and he may benefit from skilled nursing facility for rehabilitation after discharge from the hospital. I will hold his systemic chemotherapy for now until improvement of his general condition but he still would like to continue with active treatment even with my recommendation and advised to consider palliative care and hospice.  I will arrange a follow-up appointment for him with me at the cancer center 1 week after discharge. Thank you for taking good care of Mr. Dombek.  I will continue to follow-up the patient with you and assist in his management on as-needed basis.  Please call if you have any additional questions. Disclaimer: This note was dictated with voice recognition software. Similar sounding words can inadvertently be transcribed and may be missed upon review.      LOS: 1 day    Eilleen Kempf 12/03/2022

## 2022-12-03 NOTE — TOC Initial Note (Signed)
Transition of Care [facility]) - Initial/Assessment Note    Patient Details  Name: Mark Yu MRN: [id number] Date of Birth: Mar 12, 1956  Transition of [facility]) CM/SW Contact:    Vassie Moselle, LCSW Phone Number: 12/03/2022, 2:47 PM  Clinical Narrative:                 TOC following for discharge recommendations/needs.   Expected Discharge Plan: Home/Self Care Barriers to Discharge: Continued Medical Work up   Patient Goals and CMS Choice   CMS Medicare.gov Compare Post Acute Care list provided to:: Patient Choice offered to / list presented to : Patient  Expected Discharge Plan and Services Expected Discharge Plan: Home/Self Care In-house Referral: NA Discharge Planning Services: NA   Living arrangements for the past 2 months: Single Family Home                                      Prior Living Arrangements/Services Living arrangements for the past 2 months: Single Family Home Lives with:: Significant Other Patient language and need for interpreter reviewed:: Yes Do you feel safe going back to the place where you live?: Yes      Need for Family Participation in Patient Care: No (Comment) Care giver support system in place?: No (comment)   Criminal Activity/Legal Involvement Pertinent to Current Situation/Hospitalization: No - Comment as needed  Activities of Daily Living Home Assistive Devices/Equipment: Cane (specify quad or straight), Dentures (specify type), Eyeglasses, Walker (specify type), Shower chair with back, Hand-held shower hose, Grab bars in shower, Bedside commode/3-in-1 (straight cane upper dentures bedside commode over toilet) ADL Screening (condition at time of admission) Patient's cognitive ability adequate to safely complete daily activities?: Yes Is the patient deaf or have difficulty hearing?: No Does the patient have difficulty seeing, even when wearing glasses/contacts?: No Does the patient have difficulty concentrating,  remembering, or making decisions?: No Patient able to express need for assistance with ADLs?: Yes Does the patient have difficulty dressing or bathing?: No Independently performs ADLs?: No Communication: Independent Dressing (OT): Independent Grooming: Needs assistance Is this a change from baseline?: Pre-admission baseline Feeding: Independent Bathing: Needs assistance Is this a change from baseline?: Pre-admission baseline Toileting: Independent In/Out Bed: Needs assistance Is this a change from baseline?: Pre-admission baseline Walks in Home: Independent with device (comment) Does the patient have difficulty walking or climbing stairs?: Yes Weakness of Legs: Both Weakness of Arms/Hands: Both  Permission Sought/Granted   Permission granted to share information with : No              Emotional Assessment       Orientation: : Oriented to Self, Oriented to Place, Oriented to  Time, Oriented to Situation Alcohol / Substance Use: Alcohol Use Psych Involvement: No (comment)  Admission diagnosis:  Multifocal pneumonia [J18.9] Patient Active Problem List   Diagnosis Date Noted   Wound cellulitis 12/02/2022   Acute urinary retention 12/02/2022   Indwelling Foley catheter present 12/02/2022   Multifocal pneumonia 11/30/2022   T9 vertebral fracture ([place]) 12/19/2022   Metastases to the liver ([place]) 09/08/2022   Pneumonia of right upper lobe due to infectious organism    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   PAF (paroxysmal atrial fibrillation) ([place]) 03/31/2022   Depression    Physical deconditioning    Protein-calorie malnutrition, severe 03/10/2022   Anemia associated with chemotherapy 03/09/2022   Essential hypertension 03/09/2022   Metastasis to lymph  nodes ([place]) 03/09/2022   Acute hypoxic respiratory failure ([place]) 03/08/2022   Port-A-Cath in place 02/25/2022   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 ([place]) 02/09/2022   Encounter for antineoplastic  chemotherapy 02/09/2022   Hilar adenopathy    COPD (chronic obstructive pulmonary disease) ([place]) 01/22/2022   Tobacco use 01/22/2022   Anxiety 01/22/2022   Pulmonary nodules 01/08/2022   Alcohol dependence ([place]) 05/08/2021   Dyspnea 05/08/2021   PCP:  Jennette Dubin, NP Pharmacy:   [facility] [id number] - [address], [place] [address] Phone: [phone] Fax: [phone]     Social Determinants of Health (SDOH) Interventions    Readmission Risk Interventions    12/03/2022    2:46 PM 03/09/2022   12:52 PM  Readmission Risk Prevention Plan  Transportation Screening Complete Complete  PCP or Specialist Appt within 3-5 Days Complete Complete  HRI or [place] Complete Complete  Social Work Consult for [place] Planning/Counseling Complete Complete  Palliative Care Screening Not Applicable   Medication Review [employer]) Complete Complete

## 2022-12-03 NOTE — Progress Notes (Addendum)
RN completed rounding Q 2 hours, Pt resting well SPO2 maintained no less than 96% on 2 L via [place]. Pt denies shortness of breath while resting. Pt continues to have a productive cough, assisted PT with repositioning in bed and PT c/o of shob with small exertion. Pt c/o of lower and mid back pain. PRN traMaDol given for 7/10 pain. RN will reassess pain. Call bell is within reach, Personal items within reach, bed alarm activated and in lowest position. Safety maintained.  IV team consulted to assess R upper chest port and draw labs. Lab tech Joneen Boers is currently at beside and states that IV team will come draw labs once the order is acknowledged.

## 2022-12-03 NOTE — Consult Note (Signed)
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  Progression of patient's cancer has been seen on recent imaging.  During this admission patient found to have acute hypoxic respiratory failure secondary to multifocal pneumonia in the setting of metastatic lung cancer; receiving appropriate management for this.  Patient had recent visit with oncology on 11/26/2022 discussing medical care moving forward to which patient elected to proceed with cancer directed therapies.  Patient seen by palliative medicine team previously.  Palliative medicine team consulted to assist with complex medical decision making and symptom management.  Extensive review of EMR prior to seeing patient.  Presented to bedside and introduced myself and my role as a member of the palliative medicine team to patient.  Patient notes that he recently met a palliative provider from a [facility] palliative his home though he was still confused about what palliative care could provide.  Spent extensive time discussing the role of palliative care and how it has similarities and differences from hospice. Patient voiced appreciation for explaining the benefits of palliative care.   Patient able to update me on his difficult medical journey and the continued conversations he had his had with his oncologist Dr. Earlie Server.  Patient has heard very recently that his prognosis is 3 months or less which is very difficult for  him to process.  Patient notes he wants to "prove Dr. Earlie Server wrong" and outlive this prognosis.  Acknowledged his wishes regarding this and noted that one can be hopeful while also planning for the worst situation.   Spent time getting to know the patient and what his life is like outside of the hospital.  Patient provided me with his business card.  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  This is his passion.  He has been with his significant other Mark Yu for the past 18 years.  He did confirm that should he be unable to make medical decisions for himself he would want his significant other to make decisions for him.  He does not have this in writing so we discussed involving chaplain to complete ACP documentation.  When inquiring about what brings the patient most joy, he noted that it is his job and being radio host.  Then spent time exploring patient's wishes for medical care moving forward.  When discussing matters at length, patient wants to extend his life though quality of life is also incredibly important to him.  Patient noted "hoping to die while DJ'ing on the radio".  Explored that patient's frequent hospitalizations are keeping him from doing the things he enjoys most which is counterintuitive who his wishes for medical care moving  forward.  Patient acknowledged this and noted he has to end up in the hospital due to his symptom burden.  We discussed the idea that symptoms can be managed outside of the hospital, especially with hospice care.  Also spent time addressing patient's CODE STATUS.  Patient noted he would want  interventions "if there was a chance".  Took time to explain that an medical emergency all levels of care will be considered even with the outcome unknown.  Did state that with his known metastatic cancer, if he was sick enough that his heart were to stop or he were to stop breathing, there is concern that performing chest compressions and  intubating the patient will not allow him to return to the quality of life he still enjoys.  Expressed that appropriate medical interventions up until that point could be continued while DNR.  Patient note he had never considered this and will think about it further.  To remain full code at this time.  Also spent time exploring symptom burden.  Patient notes primary symptoms of fatigue, dyspnea, and decreased appetite.  We discussed further interventions directed at his symptom burden.  Patient willing to try medications directed at appetite such as Marinol with lunch and dinner.  Patient had previously been receiving tramadol for pain management though discussed starting medication such as morphine or oxycodone to not only treat pain, though to more focus on his symptom of shortness of breath.  Patient seen actively having increased work of breathing with accessory muscle use present during examination today.  Spent time providing emotional support and getting to know the patient.  All questions answered at that time.  Noted palliative medicine team would continue to follow along to continue discussions.  Primary Diagnoses  Present on Admission:  Multifocal pneumonia  PAF (paroxysmal atrial fibrillation) (HCC)  Metastases to the liver (HCC)  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 (HCC)  COPD (chronic obstructive pulmonary disease) (HCC)  Essential hypertension   Palliative Review of Systems: Fatigue, anorexia, dyspnea  Past Medical History:  Diagnosis Date   Anxiety    tx xanax   COPD (chronic obstructive pulmonary disease) (HCC)    tx inhalers   Depression    Dyspnea    with exertion   HTN (hypertension)    tx amlodipine   Hyponatremia 03/08/2022   Social History   Socioeconomic History   Marital status: Significant Other    Spouse name: Not on file   Number of children: Not on file   Years of education: Not on file   Highest education level: Not on file   Occupational History   Not on file  Tobacco Use   Smoking status: Former    Packs/day: 0.25    Years: 50.00    Total pack years: 12.50    Types: Cigarettes    Quit date: 03/08/2022    Years since quitting: 0.7   Smokeless tobacco: Not on file   Tobacco comments:    Quite 3.5 week agos. 05/15/22 [facility]   Vaping Use   Vaping Use: Never used  Substance and Sexual Activity   Alcohol use: Yes    Alcohol/week: 30.0 standard drinks of alcohol    Types: 30 Cans of beer per week   Drug use: Yes    Types: Marijuana    Comment: Last use in 03/2021   Sexual activity: Yes  Other Topics Concern   Not on file  Social History Narrative   Not on file   Social Determinants of Health   Financial Resource Strain: Not on file  Food Insecurity: No Food Insecurity (12/02/2022)   Hunger Vital Sign    Worried About Running Out of Food in the Last Year: Never true    Ran Out of Food in the Last Year: Never  true  Transportation Needs: No Transportation Needs (12/02/2022)   PRAPARE - Hydrologist (Medical): No    Lack of Transportation (Non-Medical): No  Physical Activity: Not on file  Stress: Not on file  Social Connections: Not on file   Family History  Problem Relation Age of Onset   Alzheimer's disease Mother    Multiple sclerosis Father    Scheduled Meds:  amLODipine  5 mg Oral Daily   Chlorhexidine Gluconate Cloth  6 each Topical Daily   doxycycline  100 mg Oral Q12H   dronabinol  2.5 mg Oral BID AC   enoxaparin (LOVENOX) injection  30 mg Subcutaneous Q24H   fluticasone  2 spray Each Nare Daily   guaiFENesin  1,200 mg Oral BID   ipratropium  0.5 mg Nebulization TID   levalbuterol  0.63 mg Nebulization TID   loratadine  10 mg Oral Daily   metoprolol tartrate  12.5 mg Oral BID   mometasone-formoterol  2 puff Inhalation BID   pantoprazole  40 mg Oral BID AC   predniSONE  20 mg Oral Q breakfast   zinc oxide   Topical BID   Continuous Infusions:  sodium  chloride 100 mL/hr at 12/03/22 0301   ceFEPime (MAXIPIME) IV 2 g (12/03/22 1315)   PRN Meds:.diazepam, ibuprofen, lidocaine, oxyCODONE, prochlorperazine, sodium chloride flush Allergies  Allergen Reactions   Amoxicillin Rash   Macrolides And Ketolides Other (See Comments)    Reaction not recalled   CBC:    Component Value Date/Time   WBC 6.8 12/03/2022 0629   HGB 8.9 (L) 12/03/2022 0629   HGB 10.5 (L) 11/26/2022 1110   HCT 28.6 (L) 12/03/2022 0629   PLT 156 12/03/2022 0629   PLT 224 11/26/2022 1110   MCV 100.7 (H) 12/03/2022 0629   NEUTROABS 5.9 12/03/2022 0629   LYMPHSABS 0.3 (L) 12/03/2022 0629   MONOABS 0.6 12/03/2022 0629   EOSABS 0.0 12/03/2022 0629   BASOSABS 0.0 12/03/2022 0629   Comprehensive Metabolic Panel:    Component Value Date/Time   NA 139 12/03/2022 0629   K 3.4 (L) 12/03/2022 0629   CL 101 12/03/2022 0629   CO2 27 12/03/2022 0629   BUN 11 12/03/2022 0629   CREATININE 0.51 (L) 12/03/2022 0629   CREATININE 0.47 (L) 11/26/2022 1110   GLUCOSE 91 12/03/2022 0629   CALCIUM 7.9 (L) 12/03/2022 0629   AST 20 12/15/2022 1428   AST 15 11/26/2022 1110   ALT 17 12/21/2022 1428   ALT 11 11/26/2022 1110   ALKPHOS 109 12/20/2022 1428   BILITOT 0.8 12/05/2022 1428   BILITOT 0.8 11/26/2022 1110   PROT 7.0 12/27/2022 1428   ALBUMIN 4.0 12/12/2022 1428    Physical Exam: Vital Signs: BP 115/78 (BP Location: Left Arm)   Pulse 99   Temp (!) 97.5 F (36.4 C) (Oral)   Resp (!) 22   Ht 5' 11" (1.803 m)   Wt 47.6 kg   SpO2 93%   BMI 14.64 kg/m  SpO2: SpO2: 93 % O2 Device: O2 Device: Nasal Cannula O2 Flow Rate: O2 Flow Rate (L/min): 3 L/min Intake/output summary:  Intake/Output Summary (Last 24 hours) at 12/03/2022 1515 Last data filed at 12/03/2022 1230 Gross per 24 hour  Intake 1454.62 ml  Output 975 ml  Net 479.62 ml   LBM: Last BM Date : 11/29/2022 Baseline Weight: Weight: 47.6 kg Most recent weight: Weight: 47.6 kg  General: NAD, alert, pleasant,  cachectic Eyes: No drainage noted HENT:  moist mucous membranes Cardiovascular: RRR, no edema in LE b/l Respiratory: Increased work of breathing noted with increased respiratory rate and use of accessory muscles, on O2 via nasal cannula Abdomen: not distended Extremities: Moving all extremities appropriately Skin: Multiple ecchymoses on upper extremities bilaterally, fingers pale due to Raynaud's syndrome Neuro: A&Ox4, following commands easily Psych: appropriately answers all questions          Palliative Performance Scale: 40%           Additional Data Reviewed: Recent Labs    12/02/22 0540 12/03/22 0629  WBC 10.3 6.8  HGB 10.1* 8.9*  PLT 202 156  NA 136 139  BUN 13 11  CREATININE 0.56* 0.51*    Imaging: CT Angio Chest PE W/Cm &/Or Wo Cm CLINICAL DATA:  Shortness of breath, hypoxia, high clinical suspicion for pulmonary embolism  EXAM: CT ANGIOGRAPHY CHEST WITH CONTRAST  TECHNIQUE: Multidetector CT imaging of the chest was performed using the standard protocol during bolus administration of intravenous contrast. Multiplanar CT image reconstructions and MIPs were obtained to evaluate the vascular anatomy.  RADIATION DOSE REDUCTION: This exam was performed according to the departmental dose-optimization program which includes automated exposure control, adjustment of the mA and/or kV according to patient size and/or use of iterative reconstruction technique.  CONTRAST:  44m OMNIPAQUE IOHEXOL 350 MG/ML SOLN  COMPARISON:  Chest radiograph done today and CT done on 11/06/2022  FINDINGS: Cardiovascular: There is homogeneous enhancement in thoracic aorta. There is ectasia of ascending thoracic aorta measuring 3.6 cm. There are no intraluminal filling defects in pulmonary artery branches. Coronary artery calcifications are seen.  Mediastinum/Nodes: Subcentimeter nodes are seen in mediastinum and hilar regions.  Lungs/Pleura: Centrilobular and panlobular  emphysema is seen. Blebs and bullae are seen in the apices. In image 53 of series 7, there is 1.4 cm nodular density in right upper lobe. Possible small metallic densities are seen in the medial margin of the lesion. New patchy infiltrates are seen in both lower lobes. There is peribronchial thickening. There is fluid density in the lumen of some of the bronchi. There is no pleural effusion or pneumothorax.  Upper Abdomen: There are multiple low-density lesions of varying sizes in the visualized portions of liver largest measuring 4.3 cm in the left lobe consistent with hepatic metastatic disease.  Musculoskeletal: There is a proximally 50% decrease in height of body T9 vertebra. Deformities are seen in the right eighth, ninth and tenth ribs, possibly old fractures.  Review of the MIP images confirms the above findings.  IMPRESSION: There is 1.4 cm nodular density in right upper lobe which may suggest primary or metastatic malignant neoplasm or focal pneumonia. This finding has not changed significantly.  New patchy infiltrates are seen in both lower lobes suggesting multifocal pneumonia.  There is no evidence of pulmonary artery embolism. There is no evidence of thoracic aortic dissection. Coronary artery disease.  There is compression fracture in the upper endplate of body of T9 vertebra with approximately 50% decrease in height suggesting recent fracture due to trauma or skeletal metastatic disease. If clinically warranted, radionuclide bone scan may be considered.  There are multiple space-occupying lesions in liver consistent with hepatic metastatic disease.  Electronically Signed   By: PElmer PickerM.D.   On: 12/07/2022 17:23 IR PATIENT EVAL TECH 0-60 MINS CWolfgang Phoenix    12/24/2022  4:22 PM Please refer to KRowe RobertPA-C note below.    The patient's gtube removal site was evaluated by KRowe Robert  The dressing was changed. He was escorted to the  ED in his wheelchair  Pt presented to IR dept today unaccompanied for G tube site  check. Hx met small cell lung ca being followed by Dr. Julien Nordmann.  Pt reports that current care regimen to G tube incl lidocaine  jelly, topical zinc has improved appearance of site so  recommended he cont with this regimen for now. If worsening  occurs rec f/u with wound care nurse. Pt's vitals today incl BP  118/102, O2 sats in upper 80'S on RA, 90% on 2 liters [place]. HR  130-140. Pt afebrile; denies CP but does have have sl more  dyspnea with exertion than usual, occ cough, continued weakness;  no N/V or bleeding. Above d/w Dr. Worthy Flank staff and they  recommend further evaluation of pt in ED. Pt notified of plans.  DG Chest Port 1 View CLINICAL DATA:  Shortness of breath. History of metastatic small cell lung cancer.  EXAM: PORTABLE CHEST 1 VIEW  COMPARISON:  April 07, 2022.  FINDINGS: The heart size and mediastinal contours are within normal limits. Right internal jugular Port-A-Cath is unchanged. Tracheostomy tube has been removed. Hyperexpansion of the lungs is noted with probable emphysematous disease. Nodular opacity is seen in right upper lobe consistent with malignancy or metastatic disease. The visualized skeletal structures are unremarkable.  IMPRESSION: Hyperexpansion of the lungs is noted with emphysematous disease. Nodular opacity is noted in right upper lobe consistent with malignancy or metastatic disease.  Aortic Atherosclerosis (ICD10-I70.0) and Emphysema (ICD10-J43.9).  Electronically Signed   By: Marijo Conception M.D.   On: 12/22/2022 14:57    I personally reviewed recent imaging.   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  Progression of patient's cancer has been seen on recent imaging.  During this admission patient found to have acute hypoxic respiratory failure secondary to multifocal pneumonia in the setting of metastatic lung cancer; receiving appropriate management for this.  Patient had recent visit with oncology on 11/26/2022 discussing medical care moving forward to which patient elected to proceed with cancer directed therapies.  Patient seen by palliative medicine team previously.  Palliative medicine team consulted to assist with complex medical decision making and symptom management.  # Complex medical decision making/goals of care  -Extensive discussions with patient as described above in HPI.  Discussed goals for medical care moving forward.  Patient hopeful for life extension though also notes quality of life is incredibly important to him.  We discussed trying to balance wishes for symptom management and quality time moving forward.  Discussed similarities and differences between hospice and palliative care.  Noted would continue to follow along to discuss medical care moving forward.  -Patient noted that if he was unable to make medical decisions for himself he would want his significant other, Elyse Hsu, make medical decisions for him.  Patient does not have documentation noting this and does have a child.  Noted would involve chaplain to complete ACP documentation which patient is wanting to do at this time.  -  Code Status: Full Code    -Did address patient's CODE STATUS.  Discussed concern that in setting of metastatic cancer if patient was sick enough  that his heart were to stop or he were to stop breathing, chest compressions and intubation would not reverse patient's underlying disease process and would likely not return him to the quality of life he still values.  Encouraged patient to further consider CODE STATUS changed  to DNR/DNI and that all appropriate medical care could be continued moving forward up until the point of death.  At this time patient will remain full code.  # Symptom management  -Dyspnea/pain, in the setting of metastatic lung cancer   -Start oxycodone 2.5 mg every 4 hours as needed   -Discontinue tramadol   -Anorexia, in the setting of metastatic lung cancer   -Start Marinol 2.5 mg AC lunch and dinner   -Consider olanzapine instead of Marinol though EKG from 12/22/2022 noted QTc of 489 so we will hold off on QTc prolonging medication at this time.  # Psycho-social/Spiritual Support:  - Support System: Significant other of 18 years  # Discharge Planning: TBD  Thank you for allowing the palliative care team to participate in the care Faustino Congress.  Chelsea Aus, DO Palliative Care Provider PMT # [phone]  If patient remains symptomatic despite maximum doses, please call PMT at [phone] between 0700 and 1900. Outside of these hours, please call attending, as PMT does not have night coverage.

## 2022-12-03 NOTE — Progress Notes (Signed)
PT Cancellation Note  Patient Details Name: Elisandro Jarrett MRN: [id number] DOB: 02-18-1956   Cancelled Treatment:    Reason Eval/Treat Not Completed: Other (comment);Patient at procedure or test/unavailable (pt meeting with palliative care. Will follow.)  Philomena Doheny PT 12/03/2022  Acute Rehabilitation Services  Office [phone]

## 2022-12-03 NOTE — Progress Notes (Signed)
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  Patient noted to have had recent progression of lung cancer to his liver since then has been evaluated in the ED for urinary retention discharged with indwelling Foley 11/21, given Doxy for possible cellulitis around G-tube stoma, 1128 and increased leakage through his G-tube stoma IR consulted recommendation to allow GI site to heal with zinc and lidocaine.  Patient presented to IR on day of admission with G-tube stoma check and noted to be hypoxic with sats of 80% on room air requiring 2 L nasal cannula and sent to the ED.  Patient noted to have had complaints of shortness of breath and cough x4 days.  Patient seen in the ED CT angiogram chest done concerning for patchy infiltrate in both lower lobes suggesting multifocal pneumonia.  Patient admitted and placed empirically on IV antibiotics.   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 -Per admitting physician patient  noted to have sats of 70% requiring 2 L nasal cannula. -Urine strep pneumococcus antigen negative. -Urine Legionella antigen negative.. -COVID-19 PCR negative. -Influenza A and B PCR negative. -MRSA PCR negative. -Blood cultures pending. -Patient on 3 L nasal cannula with sats ranging from 94 to 97%. -Clinical improvement. -Continue Mucinex 1200 mg twice daily, Flonase, PPI, Atrovent and Xopenex nebs, Claritin, Dulera, prednisone. -Continue prednisone. -Supportive care.  2.  COPD -Due to problem #1, placed on scheduled Atrovent and Xopenex nebs. -Continue prednisone, Mucinex, Flonase, PPI.  3.  Small cell carcinoma of the upper lobe of the left lung -Patient noted to have had recurrent and metastatic disease of his cancer to the liver, currently on third line palliative chemotherapy with a poor prognosis. -Being followed by Dr. Julien Nordmann of oncology. -Oncology notified via epic of patient's admission.   -Outpatient follow-up.  4.  Acute urinary retention -Status post indwelling Foley catheter she has been evaluated in the ED 11/17/2022 for urinary retention. -Patient noted to have an outpatient appointment with urology 12/10/2022 for voiding trial and further evaluation.  5.  Wound cellulitis -Stoma from previous G-tube noted to be nonhealing, being followed by IR, had G-tube check on day of admission, noted to have been on doxycycline for close to 2 weeks for wound cellulitis which has been improving. -Currently on IV vancomycin, cefepime for pneumonia which should provide coverage. -Discontinue IV Vanco and place on doxycycline. -Continue wound care per RN.  6.  T9 vertebral fracture -T9 vertebrae with 50% decrease in height seen on CTA chest. -Per admitting physician patient reported multiple falls and hitting his  back about 1 week prior to admission. -Denies any significant pain at this time.  7.  Paroxysmal atrial fibrillation -Currently in sinus tachycardia. -Not on rate  controlling medication or anticoagulation. -IV Lopressor 5 mg x 1 given in the ED on admission.. -Patient states heart rate usually in the low 100s. -Start Lopressor 12.5 mg twice daily.  8.  Hypertension -Continue Norvasc.   -Start low-dose Lopressor.     DVT prophylaxis: Lovenox Code Status: Full Family Communication: Updated patient.  Updated girlfriend at bedside.  Disposition: Likely home when clinically improved.  Status is: Inpatient The patient will require care spanning > 2 midnights and should be moved to inpatient because: Severity of illness   Consultants:  Oncology notified via epic of patient admission.  Procedures:  CT angiogram chest 11/27/2022 Chest x-ray 11/27/2022  Antimicrobials:  IV cefepime 11/28/2022>>>> IV vancomycin 12/19/2022>>>>> 12/03/2022 Doxycycline 12/03/2022>>>>   Subjective: Sitting up in bed.  Girlfriend at bedside.  Feels breathing is slowly improved.  Feels some improvement with cough.  Complaining of significant weakness diffusely.  Girlfriend stating patient was supposed to have chemotherapy today and was wondering whether patient will receive it..   Objective: Vitals:   12/02/22 2228 12/02/22 2246 12/03/22 0229 12/03/22 0853  BP: 124/79 128/78 134/80   Pulse: 100 100 (!) 104   Resp: (!) _0 (!) 26  Temp: 97.6 F (36.4 C) 98.2 F (36.8 C) 98.7 F (37.1 C)   TempSrc: Axillary  Oral   SpO2: 96% 98% 96% 96%  Weight:      Height:        Intake/Output Summary (Last 24 hours) at 12/03/2022 1114 Last data filed at 12/03/2022 0301 Gross per 24 hour  Intake 2513.01 ml  Output 400 ml  Net 2113.01 ml    Filed Weights   12/02/22 1447  Weight: 47.6 kg    Examination:  General exam: NAD. Respiratory system: Improved air movement.  No significant crackles.  No rhonchi.  Fair air movement.  Cardiovascular system: Tachycardia.  No JVD, no murmurs rubs or gallops.  No lower extremity edema. Gastrointestinal system: Abdomen is soft,  nontender, nondistended, positive bowel sounds.  G-tube covered by bandage.   Central nervous system: Alert and oriented. No focal neurological deficits. Extremities: Symmetric 5 x 5 power. Skin: No rashes, lesions or ulcers Psychiatry: Judgement and insight appear normal. Mood & affect appropriate.     Data Reviewed: I have personally reviewed following labs and imaging studies  CBC: Recent Labs  Lab 12/11/2022 1428 12/02/22 0540 12/03/22 0629  WBC 13.6* 10.3 6.8  NEUTROABS 12.8*  --  5.9  HGB 11.2* 10.1* 8.9*  HCT 35.6* 31.9* 28.6*  MCV 100.3* 100.3* 100.7*  PLT 211 202 156     Basic Metabolic Panel: Recent Labs  Lab 12/07/2022 1428 12/02/22 0540 12/03/22 0629  NA 138 136 139  K 3.7 3.4* 3.4*  CL 99 97* 101  CO2 _1 GLUCOSE 112* 84 91  BUN _2 CREATININE 0.69 0.56* 0.51*  CALCIUM 9.2 8.7* 7.9*  MG  --  2.1 1.8     GFR: Estimated Creatinine Clearance: 62 mL/min (A) (by C-G formula based on SCr of 0.51 mg/dL (L)).  Liver Function Tests: Recent Labs  Lab 11/30/2022 1428  AST 20  ALT 17  ALKPHOS 109  BILITOT 0.8  PROT 7.0  ALBUMIN 4.0     CBG: Recent Labs  Lab 12/23/2022 1433  GLUCAP 124*  Recent Results (from the past 240 hour(s))  Resp Panel by RT-PCR (Flu A&B, Covid) Anterior Nasal Swab     Status: None   Collection Time: 12/12/2022  2:28 PM   Specimen: Anterior Nasal Swab  Result Value Ref Range Status   SARS Coronavirus 2 by RT PCR NEGATIVE NEGATIVE Final    Comment: (NOTE) SARS-CoV-2 target nucleic acids are NOT DETECTED.  The SARS-CoV-2 RNA is generally detectable in upper respiratory specimens during the acute phase of infection. The lowest concentration of SARS-CoV-2 viral copies this assay can detect is 138 copies/mL. A negative result does not preclude SARS-Cov-2 infection and should not be used as the sole basis for treatment or other patient management decisions. A negative result may occur with  improper specimen  collection/handling, submission of specimen other than nasopharyngeal swab, presence of viral mutation(s) within the areas targeted by this assay, and inadequate number of viral copies(<138 copies/mL). A negative result must be combined with clinical observations, patient history, and epidemiological information. The expected result is Negative.  Fact Sheet for Patients:  EntrepreneurPulse.com.au  Fact Sheet for Healthcare Providers:  IncredibleEmployment.be  This test is no t yet approved or cleared by the [place] FDA and  has been authorized for detection and/or diagnosis of SARS-CoV-2 by FDA under an Emergency Use Authorization (EUA). This EUA will remain  in effect (meaning this test can be used) for the duration of the COVID-19 declaration under Section 564(b)(1) of the Act, 21 U.S.C.section 360bbb-3(b)(1), unless the authorization is terminated  or revoked sooner.       Influenza A by PCR NEGATIVE NEGATIVE Final   Influenza B by PCR NEGATIVE NEGATIVE Final    Comment: (NOTE) The Xpert Xpress SARS-CoV-2/FLU/RSV plus assay is intended as an aid in the diagnosis of influenza from Nasopharyngeal swab specimens and should not be used as a sole basis for treatment. Nasal washings and aspirates are unacceptable for Xpert Xpress SARS-CoV-2/FLU/RSV testing.  Fact Sheet for Patients: EntrepreneurPulse.com.au  Fact Sheet for Healthcare Providers: IncredibleEmployment.be  This test is not yet approved or cleared by the [place] FDA and has been authorized for detection and/or diagnosis of SARS-CoV-2 by FDA under an Emergency Use Authorization (EUA). This EUA will remain in effect (meaning this test can be used) for the duration of the COVID-19 declaration under Section 564(b)(1) of the Act, 21 U.S.C. section 360bbb-3(b)(1), unless the authorization is terminated or revoked.  Performed at [facility], [place] [address], [place], [place] [address]   Culture, blood (routine x 2)     Status: None (Preliminary result)   Collection Time: 12/02/2022  2:28 PM   Specimen: BLOOD  Result Value Ref Range Status   Specimen Description   Final    BLOOD RIGHT ANTECUBITAL Performed at [facility] [address] [place], [place] [address]    Special Requests   Final    BOTTLES DRAWN AEROBIC AND ANAEROBIC Blood Culture results may not be optimal due to an inadequate volume of blood received in culture bottles Performed at [facility] [address] [place], [place] [address]    Culture   Final    NO GROWTH 2 DAYS Performed at [facility] [address] [place], [place] [address]    Report Status PENDING  Incomplete  Culture, blood (routine x 2)     Status: None (Preliminary result)   Collection Time: 12/11/2022  2:33 PM   Specimen: BLOOD  Result Value Ref  Range Status   Specimen Description   Final    BLOOD PORTA CATH Performed at [facility] [address], [place], [place] [address]    Special Requests   Final    BOTTLES DRAWN AEROBIC AND ANAEROBIC Blood Culture results may not be optimal due to an inadequate volume of blood received in culture bottles Performed at [facility] [address], [place], [place] [address]    Culture   Final    NO GROWTH 2 DAYS Performed at [facility] [address] [place], [place] [address]    Report Status PENDING  Incomplete  MRSA Next Gen by PCR, Nasal     Status: None   Collection Time: 12/02/22 12:47 AM   Specimen: Nasal Mucosa; Nasal Swab  Result Value Ref Range Status   MRSA by PCR Next Gen NOT DETECTED NOT DETECTED Final    Comment: (NOTE) The GeneXpert MRSA Assay (FDA approved for NASAL specimens only), is one component of a comprehensive MRSA colonization surveillance program. It is not intended to diagnose MRSA  infection nor to guide or monitor treatment for MRSA infections. Test performance is not FDA approved in patients less than 61 years old. Performed at [facility], [place] [address] [place],  [address]          Radiology Studies: CT Angio Chest PE W/Cm &/Or Wo Cm  Result Date: 11/30/2022 CLINICAL DATA:  Shortness of breath, hypoxia, high clinical suspicion for pulmonary embolism EXAM: CT ANGIOGRAPHY CHEST WITH CONTRAST TECHNIQUE: Multidetector CT imaging of the chest was performed using the standard protocol during bolus administration of intravenous contrast. Multiplanar CT image reconstructions and MIPs were obtained to evaluate the vascular anatomy. RADIATION DOSE REDUCTION: This exam was performed according to the departmental dose-optimization program which includes automated exposure control, adjustment of the mA and/or kV according to patient size and/or use of iterative reconstruction technique. CONTRAST:  65m OMNIPAQUE IOHEXOL 350 MG/ML SOLN COMPARISON:  Chest radiograph done today and CT done on 11/06/2022 FINDINGS: Cardiovascular: There is homogeneous enhancement in thoracic aorta. There is ectasia of ascending thoracic aorta measuring 3.6 cm. There are no intraluminal filling defects in pulmonary artery branches. Coronary artery calcifications are seen. Mediastinum/Nodes: Subcentimeter nodes are seen in mediastinum and hilar regions. Lungs/Pleura: Centrilobular and panlobular emphysema is seen. Blebs and bullae are seen in the apices. In image 53 of series 7, there is 1.4 cm nodular density in right upper lobe. Possible small metallic densities are seen in the medial margin of the lesion. New patchy infiltrates are seen in both lower lobes. There is peribronchial thickening. There is fluid density in the lumen of some of the bronchi. There is no pleural effusion or pneumothorax. Upper Abdomen: There are multiple low-density lesions of varying sizes in the  visualized portions of liver largest measuring 4.3 cm in the left lobe consistent with hepatic metastatic disease. Musculoskeletal: There is a proximally 50% decrease in height of body T9 vertebra. Deformities are seen in the right eighth, ninth and tenth ribs, possibly old fractures. Review of the MIP images confirms the above findings. IMPRESSION: There is 1.4 cm nodular density in right upper lobe which may suggest primary or metastatic malignant neoplasm or focal pneumonia. This finding has not changed significantly. New patchy infiltrates are seen in both lower lobes suggesting multifocal pneumonia. There is no evidence of pulmonary artery embolism. There is no evidence of thoracic aortic dissection. Coronary artery disease. There is compression fracture  in the upper endplate of body of T9 vertebra with approximately 50% decrease in height suggesting recent fracture due to trauma or skeletal metastatic disease. If clinically warranted, radionuclide bone scan may be considered. There are multiple space-occupying lesions in liver consistent with hepatic metastatic disease. Electronically Signed   By: Elmer Picker M.D.   On: 12/10/2022 17:23   DG Chest Port 1 View  Result Date: 12/14/2022 CLINICAL DATA:  Shortness of breath. History of metastatic small cell lung cancer. EXAM: PORTABLE CHEST 1 VIEW COMPARISON:  April 07, 2022. FINDINGS: The heart size and mediastinal contours are within normal limits. Right internal jugular Port-A-Cath is unchanged. Tracheostomy tube has been removed. Hyperexpansion of the lungs is noted with probable emphysematous disease. Nodular opacity is seen in right upper lobe consistent with malignancy or metastatic disease. The visualized skeletal structures are unremarkable. IMPRESSION: Hyperexpansion of the lungs is noted with emphysematous disease. Nodular opacity is noted in right upper lobe consistent with malignancy or metastatic disease. Aortic Atherosclerosis  (ICD10-I70.0) and Emphysema (ICD10-J43.9). Electronically Signed   By: Marijo Conception M.D.   On: 11/30/2022 14:57   IR PATIENT EVAL TECH 0-60 MINS  Result Date: 12/13/2022 Wolfgang [place]     12/10/2022  4:22 PM Please refer to Rowe Robert PA-C note below.  The patient's gtube removal site was evaluated by Rowe Robert. The dressing was changed. He was escorted to the ED in his wheelchair Pt presented to IR dept today unaccompanied for G tube site check. Hx met small cell lung ca being followed by Dr. Julien Nordmann. Pt reports that current care regimen to G tube incl lidocaine jelly, topical zinc has improved appearance of site so recommended he cont with this regimen for now. If worsening occurs rec f/u with wound care nurse. Pt's vitals today incl BP 118/102, O2 sats in upper 80'S on RA, 90% on 2 liters [place]. HR 130-140. Pt afebrile; denies CP but does have have sl more dyspnea with exertion than usual, occ cough, continued weakness; no N/V or bleeding. Above d/w Dr. Worthy Flank staff and they recommend further evaluation of pt in ED. Pt notified of plans.        Scheduled Meds:  amLODipine  5 mg Oral Daily   Chlorhexidine Gluconate Cloth  6 each Topical Daily   doxycycline  100 mg Oral Q12H   enoxaparin (LOVENOX) injection  30 mg Subcutaneous Q24H   fluticasone  2 spray Each Nare Daily   guaiFENesin  1,200 mg Oral BID   ipratropium  0.5 mg Nebulization TID   levalbuterol  0.63 mg Nebulization TID   loratadine  10 mg Oral Daily   metoprolol tartrate  12.5 mg Oral BID   mometasone-formoterol  2 puff Inhalation BID   pantoprazole  40 mg Oral BID AC   predniSONE  20 mg Oral Q breakfast   zinc oxide   Topical BID   Continuous Infusions:  sodium chloride 100 mL/hr at 12/03/22 0301   ceFEPime (MAXIPIME) IV 2 g (12/03/22 0403)     LOS: 1 day    Time spent: 40 minutes    Irine Seal, MD [facility]   To contact the attending provider between 7A-7P or the covering provider  during after hours 7P-7A, please log into the web site [email or web address] and access using universal [place] password for that web site. If you do not have the password, please call the hospital operator.  12/03/2022, 11:14 AM

## 2022-12-04 ENCOUNTER — Inpatient Hospital Stay (HOSPITAL_COMMUNITY): Payer: Medicare Other

## 2022-12-04 DIAGNOSIS — C3412 Malignant neoplasm of upper lobe, left bronchus or lung: Secondary | ICD-10-CM | POA: Diagnosis not present

## 2022-12-04 DIAGNOSIS — Z515 Encounter for palliative care: Secondary | ICD-10-CM | POA: Diagnosis not present

## 2022-12-04 DIAGNOSIS — J441 Chronic obstructive pulmonary disease with (acute) exacerbation: Secondary | ICD-10-CM | POA: Diagnosis not present

## 2022-12-04 DIAGNOSIS — J189 Pneumonia, unspecified organism: Secondary | ICD-10-CM | POA: Diagnosis not present

## 2022-12-04 DIAGNOSIS — Z66 Do not resuscitate: Secondary | ICD-10-CM

## 2022-12-04 DIAGNOSIS — J9601 Acute respiratory failure with hypoxia: Secondary | ICD-10-CM | POA: Diagnosis not present

## 2022-12-04 LAB — GLUCOSE, CAPILLARY
Glucose-Capillary: 155 mg/dL — ABNORMAL HIGH (ref 70–99)
Glucose-Capillary: 158 mg/dL — ABNORMAL HIGH (ref 70–99)
Glucose-Capillary: 192 mg/dL — ABNORMAL HIGH (ref 70–99)
Glucose-Capillary: 156 mg/dL — ABNORMAL HIGH (ref 70–99)

## 2022-12-04 LAB — CBC WITH DIFFERENTIAL/PLATELET
Abs Immature Granulocytes: 0.14 10*3/uL — ABNORMAL HIGH (ref 0.00–0.07)
Basophils Absolute: 0 10*3/uL (ref 0.0–0.1)
Basophils Relative: 0 %
Eosinophils Absolute: 0 10*3/uL (ref 0.0–0.5)
Eosinophils Relative: 0 %
HCT: 32 % — ABNORMAL LOW (ref 39.0–52.0)
Hemoglobin: 9.8 g/dL — ABNORMAL LOW (ref 13.0–17.0)
Immature Granulocytes: 1 %
Lymphocytes Relative: 1 %
Lymphs Abs: 0.1 10*3/uL — ABNORMAL LOW (ref 0.7–4.0)
MCH: 31 pg (ref 26.0–34.0)
MCHC: 30.6 g/dL (ref 30.0–36.0)
MCV: 101.3 fL — ABNORMAL HIGH (ref 80.0–100.0)
Monocytes Absolute: 0.6 10*3/uL (ref 0.1–1.0)
Monocytes Relative: 4 %
Neutro Abs: 13.4 10*3/uL — ABNORMAL HIGH (ref 1.7–7.7)
Neutrophils Relative %: 94 %
Platelets: 225 10*3/uL (ref 150–400)
RBC: 3.16 MIL/uL — ABNORMAL LOW (ref 4.22–5.81)
RDW: 16.2 % — ABNORMAL HIGH (ref 11.5–15.5)
WBC: 14.2 10*3/uL — ABNORMAL HIGH (ref 4.0–10.5)
nRBC: 0 % (ref 0.0–0.2)

## 2022-12-04 LAB — BASIC METABOLIC PANEL
Anion gap: 15 (ref 5–15)
BUN: 26 mg/dL — ABNORMAL HIGH (ref 8–23)
CO2: 23 mmol/L (ref 22–32)
Calcium: 8.5 mg/dL — ABNORMAL LOW (ref 8.9–10.3)
Chloride: 102 mmol/L (ref 98–111)
Creatinine, Ser: 0.86 mg/dL (ref 0.61–1.24)
GFR, Estimated: >60 mL/min (ref >60)
Glucose, Bld: 113 mg/dL — ABNORMAL HIGH (ref 70–99)
Potassium: 5 mmol/L (ref 3.5–5.1)
Sodium: 140 mmol/L (ref 135–145)

## 2022-12-04 LAB — BLOOD GAS, VENOUS
Acid-base deficit: 0.2 mmol/L (ref 0.0–2.0)
Bicarbonate: 26.4 mmol/L (ref 20.0–28.0)
O2 Saturation: 63.8 %
Patient temperature: 37
pCO2, Ven: 50 mmHg (ref 44–60)
pH, Ven: 7.33 (ref 7.25–7.43)
pO2, Ven: 38 mmHg (ref 32–45)

## 2022-12-04 LAB — BLOOD GAS, ARTERIAL
Acid-Base Excess: 1.8 mmol/L (ref 0.0–2.0)
Bicarbonate: 31.1 mmol/L — ABNORMAL HIGH (ref 20.0–28.0)
Drawn by: 59996
O2 Saturation: 98 %
Patient temperature: 36.6
pCO2 arterial: 70 mmHg (ref 32–48)
pH, Arterial: 7.26 — ABNORMAL LOW (ref 7.35–7.45)
pO2, Arterial: 81 mmHg — ABNORMAL LOW (ref 83–108)

## 2022-12-04 LAB — MAGNESIUM: Magnesium: 3.2 mg/dL — ABNORMAL HIGH (ref 1.7–2.4)

## 2022-12-04 LAB — D-DIMER, QUANTITATIVE: D-Dimer, Quant: 1.01 ug/mL-FEU — ABNORMAL HIGH (ref 0.00–0.50)

## 2022-12-04 MED ORDER — NOREPINEPHRINE 4 MG/250ML-% IV SOLN
0.0000 ug/min | INTRAVENOUS | Status: DC
  Administered 2022-12-04: 10 ug/min via INTRAVENOUS
  Administered 2022-12-04: 9 ug/min via INTRAVENOUS
  Administered 2022-12-05: 4 ug/min via INTRAVENOUS
  Filled 2022-12-04 (×2): qty 250

## 2022-12-04 MED ORDER — METOPROLOL TARTRATE 5 MG/5ML IV SOLN
2.5000 mg | Freq: Once | INTRAVENOUS | Status: AC
  Administered 2022-12-04: 2.5 mg via INTRAVENOUS
  Filled 2022-12-04: qty 5

## 2022-12-04 MED ORDER — DOCUSATE SODIUM 50 MG/5ML PO LIQD
100.0000 mg | Freq: Two times a day (BID) | ORAL | Status: DC
  Administered 2022-12-04: 100 mg
  Filled 2022-12-04: qty 10

## 2022-12-04 MED ORDER — ETOMIDATE 2 MG/ML IV SOLN
INTRAVENOUS | Status: AC
  Administered 2022-12-04: 10 mg
  Filled 2022-12-04: qty 20

## 2022-12-04 MED ORDER — ROCURONIUM BROMIDE 10 MG/ML (PF) SYRINGE
PREFILLED_SYRINGE | INTRAVENOUS | Status: AC
  Administered 2022-12-04: 50 mg
  Filled 2022-12-04: qty 10

## 2022-12-04 MED ORDER — FENTANYL CITRATE PF 50 MCG/ML IJ SOSY
PREFILLED_SYRINGE | INTRAMUSCULAR | Status: AC
  Administered 2022-12-04: 50 ug
  Filled 2022-12-04: qty 2

## 2022-12-04 MED ORDER — MIDAZOLAM HCL 2 MG/2ML IJ SOLN
INTRAMUSCULAR | Status: AC
  Administered 2022-12-04: 1 mg
  Filled 2022-12-04: qty 2

## 2022-12-04 MED ORDER — PROPOFOL 1000 MG/100ML IV EMUL
0.0000 ug/kg/min | INTRAVENOUS | Status: DC
  Administered 2022-12-04: 5 ug/kg/min via INTRAVENOUS
  Administered 2022-12-04 – 2022-12-05 (×2): 25 ug/kg/min via INTRAVENOUS
  Administered 2022-12-05 – 2022-12-06 (×2): 35 ug/kg/min via INTRAVENOUS
  Filled 2022-12-04 (×5): qty 100

## 2022-12-04 MED ORDER — ORAL CARE MOUTH RINSE: 15.0000 mL | OROMUCOSAL | Status: DC | PRN

## 2022-12-04 MED ORDER — PHENYLEPHRINE 80 MCG/ML (10ML) SYRINGE FOR IV PUSH (FOR BLOOD PRESSURE SUPPORT)
PREFILLED_SYRINGE | INTRAVENOUS | Status: AC
  Administered 2022-12-04: 160 ug
  Filled 2022-12-04: qty 10

## 2022-12-04 MED ORDER — BUDESONIDE 0.5 MG/2ML IN SUSP
0.5000 mg | Freq: Two times a day (BID) | RESPIRATORY_TRACT | Status: DC
  Administered 2022-12-04 – 2022-12-06 (×5): 0.5 mg via RESPIRATORY_TRACT
  Filled 2022-12-04 (×5): qty 2

## 2022-12-04 MED ORDER — LORATADINE 10 MG PO TABS: 10.0000 mg | ORAL_TABLET | Freq: Every day | ORAL | Status: DC

## 2022-12-04 MED ORDER — METOPROLOL TARTRATE 5 MG/5ML IV SOLN: 5.0000 mg | Freq: Three times a day (TID) | INTRAVENOUS | Status: DC

## 2022-12-04 MED ORDER — FENTANYL CITRATE PF 50 MCG/ML IJ SOSY: 25.0000 ug | PREFILLED_SYRINGE | INTRAMUSCULAR | Status: DC | PRN

## 2022-12-04 MED ORDER — LORATADINE 10 MG PO TABS
10.0000 mg | ORAL_TABLET | Freq: Every day | ORAL | Status: DC
  Administered 2022-12-04: 10 mg
  Filled 2022-12-04: qty 1

## 2022-12-04 MED ORDER — ARFORMOTEROL TARTRATE 15 MCG/2ML IN NEBU
15.0000 ug | INHALATION_SOLUTION | Freq: Two times a day (BID) | RESPIRATORY_TRACT | Status: DC
  Administered 2022-12-04 – 2022-12-06 (×5): 15 ug via RESPIRATORY_TRACT
  Filled 2022-12-04 (×5): qty 2

## 2022-12-04 MED ORDER — FENTANYL CITRATE (PF) 100 MCG/2ML IJ SOLN
INTRAMUSCULAR | Status: AC
  Filled 2022-12-04: qty 2

## 2022-12-04 MED ORDER — SODIUM CHLORIDE 0.9 % IV SOLN
100.0000 mg | Freq: Two times a day (BID) | INTRAVENOUS | Status: DC
  Administered 2022-12-04 – 2022-12-06 (×5): 100 mg via INTRAVENOUS
  Filled 2022-12-04 (×5): qty 100

## 2022-12-04 MED ORDER — IPRATROPIUM-ALBUTEROL 0.5-2.5 (3) MG/3ML IN SOLN
3.0000 mL | RESPIRATORY_TRACT | Status: AC
  Administered 2022-12-04: 3 mL via RESPIRATORY_TRACT
  Filled 2022-12-04: qty 3

## 2022-12-04 MED ORDER — METHYLPREDNISOLONE SODIUM SUCC 40 MG IJ SOLR
40.0000 mg | Freq: Every day | INTRAMUSCULAR | Status: DC
  Administered 2022-12-04 – 2022-12-06 (×3): 40 mg via INTRAVENOUS
  Filled 2022-12-04 (×3): qty 1

## 2022-12-04 MED ORDER — LEVALBUTEROL HCL 0.63 MG/3ML IN NEBU: 0.6300 mg | INHALATION_SOLUTION | Freq: Four times a day (QID) | RESPIRATORY_TRACT | Status: DC | PRN

## 2022-12-04 MED ORDER — METHYLPREDNISOLONE SODIUM SUCC 40 MG IJ SOLR
40.0000 mg | INTRAMUSCULAR | Status: AC
  Administered 2022-12-04: 40 mg via INTRAVENOUS
  Filled 2022-12-04: qty 1

## 2022-12-04 MED ORDER — ORAL CARE MOUTH RINSE
15.0000 mL | OROMUCOSAL | Status: DC
  Administered 2022-12-05 – 2022-12-06 (×18): 15 mL via OROMUCOSAL

## 2022-12-04 MED ORDER — MAGNESIUM SULFATE 2 GM/50ML IV SOLN
2.0000 g | Freq: Once | INTRAVENOUS | Status: AC
  Administered 2022-12-04: 2 g via INTRAVENOUS

## 2022-12-04 MED ORDER — GUAIFENESIN 100 MG/5ML PO LIQD
15.0000 mL | Freq: Four times a day (QID) | ORAL | Status: DC
  Administered 2022-12-04: 15 mL
  Filled 2022-12-04: qty 20

## 2022-12-04 MED ORDER — NOREPINEPHRINE 4 MG/250ML-% IV SOLN
INTRAVENOUS | Status: AC
  Administered 2022-12-04: 4 mg
  Filled 2022-12-04: qty 250

## 2022-12-04 MED ORDER — POLYETHYLENE GLYCOL 3350 17 G PO PACK
17.0000 g | PACK | Freq: Every day | ORAL | Status: DC
  Administered 2022-12-04: 17 g
  Filled 2022-12-04: qty 1

## 2022-12-04 MED ORDER — FENTANYL CITRATE PF 50 MCG/ML IJ SOSY
25.0000 ug | PREFILLED_SYRINGE | INTRAMUSCULAR | Status: DC | PRN
  Administered 2022-12-04 – 2022-12-05 (×10): 50 ug via INTRAVENOUS
  Filled 2022-12-04 (×11): qty 1

## 2022-12-04 MED ORDER — PANTOPRAZOLE SODIUM 40 MG IV SOLR
40.0000 mg | Freq: Two times a day (BID) | INTRAVENOUS | Status: DC
  Administered 2022-12-04 – 2022-12-06 (×5): 40 mg via INTRAVENOUS
  Filled 2022-12-04 (×5): qty 10

## 2022-12-04 MED ORDER — METHYLPREDNISOLONE SODIUM SUCC 125 MG IJ SOLR: 125.0000 mg | Freq: Three times a day (TID) | INTRAMUSCULAR | Status: DC

## 2022-12-04 NOTE — Progress Notes (Signed)
[facility] [address] [employer] [facility]) Hospital Liaison note:  This patient is currently enrolled in [facility] outpatient-based Palliative Care. Will continue to follow for disposition.  Please call with any outpatient palliative questions or concerns.  Thank you, Lorelee Market, LPN [facility] Liaison [phone]

## 2022-12-04 NOTE — Progress Notes (Signed)
   12/04/22 1000  Clinical Encounter Type  Visited With Patient not available;Health care provider  Visit Type Initial  Referral From Palliative care team   Collins attempted to visit pt. per [facility] consult from palliative care to discuss advance directives, but unfortunately pt. required intubation after consult was placed and is unable to discuss advanced care planning at this time.  [place] consulted briefly w/palliative care provider who shared that pt.'s fiance is also not currently at bedside.  Will make referral for potential future follow-up from spiritual care team.  Lindaann Pascal PRN Chaplain Pager: [phone]

## 2022-12-04 NOTE — Significant Event (Signed)
Rapid Response Event Note   Reason for Call :  Increased work of breathing.  Initial Focused Assessment:  Patient alert, but lethargic and having difficulty speaking at whisper. Breathing is shallow and rapid with significant accessory muscle use. Patient is oriented x 4. Lungs severely congested and diminished. Audible gurgling from back of throat and has coughed up yellow and clear phlegm as seen on bedside table. Heart rate is in 110's and EKG shows sinus tachycardia. No obvious swelling seen peripherally.  Interventions:  Assessment and chart review. Vitals as documented. Oxycodone and Duonebs administered as prescribed without achieving desired relief of symptoms. Care discussion with patient, myself, and on call provider. Patient able to make his needs known and elects full aggressive treatment. Alerted staff on both units that patient will transfer to stepdown.  Plan of Care:  Transferred to stepdown unit room 1234 to utilize BIPAP as patient stated he wanted full code with aggressive treatment. Left message for family contact person, Ms. Hooks to inform of transfer. Informed by Lab just after arrival to 1234 that pCO2 is 70 on ABG; Provider updated.  Event Summary:   MD Notified: A. Zebedee Iba, NP Call Time: [place] Time: [place] Time: [place], RN

## 2022-12-04 NOTE — Progress Notes (Signed)
[facility] RN NOTE  PATIENT NAME: Mark Yu DOB: [birth year] MRN: [id number]  PRIMARY CARE PROVIDER: Jennette Dubin, NP  RESPONSIBLE PARTY: Mark Yu (wife) Acct ID - Guarantor Home Phone Work Phone Relationship Acct Type  [id number] [name], [name]  Self P/F     [address] [place], [place], [place] [address]   RN completed initial palliative care visit with patient and his wife in his home.   Diagnosis: Lung cancer with liver mets. Patient states that his cancer is advancing and he was given a prognosis of 3 months. He wants to continue with treatments. He has completed 2 rounds of chemo, however his cancer continued to progress. He is to start a 3rd round of a stronger chemo tomorrow. Has R chest PAC.  Cognitive: Alert and oriented x 4. Forgetful at times. Pleasant mood and engaging. Did become tearful several times when speaking about his cancer.  Skin: Patient went to the ED 2 days ago due to bleeding noted at the site where he had his feeding tube removed 4 weeks ago. He was treated and released. Currently taking Doxycycline BID. He says it has since stopped bleeding and appears that it is finally starting to close up. Current dressing is dry and intact. Scattered bruising and scabs noted to his arms/hands from a fall.  Pain: Reports pain in his lower back, more on the right side.   Respiratory: Does experience some dyspnea with exertion but recovers fairly quickly at rest. Has not required oxygen. Has a productive cough. Phlegm is white. He continues to smoke cigarettes. Anxiety managed better with Diazepam vs Xanax.  Mobility: He is ambulatory using his walker or cane. High fall risk. Recent falls, where he has hurt his back x 2. One fall he slid out of the bed and scraped up his arms. He is able to bathe and dress himself, but it does tire him out. 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   Appetite:  Very poor. He states that anything he eats/drinks ends up gushing out the open area where the feeding tube was. He is very thin, frail and cachectic. Blood sugar today 147. He had a g-tube for 7 months and it was taken out a month ago.  Cardiovascular: Heart rate elevation to the 150s when he ambulates. Normal range at rest is 110-115 bpm.  Goals of care: Wants to continue with chemotherapy treatments for as long as he can tolerate them. Wants to continue working for as long as possible. He is a Full code. He agrees to future visits from palliative care.  He is also involved with [facility] who makes home visits.   CODE STATUS: Full code ADVANCED DIRECTIVES: Y MOST FORM: no PPS: 50%   PHYSICAL EXAM:   VITALS: Today's Vitals   11/25/22 0954  BP: 139/70  Pulse: 100  Resp: 19  Temp: 98.1 F (36.7 C)  TempSrc: Temporal  SpO2: 98%  PainSc: 3   PainLoc: Back      Daryl Eastern, RN BSN

## 2022-12-04 NOTE — Assessment & Plan Note (Signed)
-  Patient on presentation with concerns for multifocal pneumonia noted on CT angiogram chest. -Urine strep pneumococcus antigen negative, urine Legionella antigen negative. -COVID-19 PCR negative. -Influenza AMB PCR negative. -MRSA PCR negative. -Blood cultures pending. -Patient noted to have gone into worsening respiratory distress transferred to the ICU placed on BiPAP, seen by PCCM subsequently intubated. -Continue Atrovent and Xopenex nebs. -Discontinue Dulera and placed on Brovana and Pulmicort nebs. -Initially was on IV vancomycin which has been discontinued. -Continue IV cefepime and change oral doxycycline to IV doxycycline. -PCCM following.

## 2022-12-04 NOTE — Progress Notes (Signed)
Upon initial assessment tonight, pt with labored breathing and difficulty speaking. Primary RN called RT to give 2000 nebulizer treatment early and assess state of patient. Nebulizer was administered with minimal improvement. Second RT notified and assessed patient. RT informed pt that bipap may be beneficial but pt declined. RT recommended to admin prn valium per pt request to help with anxiety. Pt stated that he felt better. Around 0230, pt's mews turned yellow d/t HR sustaining 115-117. Pt remained with labored breathing. MD and charge RN notified and made aware. RRT and respiratory called to bedside. MD at bedside. Pt transferred to ICU with RRT. Belongings with pt and report given to [employer], Therapist, sports.

## 2022-12-04 NOTE — Assessment & Plan Note (Signed)
Per oncology °

## 2022-12-04 NOTE — Progress Notes (Signed)
PT Cancellation Note  Patient Details Name: Mark Yu MRN: [id number] DOB: January 29, 1956   Cancelled Treatment:    Reason Eval/Treat Not Completed: Medical issues which prohibited therapy Patient with rapid response this morning. Transferred to stepdown and currently on Bipap. Will follow and eval patient when medically appropriate for therapy.    Myrtis Hopping [name] 12/04/2022, 8:27 AM Arlyce Dice, DPT Physical Therapist Acute [facility] Services Preferred contact method: Secure Chat Weekend Pager Only: [phone] Office: [phone]

## 2022-12-04 NOTE — Progress Notes (Signed)
Daily Progress Note   Patient Name: Mark Yu       Date: 12/04/2022 DOB: 1956/08/18  Age: 66 y.o. MRN#: [id number] Attending Physician: Eugenie Filler, MD Primary Care Physician: Jennette Dubin, NP Admit Date: 12/12/2022 Length of Stay: 2 days  Reason for Consultation/Follow-up: Establishing goals of care  Subjective:   CC: Patient now intubated in ICU and unable to interact.  Following up regarding complex medical decision making.  Subjective:  Extensive review of EMR due to change in status.  Overnight and into this morning patient's respiratory status worsen with increased respiratory rate, work of breathing, and associated tachycardia.  Patient was full code at that time and so intubated and transferred to ICU.  Discussed care with PCCM physician as occurred before could follow-up to see patient today to further discuss CODE STATUS. Patient's significant other was called when status deteriorated though there was no answer.  Patient's sister was then called who noted she would travel to hospital though was a few hours away.  Patient seen later in the morning in the ICU.  No family initially present at bedside.  Discussed care with ICU RN.   When visiting later in the morning, patient's Sister Mark Yu was present at bedside.  Able to introduce myself and my role in patient's care.  Mark Yu was incredibly grateful for involvement of palliative care and notes that she has friends involved with the palliative care work.  Inquired if she had been able to get a hold of patient's significant other though she noted she had not.  Able to discuss medical updates with Mark Yu regarding patient's current level of care.  Patient having NG tube placed during visit.  While talking with Mark Yu, patient's significant other Mark Yu called Mark Yu.  Mark Yu was able to update her regarding patient's medical status.  I was also personally able to speak to [place] to provide medical updates and condition.  Mark Yu is unfortunately sick at home with what she believes to be the flu.  Acknowledged need for her to stay at home and take care of herself as did not want to make patient sicker with visiting.  Discussed with her what I had been discussing with the patient yesterday.  Patient's quality of life was important to him and he wanted to be able to live to continue doing things he enjoyed such as being on the radio.  I explained to her what I had talked to the patient about which was concerns that putting patient on a ventilator and doing CPR would only tie the patient to the hospital and not allow him to return to the quality of life he still found important. Mark Yu acknowledged this.  She noted that patient had always said he wanted to be a Management consultant" to again which we discussed what he was fighting for which she noted would be time on the radio and time at home.  Mark Yu expressed that she had not heard patient was so ill previously as she has not been to all appointments with him.  She did know patient had metastatic cancer though she did not know that hospice had been recommended to patient already.  She says if she would have known that she would have known how serious it was.  We discussed the condition surrounding patient's cancer and that has continued to progress which is causing his body to fail. Mark Yu then asked if patient can at least come home to die.  Stated that currently he is on a ventilator and  so he cannot leave the hospital in this situation. Mark Yu appropriately tearful so provided emotional support as able.  We discussed providing patient with appropriate medical therapies moving forward with the hope we can get him off the ventilator to allow him more time at home.  Discussed CODE STATUS again and explained what full code versus DNR entailed.  After hearing the details, Mark Yu stated patient would never want to suffer from chest compressions if his heart were to stop as that would not  allow him to get back to doing things he loved and so she agrees with change of CODE STATUS to DNR at this time.  Also discussed with Mark Yu that patient is not only on ventilator support, also on blood pressure support due to hypotension at this time and he is on 2 forms of life support.  Discussed it will probably be before tomorrow before sedation is even weaned to see if patient can participate enough to come off the ventilator.  Would need to continue conversations moving forward to determine if patient will need to be comfortable here versus could make at home to be at peace.  Also discussed that patient's level of care should not be escalated further as we know that the underlying disease with cancer cannot be fixed.  Discussed that should patient's blood pressure continued to deteriorate, teams would reach out and inform her that patient's body is saying "it is time".  Noted we continue current medication and antibiotics to see if patient's pneumonia can improve to the point where he can get off the ventilator. Mark Yu agreeing with this plan.  All questions answered at that time for both [place].  Thanked them for allowing me to talk with them today.  Will continue to follow along.  Informed PCCM team and bedside RN change in CODE STATUS.   Review of Systems  Objective:   Vital Signs:  BP (!) 147/94   Yu (!) 133   Temp 97.8 F (36.6 C)   Resp (!) 30   Ht 5\' 11"  (1.803 m)   Wt 47.6 kg   SpO2 100%   BMI 14.64 kg/m   Physical Exam: General: Intubated, sedated, frail, cachectic, ill-appearing Eyes: No drainage noted HENT: ETT in place Cardiovascular: RRR, no edema in LE b/l Respiratory: Intubated on ventilator support  Abdomen: not distended Extremities: Muscle wasting present Skin: Ecchymoses on upper extremities bilaterally Neuro: Intubated and sedated Psych: Unable to assess at this time  Imaging: I personally reviewed recent imaging.   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  Progression of patient's cancer has been seen on recent imaging.  During this admission patient found to have acute hypoxic respiratory failure secondary to multifocal pneumonia in the setting of metastatic lung cancer; receiving appropriate management for this.  Patient had recent visit with oncology on 11/26/2022 discussing medical care moving forward to which patient elected to proceed with cancer directed therapies.  Patient seen by palliative medicine team previously.  Palliative medicine team consulted to assist with complex medical decision making and symptom management.   # Complex medical decision making/goals of care                -Patient unable to participate in complex medical decision making today due  to deterioration in medical status requiring intubation and ventilator support.    -Spoke with patient's significant other,Mark Yu, via telephone and sister, Mark Yu, at bedside.Mark Yu is sick herself with what she believes to be the flu.  Encouraged her not to come to the hospital and need to focus on her own care.  Did discuss in detail turns this provider had expressed to patient yesterday about interventions such as ventilator support and CPR tying him to the hospital and not allowing him the quality of life he found so important.  While [place] acknowledges that patient wanted to "fight" she also states patient would not want to live in situation where he was tied to a ventilator with broken ribs because his heart stopped.  After discussions, Mark Yu agreeing with change of CODE STATUS to DNR.  -Continue current level of medical interventions with hope that patient can be extubated. Mark Yu inquired if patient could come off  the ventilator so that he could die at home.  Stated we will need to continue conversations evaluating patient's status as we will have to determine day by day.  No matter the pathway that is determined by patient's body moving forward, we will continue to inform of status.  -If patient should further deteriorate, Mark Yu should be informed of this.  Discussed how further escalation of inventions such as adding more pressors would not change patient's underlying condition/cancer and would only prolong suffering. Mark Yu acknowledged and agreed with this.    Code Status: DNR   # Symptom management                Had started medication yesterday for symptom management though patient's medical status deteriorated.  Now in ICU, sedated and intubated.  # Psycho-social/Spiritual Support:  - Support System: Significant other of 15 years, sisters  Discussed with: Patient's sister, patient's significant other -Mark Yu , PCCM team, bedside RN  Thank you for allowing the palliative care team to participate in the care Mark Yu.  Chelsea Aus, DO Palliative Care Provider PMT # [phone]  If patient remains symptomatic despite maximum doses, please call PMT at [phone] between 0700 and 1900. Outside of these hours, please call attending, as PMT does not have night coverage.  This provider spent a total of 65 minutes providing patient's care.  Includes review of EMR, discussing care with other staff members involved in patient's medical care, obtaining relevant history and information from patient and/or patient's family, and personal review of imaging and lab work. Greater than 50% of the time was spent counseling and coordinating care related to the above assessment and plan.

## 2022-12-04 NOTE — Progress Notes (Addendum)
       CROSS COVER NOTE  NAME: Mark Yu MRN: [id number] DOB : April 10, 1956    Date of Service   12/04/2022   HPI/Events of Note   0245-notified by bedside RN of patient's increased WOB.  Orders for stat DuoNeb, EKG, chest x-ray were placed. EKG reviewed as ST w/ no ST change.  During bedside assessment patient was found to be tachypneic, RR~20s.  Accessory muscle use noted.  Patient was on 4 L nasal cannula with O2 saturation 100%.  Due to WOB, patient has difficulty communicating in full sentences.  However, he is able to answer simple questions.  He remains alert and oriented x 4.  Patient denies any relief after DuoNeb.  Coarse rhonchi bilateral, more on right than left.    Patient is asked regarding his CODE STATUS.  He wishes to remain full code and is agreeable to CPR, intubation, and other aggressive measure needed.   This episode of acute respiratory failure is likely a contribution of his metastatic small cell lung cancer, pna, and COPD.  ABG obtained: pH 7.26, pCO2 70, pO2 81, bicarb 31.1.  Patient has been placed on BiPAP and will be transferred to stepdown unit.  0520- patient is alert to voice.  He denies feeling relief despite steroid, IV magnesium, and BiPAP use.  He continues to be tachypneic with RR in the 30s.  PCCM (Dr. Valeta Harms) has been consulted as patient may require additional respiratory interventions, like intubation.  At this time, we will continue to monitor patient while on BiPAP.  Repeat ABG ordered for this morning.  Day team to follow.   Interventions/ Plan   DuoNeb, steroid, IV magnesium EKG, chest x-ray, ABG, BiPAP PCCM consulted completed       Raenette Rover, DNP, [place]

## 2022-12-04 NOTE — Consult Note (Addendum)
NAME:  Mark Yu, MRN:  [id number], DOB:  01-01-1956, LOS: 2 ADMISSION DATE:  12/03/2022, CONSULTATION DATE:  12/04/22 REFERRING MD:  Grandville Silos, CHIEF COMPLAINT:  dyspnea   History of Present Illness:  66yM with extensive stage small cell on 3rd line chemo with progression f/b Dr. Julien Nordmann, COPD, AF, g-tube, who presented 12/5 with 4 days of increased cough, dyspnea. He was admitted for acute hypoxic, chronic hypercapnic respiratory failure due to multifocal pneumonia and was started on vanc, cefepime and steroids. Unfortunately deteriorated over the course of the night 12/03/22 requiring continuous NIV. PCCM consulted overnight 12/04/22 for increased WOB, lethargy despite NIV.   Pertinent  Medical History  Extensive stage small cell lung ca COPD AF Chronic hypoxic hypercapnic respiratory failure Tracheostomy and decannulation 04/15/22  Significant Hospital Events: Including procedures, antibiotic start and stop dates in addition to other pertinent events   12/5 admitted started on steroids, vanc/cefepime, CTA negative for PE 12/7-12/8 escalated to continuous NIV, remains lethargic   Interim History / Subjective:   Objective   Blood pressure (!) 147/94, pulse (!) 133, temperature 97.8 F (36.6 C), resp. rate (!) 30, height 5\' 11"  (1.803 m), weight 47.6 kg, SpO2 97 %.    FiO2 (%):  [32 %-40 %] 40 %   Intake/Output Summary (Last 24 hours) at 12/04/2022 0756 Last data filed at 12/04/2022 0556 Gross per 24 hour  Intake 1836.32 ml  Output 1875 ml  Net -38.68 ml   Filed Weights   12/02/22 1447  Weight: 47.6 kg    Examination: General appearance: 66 y.o., male, frail, chronically ill  Lungs: dminished bl, increased work of breathing CV: tachycardic IRIR, no murmur  Abdomen: Soft, non-tender; non-distended, BS hypoactive Extremities: No peripheral edema, warm Skin: Normal turgor and texture; no rash Neuro: drowsy does not arouse to voice   Resolved Hospital Problem list     Assessment & Plan:   Acute on chronic hypoxic hypercapnic respiratory failure AECOPD Acute metabolic encephalopathy  CAP with multidrug resistance risk factors Sedation related hypotension Extensive stage small cell lung ca with progression Recent acute urinary retention with foley in place till visit with uro 12/14 Possible g-tube cellulitis pAF HTN  - full vent support - VAP bundle, pad protocol sedation for rass -1 - wean levo for map 65 - continue cefepime, doxy - solumedrol 40 mg daily - bronchodilators - unclear what rate/rhythm control agent he may need post-intubation once settles out on sedation - ongoing goals of care discussion, PMT following   Best Practice (right click and "Reselect all SmartList Selections" daily)   Diet/type: NPO w/ meds via tube DVT prophylaxis: LMWH GI prophylaxis: PPI Lines: N/A - has port Foley:  Yes, and it is still needed Code Status:  full code Last date of multidisciplinary goals of care discussion [12/04/22 spoke with pt's sister [name] regarding his deterioration.]  Labs   CBC: Recent Labs  Lab 12/03/2022 1428 12/02/22 0540 12/03/22 0629  WBC 13.6* 10.3 6.8  NEUTROABS 12.8*  --  5.9  HGB 11.2* 10.1* 8.9*  HCT 35.6* 31.9* 28.6*  MCV 100.3* 100.3* 100.7*  PLT 211 202 102    Basic Metabolic Panel: Recent Labs  Lab 12/05/2022 1428 12/02/22 0540 12/03/22 0629  NA 138 136 139  K 3.7 3.4* 3.4*  CL 99 97* 101  CO2 29 30 27   GLUCOSE 112* 84 91  BUN 16 13 11   CREATININE 0.69 0.56* 0.51*  CALCIUM 9.2 8.7* 7.9*  MG  --  2.1 1.8  GFR: Estimated Creatinine Clearance: 62 mL/min (A) (by C-G formula based on SCr of 0.51 mg/dL (L)). Recent Labs  Lab 12/14/2022 1428 12/02/22 0540 12/03/22 0629  WBC 13.6* 10.3 6.8    Liver Function Tests: Recent Labs  Lab 12/10/2022 1428  AST 20  ALT 17  ALKPHOS 109  BILITOT 0.8  PROT 7.0  ALBUMIN 4.0   No results for input(s): "LIPASE", "AMYLASE" in the last 168 hours. No results  for input(s): "AMMONIA" in the last 168 hours.  ABG    Component Value Date/Time   PHART 7.26 (L) 12/04/2022 0330   PCO2ART 70 (HH) 12/04/2022 0330   PO2ART 81 (L) 12/04/2022 0330   HCO3 31.1 (H) 12/04/2022 0330   ACIDBASEDEF 4.6 (H) 03/09/2022 1836   O2SAT 98 12/04/2022 0330     Coagulation Profile: No results for input(s): "INR", "PROTIME" in the last 168 hours.  Cardiac Enzymes: No results for input(s): "CKTOTAL", "CKMB", "CKMBINDEX", "TROPONINI" in the last 168 hours.  HbA1C: No results found for: "HGBA1C"  CBG: Recent Labs  Lab 12/09/2022 1433  GLUCAP 124*    Review of Systems:   12 point review of systems is negative except as in HPI  Past Medical History:  He,  has a past medical history of Anxiety, COPD (chronic obstructive pulmonary disease) ([place]), Depression, Dyspnea, HTN (hypertension), and Hyponatremia (03/08/2022).   Surgical History:   Past Surgical History:  Procedure Laterality Date   BRONCHIAL BIOPSY  02/02/2022   Procedure: BRONCHIAL BIOPSIES;  Surgeon: Collene Gobble, MD;  Location: [facility] ENDOSCOPY;  Service: Pulmonary;;   BRONCHIAL BRUSHINGS  02/02/2022   Procedure: BRONCHIAL BRUSHINGS;  Surgeon: Collene Gobble, MD;  Location: [facility] ENDOSCOPY;  Service: Pulmonary;;   BRONCHIAL NEEDLE ASPIRATION BIOPSY  02/02/2022   Procedure: BRONCHIAL NEEDLE ASPIRATION BIOPSIES;  Surgeon: Collene Gobble, MD;  Location: [facility] ENDOSCOPY;  Service: Pulmonary;;   IR GASTROSTOMY TUBE MOD SED  04/03/2022   IR GASTROSTOMY TUBE REMOVAL  11/04/2022   IR IMAGING GUIDED PORT INSERTION  02/24/2022   IR PATIENT EVAL TECH 0-60 MINS  11/27/2022   MRI     NO PAST SURGERIES     VIDEO BRONCHOSCOPY WITH ENDOBRONCHIAL ULTRASOUND N/A 02/02/2022   Procedure: VIDEO BRONCHOSCOPY WITH ENDOBRONCHIAL ULTRASOUND;  Surgeon: Collene Gobble, MD;  Location: [facility] ENDOSCOPY;  Service: Pulmonary;  Laterality: N/A;   VIDEO BRONCHOSCOPY WITH RADIAL ENDOBRONCHIAL ULTRASOUND  02/02/2022   Procedure: VIDEO BRONCHOSCOPY  WITH RADIAL ENDOBRONCHIAL ULTRASOUND;  Surgeon: Collene Gobble, MD;  Location: [facility] ENDOSCOPY;  Service: Pulmonary;;     Social History:   reports that he quit smoking about 8 months ago. His smoking use included cigarettes. He has a 12.50 pack-year smoking history. He does not have any smokeless tobacco history on file. He reports current alcohol use of about 30.0 standard drinks of alcohol per week. He reports current drug use. Drug: Marijuana.   Family History:  His family history includes Alzheimer's disease in his mother; Multiple sclerosis in his father.   Allergies Allergies  Allergen Reactions   Amoxicillin Rash   Macrolides And Ketolides Other (See Comments)    Reaction not recalled     Home Medications  Prior to Admission medications   Medication Sig Start Date End Date Taking? Authorizing Provider  albuterol (PROVENTIL) (2.5 MG/3ML) 0.083% nebulizer solution Take 2.5 mg by nebulization every 6 (six) hours as needed for wheezing or shortness of breath.   Yes [provider]  albuterol (VENTOLIN HFA) 108 (90 Base) MCG/ACT  inhaler Inhale 2 puffs into the lungs every 6 (six) hours as needed for wheezing or shortness of breath.   Yes [provider]  amLODipine (NORVASC) 5 MG tablet Take 5 mg by mouth daily. 12/28/21  Yes [provider]  BREZTRI AEROSPHERE 160-9-4.8 MCG/ACT AERO Inhale 2 puffs into the lungs in the morning and at bedtime. 01/02/22  Yes [provider]  CLARITIN 10 MG tablet Take 10 mg by mouth daily.   Yes [provider]  cyclobenzaprine (FLEXERIL) 10 MG tablet Take 10 mg by mouth at bedtime as needed for muscle spasms.   Yes [provider]  dextromethorphan-guaiFENesin (ROBITUSSIN-DM) 10-100 MG/5ML liquid Take 10 mLs by mouth 2 (two) times daily as needed for cough.   Yes [provider]  diazepam (VALIUM) 10 MG tablet Take 10 mg by mouth 2 (two) times daily as needed for anxiety. 11/18/22  Yes [provider]  doxycycline (VIBRA-TABS) 100 MG tablet Take 1 tablet (100 mg total) by mouth 2 (two) times daily. 11/24/22  Yes Jacqualine Mau, NP  fluticasone (FLONASE) 50 MCG/ACT nasal spray Place 2 sprays into both nostrils daily as needed for allergies or rhinitis.   Yes [provider]  ibuprofen (ADVIL) 200 MG tablet Take 400 mg by mouth every 6 (six) hours as needed for mild pain or headache.   Yes [provider]  lidocaine (XYLOCAINE) 2 % jelly Apply 1 Application topically 4 (four) times daily as needed. Patient taking differently: Apply 1 Application topically 4 (four) times daily as needed (for pain). 11/24/22  Yes Gareth Morgan, MD  lidocaine-prilocaine (EMLA) cream Apply to the Port-A-Cath site 30-60 minutes before chemotherapy 02/09/22  Yes Curt Bears, MD  loperamide (IMODIUM) 2 MG capsule Take 2 tabs by mouth with first loose stool, then 1 tab with each additional loose stool as needed. Do not exceed 8 tabs in a 24-hour period Patient taking differently: Take 4 mg by mouth See admin instructions. Take 2 tabs by mouth with first loose stool, then 1 tab with each additional loose stool as needed. Do not exceed 8 tabs in a 24-hour period 11/24/22  Yes Curt Bears, MD  omeprazole (PRILOSEC OTC) 20 MG tablet Take 1 tablet (20 mg total) by mouth 2 (two) times daily. 11/24/22  Yes Jacqualine Mau, NP  predniSONE (DELTASONE) 10 MG tablet TAKE TWO TABLETS BY MOUTH DAILY WITH BREAKFAST Patient taking differently: Take 20 mg by mouth daily with breakfast. 09/01/22  Yes Byrum, Rose Fillers, MD  prochlorperazine (COMPAZINE) 10 MG tablet Take 1 tablet (10 mg total) by mouth every 6 (six) hours as needed for nausea or vomiting. 11/24/22  Yes Curt Bears, MD  tiZANidine (ZANAFLEX) 4 MG tablet Take 4 mg by mouth every 8 (eight) hours as needed for muscle spasms.   Yes [provider]  traMADol (ULTRAM) 50 MG tablet Take 50 mg by mouth 3 (three) times  daily as needed (for pain). 11/13/22  Yes [provider]  Nutritional Supplements (FEEDING SUPPLEMENT, OSMOLITE 1.5 CAL,) LIQD Place 1,000 mLs into feeding tube continuous. Patient not taking: Reported on 12/03/2022 04/17/22   Charlynne Cousins, MD  pantoprazole (PROTONIX) 20 MG tablet Take 2 tablets (40 mg total) by mouth daily. Patient not taking: Reported on 12/21/2022 11/24/22 12/24/22  Gareth Morgan, MD  sildenafil (REVATIO) 20 MG tablet  10/01/22 01/31/23  [provider]  Water For Irrigation, Sterile (FREE WATER) SOLN Place 150 mLs into feeding tube 6 (six) times daily.  Patient not taking: Reported on 12/07/2022 04/17/22   Charlynne Cousins, MD     Critical care time: 36 minutes

## 2022-12-04 NOTE — Progress Notes (Signed)
OT Cancellation Note  Patient Details Name: Mark Yu MRN: [id number] DOB: 03-08-1956   Cancelled Treatment:    Reason Eval/Treat Not Completed: Medical issues which prohibited therapy. Patient with rapid response this morning. Transferred to stepdown and currently on Bipap. Will follow and eval patient when medically appropriate for therapy.  [name] [name] 12/04/2022, 7:49 AM

## 2022-12-04 NOTE — Progress Notes (Addendum)
   12/04/22 0234  Assess: MEWS Score  Temp 97.8 F (36.6 C)  BP (!) 154/80  MAP (mmHg) 100  Pulse Rate (!) 117  Resp (!) 22  SpO2 93 %  O2 Device Nasal Cannula  O2 Flow Rate (L/min) 4 L/min  Assess: MEWS Score  MEWS Temp 0  MEWS Systolic 0  MEWS Pulse 2  MEWS RR 1  MEWS LOC 0  MEWS Score 3  MEWS Score Color Yellow  Assess: if the MEWS score is Yellow or Red  Were vital signs taken at a resting state? Yes  Focused Assessment Change from prior assessment (see assessment flowsheet)  Does the patient meet 2 or more of the SIRS criteria? No  Does the patient have a confirmed or suspected source of infection? No  Provider and Rapid Response Notified? Yes  MEWS guidelines implemented *See Row Information* Yes  Treat  Pain Scale 0-10  Pain Score 1  Take Vital Signs  Increase Vital Sign Frequency  Yellow: Q 2hr X 2 then Q 4hr X 2, if remains yellow, continue Q 4hrs  Escalate  MEWS: Escalate Yellow: discuss with charge nurse/RN and consider discussing with provider and RRT  Notify: Charge Nurse/RN  Name of Charge Nurse/RN Notified Tom  Date Charge Nurse/RN Notified 12/04/22  Time Charge Nurse/RN Notified 0249  Provider Notification  Provider Name/Title Chavees NP  Date Provider Notified 12/04/22  Time Provider Notified 0250  Method of Notification Page  Notification Reason Change in status  Provider response En route;See new orders  Date of Provider Response 12/04/22  Time of Provider Response 0240  Notify: Rapid Response  Name of Rapid Response RN Notified Mandy RN  Date Rapid Response Notified 12/04/22  Time Rapid Response Notified 0230  Document  Patient Outcome Other (Comment) (Stable)  Assess: SIRS CRITERIA  SIRS Temperature  0  SIRS Pulse 1  SIRS Respirations  1  SIRS WBC 1  SIRS Score Sum  3

## 2022-12-04 NOTE — Progress Notes (Addendum)
Rapid RN, On Call provider Cheeves, Resp therapist also at bedside. New orders followed. Chest Xray taken, neb given. Pt is alert and oriented. RN will continue to monitor and update as needed.   Pt transferred to ICU with Rapid RN and [place] Ar RN.

## 2022-12-04 NOTE — Progress Notes (Signed)
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 Mark Yu noted to have had recent progression of lung cancer to his liver since then has been evaluated in the ED for urinary retention discharged with indwelling Foley 11/21, given Doxy for possible cellulitis around G-tube stoma, 1128 and increased leakage through his G-tube stoma IR consulted recommendation to allow GI site to heal with zinc and lidocaine. Mark Yu presented to IR on day of admission with G-tube stoma check and noted to be hypoxic with sats of 80% on room air requiring 2 L nasal cannula and sent to the ED. Mark Yu noted to have had complaints of shortness of breath and cough x4 days. Mark Yu seen in the ED CT angiogram chest done concerning for patchy infiltrate in both lower lobes suggesting multifocal pneumonia. Mark Yu admitted and placed empirically on IV antibiotics.  Mark Yu initially was improving however decompensated in the early hours of 12/04/2022, with respiratory acidosis, increased work of breathing, transferred to the unit placed on BiPAP.  PCCM subsequently consulted and Mark Yu subsequently intubated 12/04/2022.   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)   Essential hypertension   PAF (paroxysmal atrial fibrillation) (HCC)   Metastases to the liver [facility])   Multifocal pneumonia   T9 vertebral fracture (HCC)   Wound cellulitis   Acute urinary retention   Indwelling Foley catheter present   Need for  emotional support   High risk medication use   Anorexia   Goals of care, counseling/discussion   Palliative care encounter   DNR (do not resuscitate)  Assessment and Plan: * Acute hypoxic respiratory failure ([place]) - Mark Yu initially admitted with acute respiratory failure with hypoxia felt initially secondary to multifocal pneumonia in the setting of metastatic lung cancer on chemotherapy ending of COPD. -Presented with saturation of 70% requiring 2 L via nasal cannula. -Mark Yu initially improved clinically however noted in the morning of 12/04/2022 to go into acute respiratory distress with increased work of breathing, ABG done consistent with acute respiratory acidosis and transferred to the ICU and placed on BiPAP.   -Discontinue Dulera and placed on Pulmicort and Brovana nebs.   -Change oral prednisone to IV Solu-Medrol 125 mg every 6 hours.   -Change oral PPI to IV PPI twice daily.   -Continue IV cefepime.   -Change oral Doxy to IV Doxy.   -Initially placed on BiPAP. -PCCM consulted and Mark Yu subsequently intubated this morning 12/04/2022.  COPD with acute exacerbation ([place]) -Mark Yu noted overnight to go into acute respiratory distress with increased work of breathing. -ABG done on with acute respiratory acidosis with a pH of 7.26/pCO2 of 70/pO2 of 81. -Mark Yu transferred to ICU and placed on BiPAP. -Discontinue Dulera and place on Pulmicort and Brovana nebs. -Continue scheduled Xopenex and Atrovent nebs. -Continue Claritin, Flonase. -Change oral PPI to IV PPI every 12 hours. -PCCM consulted for further evaluation and management. -Mark Yu subsequently intubated.  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 Inda Merlin with oncology -unfortunately had  recurrent and metastasis of his cancer to liver. Currently on 3rd line palliative chemotherapy with poor prognosis. -Seen by oncology 12/03/2022.  Acute urinary retention S/p indwelling catheter since  being evaluated in ED on 11/17/22 for urinary retention -has appt with urology around 12/10/22 for outpatient voiding trial -continue catheter care   Wound cellulitis -stoma from previous G-tube has been non-healing. Has been following IR and just had it checked today. Has been on doxycycline for close to 2 weeks for wound cellulitis and has been improving -currently on IV cefepime and doxycycline for pneumonia which will provide coverage -wound care per RN   T9 vertebral fracture ([place]) -T9 vertebrae with 50% decrease in ht seen on CTA chest. Pt reports multiple falls and hitting his back about a week ago which could be the case. No active pain. -Supportive care.  Multifocal pneumonia -Mark Yu on presentation with concerns for multifocal pneumonia noted on CT angiogram chest. -Urine strep pneumococcus antigen negative, urine Legionella antigen negative. -COVID-19 PCR negative. -Influenza AMB PCR negative. -MRSA PCR negative. -Blood cultures pending. -Mark Yu noted to have gone into worsening respiratory distress transferred to the ICU placed on BiPAP, seen by PCCM subsequently intubated. -Continue Atrovent and Xopenex nebs. -Discontinue Dulera and placed on Brovana and Pulmicort nebs. -Initially was on IV vancomycin which has been discontinued. -Continue IV cefepime and change oral doxycycline to IV doxycycline. -PCCM following.  Metastases to the liver [facility]) -Per oncology.  PAF (paroxysmal atrial fibrillation) (HCC) -Noted to be in sinus tachycardia during the hospitalization and placed on Lopressor.   -Changed to IV Lopressor as Mark Yu currently intubated.    Essential hypertension -Was on amlodipine and Lopressor.       DVT prophylaxis: Lovenox Code Status: Full Family Communication: No family at bedside.  Disposition: TBD  Status is: Inpatient The Mark Yu will require care spanning > 2 midnights and should be moved to inpatient because: Severity of illness    Consultants:  Oncology notified via epic of Mark Yu admission. PCCM: Dr.Meier 12/04/2022  Procedures:  CT angiogram chest 12/10/2022 Chest x-ray 12/10/2022, 12/04/2022  Antimicrobials:  IV cefepime 12/10/2022>>>> IV vancomycin 12/26/2022>>>>> 12/03/2022 Doxycycline 12/03/2022>>>>   Subjective: Events overnight noted of respiratory decompensation deterioration, Mark Yu transferred to the ICU unit overnight placed on BiPAP, seen by PCCM this morning and currently intubated and sedated.   Objective: Vitals:   12/04/22 0742 12/04/22 0833 12/04/22 1048 12/04/22 1200  BP:      Pulse: (!) 133     Resp: (!) 30     Temp:    98.2 F (36.8 C)  TempSrc:    Oral  SpO2: 97% 100% 100%   Weight:      Height:        Intake/Output Summary (Last 24 hours) at 12/04/2022 1458 Last data filed at 12/04/2022 0556 Gross per 24 hour  Intake 1836.32 ml  Output 1300 ml  Net 536.32 ml   Filed Weights   12/02/22 1447  Weight: 47.6 kg    Examination:  General exam: NAD.  Intubated.  Sedated. Respiratory system: CTA B anterior lung fields.  Intubated.  Cardiovascular system: Tachycardia.  No JVD, no murmurs rubs or gallops.  No lower extremity edema.   Gastrointestinal system: Abdomen is soft, nontender, nondistended, positive bowel sounds.  G-tube covered by bandage.   Central nervous system: Sedated.  Intubated.   Extremities: Unable to assess.   Skin: No rashes, lesions or ulcers Psychiatry: Judgement and insight unable to assess.. Mood & affect unable to assess.  Data Reviewed: I have personally reviewed following labs and imaging studies  CBC: Recent Labs  Lab 12/26/2022 1428 12/02/22 0540 12/03/22 0629 12/04/22 0856  WBC 13.6* 10.3 6.8 14.2*  NEUTROABS 12.8*  --  5.9 13.4*  HGB 11.2* 10.1* 8.9* 9.8*  HCT 35.6* 31.9* 28.6* 32.0*  MCV 100.3* 100.3* 100.7* 101.3*  PLT 211 202 156 202    Basic Metabolic Panel: Recent Labs  Lab 11/30/2022 1428 12/02/22 0540 12/03/22 0629  12/04/22 0856  NA 138 136 139 140  K 3.7 3.4* 3.4* 5.0  CL 99 97* 101 102  CO2 29 30 27 23   GLUCOSE 112* 84 91 113*  BUN 16 13 11  26*  CREATININE 0.69 0.56* 0.51* 0.86  CALCIUM 9.2 8.7* 7.9* 8.5*  MG  --  2.1 1.8 3.2*    GFR: Estimated Creatinine Clearance: 57.7 mL/min (by C-G formula based on SCr of 0.86 mg/dL).  Liver Function Tests: Recent Labs  Lab 12/22/2022 1428  AST 20  ALT 17  ALKPHOS 109  BILITOT 0.8  PROT 7.0  ALBUMIN 4.0    CBG: Recent Labs  Lab 12/08/2022 1433  GLUCAP 124*     Recent Results (from the past 240 hour(s))  Resp Panel by RT-PCR (Flu A&B, Covid) Anterior Nasal Swab     Status: None   Collection Time: 11/28/2022  2:28 PM   Specimen: Anterior Nasal Swab  Result Value Ref Range Status   SARS Coronavirus 2 by RT PCR NEGATIVE NEGATIVE Final    Comment: (NOTE) SARS-CoV-2 target nucleic acids are NOT DETECTED.  The SARS-CoV-2 RNA is generally detectable in upper respiratory specimens during the acute phase of infection. The lowest concentration of SARS-CoV-2 viral copies this assay can detect is 138 copies/mL. A negative result does not preclude SARS-Cov-2 infection and should not be used as the sole basis for treatment or other Mark Yu management decisions. A negative result may occur with  improper specimen collection/handling, submission of specimen other than nasopharyngeal swab, presence of viral mutation(s) within the areas targeted by this assay, and inadequate number of viral copies(<138 copies/mL). A negative result must be combined with clinical observations, Mark Yu history, and epidemiological information. The expected result is Negative.  Fact Sheet for Patients:  EntrepreneurPulse.com.au  Fact Sheet for Healthcare Providers:  IncredibleEmployment.be  This test is no t yet approved or cleared by the [place] FDA and  has been authorized for detection and/or diagnosis of SARS-CoV-2 by FDA  under an Emergency Use Authorization (EUA). This EUA will remain  in effect (meaning this test can be used) for the duration of the COVID-19 declaration under Section 564(b)(1) of the Act, 21 U.S.C.section 360bbb-3(b)(1), unless the authorization is terminated  or revoked sooner.       Influenza A by PCR NEGATIVE NEGATIVE Final   Influenza B by PCR NEGATIVE NEGATIVE Final    Comment: (NOTE) The Xpert Xpress SARS-CoV-2/FLU/RSV plus assay is intended as an aid in the diagnosis of influenza from Nasopharyngeal swab specimens and should not be used as a sole basis for treatment. Nasal washings and aspirates are unacceptable for Xpert Xpress SARS-CoV-2/FLU/RSV testing.  Fact Sheet for Patients: EntrepreneurPulse.com.au  Fact Sheet for Healthcare Providers: IncredibleEmployment.be  This test is not yet approved or cleared by the [place] FDA and has been authorized for detection and/or diagnosis of SARS-CoV-2 by FDA under an Emergency Use Authorization (EUA). This EUA will remain in effect (meaning this test can be used) for the duration of the COVID-19 declaration under Section  564(b)(1) of the Act, 21 U.S.C. section 360bbb-3(b)(1), unless the authorization is terminated or revoked.  Performed at [facility], [place] [address], [place], [place] [address]   Culture, blood (routine x 2)     Status: None (Preliminary result)   Collection Time: 11/30/2022  2:28 PM   Specimen: BLOOD  Result Value Ref Range Status   Specimen Description   Final    BLOOD RIGHT ANTECUBITAL Performed at [place] [address], [place], [place] [address]    Special Requests   Final    BOTTLES DRAWN AEROBIC AND ANAEROBIC Blood Culture results may not be optimal due to an inadequate volume of blood received in culture bottles Performed at [place] [address], [place], [place] [address]    Culture    Final    NO GROWTH 3 DAYS Performed at [facility], [place] [address] [place], [place] [address]    Report Status PENDING  Incomplete  Culture, blood (routine x 2)     Status: None (Preliminary result)   Collection Time: 12/19/2022  2:33 PM   Specimen: BLOOD  Result Value Ref Range Status   Specimen Description   Final    BLOOD PORTA CATH Performed at [facility] [address], [place], [place] [address]    Special Requests   Final    BOTTLES DRAWN AEROBIC AND ANAEROBIC Blood Culture results may not be optimal due to an inadequate volume of blood received in culture bottles Performed at [place] [address], [place], [place] [address]    Culture   Final    NO GROWTH 3 DAYS Performed at [facility], [place] [address], [place], [place] [address]    Report Status PENDING  Incomplete  MRSA Next Gen by PCR, Nasal     Status: None   Collection Time: 12/02/22 12:47 AM   Specimen: Nasal Mucosa; Nasal Swab  Result Value Ref Range Status   MRSA by PCR Next Gen NOT DETECTED NOT DETECTED Final    Comment: (NOTE) The GeneXpert MRSA Assay (FDA approved for NASAL specimens only), is one component of a comprehensive MRSA colonization surveillance program. It is not intended to diagnose MRSA infection nor to guide or monitor treatment for MRSA infections. Test performance is not FDA approved in patients less than 71 years old. Performed at [facility], [place] [address], [place], [place] [address]          Radiology Studies: DG Abd 1 View  Result Date: 12/04/2022 CLINICAL DATA:  NG tube placement EXAM: ABDOMEN - 1 VIEW COMPARISON:  03/31/22 FINDINGS: Interval replacement of the non weighted enteric tube with a weighted enteric tube with the tip projecting over the proximal stomach. No pleural effusion. No pneumothorax. Unchanged cardiac and mediastinal contours. Focal airspace opacity. No displaced rib fractures.  IMPRESSION: Interval replacement of the non weighted enteric tube with a weighted enteric tube with the tip projecting over the proximal stomach. Electronically Signed   By: Marin Roberts M.D.   On: 12/04/2022 13:16   DG CHEST PORT 1 VIEW  Result Date: 12/04/2022 CLINICAL DATA:  Status post intubation. EXAM: PORTABLE CHEST 1 VIEW COMPARISON:  12/04/2022 FINDINGS: Endotracheal tube with the tip 5.1 cm above the carina. Nasogastric tube coursing below the diaphragm with the proximal port above the esophagogastric junction; recommend advancing the nasogastric tube 10 cm. Right-sided Port-A-Cath with the tip projecting over the SVC. Diffuse  bilateral mild interstitial thickening. Lungs are hyperinflated as can be seen with COPD. Stable partially visualized right upper lobe pulmonary nodule measuring approximately 14 mm better seen on the CT chest dated 12/12/2022. No pleural effusion or pneumothorax. Heart and mediastinal contours are unremarkable. No acute osseous abnormality. IMPRESSION: 1. Endotracheal tube with the tip 5.1 cm above the carina. 2. Nasogastric tube coursing below the diaphragm with the proximal port above the esophagogastric junction; recommend advancing the nasogastric tube 10 cm. Electronically Signed   By: Kathreen Devoid M.D.   On: 12/04/2022 09:12   DG Chest Port 1 View  Result Date: 12/04/2022 CLINICAL DATA:  Dyspnea EXAM: PORTABLE CHEST 1 VIEW COMPARISON:  12/19/2022 FINDINGS: The lungs are hyperinflated in keeping with changes of underlying COPD. Right suprahilar nodule is again identified, not significantly changed since prior examination. No superimposed focal pulmonary infiltrate. No pneumothorax or pleural effusion. Cardiac size within normal limits. Right internal jugular chest port tip seen with superior cavoatrial junction. Pulmonary vascularity is normal. No acute bone abnormality. IMPRESSION: 1. No radiographic evidence of acute cardiopulmonary disease. 2. COPD. 3. Stable right  suprahilar nodule. Electronically Signed   By: Fidela Salisbury M.D.   On: 12/04/2022 03:10        Scheduled Meds:  arformoterol  15 mcg Nebulization BID   budesonide (PULMICORT) nebulizer solution  0.5 mg Nebulization BID   Chlorhexidine Gluconate Cloth  6 each Topical Daily   docusate  100 mg Per Tube BID   enoxaparin (LOVENOX) injection  30 mg Subcutaneous Q24H   fluticasone  2 spray Each Nare Daily   guaiFENesin  15 mL Per Tube Q6H   ipratropium  0.5 mg Nebulization TID   levalbuterol  0.63 mg Nebulization TID   loratadine  10 mg Per Tube Daily   methylPREDNISolone (SOLU-MEDROL) injection  40 mg Intravenous Daily   pantoprazole (PROTONIX) IV  40 mg Intravenous Q12H   polyethylene glycol  17 g Per Tube Daily   zinc oxide   Topical BID   Continuous Infusions:  ceFEPime (MAXIPIME) IV Stopped (12/04/22 0437)   doxycycline (VIBRAMYCIN) IV Stopped (12/04/22 1258)   norepinephrine (LEVOPHED) Adult infusion 10 mcg/min (12/04/22 0945)   propofol (DIPRIVAN) infusion 5 mcg/kg/min (12/04/22 0859)     LOS: 2 days    Time spent: 40 minutes    Irine Seal, MD [facility]   To contact the attending provider between 7A-7P or the covering provider during after hours 7P-7A, please log into the web site [email or web address] and access using universal [place] password for that web site. If you do not have the password, please call the hospital operator.  12/04/2022, 2:58 PM

## 2022-12-04 NOTE — Progress Notes (Signed)
Initial Nutrition Assessment  DOCUMENTATION CODES:   Severe malnutrition in context of chronic illness  INTERVENTION:  - Should patient remain intubated, would recommend initiation of enteral nutrition within the next 48 hours as patient extremely malnourished.  -If MD deems it medically appropriate to start TFs, would recommend below TF regimen. OG tube (tip xray verified in stomach): Vital 1.5 at 30 ml/h (720 ml per day) *Recommend starting at 85mL/hr and advancing by only 90mL every 24 hours, and only if electrolytes are stable Prosource TF20 60 ml BID Provides 1240 kcal, 89 gm protein, 550 ml free water daily   - Patient is at VERY high risk of refeeding syndrome given significant weight loss of 10% in 2 months, severe muscle and fat wasting, and metastatic lung cancer on chemotherapy. Monitor magnesium, potassium, and phosphorus BID for at least 3 days, MD to replete as needed.  - Recommend 100mg  thiamine daily if plan to start tube feeds due to risk of refeeding.  - Recommend daily multivitamin to support micronutrient needs once appropriate. - Monitor weight trends daily.   NUTRITION DIAGNOSIS:   Severe Malnutrition related to chronic illness (metastatic small cell lung cancer on chemotherapy and COPD) as evidenced by severe fat depletion, severe muscle depletion, percent weight loss (10% in 2 months).  GOAL:   Patient will meet greater than or equal to 90% of their needs  MONITOR:   Weight trends, Vent status, Labs  REASON FOR ASSESSMENT:   Malnutrition Screening Tool, Ventilator    ASSESSMENT:   66 y.o. 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  12/5 Admit 12/8 Intubated  Patient intubated this morning, sedated at time of visit. Sister at bedside.  Sister reports that from recently talking with patient he  had mentioned losing weight over the past few weeks. Per EMR, patient weighed at 117# in October and now weighed at 105# - a 12# or 10% weight loss in 2 months, significant.   Sister is unsure how patient has been eating recently but notes he was excited to be able to eat regular food. Does drink protein shakes at home but sister unsure what kind.   Patient noted to be following with outpatient cancer RD. Last note from July indicated patient eating some orally and drinking Boost. Also note he was on G tube feeds of Osmolite 1.5 at 79mL/hr x7 hours.  However, G tube was recently removed on 11/8.   Palliative care consulted and following patient.   Suspect patient at VERY high risk of refeeding syndrome. If plan to start tube feeds would recommend very slow advancements with very close monitoring of electrolytes.  Starting with lower estimated needs for calories while patient intubated and with history of COPD. Will closely monitor tolerance and CO2 levels for ability to increase calories.   Medications reviewed and include: Marinol, Colace, Miralax Levophed Propofol  Labs reviewed:  Mg 3.2   NUTRITION - FOCUSED PHYSICAL EXAM:  Flowsheet Row Most Recent Value  Orbital Region Severe depletion  Upper Arm Region Severe depletion  Thoracic and Lumbar Region Severe depletion  Buccal Region Severe depletion  Temple Region Severe depletion  Clavicle Bone Region Severe depletion  Clavicle and Acromion Bone Region Severe depletion  Scapular Bone Region Severe depletion  Dorsal Hand Severe depletion  Patellar Region Severe depletion  Anterior Thigh Region Severe depletion  Posterior Calf Region Severe  depletion  Edema (RD Assessment) None  Hair Reviewed  Eyes Unable to assess  Mouth Unable to assess  Skin Reviewed  Nails Reviewed       Diet Order:   Diet Order             Diet NPO time specified Except for: Sips with Meds  Diet effective now                   EDUCATION  NEEDS:  Not appropriate for education at this time  Skin:  Skin Assessment: Skin Integrity Issues: Skin Integrity Issues:: Other (Comment) Other: open G tube stoma  Last BM:  12/5  Height:  Ht Readings from Last 1 Encounters:  12/02/22 5\' 11"  (1.803 m)   Weight:  Wt Readings from Last 1 Encounters:  12/02/22 47.6 kg    BMI:  Body mass index is 14.64 kg/m.  Estimated Nutritional Needs:  Kcal:  1200-1450 kcals Protein:  70-95 grams Fluid:  >/= 1.4L    Samson Frederic RD, LDN For contact information, refer to [facility].

## 2022-12-04 NOTE — Procedures (Signed)
Intubation Procedure Note  Mark Yu  [id number]  June 09, 1956  Date:12/04/22  Time:9:11 AM   Provider Performing:Mark Yu    Procedure: Intubation (31500)  Indication(s) Respiratory Failure  Consent Risks of the procedure as well as the alternatives and risks of each were explained to the patient and/or caregiver.  Consent for the procedure was obtained and is signed in the bedside chart   Anesthesia Etomidate, Versed, and Rocuronium   Time Out Verified patient identification, verified procedure, site/side was marked, verified correct patient position, special equipment/implants available, medications/allergies/relevant history reviewed, required imaging and test results available.   Sterile Technique Usual hand hygeine, masks, and gloves were used   Procedure Description Patient positioned in bed supine.  Sedation given as noted above.  Patient was intubated with endotracheal tube using Glidescope.  View was Grade 1 full glottis .  Number of attempts was 1.  Colorimetric CO2 detector was consistent with tracheal placement.   Complications/Tolerance None; patient tolerated the procedure well. Chest X-ray is ordered to verify placement.   EBL Minimal   Specimen(s) None

## 2022-12-04 NOTE — Hospital Course (Addendum)
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 Patient noted to have had recent progression of lung cancer to his liver since then has been evaluated in the ED for urinary retention discharged with indwelling Foley 11/21, given Doxy for possible cellulitis around G-tube stoma, 1128 and increased leakage through his G-tube stoma IR consulted recommendation to allow GI site to heal with zinc and lidocaine. Patient presented to IR on day of admission with G-tube stoma check and noted to be hypoxic with sats of 80% on room air requiring 2 L nasal cannula and sent to the ED. Patient noted to have had complaints of shortness of breath and cough x4 days. Patient seen in the ED CT angiogram chest done concerning for patchy infiltrate in both lower lobes suggesting multifocal pneumonia. Patient admitted and placed empirically on IV antibiotics.  Patient initially was improving however decompensated in the early hours of 12/04/2022, with respiratory acidosis, increased work of breathing, transferred to the unit placed on BiPAP.  PCCM subsequently consulted and patient subsequently intubated 12/04/2022.

## 2022-12-05 DIAGNOSIS — Z7189 Other specified counseling: Secondary | ICD-10-CM

## 2022-12-05 DIAGNOSIS — J9601 Acute respiratory failure with hypoxia: Secondary | ICD-10-CM | POA: Diagnosis not present

## 2022-12-05 DIAGNOSIS — R630 Anorexia: Secondary | ICD-10-CM | POA: Diagnosis not present

## 2022-12-05 DIAGNOSIS — Z515 Encounter for palliative care: Secondary | ICD-10-CM | POA: Diagnosis not present

## 2022-12-05 DIAGNOSIS — J189 Pneumonia, unspecified organism: Secondary | ICD-10-CM | POA: Diagnosis not present

## 2022-12-05 LAB — COMPREHENSIVE METABOLIC PANEL
ALT: 105 U/L — ABNORMAL HIGH (ref 0–44)
AST: 87 U/L — ABNORMAL HIGH (ref 15–41)
Albumin: 2.6 g/dL — ABNORMAL LOW (ref 3.5–5.0)
Alkaline Phosphatase: 74 U/L (ref 38–126)
Anion gap: 8 (ref 5–15)
BUN: 26 mg/dL — ABNORMAL HIGH (ref 8–23)
CO2: 28 mmol/L (ref 22–32)
Calcium: 8.1 mg/dL — ABNORMAL LOW (ref 8.9–10.3)
Chloride: 103 mmol/L (ref 98–111)
Creatinine, Ser: 0.67 mg/dL (ref 0.61–1.24)
GFR, Estimated: >60 mL/min (ref >60)
Glucose, Bld: 117 mg/dL — ABNORMAL HIGH (ref 70–99)
Potassium: 4.1 mmol/L (ref 3.5–5.1)
Sodium: 139 mmol/L (ref 135–145)
Total Bilirubin: 0.6 mg/dL (ref 0.3–1.2)
Total Protein: 5.6 g/dL — ABNORMAL LOW (ref 6.5–8.1)

## 2022-12-05 LAB — GLUCOSE, CAPILLARY
Glucose-Capillary: 128 mg/dL — ABNORMAL HIGH (ref 70–99)
Glucose-Capillary: 115 mg/dL — ABNORMAL HIGH (ref 70–99)
Glucose-Capillary: 126 mg/dL — ABNORMAL HIGH (ref 70–99)
Glucose-Capillary: 158 mg/dL — ABNORMAL HIGH (ref 70–99)
Glucose-Capillary: 123 mg/dL — ABNORMAL HIGH (ref 70–99)
Glucose-Capillary: 97 mg/dL (ref 70–99)

## 2022-12-05 LAB — CBC
HCT: 29.3 % — ABNORMAL LOW (ref 39.0–52.0)
Hemoglobin: 9 g/dL — ABNORMAL LOW (ref 13.0–17.0)
MCH: 30.7 pg (ref 26.0–34.0)
MCHC: 30.7 g/dL (ref 30.0–36.0)
MCV: 100 fL (ref 80.0–100.0)
Platelets: 196 10*3/uL (ref 150–400)
RBC: 2.93 MIL/uL — ABNORMAL LOW (ref 4.22–5.81)
RDW: 16.8 % — ABNORMAL HIGH (ref 11.5–15.5)
WBC: 7.5 10*3/uL (ref 4.0–10.5)
nRBC: 0.3 % — ABNORMAL HIGH (ref 0.0–0.2)

## 2022-12-05 LAB — PHOSPHORUS
Phosphorus: 2.1 mg/dL — ABNORMAL LOW (ref 2.5–4.6)
Phosphorus: 2.9 mg/dL (ref 2.5–4.6)

## 2022-12-05 LAB — TRIGLYCERIDES: Triglycerides: 154 mg/dL — ABNORMAL HIGH (ref <150)

## 2022-12-05 LAB — MAGNESIUM
Magnesium: 2.4 mg/dL (ref 1.7–2.4)
Magnesium: 2.2 mg/dL (ref 1.7–2.4)

## 2022-12-05 MED ORDER — PROSOURCE TF20 ENFIT COMPATIBL EN LIQD: 60.0000 mL | Freq: Every day | ENTERAL | Status: DC

## 2022-12-05 MED ORDER — VITAL HIGH PROTEIN PO LIQD: 1000.0000 mL | ORAL | Status: DC

## 2022-12-05 MED ORDER — VITAL 1.5 CAL PO LIQD
1000.0000 mL | ORAL | Status: DC
  Filled 2022-12-05: qty 1000

## 2022-12-05 MED ORDER — SODIUM PHOSPHATES 45 MMOLE/15ML IV SOLN
15.0000 mmol | Freq: Once | INTRAVENOUS | Status: AC
  Administered 2022-12-05: 15 mmol via INTRAVENOUS
  Filled 2022-12-05: qty 5

## 2022-12-05 MED ORDER — PROSOURCE TF20 ENFIT COMPATIBL EN LIQD: 60.0000 mL | Freq: Two times a day (BID) | ENTERAL | Status: DC

## 2022-12-05 NOTE — Progress Notes (Signed)
Pharmacy Antibiotic Note  Mark Yu is a 66 y.o. male admitted on 11/27/2022 with multifocal pneumonia per Chest CT.  Pharmacy has been consulted for Cefepime dosing.  12/05/2022 Day # 5 cefepime, Day# 3/5 doxy WBC down to WNL, SCr WNL, Afebrile  Plan: Continue Cefepime 2gm IV q8h Doxycycline 100 mg IV q12 per MD Monitor renal function and cx data  F/u length of therapy/stop date  Height: 5\' 11"  (180.3 cm) Weight: 47.6 kg (104 lb 15 oz) IBW/kg (Calculated) : 75.3  Temp (24hrs), Avg:98.2 F (36.8 C), Min:97.7 F (36.5 C), Max:98.8 F (37.1 C)  Recent Labs  Lab 12/11/2022 1428 12/02/22 0540 12/03/22 0629 12/04/22 0856 12/05/22 0746  WBC 13.6* 10.3 6.8 14.2* 7.5  CREATININE 0.69 0.56* 0.51* 0.86 0.67     Estimated Creatinine Clearance: 62 mL/min (by C-G formula based on SCr of 0.67 mg/dL).    Allergies  Allergen Reactions   Amoxicillin Rash   Macrolides And Ketolides Other (See Comments)    Reaction not recalled   Antimicrobials this admission: 12/5 Cefepime >>  12/5 Vancomycin >> 12/7 12/7 Doxycycline >> 12/11 Microbiology results: 12/5 BCx: ngtd 12/5 Resp PCR: negative for infulenza A/B & Covid 12/6 MRSA PCR: negative  12/5 legionella neg 12/5 strep pneumo neg   Thank you for allowing pharmacy to be a part of this patient's care.  Eudelia Bunch, Pharm.D Use secure chat for questions 12/05/2022 9:16 AM

## 2022-12-05 NOTE — Progress Notes (Signed)
Pharmacy: electrolyte replacement Phos 2.1, Na 139, K 4.1  Plan: NaPhos 15 mMol per E link protocol  Eudelia Bunch, Pharm.D Use secure chat for questions 12/05/2022 9:12 AM

## 2022-12-05 NOTE — Progress Notes (Signed)
PT Cancellation Note  Patient Details Name: Mark Yu MRN: [id number] DOB: Aug 09, 1956   Cancelled Treatment:     PT order received but eval deferred.  Pt transferred to SDU and now on vent.  Will follow.   Kenny Stern 12/05/2022, 7:07 AM

## 2022-12-05 NOTE — Progress Notes (Signed)
NAME:  Mark Yu, MRN:  [id number], DOB:  11-21-1956, LOS: 3 ADMISSION DATE:  12/24/2022, CONSULTATION DATE: 12/04/2022 REFERRING MD: Dr. Grandville Silos, CHIEF COMPLAINT: Dyspnea  History of Present Illness:  66yM with extensive stage small cell on 3rd line chemo with progression f/b Dr. Julien Nordmann, COPD, AF, g-tube, who presented 12/5 with 4 days of increased cough, dyspnea. He was admitted for acute hypoxic, chronic hypercapnic respiratory failure due to multifocal pneumonia and was started on vanc, cefepime and steroids. Unfortunately deteriorated over the course of the night 12/03/22 requiring continuous NIV. PCCM consulted overnight 12/04/22 for increased WOB, lethargy despite NIV.    Pertinent  Medical History  Extensive stage small cell lung ca COPD AF Chronic hypoxic hypercapnic respiratory failure Tracheostomy and decannulation 04/15/22  Significant Hospital Events: Including procedures, antibiotic start and stop dates in addition to other pertinent events   12/5-admitted for respiratory failure 12/7-12/8-decompensated, intubated 12/8  Interim History / Subjective:  On vent, comfortable  Objective   Blood pressure 103/75, pulse (!) 114, temperature 97.7 F (36.5 C), temperature source Axillary, resp. rate 13, height 5\' 11"  (1.803 m), weight 47.6 kg, SpO2 96 %.    Vent Mode: PRVC FiO2 (%):  [28 %-30 %] 28 % Set Rate:  [15 bmp] 15 bmp Vt Set:  [600 mL] 600 mL PEEP:  [5 cmH20] 5 cmH20 Plateau Pressure:  [14 cmH20-16 cmH20] 15 cmH20   Intake/Output Summary (Last 24 hours) at 12/05/2022 1143 Last data filed at 12/05/2022 0630 Gross per 24 hour  Intake 1419.5 ml  Output 895 ml  Net 524.5 ml   Filed Weights   12/02/22 1447  Weight: 47.6 kg    Examination: General: Middle-age, frail HENT: Endotracheal tube in place Lungs: Decreased air entry bilaterally Cardiovascular: S1-S2 appreciated Abdomen: Soft, bowel sounds appreciated Extremities: No clubbing, no edema Neuro:  Sedated GU:   Resolved Hospital Problem list     Assessment & Plan:  Acute on chronic hypoxemic/hypercapnic respiratory failure Acute exacerbation of chronic obstructive pulmonary disease -Continue full ventilator support -Will plan weaning as tolerated -VAP in place -Continue steroids -Continue bronchodilators  Acute metabolic encephalopathy -Continue to monitor closely  Agitation and sedation management -Continue propofol -As needed fentanyl -Goal RASS -1  Community-acquired pneumonia with multidrug-resistant risk factors -Continue current antibiotic therapy -Continue cefepime and doxycycline  Advanced stage small cell lung cancer with progression  Acute urinary retention -Continue Foley  Paroxysmal atrial fibrillation -Monitor  Best Practice (right click and "Reselect all SmartList Selections" daily)   Diet/type: tubefeeds DVT prophylaxis: LMWH GI prophylaxis: PPI Lines: N/A, has port Foley:  Yes, and it is still needed Code Status:  DNR Last date of multidisciplinary goals of care discussion    Labs   CBC: Recent Labs  Lab 12/14/2022 1428 12/02/22 0540 12/03/22 0629 12/04/22 0856 12/05/22 0746  WBC 13.6* 10.3 6.8 14.2* 7.5  NEUTROABS 12.8*  --  5.9 13.4*  --   HGB 11.2* 10.1* 8.9* 9.8* 9.0*  HCT 35.6* 31.9* 28.6* 32.0* 29.3*  MCV 100.3* 100.3* 100.7* 101.3* 100.0  PLT 211 202 156 225 329    Basic Metabolic Panel: Recent Labs  Lab 12/10/2022 1428 12/02/22 0540 12/03/22 0629 12/04/22 0856 12/05/22 0746  NA 138 136 139 140 139  K 3.7 3.4* 3.4* 5.0 4.1  CL 99 97* 101 102 103  CO2 29 30 27 23 28   GLUCOSE 112* 84 91 113* 117*  BUN 16 13 11  26* 26*  CREATININE 0.69 0.56* 0.51* 0.86 0.67  CALCIUM 9.2  8.7* 7.9* 8.5* 8.1*  MG  --  2.1 1.8 3.2* 2.4  PHOS  --   --   --   --  2.1*   GFR: Estimated Creatinine Clearance: 62 mL/min (by C-G formula based on SCr of 0.67 mg/dL). Recent Labs  Lab 12/02/22 0540 12/03/22 0629 12/04/22 0856  12/05/22 0746  WBC 10.3 6.8 14.2* 7.5    Liver Function Tests: Recent Labs  Lab 11/27/2022 1428 12/05/22 0746  AST 20 87*  ALT 17 105*  ALKPHOS 109 74  BILITOT 0.8 0.6  PROT 7.0 5.6*  ALBUMIN 4.0 2.6*   No results for input(s): "LIPASE", "AMYLASE" in the last 168 hours. No results for input(s): "AMMONIA" in the last 168 hours.  ABG    Component Value Date/Time   PHART 7.26 (L) 12/04/2022 0330   PCO2ART 70 (HH) 12/04/2022 0330   PO2ART 81 (L) 12/04/2022 0330   HCO3 26.4 12/04/2022 0856   ACIDBASEDEF 0.2 12/04/2022 0856   O2SAT 63.8 12/04/2022 0856     Coagulation Profile: No results for input(s): "INR", "PROTIME" in the last 168 hours.  Cardiac Enzymes: No results for input(s): "CKTOTAL", "CKMB", "CKMBINDEX", "TROPONINI" in the last 168 hours.  HbA1C: No results found for: "HGBA1C"  CBG: Recent Labs  Lab 12/04/22 1523 12/04/22 1925 12/04/22 2307 12/05/22 0309 12/05/22 0753  GLUCAP 158* 192* 156* 128* 115*    Review of Systems:   Sedated  Past Medical History:  He,  has a past medical history of Anxiety, COPD (chronic obstructive pulmonary disease) ([place]), Depression, Dyspnea, HTN (hypertension), and Hyponatremia (03/08/2022).   Surgical History:   Past Surgical History:  Procedure Laterality Date   BRONCHIAL BIOPSY  02/02/2022   Procedure: BRONCHIAL BIOPSIES;  Surgeon: Collene Gobble, MD;  Location: [facility] ENDOSCOPY;  Service: Pulmonary;;   BRONCHIAL BRUSHINGS  02/02/2022   Procedure: BRONCHIAL BRUSHINGS;  Surgeon: Collene Gobble, MD;  Location: [facility] ENDOSCOPY;  Service: Pulmonary;;   BRONCHIAL NEEDLE ASPIRATION BIOPSY  02/02/2022   Procedure: BRONCHIAL NEEDLE ASPIRATION BIOPSIES;  Surgeon: Collene Gobble, MD;  Location: [facility] ENDOSCOPY;  Service: Pulmonary;;   IR GASTROSTOMY TUBE MOD SED  04/03/2022   IR GASTROSTOMY TUBE REMOVAL  11/04/2022   IR IMAGING GUIDED PORT INSERTION  02/24/2022   IR PATIENT EVAL TECH 0-60 MINS  12/27/2022   MRI     NO PAST SURGERIES      VIDEO BRONCHOSCOPY WITH ENDOBRONCHIAL ULTRASOUND N/A 02/02/2022   Procedure: VIDEO BRONCHOSCOPY WITH ENDOBRONCHIAL ULTRASOUND;  Surgeon: Collene Gobble, MD;  Location: [facility] ENDOSCOPY;  Service: Pulmonary;  Laterality: N/A;   VIDEO BRONCHOSCOPY WITH RADIAL ENDOBRONCHIAL ULTRASOUND  02/02/2022   Procedure: VIDEO BRONCHOSCOPY WITH RADIAL ENDOBRONCHIAL ULTRASOUND;  Surgeon: Collene Gobble, MD;  Location: [place] ENDOSCOPY;  Service: Pulmonary;;     Social History:   reports that he quit smoking about 8 months ago. His smoking use included cigarettes. He has a 12.50 pack-year smoking history. He does not have any smokeless tobacco history on file. He reports current alcohol use of about 30.0 standard drinks of alcohol per week. He reports current drug use. Drug: Marijuana.   Family History:  His family history includes Alzheimer's disease in his mother; Multiple sclerosis in his father.   Allergies Allergies  Allergen Reactions   Amoxicillin Rash   Macrolides And Ketolides Other (See Comments)    Reaction not recalled    The patient is critically ill with multiple organ systems failure and requires high complexity decision making for assessment and  support, frequent evaluation and titration of therapies, application of advanced monitoring technologies and extensive interpretation of multiple databases. Critical Care Time devoted to patient care services described in this note independent of APP/resident time (if applicable)  is 32 minutes.   Sherrilyn Rist MD [facility] Personal pager: See Amion If unanswered, please page CCM On-call: [phone]

## 2022-12-05 NOTE — Progress Notes (Signed)
OT Cancellation Note  Patient Details Name: Joandy Burget MRN: [id number] DOB: 04/20/1956   Cancelled Treatment:    Reason Eval/Treat Not Completed: Medical issues which prohibited therapy Patient is currently on vent with nurse asking for therapy to hold off at this time. OT to continue to follow and check back as schedule will allow.  Rennie Plowman, MS Acute Rehabilitation Department Office# [phone]  12/05/2022, 1:21 PM

## 2022-12-05 NOTE — Progress Notes (Addendum)
Nutrition Brief Note   RD team received a consult to start enteral nutrition. Please see full RD assessment note from 12/04/22. Pt remains on ventilator support, OG tube terminated in the stomach. Labs and medication reviewed.   Tube Feeds via OG - Vital 1.5 at 30 mL/hr, start at 10 mL and advance by 5 mL every 24 hours - 60 mL ProSource TF 20 - BID - Provides 1240 kcal, 89 gm protein, and 550 mL free water daily.   Monitor magnesium, potassium, and phosphorus BID for at least 3 days, MD to replete as needed, as pt is at high risk for refeeding syndrome given severe malnutrition.  Recommend starting 100 mg Thiamine daily due to risk of refeeding. Reached out to MD.   Addendum: RN informed RD that pt previous G-tube site stoma has not closed and that anything put into OG-tube comes out of the stoma. RD to discontinue tube feed orders until further notice from MD.  RD team will continue to follow.  Hermina Barters RD, LDN Clinical Dietitian See Shea Evans for contact information.

## 2022-12-05 NOTE — Progress Notes (Signed)
No SBT attempt today per RN via CCM.

## 2022-12-05 NOTE — Progress Notes (Addendum)
After giving Pt medications per NG tube, this RN found pt to be leaking stomach contents through his gastric tube ostomy, MD made aware. Holding tube medications for now. Ventura Bruns, RN

## 2022-12-05 NOTE — Progress Notes (Signed)
Replaced ETT holder- uneventful. Bilateral skin on cheeks are red but skin is intact completely at this time. Both area were gently washed and dries with warm wet cloth. RN aware.

## 2022-12-05 NOTE — Progress Notes (Signed)
Daily Progress Note   Patient Name: Mark Yu       Date: 12/05/2022 DOB: Jul 13, 1956  Age: 66 y.o. MRN#: [id number] Attending Physician: Maryjane Hurter, MD Primary Care Physician: Jennette Dubin, NP Admit Date: 12/21/2022 Length of Stay: 3 days  Reason for Consultation/Follow-up: Establishing goals of care  Subjective:   CC: Patient remains intubated in ICU and unable to interact.  Following up regarding complex medical decision making.  Subjective:  Review of EMR prior to seeing patient this morning.  Also able to discuss care with bedside RN for medical updates.  Initially presenting to bedside in the a.m., no family present.  Patient remains intubated and sedated in bed.  Remains in mittens due to agitation.  No longer requiring pressor support at this moment.  Informed later in the afternoon that patient's sister, [name], had presented to bedside.  Able to present and discuss medical updates.  [name] was able to update this provider that she talked further with patient's significant other yesterday evening and this morning.  Patient significant other, Demetra Shiner, is feeling better at this time and is hopeful to come in to visit patient soon.  [name] explained that through their discussions Demetra Shiner has acknowledged how ill the patient is and that this may be the end of his life though she is understandably grieving this and upset.  Then discussed that this would not be quality of life to the patient.  After discussions, able to plan for meeting on Sunday, 2022/12/07, with Liana Gerold. Demetra Shiner has stated she needs to see the patient at least 1 more time.  Will plan to have discussions regarding medical care moving forward at meeting tomorrow. Did discuss with [name] that based on current situation, goal should still be to focus on patient's comfort whether or not he can get off the ventilator in a "safe" manner.  [name] agreeing with this.  Again will not escalate level of care  and will continue current interventions.  All questions answered at that time.  Review of Systems Unable to obtain due to current medical status. Objective:   Vital Signs:  BP 118/70   Pulse (!) 106   Temp 97.7 F (36.5 C) (Axillary)   Resp (!) 0   Ht 5\' 11"  (1.803 m)   Wt 47.6 kg   SpO2 99%   BMI 14.64 kg/m   Physical Exam: General: Intubated, sedated, frail, cachectic, ill-appearing Eyes: No drainage noted HENT: ETT in place Cardiovascular: RRR, no edema in LE b/l Respiratory: Intubated on ventilator support  Abdomen: not distended Extremities: Muscle wasting present Skin: Ecchymoses on upper extremities bilaterally Neuro: Intubated and sedated Psych: Unable to assess at this time  Imaging: I personally reviewed recent imaging.   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  Progression of patient's cancer has been seen on recent imaging.  During this admission patient found to have acute hypoxic respiratory failure secondary to multifocal pneumonia in the setting of metastatic lung cancer; receiving appropriate management for this.  Patient had recent visit with oncology on 11/26/2022 discussing medical care moving forward to which patient elected to proceed with cancer directed therapies.  Patient seen by palliative medicine team previously.  Palliative medicine team consulted to assist with complex medical decision making and symptom  management.   # Complex medical decision making/goals of care                -Patient unable to participate in complex medical decision making today due to deterioration in medical status requiring intubation and ventilator support.    -Spoke with patient's sister, [name], at bedside today.  She has had  continued conversations with patient's significant other, [name]. [name] voiced to [place] hearing how seriously ill the patient is at this moment and knows he may be at the end of his life. [name] hoping to at least see the patient 1 more time.  Planning for family meeting with Everlena Cooper, and this provider on Sunday, 01/05/2023 at 11 AM to further discuss medical care moving forward.  -Continue current level of medical interventions with hope that patient can be extubated.   -If patient should further deteriorate, [name] should be informed of this.  Previously discussed  how further escalation of inventions such as adding more pressors would not change patient's underlying condition/cancer and would only prolong suffering. Demetra Shiner had acknowledged and agreed with this.    Code Status: DNR   # Symptom management           -As per primary team. Appears agitated, receiving prn fentanyl.   # Psycho-social/Spiritual Support:  - Support System: Significant other of 21 years, sisters  Discussed with: Patient's sister, bedside RN  Thank you for allowing the palliative care team to participate in the care Faustino Congress.  Chelsea Aus, DO Palliative Care Provider PMT # [phone]  If patient remains symptomatic despite maximum doses, please call PMT at [phone] between 0700 and 1900. Outside of these hours, please call attending, as PMT does not have night coverage.  This provider spent a total of 38 minutes providing patient's care.  Includes review of EMR, discussing care with other staff members involved in patient's medical care, obtaining relevant history and information from patient and/or patient's family, and personal review of imaging and lab work. Greater than 50% of the time was spent counseling and coordinating care related to the above assessment and plan.

## 2022-12-06 DIAGNOSIS — Z515 Encounter for palliative care: Secondary | ICD-10-CM | POA: Diagnosis not present

## 2022-12-06 DIAGNOSIS — J9601 Acute respiratory failure with hypoxia: Secondary | ICD-10-CM | POA: Diagnosis not present

## 2022-12-06 DIAGNOSIS — J441 Chronic obstructive pulmonary disease with (acute) exacerbation: Secondary | ICD-10-CM | POA: Diagnosis not present

## 2022-12-06 DIAGNOSIS — J189 Pneumonia, unspecified organism: Secondary | ICD-10-CM | POA: Diagnosis not present

## 2022-12-06 LAB — MAGNESIUM: Magnesium: 2 mg/dL (ref 1.7–2.4)

## 2022-12-06 LAB — CBC
HCT: 26.5 % — ABNORMAL LOW (ref 39.0–52.0)
Hemoglobin: 8 g/dL — ABNORMAL LOW (ref 13.0–17.0)
MCH: 30.8 pg (ref 26.0–34.0)
MCHC: 30.2 g/dL (ref 30.0–36.0)
MCV: 101.9 fL — ABNORMAL HIGH (ref 80.0–100.0)
Platelets: 179 10*3/uL (ref 150–400)
RBC: 2.6 MIL/uL — ABNORMAL LOW (ref 4.22–5.81)
RDW: 17.1 % — ABNORMAL HIGH (ref 11.5–15.5)
WBC: 5.9 10*3/uL (ref 4.0–10.5)
nRBC: 0 % (ref 0.0–0.2)

## 2022-12-06 LAB — BASIC METABOLIC PANEL
Anion gap: 6 (ref 5–15)
BUN: 27 mg/dL — ABNORMAL HIGH (ref 8–23)
CO2: 28 mmol/L (ref 22–32)
Calcium: 8.1 mg/dL — ABNORMAL LOW (ref 8.9–10.3)
Chloride: 106 mmol/L (ref 98–111)
Creatinine, Ser: 0.53 mg/dL — ABNORMAL LOW (ref 0.61–1.24)
GFR, Estimated: >60 mL/min (ref >60)
Glucose, Bld: 101 mg/dL — ABNORMAL HIGH (ref 70–99)
Potassium: 3.7 mmol/L (ref 3.5–5.1)
Sodium: 140 mmol/L (ref 135–145)

## 2022-12-06 LAB — CULTURE, BLOOD (ROUTINE X 2)
Culture: NO GROWTH
Culture: NO GROWTH

## 2022-12-06 LAB — GLUCOSE, CAPILLARY
Glucose-Capillary: 103 mg/dL — ABNORMAL HIGH (ref 70–99)
Glucose-Capillary: 104 mg/dL — ABNORMAL HIGH (ref 70–99)
Glucose-Capillary: 92 mg/dL (ref 70–99)
Glucose-Capillary: 105 mg/dL — ABNORMAL HIGH (ref 70–99)

## 2022-12-06 LAB — PHOSPHORUS: Phosphorus: 2 mg/dL — ABNORMAL LOW (ref 2.5–4.6)

## 2022-12-06 MED ORDER — ACETAMINOPHEN 650 MG RE SUPP: 650.0000 mg | Freq: Four times a day (QID) | RECTAL | Status: DC | PRN

## 2022-12-06 MED ORDER — POTASSIUM PHOSPHATES 15 MMOLE/5ML IV SOLN
15.0000 mmol | Freq: Once | INTRAVENOUS | Status: DC
  Administered 2022-12-06: 15 mmol via INTRAVENOUS
  Filled 2022-12-06: qty 5

## 2022-12-06 MED ORDER — POLYVINYL ALCOHOL 1.4 % OP SOLN: 1.0000 [drp] | Freq: Four times a day (QID) | OPHTHALMIC | Status: DC | PRN

## 2022-12-06 MED ORDER — GLYCOPYRROLATE 0.2 MG/ML IJ SOLN: 0.2000 mg | INTRAMUSCULAR | Status: DC | PRN

## 2022-12-06 MED ORDER — SODIUM CHLORIDE 0.9 % IV SOLN: INTRAVENOUS | Status: DC

## 2022-12-06 MED ORDER — GLYCOPYRROLATE 0.2 MG/ML IJ SOLN
0.2000 mg | INTRAMUSCULAR | Status: DC | PRN
  Administered 2022-12-06 (×2): 0.2 mg via INTRAVENOUS
  Filled 2022-12-06 (×2): qty 1

## 2022-12-06 MED ORDER — MORPHINE 100MG IN NS 100ML (1MG/ML) PREMIX INFUSION
0.0000 mg/h | INTRAVENOUS | Status: DC
  Administered 2022-12-06: 2 mg/h via INTRAVENOUS
  Filled 2022-12-06: qty 100

## 2022-12-06 MED ORDER — POTASSIUM CHLORIDE 10 MEQ/50ML IV SOLN
10.0000 meq | INTRAVENOUS | Status: DC
  Administered 2022-12-06: 10 meq via INTRAVENOUS
  Filled 2022-12-06 (×2): qty 50

## 2022-12-06 MED ORDER — GLYCOPYRROLATE 1 MG PO TABS: 1.0000 mg | ORAL_TABLET | ORAL | Status: DC | PRN

## 2022-12-06 MED ORDER — ACETAMINOPHEN 325 MG PO TABS: 650.0000 mg | ORAL_TABLET | Freq: Four times a day (QID) | ORAL | Status: DC | PRN

## 2022-12-06 MED ORDER — MORPHINE BOLUS VIA INFUSION
5.0000 mg | INTRAVENOUS | Status: DC | PRN
  Administered 2022-12-06 (×2): 5 mg via INTRAVENOUS

## 2022-12-06 MED ORDER — LORAZEPAM 2 MG/ML IJ SOLN: 2.0000 mg | INTRAMUSCULAR | Status: DC | PRN

## 2022-12-08 ENCOUNTER — Ambulatory Visit: Payer: Medicare Other

## 2022-12-08 ENCOUNTER — Ambulatory Visit: Payer: Medicare Other | Admitting: Physician Assistant

## 2022-12-08 ENCOUNTER — Other Ambulatory Visit: Payer: Medicare Other

## 2022-12-15 ENCOUNTER — Ambulatory Visit: Payer: Medicare Other | Admitting: Physician Assistant

## 2022-12-15 ENCOUNTER — Ambulatory Visit: Payer: Medicare Other | Admitting: Internal Medicine

## 2022-12-15 ENCOUNTER — Ambulatory Visit: Payer: Medicare Other

## 2022-12-15 ENCOUNTER — Other Ambulatory Visit: Payer: Medicare Other

## 2022-12-17 ENCOUNTER — Other Ambulatory Visit: Payer: Medicare Other

## 2022-12-17 ENCOUNTER — Ambulatory Visit: Payer: Medicare Other

## 2022-12-17 ENCOUNTER — Encounter: Payer: Medicare Other | Admitting: Nutrition

## 2022-12-17 ENCOUNTER — Ambulatory Visit: Payer: Medicare Other | Admitting: Physician Assistant

## 2022-12-22 ENCOUNTER — Other Ambulatory Visit: Payer: Medicare Other

## 2022-12-22 ENCOUNTER — Ambulatory Visit: Payer: Medicare Other | Admitting: Physician Assistant

## 2022-12-22 ENCOUNTER — Ambulatory Visit: Payer: Medicare Other

## 2022-12-24 ENCOUNTER — Ambulatory Visit: Payer: Medicare Other

## 2022-12-24 ENCOUNTER — Other Ambulatory Visit: Payer: Medicare Other

## 2022-12-28 NOTE — Progress Notes (Signed)
NAME:  Mark Yu, MRN:  [id number], DOB:  May 11, 1956, LOS: 4 ADMISSION DATE:  12/07/2022, CONSULTATION DATE: 12/04/2022 REFERRING MD: Dr. Grandville Silos, CHIEF COMPLAINT: Dyspnea  History of Present Illness:  66yM with extensive stage small cell on 3rd line chemo with progression f/b Dr. Julien Nordmann, COPD, AF, g-tube, who presented 12/5 with 4 days of increased cough, dyspnea. He was admitted for acute hypoxic, chronic hypercapnic respiratory failure due to multifocal pneumonia and was started on vanc, cefepime and steroids. Unfortunately deteriorated over the course of the night 12/03/22 requiring continuous NIV. PCCM consulted overnight 12/04/22 for increased WOB, lethargy despite NIV.    Pertinent  Medical History  Extensive stage small cell lung ca COPD AF Chronic hypoxic hypercapnic respiratory failure Tracheostomy and decannulation 04/15/22  Significant Hospital Events: Including procedures, antibiotic start and stop dates in addition to other pertinent events   12/5-admitted for respiratory failure 12/7-12/8-decompensated, intubated 12/8  Interim History / Subjective:  On vent, comfortable No overnight events  Objective   Blood pressure (!) 145/85, pulse (!) 103, temperature 98.5 F (36.9 C), temperature source Axillary, resp. rate 15, height 5\' 11"  (1.803 m), weight 47.6 kg, SpO2 96 %.    Vent Mode: PSV;CPAP FiO2 (%):  [28 %] 28 % Set Rate:  [15 bmp] 15 bmp Vt Set:  [600 mL] 600 mL PEEP:  [5 cmH20] 5 cmH20 Pressure Support:  [12 cmH20] 12 cmH20 Plateau Pressure:  [15 cmH20-17 cmH20] 15 cmH20   Intake/Output Summary (Last 24 hours) at 12-18-2022 1000 Last data filed at December 18, 2022 0800 Gross per 24 hour  Intake 1351.5 ml  Output 300 ml  Net 1051.5 ml   Filed Weights   12/02/22 1447  Weight: 47.6 kg    Examination: General: Middle-age, frail HENT: Endotracheal tube in place Lungs: Decreased air movement bilaterally Cardiovascular: S1-S2 appreciated Abdomen: Soft,  bowel sounds appreciated Extremities: No clubbing, no edema Neuro: Sedated GU:   Resolved Hospital Problem list     Assessment & Plan:   Acute on chronic hypoxemic/hypercapnic respiratory failure Acute exacerbation of chronic obstructive pulmonary disease -Continue full vent support -Weaning as tolerated -VAP in place -Continue bronchodilators -Continue steroids  Acute metabolic encephalopathy -Continue to monitor  Agitation and sedation management -Continue propofol, as needed fentanyl -Wean sedation today to try and assess for extubation  Community-acquired pneumonia with multidrug-resistant risk factors -Continue current antibiotic therapy -On cefepime and doxycycline  Advanced stage small cell lung cancer with progression  Urinary retention -Continue Foley management  Paroxysmal atrial fibrillation -Continue to monitor  Best Practice (right click and "Reselect all SmartList Selections" daily)   Diet/type: tubefeeds DVT prophylaxis: LMWH GI prophylaxis: PPI Lines: N/A, has port Foley:  Yes, and it is still needed Code Status:  DNR Last date of multidisciplinary goals of care discussion    Labs   CBC: Recent Labs  Lab 12/27/2022 1428 12/02/22 0540 12/03/22 0629 12/04/22 0856 12/05/22 0746  WBC 13.6* 10.3 6.8 14.2* 7.5  NEUTROABS 12.8*  --  5.9 13.4*  --   HGB 11.2* 10.1* 8.9* 9.8* 9.0*  HCT 35.6* 31.9* 28.6* 32.0* 29.3*  MCV 100.3* 100.3* 100.7* 101.3* 100.0  PLT 211 202 156 225 010    Basic Metabolic Panel: Recent Labs  Lab 12/02/22 0540 12/03/22 0629 12/04/22 0856 12/05/22 0746 12/05/22 1650 2022-12-18 0422 2022/12/18 0558  NA 136 139 140 139  --   --  140  K 3.4* 3.4* 5.0 4.1  --   --  3.7  CL 97* 101 102 103  --   --  106  CO2 30 27 23 28   --   --  28  GLUCOSE 84 91 113* 117*  --   --  101*  BUN 13 11 26* 26*  --   --  27*  CREATININE 0.56* 0.51* 0.86 0.67  --   --  0.53*  CALCIUM 8.7* 7.9* 8.5* 8.1*  --   --  8.1*  MG 2.1 1.8 3.2*  2.4 2.2 2.0  --   PHOS  --   --   --  2.1* 2.9 2.0*  --    GFR: Estimated Creatinine Clearance: 62 mL/min (A) (by C-G formula based on SCr of 0.53 mg/dL (L)). Recent Labs  Lab 12/02/22 0540 12/03/22 0629 12/04/22 0856 12/05/22 0746  WBC 10.3 6.8 14.2* 7.5    Liver Function Tests: Recent Labs  Lab 12/05/2022 1428 12/05/22 0746  AST 20 87*  ALT 17 105*  ALKPHOS 109 74  BILITOT 0.8 0.6  PROT 7.0 5.6*  ALBUMIN 4.0 2.6*   No results for input(s): "LIPASE", "AMYLASE" in the last 168 hours. No results for input(s): "AMMONIA" in the last 168 hours.  ABG    Component Value Date/Time   PHART 7.26 (L) 12/04/2022 0330   PCO2ART 70 (HH) 12/04/2022 0330   PO2ART 81 (L) 12/04/2022 0330   HCO3 26.4 12/04/2022 0856   ACIDBASEDEF 0.2 12/04/2022 0856   O2SAT 63.8 12/04/2022 0856     Coagulation Profile: No results for input(s): "INR", "PROTIME" in the last 168 hours.  Cardiac Enzymes: No results for input(s): "CKTOTAL", "CKMB", "CKMBINDEX", "TROPONINI" in the last 168 hours.  HbA1C: No results found for: "HGBA1C"  CBG: Recent Labs  Lab 12/05/22 2007 12/05/22 2320 29-Dec-2022 0344 29-Dec-2022 0357 12-29-2022 0713  GLUCAP 123* 97 104* 103* 92    Review of Systems:   Sedated  Past Medical History:  He,  has a past medical history of Anxiety, COPD (chronic obstructive pulmonary disease) ([place]), Depression, Dyspnea, HTN (hypertension), and Hyponatremia (03/08/2022).   Surgical History:   Past Surgical History:  Procedure Laterality Date   BRONCHIAL BIOPSY  02/02/2022   Procedure: BRONCHIAL BIOPSIES;  Surgeon: Collene Gobble, MD;  Location[facility] ENDOSCOPY;  Service: Pulmonary;;   BRONCHIAL BRUSHINGS  02/02/2022   Procedure: BRONCHIAL BRUSHINGS;  Surgeon: Collene Gobble, MD;  Location: [facility] ENDOSCOPY;  Service: Pulmonary;;   BRONCHIAL NEEDLE ASPIRATION BIOPSY  02/02/2022   Procedure: BRONCHIAL NEEDLE ASPIRATION BIOPSIES;  Surgeon: Collene Gobble, MD;  Location: [facility] ENDOSCOPY;  Service:  Pulmonary;;   IR GASTROSTOMY TUBE MOD SED  04/03/2022   IR GASTROSTOMY TUBE REMOVAL  11/04/2022   IR IMAGING GUIDED PORT INSERTION  02/24/2022   IR PATIENT EVAL TECH 0-60 MINS  12/25/2022   MRI     NO PAST SURGERIES     VIDEO BRONCHOSCOPY WITH ENDOBRONCHIAL ULTRASOUND N/A 02/02/2022   Procedure: VIDEO BRONCHOSCOPY WITH ENDOBRONCHIAL ULTRASOUND;  Surgeon: Collene Gobble, MD;  Location: [facility] ENDOSCOPY;  Service: Pulmonary;  Laterality: N/A;   VIDEO BRONCHOSCOPY WITH RADIAL ENDOBRONCHIAL ULTRASOUND  02/02/2022   Procedure: VIDEO BRONCHOSCOPY WITH RADIAL ENDOBRONCHIAL ULTRASOUND;  Surgeon: Collene Gobble, MD;  Location: [place] ENDOSCOPY;  Service: Pulmonary;;     Social History:   reports that he quit smoking about 8 months ago. His smoking use included cigarettes. He has a 12.50 pack-year smoking history. He does not have any smokeless tobacco history on file. He reports current alcohol use of about 30.0 standard drinks of alcohol per week. He reports current drug use.  Drug: Marijuana.   Family History:  His family history includes Alzheimer's disease in his mother; Multiple sclerosis in his father.   Allergies Allergies  Allergen Reactions   Amoxicillin Rash   Macrolides And Ketolides Other (See Comments)    Reaction not recalled    The patient is critically ill with multiple organ systems failure and requires high complexity decision making for assessment and support, frequent evaluation and titration of therapies, application of advanced monitoring technologies and extensive interpretation of multiple databases. Critical Care Time devoted to patient care services described in this note independent of APP/resident time (if applicable)  is 32 minutes.   Sherrilyn Rist MD [facility] Personal pager: See Amion If unanswered, please page CCM On-call: [phone]

## 2022-12-28 NOTE — Death Summary Note (Signed)
DEATH SUMMARY   Patient Details  Name: Mark Yu MRN: [id number] DOB: 1956/10/05  Admission/Discharge Information   Admit Date:  2022-12-18  Date of Death: Date of Death: 12-23-22  Time of Death: Time of Death: 02/27/2114  Length of Stay: 4  Referring Physician: Jennette Dubin, NP   Reason(s) for Hospitalization  Admitted 12-19-23 with shortness of breath, underlying history of metastatic small cell lung cancer, chronic obstructive pulmonary disease, presented to the hospital worsening shortness of breath  Diagnoses  Preliminary cause of death:  Pneumonia Small cell lung cancer Secondary Diagnoses (including complications and co-morbidities):  Principal Problem:   Acute hypoxic respiratory failure ([place]) Active Problems:   COPD with acute exacerbation ([place])   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 ([place])   Essential hypertension   PAF (paroxysmal atrial fibrillation) ([place])   Metastases to the liver [facility])   Multifocal pneumonia   T9 vertebral fracture ([place])   Wound cellulitis   Acute urinary retention   Indwelling Foley catheter present   Need for emotional support   High risk medication use   Anorexia   Goals of care, counseling/discussion   Palliative care encounter   DNR (do not resuscitate)   Counseling and coordination of care   Brief Hospital Course (including significant findings, care, treatment, and services provided and events leading to death)  Mark Yu is a 67 y.o. year old male who known to have small cell lung cancer on chemotherapy, came into the hospital worsening shortness of breath Was found to have multilobar pneumonia.  Was started on antibiotic therapy.  Unfortunately continued to deteriorate and got exhausted with his breathing with increased work of breathing, lethargy despite noninvasive ventilator use, requiring to be placed on a ventilator. Aggressive course of treatment continued and goals of care discussion initiated with  patient's loved ones, with weaning patient also was able to participate in discussions Palliative care was consulted to assist with management  Patient chose to withdraw ongoing aggressive care and be kept comfortable.  Patient succumbed to his illness on 23-Dec-2022 at 02/27/14 hrs.   Pertinent Labs and Studies  Significant Diagnostic Studies DG Abd 1 View  Result Date: 12/04/2022 CLINICAL DATA:  NG tube placement EXAM: ABDOMEN - 1 VIEW COMPARISON:  03/31/22 FINDINGS: Interval replacement of the non weighted enteric tube with a weighted enteric tube with the tip projecting over the proximal stomach. No pleural effusion. No pneumothorax. Unchanged cardiac and mediastinal contours. Focal airspace opacity. No displaced rib fractures. IMPRESSION: Interval replacement of the non weighted enteric tube with a weighted enteric tube with the tip projecting over the proximal stomach. Electronically Signed   By: Marin Roberts M.D.   On: 12/04/2022 13:16   DG CHEST PORT 1 VIEW  Result Date: 12/04/2022 CLINICAL DATA:  Status post intubation. EXAM: PORTABLE CHEST 1 VIEW COMPARISON:  12/04/2022 FINDINGS: Endotracheal tube with the tip 5.1 cm above the carina. Nasogastric tube coursing below the diaphragm with the proximal port above the esophagogastric junction; recommend advancing the nasogastric tube 10 cm. Right-sided Port-A-Cath with the tip projecting over the SVC. Diffuse bilateral mild interstitial thickening. Lungs are hyperinflated as can be seen with COPD. Stable partially visualized right upper lobe pulmonary nodule measuring approximately 14 mm better seen on the CT chest dated 2022/12/18. No pleural effusion or pneumothorax. Heart and mediastinal contours are unremarkable. No acute osseous abnormality. IMPRESSION: 1. Endotracheal tube with the tip 5.1 cm above the carina. 2. Nasogastric tube coursing below the diaphragm with  the proximal port above the esophagogastric junction; recommend advancing the  nasogastric tube 10 cm. Electronically Signed   By: Kathreen Devoid M.D.   On: 12/04/2022 09:12   DG Chest Port 1 View  Result Date: 12/04/2022 CLINICAL DATA:  Dyspnea EXAM: PORTABLE CHEST 1 VIEW COMPARISON:  11/30/2022 FINDINGS: The lungs are hyperinflated in keeping with changes of underlying COPD. Right suprahilar nodule is again identified, not significantly changed since prior examination. No superimposed focal pulmonary infiltrate. No pneumothorax or pleural effusion. Cardiac size within normal limits. Right internal jugular chest port tip seen with superior cavoatrial junction. Pulmonary vascularity is normal. No acute bone abnormality. IMPRESSION: 1. No radiographic evidence of acute cardiopulmonary disease. 2. COPD. 3. Stable right suprahilar nodule. Electronically Signed   By: Fidela Salisbury M.D.   On: 12/04/2022 03:10   CT Angio Chest PE W/Cm &/Or Wo Cm  Result Date: 11/27/2022 CLINICAL DATA:  Shortness of breath, hypoxia, high clinical suspicion for pulmonary embolism EXAM: CT ANGIOGRAPHY CHEST WITH CONTRAST TECHNIQUE: Multidetector CT imaging of the chest was performed using the standard protocol during bolus administration of intravenous contrast. Multiplanar CT image reconstructions and MIPs were obtained to evaluate the vascular anatomy. RADIATION DOSE REDUCTION: This exam was performed according to the departmental dose-optimization program which includes automated exposure control, adjustment of the mA and/or kV according to patient size and/or use of iterative reconstruction technique. CONTRAST:  62m OMNIPAQUE IOHEXOL 350 MG/ML SOLN COMPARISON:  Chest radiograph done today and CT done on 11/06/2022 FINDINGS: Cardiovascular: There is homogeneous enhancement in thoracic aorta. There is ectasia of ascending thoracic aorta measuring 3.6 cm. There are no intraluminal filling defects in pulmonary artery branches. Coronary artery calcifications are seen. Mediastinum/Nodes: Subcentimeter nodes are  seen in mediastinum and hilar regions. Lungs/Pleura: Centrilobular and panlobular emphysema is seen. Blebs and bullae are seen in the apices. In image 53 of series 7, there is 1.4 cm nodular density in right upper lobe. Possible small metallic densities are seen in the medial margin of the lesion. New patchy infiltrates are seen in both lower lobes. There is peribronchial thickening. There is fluid density in the lumen of some of the bronchi. There is no pleural effusion or pneumothorax. Upper Abdomen: There are multiple low-density lesions of varying sizes in the visualized portions of liver largest measuring 4.3 cm in the left lobe consistent with hepatic metastatic disease. Musculoskeletal: There is a proximally 50% decrease in height of body T9 vertebra. Deformities are seen in the right eighth, ninth and tenth ribs, possibly old fractures. Review of the MIP images confirms the above findings. IMPRESSION: There is 1.4 cm nodular density in right upper lobe which may suggest primary or metastatic malignant neoplasm or focal pneumonia. This finding has not changed significantly. New patchy infiltrates are seen in both lower lobes suggesting multifocal pneumonia. There is no evidence of pulmonary artery embolism. There is no evidence of thoracic aortic dissection. Coronary artery disease. There is compression fracture in the upper endplate of body of T9 vertebra with approximately 50% decrease in height suggesting recent fracture due to trauma or skeletal metastatic disease. If clinically warranted, radionuclide bone scan may be considered. There are multiple space-occupying lesions in liver consistent with hepatic metastatic disease. Electronically Signed   By: PElmer PickerM.D.   On: 12/04/2022 17:23   DG Chest Port 1 View  Result Date: 12/09/2022 CLINICAL DATA:  Shortness of breath. History of metastatic small cell lung cancer. EXAM: PORTABLE CHEST 1 VIEW COMPARISON:  April 07, 2022. FINDINGS: The  heart size and mediastinal contours are within normal limits. Right internal jugular Port-A-Cath is unchanged. Tracheostomy tube has been removed. Hyperexpansion of the lungs is noted with probable emphysematous disease. Nodular opacity is seen in right upper lobe consistent with malignancy or metastatic disease. The visualized skeletal structures are unremarkable. IMPRESSION: Hyperexpansion of the lungs is noted with emphysematous disease. Nodular opacity is noted in right upper lobe consistent with malignancy or metastatic disease. Aortic Atherosclerosis (ICD10-I70.0) and Emphysema (ICD10-J43.9). Electronically Signed   By: Marijo Conception M.D.   On: 12/10/2022 14:57   IR PATIENT EVAL TECH 0-60 MINS  Result Date: 12/25/2022 Wolfgang Phoenix     12/21/2022  4:22 PM Please refer to Rowe Robert PA-C note below.  The patient's gtube removal site was evaluated by Rowe Robert. The dressing was changed. He was escorted to the ED in his wheelchair Pt presented to IR dept today unaccompanied for G tube site check. Hx met small cell lung ca being followed by Dr. Julien Nordmann. Pt reports that current care regimen to G tube incl lidocaine jelly, topical zinc has improved appearance of site so recommended he cont with this regimen for now. If worsening occurs rec f/u with wound care nurse. Pt's vitals today incl BP 118/102, O2 sats in upper 80'S on RA, 90% on 2 liters [place]. HR 130-140. Pt afebrile; denies CP but does have have sl more dyspnea with exertion than usual, occ cough, continued weakness; no N/V or bleeding. Above d/w Dr. Worthy Flank staff and they recommend further evaluation of pt in ED. Pt notified of plans.   CT ABDOMEN PELVIS W CONTRAST  Result Date: 11/24/2022 CLINICAL DATA:  Postoperative abdominal pain, question intracutaneous fistula or complication. History of feeding tube removal 3 weeks ago with bleeding at site. EXAM: CT ABDOMEN AND PELVIS WITH CONTRAST TECHNIQUE: Multidetector CT imaging of the  abdomen and pelvis was performed using the standard protocol following bolus administration of intravenous contrast. RADIATION DOSE REDUCTION: This exam was performed according to the departmental dose-optimization program which includes automated exposure control, adjustment of the mA and/or kV according to patient size and/or use of iterative reconstruction technique. CONTRAST:  141m OMNIPAQUE IOHEXOL 300 MG/ML  SOLN COMPARISON:  Eighteen days ago FINDINGS: Lower chest: Emphysema in minimally covered scarring in the lower lobes. No acute finding Hepatobiliary: Scattered hepatic metastases as recently staged. The largest lesion in the left lobe liver measures 4.2 cm on today's postcontrast imaging. No superimposed acute finding.No evidence of biliary obstruction or stone. Pancreas: Unremarkable. Spleen: Unremarkable. Adrenals/Urinary Tract: Negative adrenals. No hydronephrosis or stone. The bladder is collapsed around a Foley catheter. Stomach/Bowel: Enhancing tract at site of prior percutaneous gastrostomy tube. No collection, inflammation, or visible cause of bleeding. Negative for bowel obstruction. Distal colonic diverticulosis. Vascular/Lymphatic: No acute vascular abnormality. Confluent atheromatous calcification of the aorta and its branches. Extensive atheromatous narrowing of the iliacs and superficial femoral arteries. No mass or adenopathy. Reproductive:No pathologic findings. Other: No ascites or pneumoperitoneum. Musculoskeletal: Left inferior pubic ramus fracture extending towards ischial tuberosity with periosteal reaction. Generalized osteopenia. IMPRESSION: 1. Expected appearance of abandoned percutaneous gastrostomy tube site. No abscess or inflammation. 2. Healing left inferior pubic ramus fracture. 3. Known hepatic metastatic disease. Electronically Signed   By: JJorje GuildM.D.   On: 11/24/2022 04:39    Microbiology Recent Results (from the past 240 hour(s))  Resp Panel by RT-PCR (Flu  A&B, Covid) Anterior Nasal Swab     Status: None  Collection Time: 11/29/2022  2:28 PM   Specimen: Anterior Nasal Swab  Result Value Ref Range Status   SARS Coronavirus 2 by RT PCR NEGATIVE NEGATIVE Final    Comment: (NOTE) SARS-CoV-2 target nucleic acids are NOT DETECTED.  The SARS-CoV-2 RNA is generally detectable in upper respiratory specimens during the acute phase of infection. The lowest concentration of SARS-CoV-2 viral copies this assay can detect is 138 copies/mL. A negative result does not preclude SARS-Cov-2 infection and should not be used as the sole basis for treatment or other patient management decisions. A negative result may occur with  improper specimen collection/handling, submission of specimen other than nasopharyngeal swab, presence of viral mutation(s) within the areas targeted by this assay, and inadequate number of viral copies(<138 copies/mL). A negative result must be combined with clinical observations, patient history, and epidemiological information. The expected result is Negative.  Fact Sheet for Patients:  EntrepreneurPulse.com.au  Fact Sheet for Healthcare Providers:  IncredibleEmployment.be  This test is no t yet approved or cleared by the [place] FDA and  has been authorized for detection and/or diagnosis of SARS-CoV-2 by FDA under an Emergency Use Authorization (EUA). This EUA will remain  in effect (meaning this test can be used) for the duration of the COVID-19 declaration under Section 564(b)(1) of the Act, 21 U.S.C.section 360bbb-3(b)(1), unless the authorization is terminated  or revoked sooner.       Influenza A by PCR NEGATIVE NEGATIVE Final   Influenza B by PCR NEGATIVE NEGATIVE Final    Comment: (NOTE) The Xpert Xpress SARS-CoV-2/FLU/RSV plus assay is intended as an aid in the diagnosis of influenza from Nasopharyngeal swab specimens and should not be used as a sole basis for treatment.  Nasal washings and aspirates are unacceptable for Xpert Xpress SARS-CoV-2/FLU/RSV testing.  Fact Sheet for Patients: EntrepreneurPulse.com.au  Fact Sheet for Healthcare Providers: IncredibleEmployment.be  This test is not yet approved or cleared by the [place] FDA and has been authorized for detection and/or diagnosis of SARS-CoV-2 by FDA under an Emergency Use Authorization (EUA). This EUA will remain in effect (meaning this test can be used) for the duration of the COVID-19 declaration under Section 564(b)(1) of the Act, 21 U.S.C. section 360bbb-3(b)(1), unless the authorization is terminated or revoked.  Performed at [facility], [place] [address], [place], [place] [address]   Culture, blood (routine x 2)     Status: None   Collection Time: 12/14/2022  2:28 PM   Specimen: BLOOD  Result Value Ref Range Status   Specimen Description   Final    BLOOD RIGHT ANTECUBITAL Performed at [place] [address] [place], [place] [address]    Special Requests   Final    BOTTLES DRAWN AEROBIC AND ANAEROBIC Blood Culture results may not be optimal due to an inadequate volume of blood received in culture bottles Performed at [place] [address] [place], [place] [address]    Culture   Final    NO GROWTH 5 DAYS Performed at [facility], [place] [address] [place], [place] [address]    Report Status December 23, 2022 FINAL  Final  Culture, blood (routine x 2)     Status: None   Collection Time: 12/10/2022  2:33 PM   Specimen: BLOOD  Result Value Ref Range Status   Specimen Description   Final    BLOOD PORTA CATH Performed at [place] [address], [place], [place] [address]  Special Requests   Final    BOTTLES DRAWN AEROBIC AND ANAEROBIC Blood Culture results may not be optimal due to an inadequate volume of blood received in culture bottles Performed  at [place] [address], [place], [place] [address]    Culture   Final    NO GROWTH 5 DAYS Performed at [facility], [place] [address] [place], [place] [address]    Report Status 2022-12-13 FINAL  Final  MRSA Next Gen by PCR, Nasal     Status: None   Collection Time: 12/02/22 12:47 AM   Specimen: Nasal Mucosa; Nasal Swab  Result Value Ref Range Status   MRSA by PCR Next Gen NOT DETECTED NOT DETECTED Final    Comment: (NOTE) The GeneXpert MRSA Assay (FDA approved for NASAL specimens only), is one component of a comprehensive MRSA colonization surveillance program. It is not intended to diagnose MRSA infection nor to guide or monitor treatment for MRSA infections. Test performance is not FDA approved in patients less than 55 years old. Performed at [facility], [place] [address], [place], [place] [address]     Lab Basic Metabolic Panel: Recent Labs  Lab 12/03/22 [phone] 12/04/22 0856 12/05/22 0746 12/05/22 1650 12-13-22 0422 12/13/2022 0558  NA 139 140 139  --   --  140  K 3.4* 5.0 4.1  --   --  3.7  CL 101 102 103  --   --  106  CO2 _0 --   --  28  GLUCOSE 91 113* 117*  --   --  101*  BUN 11 26* 26*  --   --  27*  CREATININE 0.51* 0.86 0.67  --   --  0.53*  CALCIUM 7.9* 8.5* 8.1*  --   --  8.1*  MG 1.8 3.2* 2.4 2.2 2.0  --   PHOS  --   --  2.1* 2.9 2.0*  --    Liver Function Tests: Recent Labs  Lab 12/05/22 0746  AST 87*  ALT 105*  ALKPHOS 74  BILITOT 0.6  PROT 5.6*  ALBUMIN 2.6*   No results for input(s): "LIPASE", "AMYLASE" in the last 168 hours. No results for input(s): "AMMONIA" in the last 168 hours. CBC: Recent Labs  Lab 12/03/22 0629 12/04/22 0856 12/05/22 0746 12/13/2022 0558  WBC 6.8 14.2* 7.5 5.9  NEUTROABS 5.9 13.4*  --   --   HGB 8.9* 9.8* 9.0* 8.0*  HCT 28.6* 32.0* 29.3* 26.5*  MCV 100.7* 101.3* 100.0 101.9*  PLT 156 225 196 179   Cardiac Enzymes: No results for input(s): "CKTOTAL",  "CKMB", "CKMBINDEX", "TROPONINI" in the last 168 hours. Sepsis Labs: Recent Labs  Lab 12/03/22 0629 12/04/22 0856 12/05/22 0746 December 13, 2022 0558  WBC 6.8 14.2* 7.5 5.9    Procedures/Operations    Geno Sydnor A Ketzia Guzek 12/09/2022, 9:24 AM

## 2022-12-28 NOTE — Progress Notes (Signed)
PT Cancellation Note  Patient Details Name: Mark Yu MRN: [id number] DOB: 10-28-1956   Cancelled Treatment:     Pt continues on vent.  Will follow and proceed with PT eval as pt condition allows.   Rion Schnitzer 2022/12/19, 6:57 AM

## 2022-12-28 NOTE — Progress Notes (Signed)
Daily Progress Note   Patient Name: Mark Yu       Date: Dec 23, 2022 DOB: 1956-12-23  Age: 67 y.o. MRN#: [id number] Attending Physician: Laurin Coder, MD Primary Care Physician: Jennette Dubin, NP Admit Date: 12/16/2022 Length of Stay: 4 days  Reason for Consultation/Follow-up: Establishing goals of care  Subjective:   CC: Patient intubated in ICU this morning though sedation weaned for attempted extubation.  Following up regarding complex medical decision making.  Subjective:  Review of EMR prior to seeing patient this morning.  Also able to discuss care with bedside RN for medical updates.  Able to present to bedside at 11 AM and family meeting.  Patient's Sister Mark Yu, at bedside.  Unfortunately patient's significant other, Mark Yu, still not feeling well so wanted to be called via speaker phone to discuss patient's care at this time.    Once Mark Yu on speaker phone with Mark Yu present at bedside, able to introduce myself again.  This provider has been having continual discussions with Mark Yu and [place].  Discussed medical plans moving forward.  At this time patient's sedation has been weaned for attempted extubation today.  Explained in detail to significant other and Mark Yu that patient has underlying disease, cancer, that is continuing to worsen.  Being on the ventilator is only tying the patient to the hospital and not allowing him to get back to the quality of life he so enjoyed. Mark Yu expressed her wish is for patient to come home to be able to die there.  We discussed getting TOC involved to assist with decisions regarding home hospice group support.  Was very honest with Mark Yu and Mark Yu that based on patient's respiratory status, it is likely that he will not make it home.  If there is a window where he can get home and have his symptoms manage, that would be the window to get him home with hospice.  Explained that once patient is extubated, it will be to nasal  cannula support and he will not be reintubated as again the ventilator is tying him to the hospital and not helping assist with his quality of life.  Once patient is extubated, will continue to focus on symptom management knowing he has a poor underlying respiratory status.  Will provide medications to help relax his breathing.  Patient's symptom burden may be so intense that he requires IV medications for management of his breathing which can only be done here in the hospital or at inpatient hospice.  Should patient's symptoms be manageable on oral medication, then consider getting home.  Explained to [place] that patient's respiratory status may quickly worsen after extubation and he may die here in the hospital. Everlena Cooper acknowledged hearing this and agreeing with plan moving forward.    All questions answered at that time offered emotional support as able.  Interacted with patient who is much more awake and able to nod head yes and no appropriately.  Explained plan about focusing on patient's comfort and not putting him back on the ventilator.  Patient agreeing with proceeding with this plan.  Shortly presented at bedside again to update Dr. Ander Slade regarding discussions with Mark Yu this morning.  Mark Yu was still at bedside.  Planning to extubate soon to nasal cannula support.  Again we will provide medications for patient's comfort so that he is not gasping for air knowing that he has a poor underlying respiratory status.  Presented to bedside shortly after patient's extubation to check on him.  Patient able to waved  to this provider after introductions.  Again able to update him on the plan to support his work of breathing with medications moving forward and making sure he is not uncomfortable.  He did have an increased work of breathing when seen at this time send noted would asked bedside RN to provide medications at this time.  Patient and Mark Yu agreeing with this plan.  Review of  Systems Increased work of breathing Objective:   Vital Signs:  BP (!) 145/85 (BP Location: Left Arm)   Yu (!) 103   Temp 99.3 F (37.4 C) (Axillary)   Resp 15   Ht 5\' 11"  (1.803 m)   Wt 47.6 kg   SpO2 96%   BMI 14.64 kg/m   Physical Exam: General: Intubated, awake, interactive, chronically ill-appearing, frail, cachectic Eyes: No drainage noted HENT: ETT in place Cardiovascular: RRR, no edema in LE b/l Respiratory: Intubated on ventilator support  Abdomen: not distended Extremities: Muscle wasting present Skin: Ecchymoses on upper extremities bilaterally Neuro: Intubated, able to answer questions by nodding head yes and no appropriately  Imaging: I personally reviewed recent imaging.   Assessment & Plan:   Assessment: Patient is a 67 year old male with a past medical history of metastatic small cell lung cancer to the liver on third line chemo, hypertension, paroxysmal A-fib, COPD, anxiety, and depression who was admitted on 12/16/2022 for worsening shortness of breath.  Progression of patient's cancer has been seen on recent imaging.  During this admission patient found to have acute hypoxic respiratory failure secondary to multifocal pneumonia in the setting of metastatic lung cancer; receiving appropriate management for this.  Patient had recent visit with oncology on 11/26/2022 discussing medical care moving forward to which patient elected to proceed with cancer directed therapies.  Patient seen by palliative medicine team previously.  Palliative medicine team consulted to assist with complex medical decision making and symptom management.   # Complex medical decision making/goals of care                -Extensive discussions with patient, Mark Yu, and significant other Mark Yu as described in detail above in HPI.  Plan to proceed with extubation today.  Patient will be extubated to nasal cannula support.  Patient will not be reintubated as it does not provide patient quality  of life and ties him to the hospital. Mark Yu's goal would be to get patient home with hospice so that he can pass away there.  Was honest with family that unsure patient can survive long enough to make at home based on the work of breathing he has been having.  Once patient is extubated, will provide medications to help relax breathing and make sure he is comfortable.  Should there be a window where it looks like patient is stable for transfer home, could consider transfer home with hospice.  Noted would place [facility] consult to talk to [place] about home hospice choice in case patient had window to get home.  Noted patient would have to be transferred home via ambulance.     Code Status: DNR   # Symptom management           -Appreciate primary team's assistance.  Patient receiving medication to make sure patient is comfortable as has had increased work of breathing.  # Psycho-social/Spiritual Support:  - Support System: Significant other of 37 years, sisters  Discussed with: Patient's sister, bedside RN, significant other Mark Yu,, primary PCCM provider  Thank you for allowing the palliative care team to participate  in the care Faustino Congress.  Chelsea Aus, DO Palliative Care Provider PMT # [phone]  If patient remains symptomatic despite maximum doses, please call PMT at [phone] between 0700 and 1900. Outside of these hours, please call attending, as PMT does not have night coverage.  This provider spent a total of 65 minutes providing patient's care.  Includes review of EMR, discussing care with other staff members involved in patient's medical care, obtaining relevant history and information from patient and/or patient's family, and personal review of imaging and lab work. Greater than 50% of the time was spent counseling and coordinating care related to the above assessment and plan.

## 2022-12-28 NOTE — Progress Notes (Signed)
Sister [name] at bedside. Wishes to defer funeral plans to patient girlfriend at this time. Wishes that clothing and cane be sent with body to funeral home. Two gold rings removed from patient and given to sister in secure container.

## 2022-12-28 NOTE — Progress Notes (Signed)
Pharmacy: Electrolytes K 3.7 Phos 2  Plan: Kphos 15 mM & 2 runs KCL per Elink protocol  Eudelia Bunch, Pharm.D Use secure chat for questions 12/11/22 10:20 AM

## 2022-12-28 NOTE — Progress Notes (Signed)
  Progress Note   Date: 12/07/2022  Patient Name: Mark Yu        MRN#: [id number]  Clarification of diagnosis:  severe protein calorie malnutrition

## 2022-12-28 NOTE — Progress Notes (Signed)
2115 No heart rate noted on monitor. Entered room and saw no respirations or movements. Two other RN at bedside. Patient pronounced dead at this time. Sister [name] notified. PCCM nurse and MD aware. [place] notified at this time. Post mortem care complete.

## 2022-12-28 NOTE — Procedures (Signed)
Extubation Procedure Note  Patient Details:   Name: Mark Yu DOB: 11-19-1956 MRN: [id number]   Airway Documentation:    Vent end date: December 17, 2022 Vent end time: 1200   Evaluation  O2 sats: stable throughout Complications: No apparent complications Patient did tolerate procedure well. Bilateral Breath Sounds: Diminished, Clear   Yes  Baldwin [place] Nannette 12-17-2022, 12:02 PM  *Positive cuff leak pre extubation. *2 lpm nasal cannula post extubation.

## 2022-12-28 NOTE — Progress Notes (Signed)
eLink Physician-Brief Progress Note Patient Name: Mark Yu DOB: 1956-03-11 MRN: [id number]   Date of Service  12/20/22  HPI/Events of Note  Notified of patient's time of death.  Pt was comfort care.   eICU Interventions  Time of death Mar 20, 2114, 12/20/22.     Intervention Category Minor Interventions: Other:  Elsie Lincoln 20-Dec-2022, 9:38 PM

## 2022-12-28 DEATH — deceased

## 2022-12-30 ENCOUNTER — Ambulatory Visit: Payer: Medicare Other | Admitting: Physician Assistant

## 2022-12-30 ENCOUNTER — Ambulatory Visit: Payer: Medicare Other

## 2022-12-30 ENCOUNTER — Other Ambulatory Visit: Payer: Medicare Other

## 2023-01-05 ENCOUNTER — Other Ambulatory Visit: Payer: Medicare Other

## 2023-01-05 ENCOUNTER — Ambulatory Visit: Payer: Medicare Other | Admitting: Physician Assistant

## 2023-01-05 ENCOUNTER — Ambulatory Visit: Payer: Medicare Other

## 2023-01-07 ENCOUNTER — Ambulatory Visit: Payer: Medicare Other

## 2023-01-07 ENCOUNTER — Ambulatory Visit: Payer: Medicare Other | Admitting: Physician Assistant

## 2023-01-07 ENCOUNTER — Other Ambulatory Visit: Payer: Medicare Other

## 2023-01-12 ENCOUNTER — Ambulatory Visit: Payer: Medicare Other

## 2023-01-12 ENCOUNTER — Other Ambulatory Visit: Payer: Medicare Other

## 2023-01-28 ENCOUNTER — Ambulatory Visit: Payer: Medicare Other

## 2023-01-28 ENCOUNTER — Other Ambulatory Visit: Payer: Medicare Other

## 2023-01-28 ENCOUNTER — Ambulatory Visit: Payer: Medicare Other | Admitting: Internal Medicine

## 2023-02-04 ENCOUNTER — Other Ambulatory Visit: Payer: Medicare Other

## 2023-02-04 ENCOUNTER — Ambulatory Visit: Payer: Medicare Other | Admitting: Physician Assistant

## 2023-02-04 ENCOUNTER — Ambulatory Visit: Payer: Medicare Other

## 2023-09-07 IMAGING — DX DG CHEST 1V PORT
2 series · 2 of 2 positions shown · non-contrast
Comparison: January 06, 2022.

CLINICAL DATA: Status post bronchoscopy.

EXAM:
PORTABLE CHEST 1 VIEW

[chest ap (1 of 2)]
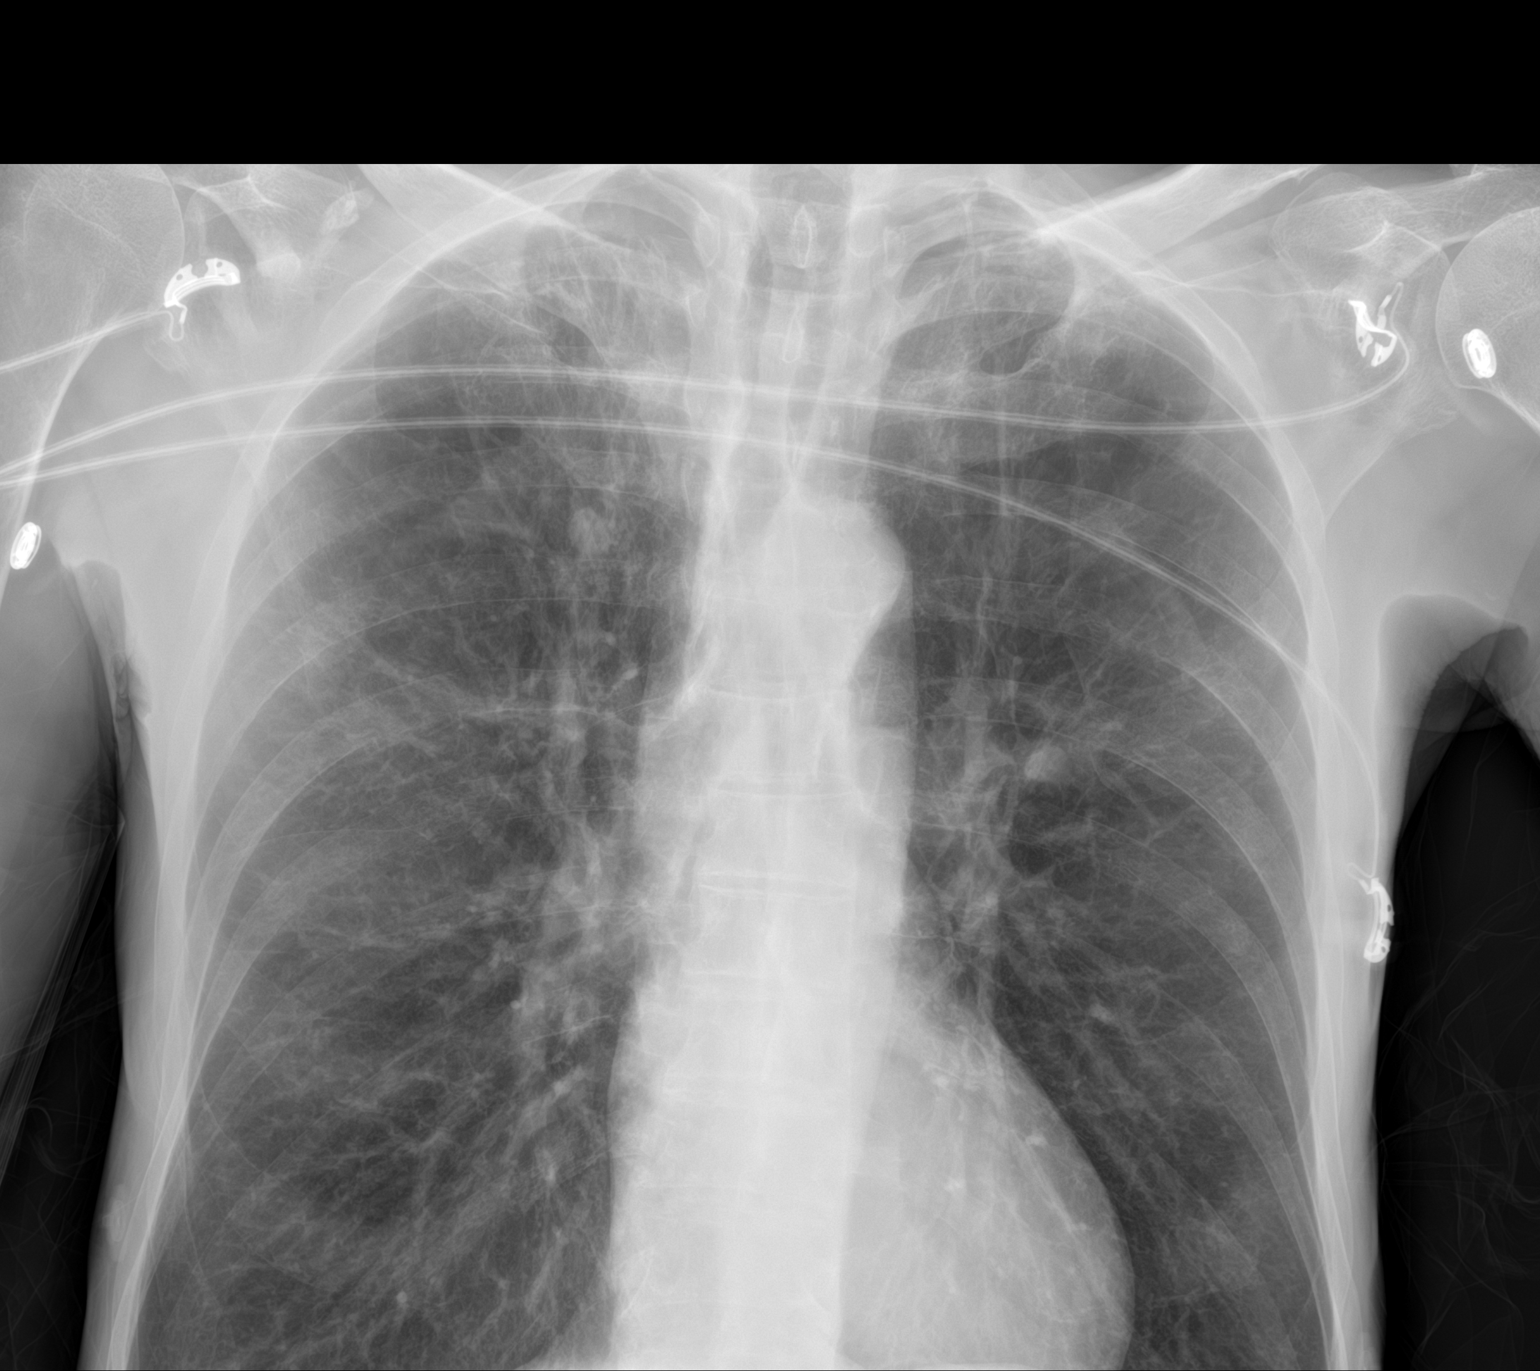

[chest ap (2 of 2)]
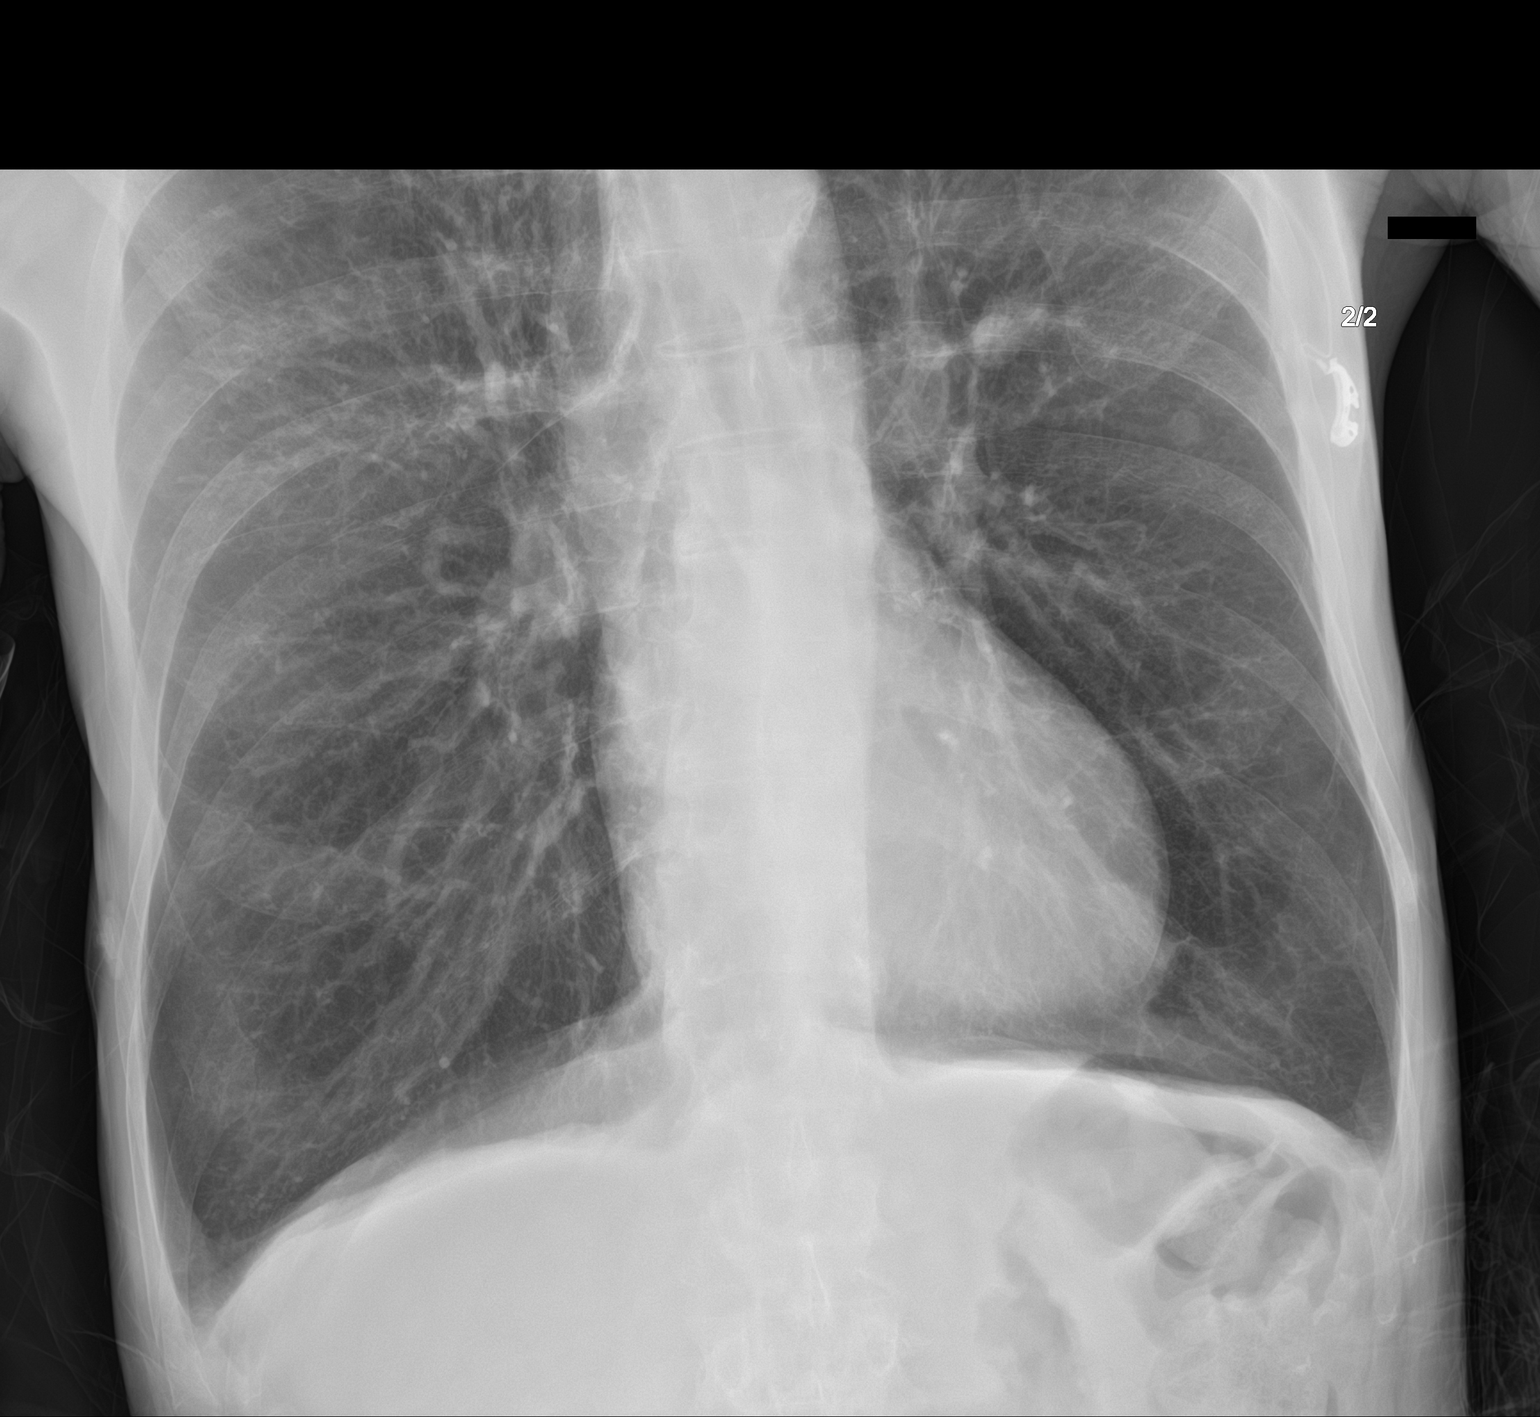

[2 of 2 positions shown; findings below may reference images not displayed]

FINDINGS: The heart size and mediastinal contours are within normal limits.
Hyperexpansion of the lungs is noted. No pneumothorax or pleural
effusion is noted. Bilateral pulmonary nodules are again noted. The
visualized skeletal structures are unremarkable.
IMPRESSION: No pneumothorax is noted. Hyperexpansion of the lungs is noted.
Bilateral pulmonary nodules are again noted.

## 2023-10-27 IMAGING — DX DG CHEST 1V PORT
2 series · 2 of 2 positions shown · non-contrast
Comparison: 03/21/2022

CLINICAL DATA: Acute respiratory failure with hypoxemia.

EXAM:
PORTABLE CHEST 1 VIEW

[chest ap (1 of 2)]
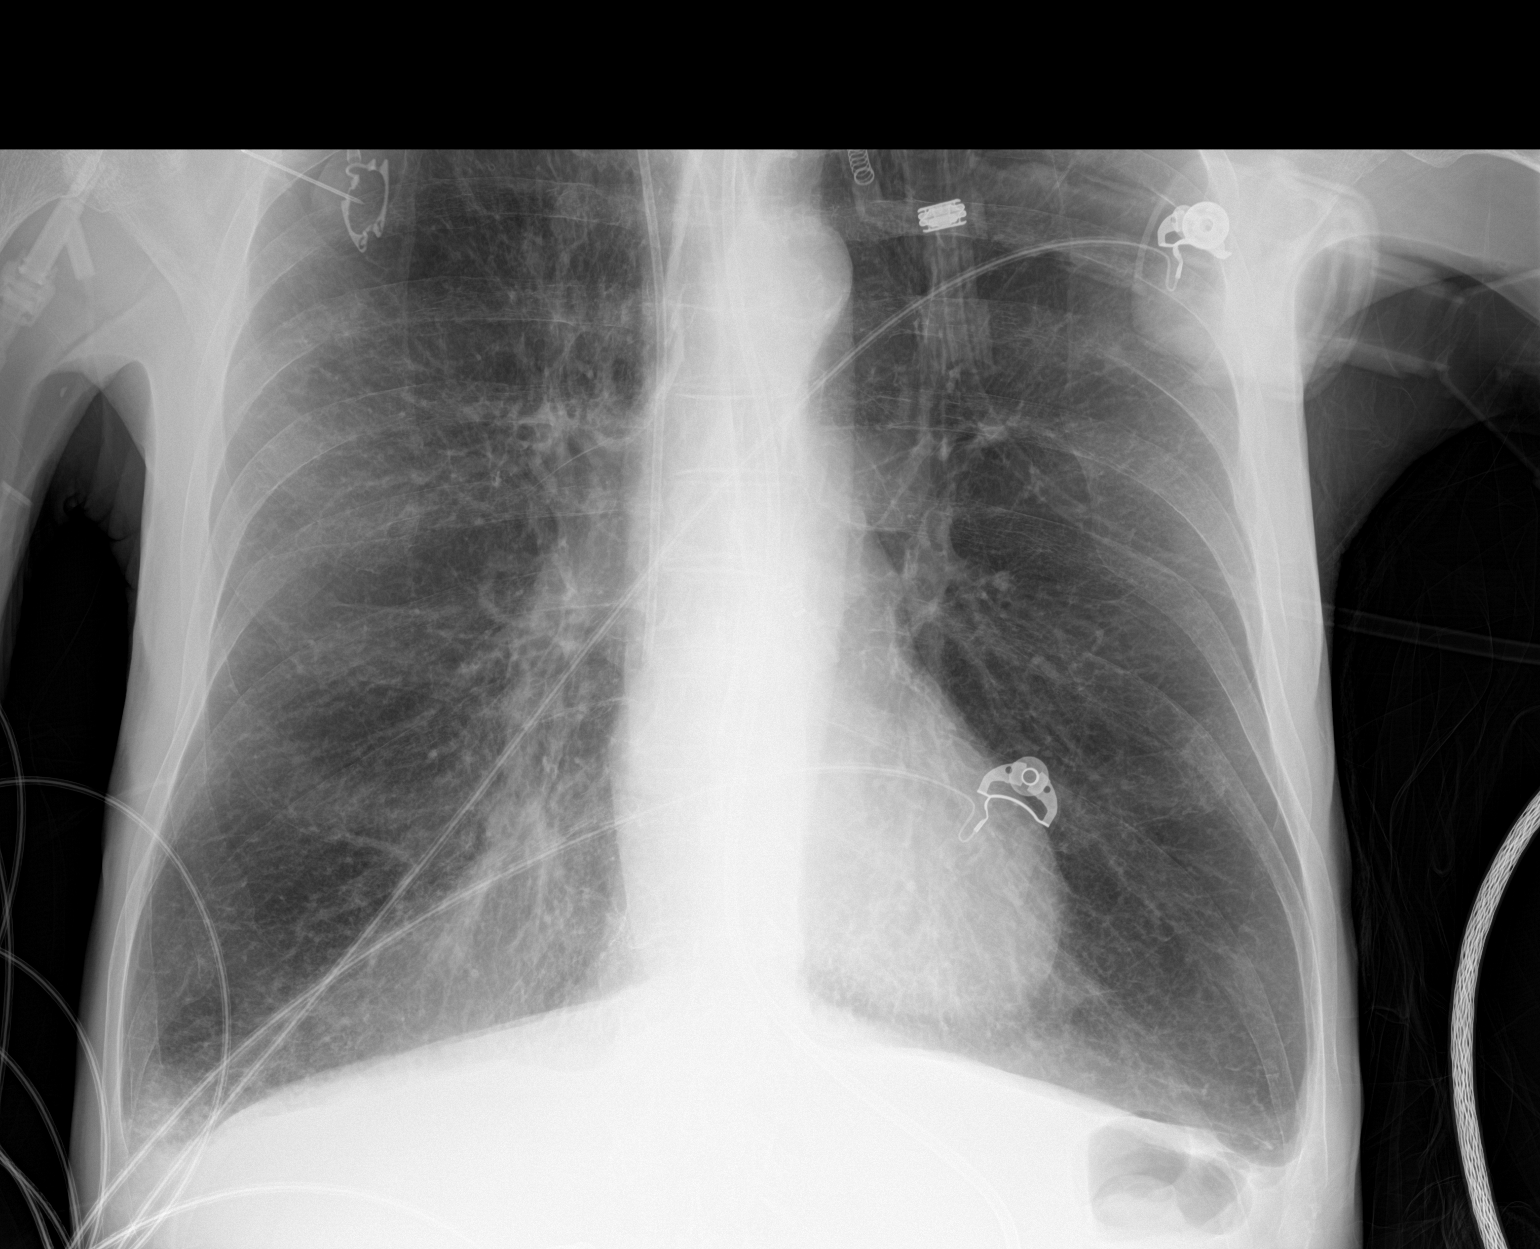

[chest ap (2 of 2)]
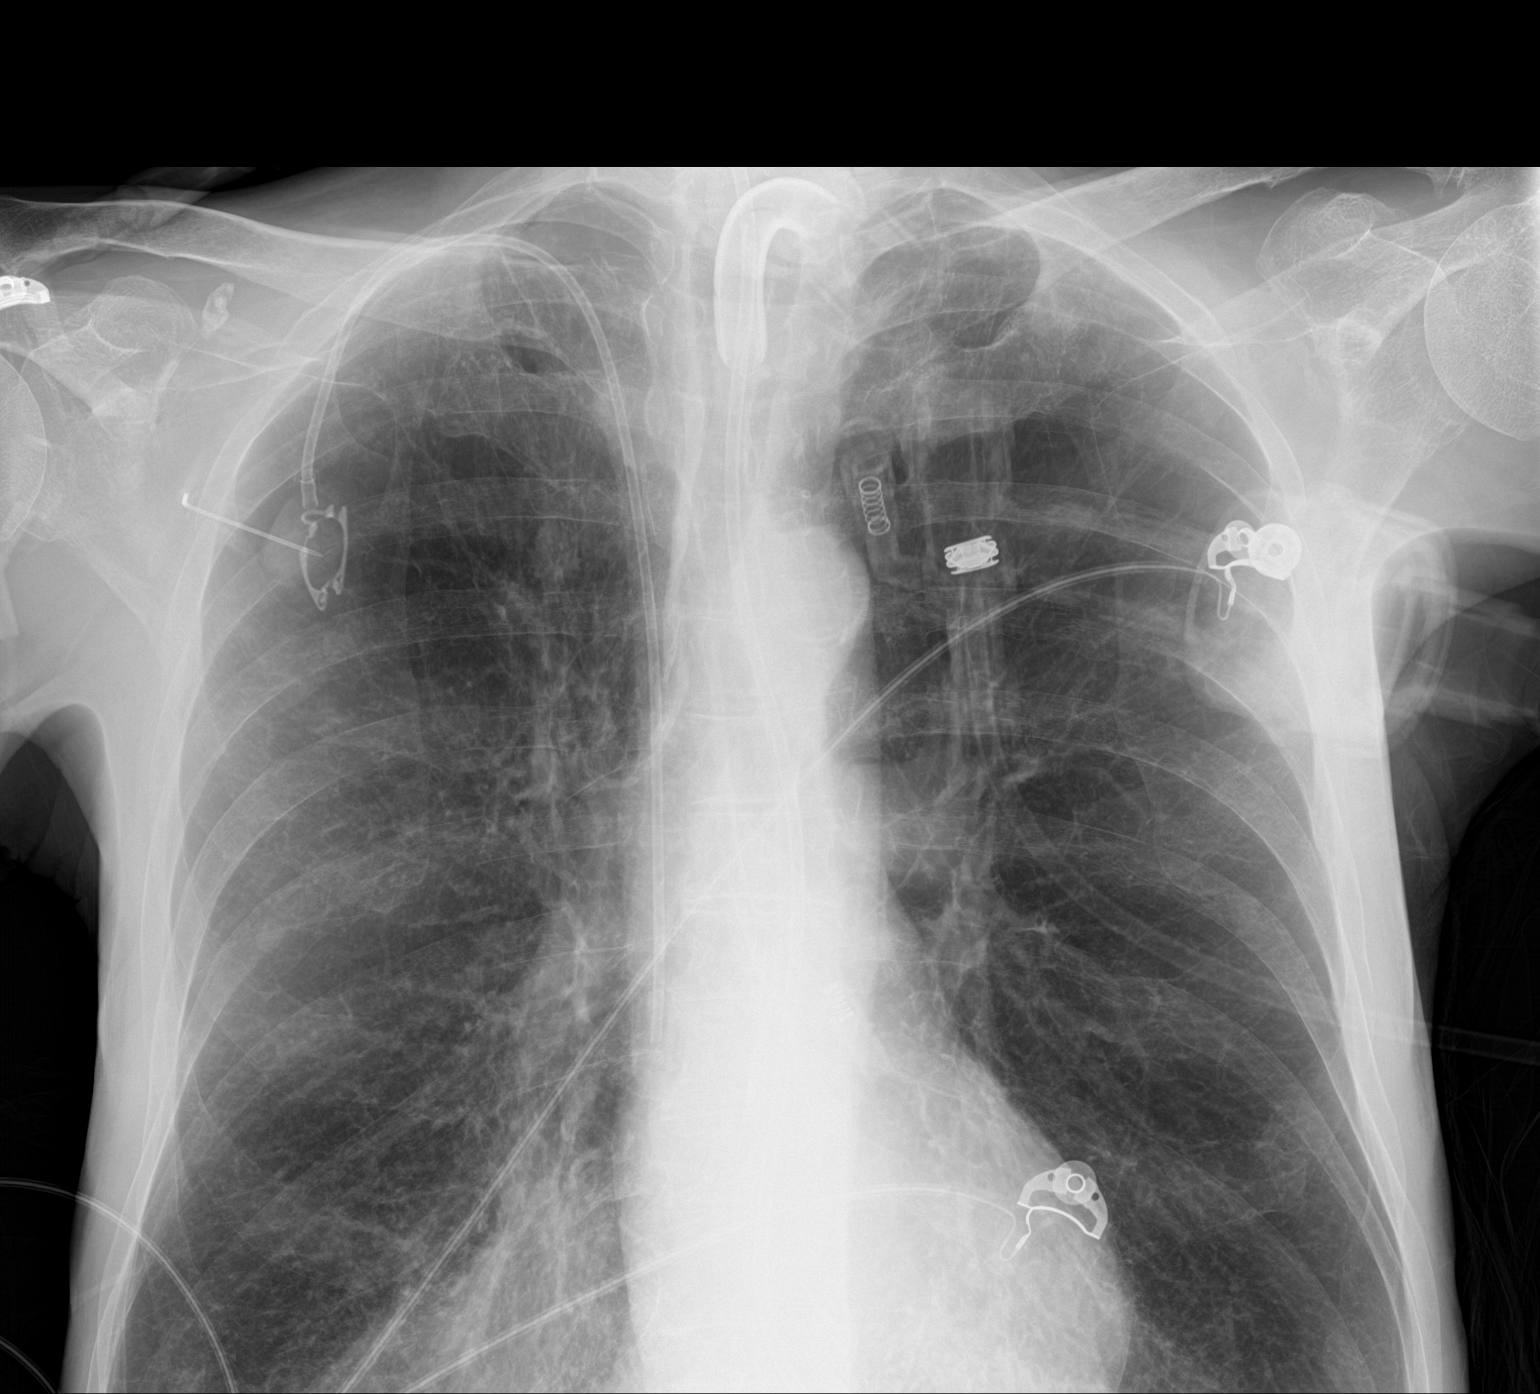

[2 of 2 positions shown; findings below may reference images not displayed]

FINDINGS: The tracheostomy tube, feeding tube and right-sided Port-A-Cath are
stable.

Stable emphysematous changes and pulmonary scarring.

Improving right basilar aeration suggesting resolving pneumonia.
IMPRESSION: 1. Stable support apparatus.
2. Improving right basilar lung aeration.

## 2023-11-10 IMAGING — DX DG CHEST 1V PORT
1 series · 1 of 1 positions shown · non-contrast
Comparison: One-view chest x-ray 03/28/2022

CLINICAL DATA: Shortness of breath.

EXAM:
PORTABLE CHEST 1 VIEW

[chest ap]
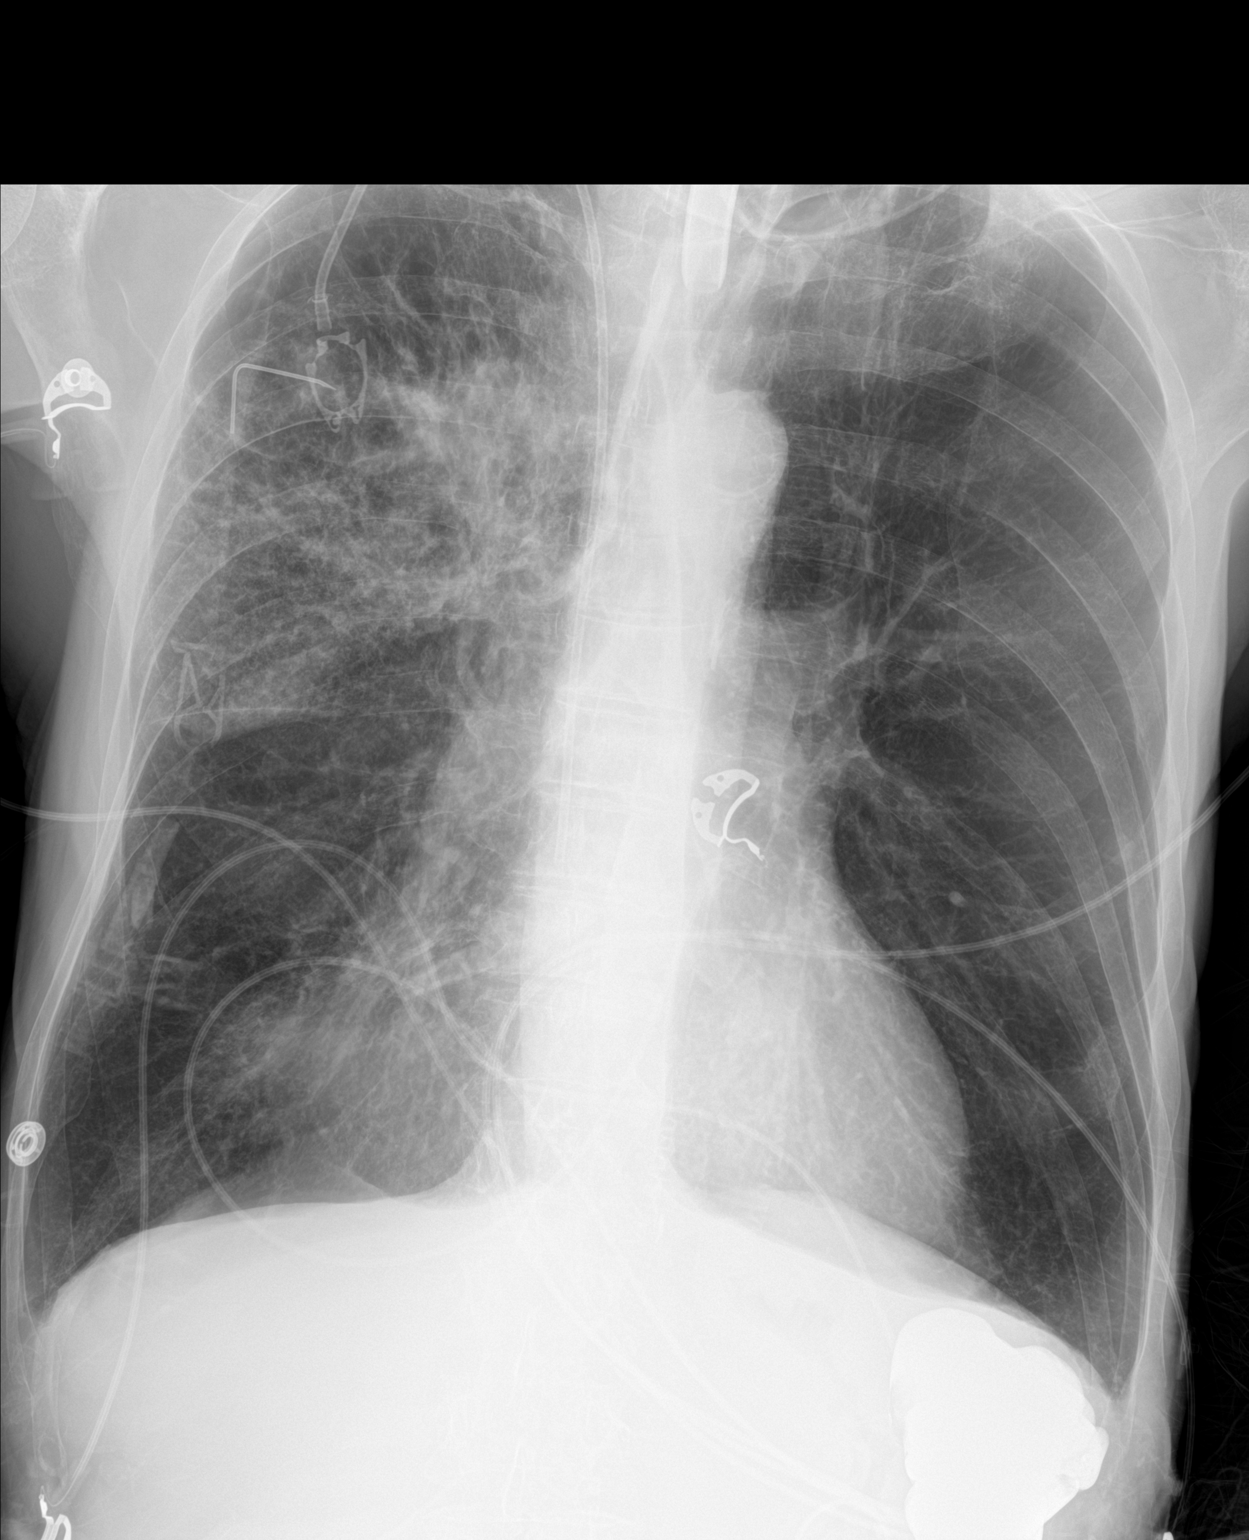

[1 of 1 positions shown; findings below may reference images not displayed]

FINDINGS: Heart size is normal. Right IJ Port-A-Cath is accessed and stable.
Tracheostomy tube is in place. Emphysematous changes again noted.

New right upper lobe pneumonia present.
IMPRESSION: 1. New right upper lobe pneumonia.
2. Emphysema.
